# Patient Record
Sex: Female | Born: 1960 | Race: White | Hispanic: No | Marital: Married | Smoking: Never smoker
Health system: Southern US, Community
[De-identification: ages and names within clinical notes are randomized; demographics above are authoritative.]

## PROBLEM LIST (undated history)

## (undated) DIAGNOSIS — G4733 Obstructive sleep apnea (adult) (pediatric): Secondary | ICD-10-CM

## (undated) DIAGNOSIS — F988 Other specified behavioral and emotional disorders with onset usually occurring in childhood and adolescence: Secondary | ICD-10-CM

## (undated) DIAGNOSIS — I10 Essential (primary) hypertension: Secondary | ICD-10-CM

## (undated) DIAGNOSIS — G4730 Sleep apnea, unspecified: Secondary | ICD-10-CM

## (undated) DIAGNOSIS — D509 Iron deficiency anemia, unspecified: Secondary | ICD-10-CM

## (undated) DIAGNOSIS — N823 Fistula of vagina to large intestine: Secondary | ICD-10-CM

## (undated) DIAGNOSIS — J189 Pneumonia, unspecified organism: Secondary | ICD-10-CM

## (undated) DIAGNOSIS — Z9989 Dependence on other enabling machines and devices: Secondary | ICD-10-CM

## (undated) DIAGNOSIS — E785 Hyperlipidemia, unspecified: Secondary | ICD-10-CM

## (undated) DIAGNOSIS — E119 Type 2 diabetes mellitus without complications: Secondary | ICD-10-CM

## (undated) DIAGNOSIS — F430 Acute stress reaction: Secondary | ICD-10-CM

## (undated) DIAGNOSIS — K5790 Diverticulosis of intestine, part unspecified, without perforation or abscess without bleeding: Secondary | ICD-10-CM

## (undated) DIAGNOSIS — Z8742 Personal history of other diseases of the female genital tract: Secondary | ICD-10-CM

## (undated) DIAGNOSIS — R112 Nausea with vomiting, unspecified: Secondary | ICD-10-CM

## (undated) DIAGNOSIS — D219 Benign neoplasm of connective and other soft tissue, unspecified: Secondary | ICD-10-CM

## (undated) DIAGNOSIS — Z9889 Other specified postprocedural states: Secondary | ICD-10-CM

## (undated) DIAGNOSIS — D259 Leiomyoma of uterus, unspecified: Secondary | ICD-10-CM

## (undated) DIAGNOSIS — R030 Elevated blood-pressure reading, without diagnosis of hypertension: Secondary | ICD-10-CM

## (undated) DIAGNOSIS — K5900 Constipation, unspecified: Secondary | ICD-10-CM

## (undated) DIAGNOSIS — M2550 Pain in unspecified joint: Secondary | ICD-10-CM

## (undated) DIAGNOSIS — F419 Anxiety disorder, unspecified: Secondary | ICD-10-CM

## (undated) DIAGNOSIS — K219 Gastro-esophageal reflux disease without esophagitis: Secondary | ICD-10-CM

## (undated) DIAGNOSIS — R739 Hyperglycemia, unspecified: Secondary | ICD-10-CM

## (undated) DIAGNOSIS — E669 Obesity, unspecified: Secondary | ICD-10-CM

## (undated) DIAGNOSIS — M722 Plantar fascial fibromatosis: Secondary | ICD-10-CM

## (undated) DIAGNOSIS — R682 Dry mouth, unspecified: Secondary | ICD-10-CM

## (undated) DIAGNOSIS — N736 Female pelvic peritoneal adhesions (postinfective): Secondary | ICD-10-CM

## (undated) DIAGNOSIS — F458 Other somatoform disorders: Secondary | ICD-10-CM

## (undated) DIAGNOSIS — N83202 Unspecified ovarian cyst, left side: Secondary | ICD-10-CM

## (undated) DIAGNOSIS — N83201 Unspecified ovarian cyst, right side: Secondary | ICD-10-CM

## (undated) DIAGNOSIS — R7303 Prediabetes: Secondary | ICD-10-CM

## (undated) DIAGNOSIS — M549 Dorsalgia, unspecified: Secondary | ICD-10-CM

## (undated) DIAGNOSIS — N950 Postmenopausal bleeding: Secondary | ICD-10-CM

## (undated) DIAGNOSIS — N920 Excessive and frequent menstruation with regular cycle: Secondary | ICD-10-CM

## (undated) DIAGNOSIS — Z973 Presence of spectacles and contact lenses: Secondary | ICD-10-CM

## (undated) HISTORY — DX: Pain in unspecified joint: M25.50

## (undated) HISTORY — DX: Elevated blood-pressure reading, without diagnosis of hypertension: R03.0

## (undated) HISTORY — PX: HEMORRHOID BANDING: SHX5850

## (undated) HISTORY — DX: Unspecified ovarian cyst, right side: N83.202

## (undated) HISTORY — DX: Gastro-esophageal reflux disease without esophagitis: K21.9

## (undated) HISTORY — DX: Acute stress reaction: F43.0

## (undated) HISTORY — DX: Hyperlipidemia, unspecified: E78.5

## (undated) HISTORY — PX: SEPTOPLASTY: SUR1290

## (undated) HISTORY — PX: HEMORRHOID SURGERY: SHX153

## (undated) HISTORY — DX: Excessive and frequent menstruation with regular cycle: N92.0

## (undated) HISTORY — DX: Hyperglycemia, unspecified: R73.9

## (undated) HISTORY — DX: Plantar fascial fibromatosis: M72.2

## (undated) HISTORY — DX: Dorsalgia, unspecified: M54.9

## (undated) HISTORY — DX: Obesity, unspecified: E66.9

## (undated) HISTORY — DX: Constipation, unspecified: K59.00

## (undated) HISTORY — DX: Anxiety disorder, unspecified: F41.9

## (undated) HISTORY — DX: Other somatoform disorders: F45.8

## (undated) HISTORY — DX: Leiomyoma of uterus, unspecified: D25.9

## (undated) HISTORY — DX: Other specified behavioral and emotional disorders with onset usually occurring in childhood and adolescence: F98.8

## (undated) HISTORY — PX: UTERINE SUSPENSION: SUR1430

## (undated) HISTORY — DX: Sleep apnea, unspecified: G47.30

## (undated) HISTORY — DX: Type 2 diabetes mellitus without complications: E11.9

## (undated) HISTORY — DX: Obstructive sleep apnea (adult) (pediatric): G47.33

## (undated) HISTORY — PX: WISDOM TOOTH EXTRACTION: SHX21

## (undated) HISTORY — PX: OTHER SURGICAL HISTORY: SHX169

## (undated) HISTORY — DX: Dry mouth, unspecified: R68.2

## (undated) HISTORY — DX: Female pelvic peritoneal adhesions (postinfective): N73.6

## (undated) HISTORY — PX: LAPAROSCOPY: SHX197

## (undated) HISTORY — DX: Dependence on other enabling machines and devices: Z99.89

## (undated) HISTORY — DX: Unspecified ovarian cyst, right side: N83.201

## (undated) HISTORY — PX: TONSILLECTOMY AND ADENOIDECTOMY: SUR1326

## (undated) HISTORY — PX: TRIGGER FINGER RELEASE: SHX641

---

## 1998-07-19 ENCOUNTER — Other Ambulatory Visit: Admission: RE | Admit: 1998-07-19 | Discharge: 1998-07-19 | Payer: Self-pay | Admitting: *Deleted

## 2004-01-13 ENCOUNTER — Emergency Department (HOSPITAL_COMMUNITY): Admission: EM | Admit: 2004-01-13 | Discharge: 2004-01-13 | Payer: Self-pay | Admitting: Emergency Medicine

## 2004-01-18 ENCOUNTER — Encounter: Admission: RE | Admit: 2004-01-18 | Discharge: 2004-01-18 | Payer: Self-pay | Admitting: Sports Medicine

## 2004-09-12 ENCOUNTER — Ambulatory Visit: Payer: Self-pay | Admitting: Family Medicine

## 2004-10-24 ENCOUNTER — Ambulatory Visit: Payer: Self-pay | Admitting: Family Medicine

## 2004-11-28 ENCOUNTER — Ambulatory Visit: Payer: Self-pay | Admitting: Family Medicine

## 2005-01-23 ENCOUNTER — Ambulatory Visit: Payer: Self-pay | Admitting: Family Medicine

## 2005-05-14 ENCOUNTER — Ambulatory Visit: Payer: Self-pay | Admitting: Family Medicine

## 2005-09-04 ENCOUNTER — Ambulatory Visit: Payer: Self-pay | Admitting: Internal Medicine

## 2005-09-18 ENCOUNTER — Ambulatory Visit: Payer: Self-pay | Admitting: Family Medicine

## 2006-03-05 ENCOUNTER — Ambulatory Visit: Payer: Self-pay | Admitting: Family Medicine

## 2006-04-16 ENCOUNTER — Ambulatory Visit: Payer: Self-pay | Admitting: Family Medicine

## 2006-07-16 ENCOUNTER — Ambulatory Visit: Payer: Self-pay | Admitting: Family Medicine

## 2006-08-27 ENCOUNTER — Ambulatory Visit: Payer: Self-pay | Admitting: Pulmonary Disease

## 2006-09-23 ENCOUNTER — Ambulatory Visit (HOSPITAL_BASED_OUTPATIENT_CLINIC_OR_DEPARTMENT_OTHER): Admission: RE | Admit: 2006-09-23 | Discharge: 2006-09-23 | Payer: Self-pay | Admitting: Pulmonary Disease

## 2006-09-28 ENCOUNTER — Ambulatory Visit: Payer: Self-pay | Admitting: Pulmonary Disease

## 2006-10-08 ENCOUNTER — Ambulatory Visit: Payer: Self-pay | Admitting: Pulmonary Disease

## 2006-10-14 ENCOUNTER — Telehealth (INDEPENDENT_AMBULATORY_CARE_PROVIDER_SITE_OTHER): Payer: Self-pay | Admitting: *Deleted

## 2006-11-05 ENCOUNTER — Ambulatory Visit: Payer: Self-pay | Admitting: Pulmonary Disease

## 2006-12-10 DIAGNOSIS — F988 Other specified behavioral and emotional disorders with onset usually occurring in childhood and adolescence: Secondary | ICD-10-CM

## 2006-12-10 HISTORY — DX: Other specified behavioral and emotional disorders with onset usually occurring in childhood and adolescence: F98.8

## 2007-01-21 ENCOUNTER — Encounter: Payer: Self-pay | Admitting: Family Medicine

## 2007-01-27 ENCOUNTER — Encounter: Payer: Self-pay | Admitting: Family Medicine

## 2007-01-28 ENCOUNTER — Telehealth (INDEPENDENT_AMBULATORY_CARE_PROVIDER_SITE_OTHER): Payer: Self-pay | Admitting: *Deleted

## 2007-02-09 ENCOUNTER — Encounter (INDEPENDENT_AMBULATORY_CARE_PROVIDER_SITE_OTHER): Payer: Self-pay | Admitting: *Deleted

## 2007-03-28 ENCOUNTER — Telehealth (INDEPENDENT_AMBULATORY_CARE_PROVIDER_SITE_OTHER): Payer: Self-pay | Admitting: *Deleted

## 2007-03-29 DIAGNOSIS — J4520 Mild intermittent asthma, uncomplicated: Secondary | ICD-10-CM | POA: Insufficient documentation

## 2007-03-29 DIAGNOSIS — J45909 Unspecified asthma, uncomplicated: Secondary | ICD-10-CM

## 2007-03-29 DIAGNOSIS — N809 Endometriosis, unspecified: Secondary | ICD-10-CM | POA: Insufficient documentation

## 2007-08-17 ENCOUNTER — Telehealth: Payer: Self-pay | Admitting: Family Medicine

## 2007-09-09 ENCOUNTER — Telehealth: Payer: Self-pay | Admitting: Family Medicine

## 2007-10-07 ENCOUNTER — Telehealth: Payer: Self-pay | Admitting: Family Medicine

## 2007-10-11 ENCOUNTER — Encounter: Payer: Self-pay | Admitting: Family Medicine

## 2007-10-19 ENCOUNTER — Encounter (INDEPENDENT_AMBULATORY_CARE_PROVIDER_SITE_OTHER): Payer: Self-pay | Admitting: *Deleted

## 2007-11-18 ENCOUNTER — Ambulatory Visit: Payer: Self-pay | Admitting: Family Medicine

## 2007-11-18 DIAGNOSIS — E6609 Other obesity due to excess calories: Secondary | ICD-10-CM | POA: Insufficient documentation

## 2007-11-18 DIAGNOSIS — E669 Obesity, unspecified: Secondary | ICD-10-CM | POA: Insufficient documentation

## 2008-02-07 ENCOUNTER — Encounter: Payer: Self-pay | Admitting: Family Medicine

## 2008-02-21 ENCOUNTER — Ambulatory Visit: Payer: Self-pay | Admitting: Family Medicine

## 2008-03-02 ENCOUNTER — Ambulatory Visit: Payer: Self-pay | Admitting: Family Medicine

## 2008-03-02 DIAGNOSIS — I10 Essential (primary) hypertension: Secondary | ICD-10-CM | POA: Insufficient documentation

## 2008-03-05 LAB — CONVERTED CEMR LAB
Alkaline Phosphatase: 59 units/L (ref 39–117)
BUN: 10 mg/dL (ref 6–23)
Basophils Absolute: 0 10*3/uL (ref 0.0–0.1)
Chloride: 105 meq/L (ref 96–112)
Eosinophils Relative: 1.2 % (ref 0.0–5.0)
GFR calc Af Amer: 115 mL/min
HCT: 32.5 % — ABNORMAL LOW (ref 36.0–46.0)
HDL: 50.7 mg/dL (ref >39.0)
Hgb A1c MFr Bld: 6.5 % — ABNORMAL HIGH (ref 4.6–6.0)
MCV: 80.4 fL (ref 78.0–100.0)
Neutrophils Relative %: 64.2 % (ref 43.0–77.0)
Platelets: 347 10*3/uL (ref 150–400)
Potassium: 3.4 meq/L — ABNORMAL LOW (ref 3.5–5.1)
RBC: 4.04 M/uL (ref 3.87–5.11)
RDW: 15.5 % — ABNORMAL HIGH (ref 11.5–14.6)
Sodium: 142 meq/L (ref 135–145)
TSH: 0.99 microintl units/mL (ref 0.35–5.50)
Total Bilirubin: 0.7 mg/dL (ref 0.3–1.2)
Total CHOL/HDL Ratio: 3.6
Total Protein: 6.8 g/dL (ref 6.0–8.3)
VLDL: 27 mg/dL (ref 0–40)
WBC: 11 10*3/uL — ABNORMAL HIGH (ref 4.5–10.5)

## 2008-05-02 ENCOUNTER — Encounter: Payer: Self-pay | Admitting: Family Medicine

## 2008-06-08 ENCOUNTER — Ambulatory Visit: Payer: Self-pay | Admitting: Family Medicine

## 2008-06-19 ENCOUNTER — Ambulatory Visit: Payer: Self-pay | Admitting: Family Medicine

## 2008-06-27 ENCOUNTER — Encounter: Payer: Self-pay | Admitting: Pulmonary Disease

## 2008-07-20 ENCOUNTER — Ambulatory Visit: Payer: Self-pay | Admitting: Family Medicine

## 2008-07-20 DIAGNOSIS — E785 Hyperlipidemia, unspecified: Secondary | ICD-10-CM | POA: Insufficient documentation

## 2008-07-20 DIAGNOSIS — D649 Anemia, unspecified: Secondary | ICD-10-CM | POA: Insufficient documentation

## 2008-09-21 ENCOUNTER — Ambulatory Visit: Payer: Self-pay | Admitting: Family Medicine

## 2008-09-23 LAB — CONVERTED CEMR LAB
Albumin: 3.4 g/dL — ABNORMAL LOW (ref 3.5–5.2)
CO2: 29 meq/L (ref 19–32)
Chloride: 112 meq/L (ref 96–112)
Cholesterol: 201 mg/dL — ABNORMAL HIGH (ref 0–200)
HDL: 49.5 mg/dL (ref >39.00)
Microalb, Ur: 0.6 mg/dL (ref 0.0–1.9)
Phosphorus: 3.1 mg/dL (ref 2.3–4.6)
Potassium: 3.9 meq/L (ref 3.5–5.1)
Sodium: 145 meq/L (ref 135–145)
Triglycerides: 77 mg/dL (ref 0.0–149.0)
VLDL: 15.4 mg/dL (ref 0.0–40.0)

## 2008-10-05 ENCOUNTER — Ambulatory Visit: Payer: Self-pay | Admitting: Family Medicine

## 2008-11-08 LAB — HM DIABETES EYE EXAM: HM Diabetic Eye Exam: NORMAL

## 2008-11-09 ENCOUNTER — Ambulatory Visit: Payer: Self-pay | Admitting: Internal Medicine

## 2008-11-09 DIAGNOSIS — K219 Gastro-esophageal reflux disease without esophagitis: Secondary | ICD-10-CM | POA: Insufficient documentation

## 2008-11-13 LAB — CONVERTED CEMR LAB
Basophils Absolute: 0 10*3/uL (ref 0.0–0.1)
Eosinophils Absolute: 0.1 10*3/uL (ref 0.0–0.7)
Eosinophils Relative: 1.4 % (ref 0.0–5.0)
Folate: 15.4 ng/mL
HCT: 34.9 % — ABNORMAL LOW (ref 36.0–46.0)
Hemoglobin: 11.8 g/dL — ABNORMAL LOW (ref 12.0–15.0)
MCHC: 33.7 g/dL (ref 30.0–36.0)
MCV: 81.7 fL (ref 78.0–100.0)
Monocytes Relative: 8.1 % (ref 3.0–12.0)
Neutro Abs: 5.1 10*3/uL (ref 1.4–7.7)
RBC: 4.27 M/uL (ref 3.87–5.11)
WBC: 7.9 10*3/uL (ref 4.5–10.5)

## 2008-12-14 ENCOUNTER — Ambulatory Visit: Payer: Self-pay | Admitting: Family Medicine

## 2008-12-17 LAB — CONVERTED CEMR LAB
AST: 21 units/L (ref 0–37)
HDL: 47.4 mg/dL (ref >39.00)
LDL Cholesterol: 129 mg/dL — ABNORMAL HIGH (ref 0–99)
Total CHOL/HDL Ratio: 4
VLDL: 10.6 mg/dL (ref 0.0–40.0)

## 2008-12-21 ENCOUNTER — Ambulatory Visit: Payer: Self-pay | Admitting: Family Medicine

## 2008-12-24 ENCOUNTER — Telehealth: Payer: Self-pay | Admitting: Family Medicine

## 2009-01-09 HISTORY — PX: UPPER GI ENDOSCOPY: SHX6162

## 2009-01-09 HISTORY — PX: COLONOSCOPY: SHX174

## 2009-01-25 ENCOUNTER — Ambulatory Visit: Payer: Self-pay | Admitting: Internal Medicine

## 2009-01-31 ENCOUNTER — Telehealth: Payer: Self-pay | Admitting: Internal Medicine

## 2009-02-01 ENCOUNTER — Encounter: Payer: Self-pay | Admitting: Internal Medicine

## 2009-02-01 ENCOUNTER — Ambulatory Visit: Payer: Self-pay | Admitting: Internal Medicine

## 2009-02-04 ENCOUNTER — Encounter: Payer: Self-pay | Admitting: Internal Medicine

## 2009-02-22 ENCOUNTER — Encounter: Payer: Self-pay | Admitting: Family Medicine

## 2009-03-01 ENCOUNTER — Encounter (INDEPENDENT_AMBULATORY_CARE_PROVIDER_SITE_OTHER): Payer: Self-pay | Admitting: *Deleted

## 2009-03-26 ENCOUNTER — Ambulatory Visit: Payer: Self-pay | Admitting: Family Medicine

## 2009-04-26 ENCOUNTER — Encounter: Payer: Self-pay | Admitting: Family Medicine

## 2009-06-10 ENCOUNTER — Telehealth: Payer: Self-pay | Admitting: Family Medicine

## 2009-06-10 DIAGNOSIS — G4733 Obstructive sleep apnea (adult) (pediatric): Secondary | ICD-10-CM | POA: Insufficient documentation

## 2009-06-28 ENCOUNTER — Encounter: Payer: Self-pay | Admitting: Family Medicine

## 2009-08-16 ENCOUNTER — Ambulatory Visit: Payer: Self-pay | Admitting: Family Medicine

## 2009-08-18 LAB — CONVERTED CEMR LAB
Basophils Absolute: 0 10*3/uL (ref 0.0–0.1)
Eosinophils Absolute: 0.2 10*3/uL (ref 0.0–0.7)
HCT: 35.7 % — ABNORMAL LOW (ref 36.0–46.0)
Hemoglobin: 12.2 g/dL (ref 12.0–15.0)
Lymphs Abs: 2.2 10*3/uL (ref 0.7–4.0)
Microalb, Ur: 1 mg/dL (ref 0.0–1.9)
Monocytes Relative: 7.5 % (ref 3.0–12.0)
Neutro Abs: 3.8 10*3/uL (ref 1.4–7.7)
Neutrophils Relative %: 56.1 % (ref 43.0–77.0)
Platelets: 291 10*3/uL (ref 150.0–400.0)
RBC: 4 M/uL (ref 3.87–5.11)
TSH: 1.96 microintl units/mL (ref 0.35–5.50)
WBC: 6.8 10*3/uL (ref 4.5–10.5)

## 2009-08-23 ENCOUNTER — Ambulatory Visit: Payer: Self-pay | Admitting: Family Medicine

## 2009-09-02 ENCOUNTER — Telehealth (INDEPENDENT_AMBULATORY_CARE_PROVIDER_SITE_OTHER): Payer: Self-pay | Admitting: *Deleted

## 2009-12-12 ENCOUNTER — Telehealth: Payer: Self-pay | Admitting: Family Medicine

## 2010-02-21 ENCOUNTER — Ambulatory Visit: Payer: Self-pay | Admitting: Family Medicine

## 2010-02-23 LAB — CONVERTED CEMR LAB
AST: 17 units/L (ref 0–37)
BUN: 14 mg/dL (ref 6–23)
Calcium: 8.8 mg/dL (ref 8.4–10.5)
Cholesterol: 172 mg/dL (ref 0–200)
Creatinine, Ser: 0.7 mg/dL (ref 0.4–1.2)
GFR calc non Af Amer: 89.72 mL/min (ref >60)
Glucose, Bld: 106 mg/dL — ABNORMAL HIGH (ref 70–99)
HDL: 54.1 mg/dL (ref >39.00)
LDL Cholesterol: 98 mg/dL (ref 0–99)
Phosphorus: 3.1 mg/dL (ref 2.3–4.6)
Triglycerides: 100 mg/dL (ref 0.0–149.0)
VLDL: 20 mg/dL (ref 0.0–40.0)

## 2010-02-28 ENCOUNTER — Ambulatory Visit: Payer: Self-pay | Admitting: Family Medicine

## 2010-02-28 DIAGNOSIS — M79609 Pain in unspecified limb: Secondary | ICD-10-CM | POA: Insufficient documentation

## 2010-02-28 LAB — HM MAMMOGRAPHY: HM Mammogram: NORMAL

## 2010-03-07 ENCOUNTER — Encounter: Payer: Self-pay | Admitting: Family Medicine

## 2010-04-11 ENCOUNTER — Encounter: Payer: Self-pay | Admitting: Family Medicine

## 2010-04-11 ENCOUNTER — Ambulatory Visit: Payer: Self-pay | Admitting: Sports Medicine

## 2010-04-11 DIAGNOSIS — M722 Plantar fascial fibromatosis: Secondary | ICD-10-CM | POA: Insufficient documentation

## 2010-04-11 DIAGNOSIS — M7750 Other enthesopathy of unspecified foot: Secondary | ICD-10-CM | POA: Insufficient documentation

## 2010-05-16 ENCOUNTER — Ambulatory Visit
Admission: RE | Admit: 2010-05-16 | Discharge: 2010-05-16 | Payer: Self-pay | Source: Home / Self Care | Attending: Family Medicine | Admitting: Family Medicine

## 2010-05-30 ENCOUNTER — Ambulatory Visit: Admit: 2010-05-30 | Payer: Self-pay | Admitting: Family Medicine

## 2010-06-12 NOTE — Progress Notes (Signed)
Summary: Records request from [facility]  Request for records received from [facility]. Request forwarded to [facility]. Wilder Glade  September 02, 2009 4:29 PM

## 2010-06-12 NOTE — Progress Notes (Signed)
Summary: regarding advair  Phone Note From Pharmacy   Caller: [facility]* Summary of Call: [facility] is calling to clarify directions on advair.  Directions on  pt's script call for one to two puffs once a day.  Pharmacist says usual dosage is one puff twice a day.  Please advise.  [facility]'s phone is [phone].  Reference [id number]. Initial call taken by: Lowella Petties CMA,  December 12, 2009 12:00 PM  Follow-up for Phone Call        it should read in inhalation two times a day  I will do new px to fax Follow-up by: Judith Part MD,  December 12, 2009 1:04 PM  Additional Follow-up for Phone Call Additional follow up Details #1::        called [facility] spoke with [facility]. New rx given.Lewanda Rife LPN  December 12, 2009 2:12 PM     New/Updated Medications: ADVAIR DISKUS 100-50 MCG/DOSE MISC (FLUTICASONE-SALMETEROL) 1 inhalation two times a day Prescriptions: ADVAIR DISKUS 100-50 MCG/DOSE MISC (FLUTICASONE-SALMETEROL) 1 inhalation two times a day  #3 diskus x 3   Entered and Authorized by:   Judith Part MD   Signed by:   Judith Part MD on 12/12/2009   Method used:   Printed then faxed to ...       [facility]* (retail)             ,          Ph: [phone]       Fax: [phone]   RxID:   [id number]

## 2010-06-12 NOTE — Assessment & Plan Note (Signed)
Summary: NP,ORTHOTIC,FOOT PAIN,MC   Vital Signs:  Patient profile:   50 year old female Height:      64.5 inches Weight:      216 pounds BMI:     36.64 Pulse rate:   76 / minute BP sitting:   136 / 91  Vitals Entered By: Rochele Pages RN (April 11, 2010 8:53 AM) CC: Left plantar fasciitis   Referring Provider:  Roxy Manns, MD Primary Provider:  Roxy Manns, MD  CC:  Left plantar fasciitis.  History of Present Illness: 50 yo F here for foot pain and orthotic eval.  Referred by Dr. Milinda Antis. States PF on Lt foot started 2 years ago.  Has had at least 2 CSI shots by podiatrist with minimal response.  Also tried 2-3 different orthotics, but still has "arch pain" on almost daily basis.  Over last few months, has tried new Dr. Margart Sickles inserts with current boot inserts which work well for her.  Has developed some metatarsalgia pain on 2nd-3rd toes on Rt side as well.  At one point her pain was do bad that it radiated up to her knees and hips and was unable to walk.  Was needing a scooter at the grocery store. 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 Interestingly she denies AM first step pain. Likes walking for exercise, but unable to do much now.  Looking at her prior insoles, they have very hard/rigid heel supports.  Preventive Screening-Counseling & Management  Alcohol-Tobacco     Smoking Status: never  Allergies: No Known Drug Allergies  Past History:  Past Medical History: Last updated: 08/23/2009 obesity stress reaction elevated bp without dx HTN endometriosis GERD (8/08) ADD (adult) asthma hyperlipidemia mild DM 2 obstructive sleep apnea (CPAP) plantar fasciitis with orthotics -- much difficulty adjusting these   GYN - [facility]  Donavan Burnet  Past Surgical History: Last updated: 11/08/2008 Hemorrhoidectomy uterine suspension R wrist fx laproscopy--endometriosis  Family History: Last updated: 11/09/2008 some family  is in [place] father - prostate cancer  mother? 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  The patient denies fever, weight loss, and unusual weight change.    Physical Exam  General:  overweight-appearing.   Head:  normocephalic.   Eyes:  vision grossly intact.   Nose:  no external deformity.   Neck:  supple.   Lungs:  normal respiratory effort.   Abdomen:  soft.   Msk:  Knees: FROm Hips: FROM  Lt foot: flexible pes cavus with arch collapse on standing.  + mild medial calc ttp at PF insertion.  + transv arch collapse with overpronation.  No MT ttp or AT ttp.  No abnl callus.  Mild calc valgus. Mild 1st MTP bunion.  Rt foot: flexible pes cavus with long and transv arch collapse.  No PF insertion ttp or AT ttp.  + obvious hindfoot valgus.  + ttp over 2nd MT head.  + 1st MTP bunion with subluxing 2nd toe.  Gait: overpronation at ankles  [place]: Lt PF 0.43 cm width, + mild plantar fibroma a 0.3 cm.  Lt AT 0.46 cm Rt PF 0.43 cm with no evidence of plantar fibroma, AT nl width.  Rt 2nd MTP with mild spurring and bursitis.   Impression & Recommendations:  Problem # 1:  FOOT PAIN (ICD-729.5)  Lt foot because of some chronic PF with some fibromatosis.  Rt foot more from metatarsalgia.  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  Based on [place], CSI will likely not benefit her. - gave her 2nd pair of green sports  insoles to use in other shoes - trial of arch straps for arch support - given handout and reviewed specific PF stretches. - see pt instructions - f/u 1 month, sooner if need - may be candidate for custom orthotics in future if desires with softer heel support  Orders: [place] LIMITED ([id number]) Sports Insoles (L3510) Foot Orthosis ( Arch Strap/Heel Cup) [phone])  Problem # 3:  METATARSALGIA (ICD-726.70)  this is caused by foot breakdown and rotation of 2nd MT. In addition, has mild spurring/arthritis in 2nd MTP. - trial of MT pad on Rt insole only.  We placed these on green insole and current OTC insole.  They were not causing pain, but she is concerned they will in future and is worreid about using them for more than 2-3 hours.  I encouraged to give them a good trial - arch straps as previously stated - rehab exercises - f/u 1 month, ok for sooner if in pain  Orders: [place] LIMITED ([id number]) Sports Insoles [phone]) Foot Orthosis ( Arch Strap/Heel Cup) [phone])  Complete Medication List: 1)  Zegerid 40-1100 Mg Caps (Omeprazole-sodium bicarbonate) .... Take 1 capsule by mouth at bedtime 2)  Advair Diskus 100-50 Mcg/dose Misc (Fluticasone-salmeterol) .Marland Kitchen.. 1 inhalation two times a day 3)  Ortho Tri-cyclen Lo 0.025 Mg Tabs (Norgestimate-ethinyl estradiol) .... Take 1 tablet by mouth once a day 4)  Proventil Hfa 108 (90 Base) Mcg/act Aers (Albuterol sulfate) .... 2 puffs up to every 4 hours as needed for wheezing 5)  Cozaar 50 Mg Tabs (Losartan potassium) .Marland Kitchen.. 1 by mouth once daily 6)  Multivitamins Tabs (Multiple vitamin) .... One tablet by mouth once daily 7)  Iron 325 (65 Fe) Mg Tabs (Ferrous sulfate) .Marland Kitchen.. 1 by mouth daily 8)  Iodoral 12.5mg   .... Take one every day. (otc) 9)  Massage Therapy  .... For chronic back pain treatment 1-2 times per month  Patient Instructions: 1)  Please schedule a follow-up appointment in 1 month. 2)  Do your plantar fascia stretch exercises. 3)  it's ok to adjust  the metatarsal pads if you need to. 4)  Try rolling a ice water bottle or golf ball under your plantar fascia   Orders Added: 1)  Consultation Level III [99243] 2)  [place] LIMITED [[id number]] 3)  Sports Insoles [L3510] 4)  Foot Orthosis ( Arch Strap/Heel Cup) [M8413]

## 2010-06-12 NOTE — Progress Notes (Signed)
Summary: ? regarding patients diagnosis  Phone Note Call from Patient Call back at Work Phone [phone]   Caller: Patient Call For: Judith Part MD Summary of Call: Patient called and stated that she needs some specific information regarding her diagnosis and dates of diagnosis for some insurance paper work.  Most of all she wants to know if she has diabetes and if so when was she diagnosed with diabetes.  Please advise.  Initial call taken by: Linde Gillis CMA Duncan Dull),  June 10, 2009 5:00 PM  Follow-up for Phone Call        yes- first dx was 07/20/2008 - with hemoglobin AIC of 6.5 let me know if she needs other info  Follow-up by: Judith Part MD,  June 10, 2009 5:09 PM  Additional Follow-up for Phone Call Additional follow up Details #1::        Left message at her job for her to return call Additional Follow-up by: Linde Gillis CMA Duncan Dull),  June 11, 2009 2:27 PM  New Problems: SLEEP APNEA (ICD-780.57)   Additional Follow-up for Phone Call Additional follow up Details #2::    Spoke with patient and gave onset dates for all of the problems/diagnosis she is or have been treated for.  Patient says that the onset of sleep apnea is incorrect.  She says that she has been diagnosed with sleep apnea for more than 4 years.  Was diagnosed by a Pulmonologist in the [place] [facility].  Linde Gillis CMA Duncan Dull)  June 11, 2009 4:20 PM   sorry- I had to add sleep apnea today-- because it was not on problem list -- (due to fact it pre- existed before EMR)- please correct date of onset, thanks Follow-up by: Judith Part MD,  June 11, 2009 4:29 PM  Additional Follow-up for Phone Call Additional follow up Details #3:: Details for Additional Follow-up Action Taken: Dr. Milinda Antis, I have looked in pt's paper chart and can't find mention of sleep apnea.  I don't know what to change the date of onset too.  Please advise.  Chart is on your desk.  Lowella Petties CMA  June 12, 2009  2:50 PM  I cannot find it in her chart- she may have to call the pulm office for that info-- thanks -- MT  Additional Follow-up by: Judith Part MD,  June 12, 2009 3:36 PM  New Problems: SLEEP APNEA (ICD-780.57)

## 2010-06-12 NOTE — Assessment & Plan Note (Signed)
Summary: BAD COLD, HOARSENESS  CYD   Vital Signs:  Patient profile:   50 year old female Weight:      223 pounds BMI:     37.82 Temp:     98.1 degrees F oral Pulse rate:   84 / minute Pulse rhythm:   regular BP sitting:   112 / 66  (left arm) Cuff size:   large  Vitals Entered By: Lowella Petties CMA, AAMA (May 16, 2010 3:25 PM) CC: Chest congestion, hoarse.  Symptoms for over a week.   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 [place]     her spouse is sick with simlar symptoms     walking is still hurting feet -- and now hip pain  also arthritis in 2nd toe    Allergies: No Known Drug Allergies  Past History:  Past Medical History: Last updated: 08/23/2009 obesity stress reaction elevated bp without dx HTN endometriosis GERD (8/08) ADD (adult) asthma hyperlipidemia mild DM 2 obstructive sleep apnea (CPAP) plantar fasciitis with orthotics -- much difficulty adjusting these   GYN - [facility]  Donavan Burnet  Past Surgical History: Last updated: 11/08/2008 Hemorrhoidectomy uterine suspension R wrist fx laproscopy--endometriosis  Family History: Last updated: 11/09/2008 some family is in [place] father - prostate cancer  mother? 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 Eyes:  Denies blurring, discharge, and eye pain. ENT:  Complains of hoarseness, nasal congestion, postnasal drainage, and sore throat. CV:  Denies chest pain or discomfort and palpitations. Resp:  Complains of sputum productive; denies pleuritic, shortness of breath, and wheezing. GI:  Denies diarrhea, nausea, and vomiting. Derm:  Denies rash.  Physical Exam  General:  overweight but generally well appearing  Head:  normocephalic, atraumatic, and no abnormalities observed.  no sinus tenderness  Eyes:  vision grossly intact, pupils equal, pupils round, and pupils reactive to light.  no conjunctival pallor, injection or icterus  Ears:  R ear normal and L ear normal.   Nose:  nares are injected and congested bilaterally  Mouth:  pharynx pink and moist and no erythema.  some clear post nasal drip is hoarse  Neck:  No deformities, masses, or tenderness noted. Chest Wall:  No deformities, masses, or tenderness noted. Lungs:  CTA with harsh bs at bases no wheeze even on forced exp no rales or rhonchi Heart:  Normal rate and regular rhythm. S1 and S2 normal without gallop, murmur, click, rub or other extra sounds. Skin:  Intact without suspicious lesions or rashes Cervical Nodes:  No lymphadenopathy noted Psych:  normal affect, talkative and pleasant    Impression &  Recommendations:  Problem # 1:  ACUTE BRONCHITIS (ICD-466.0) viral with hoarseness and cough  given px to hold for zpak and prednisone in case worsens over wkend in light of hx of asthma and bad resp infx in past pt advised to update me if symptoms worsen or do not improve recommend sympt care- see pt instructions   Her updated medication list for this problem includes:    Advair Diskus 100-50 Mcg/dose Misc (Fluticasone-salmeterol) .Marland Kitchen... 1 inhalation two times a day    Proventil Hfa 108 (90  Base) Mcg/act Aers (Albuterol sulfate) .Marland Kitchen... 2 puffs up to every 4 hours as needed for wheezing    Zithromax Z-pak 250 Mg Tabs (Azithromycin) .Marland Kitchen... Take by mouth as directed  Complete Medication List: 1)  Zegerid 40-1100 Mg Caps (Omeprazole-sodium bicarbonate) .... Take 1 capsule by mouth at bedtime 2)  Advair Diskus 100-50 Mcg/dose Misc (Fluticasone-salmeterol) .Marland Kitchen.. 1 inhalation two times a day 3)  Ortho Tri-cyclen Lo 0.025 Mg Tabs (Norgestimate-ethinyl estradiol) .... Take 1 tablet by mouth once a day 4)  Proventil Hfa 108 (90 Base) Mcg/act Aers (Albuterol sulfate) .... 2 puffs up to every 4 hours as needed for wheezing 5)  Cozaar 50 Mg Tabs (Losartan potassium) .Marland Kitchen.. 1 by mouth once daily 6)  Multivitamins Tabs (Multiple vitamin) .... One tablet by mouth once daily 7)  Iron 325 (65 Fe) Mg Tabs (Ferrous sulfate) .Marland Kitchen.. 1 by mouth daily 8)  Iodoral 12.5mg   .... Take one every day. (otc) 9)  Massage Therapy  .... For chronic back pain treatment 1-2 times per month 10)  Zithromax Z-pak 250 Mg Tabs (Azithromycin) .... Take by mouth as directed 11)  Prednisone 10 Mg Tabs (Prednisone) .... Take by mouth as directed  Patient Instructions: 1)  mucinex dm is a good option for cough and congestion  2)  drink lots of fluids  3)  rest when you can  4)  if worse cough/ productive/ fever- fill zpak 5)  if worse wheezing- take prednisone taper as follow  6)  3 pills once daily for 3 days then 7)  2 pills once daily for 3 days then 8)  1 pill once daily for 3 days then stop 9)  update me if not improved  Prescriptions: PREDNISONE 10 MG TABS (PREDNISONE) take by mouth as directed  #18 x 0   Entered and Authorized by:   Judith Part MD   Signed by:   Judith Part MD on 05/16/2010   Method used:   Print then Give to Patient   RxID:   [id number] ZITHROMAX Z-PAK 250 MG TABS (AZITHROMYCIN) take by mouth as directed  #1 pack x 0   Entered and Authorized by:   Judith Part MD   Signed by:    Judith Part MD on 05/16/2010   Method used:   Print then Give to Patient   RxID:   [id number]    Orders Added: 1)  Est. Patient Level III [84696]    Prior Medications (reviewed today): ZEGERID 40-1100 MG  CAPS (OMEPRAZOLE-SODIUM BICARBONATE) Take 1 capsule by mouth at bedtime ADVAIR DISKUS 100-50 MCG/DOSE MISC (FLUTICASONE-SALMETEROL) 1 inhalation two times a day ORTHO TRI-CYCLEN LO 0.025 MG  TABS (NORGESTIMATE-ETHINYL ESTRADIOL) Take 1 tablet by mouth once a day PROVENTIL HFA 108 (90 BASE) MCG/ACT AERS (ALBUTEROL SULFATE) 2 puffs up to every 4 hours as needed for wheezing COZAAR 50 MG TABS (LOSARTAN POTASSIUM) 1 by mouth once daily MULTIVITAMINS   TABS (MULTIPLE VITAMIN) one tablet  by mouth once daily IRON 325 (65 FE) MG TABS (FERROUS SULFATE) 1 by mouth daily IODORAL 12.5MG  () Take one every day. (OTC) MASSAGE THERAPY () for chronic back pain treatment 1-2 times per month ZITHROMAX Z-PAK 250 MG TABS (AZITHROMYCIN) take by mouth as directed PREDNISONE 10 MG TABS (PREDNISONE) take by mouth as directed Current Allergies: No known allergies

## 2010-06-12 NOTE — Assessment & Plan Note (Signed)
Summary: 6 MO. F/U/BIR   Vital Signs:  Patient profile:   50 year old female Height:      64.5 inches Weight:      215.50 pounds BMI:     36.55 Temp:     98.5 degrees F oral Pulse rate:   72 / minute Pulse rhythm:   regular BP sitting:   110 / 70  (left arm) Cuff size:   large  Vitals Entered By: Lewanda Rife LPN (August 23, 2009 8:49 AM) CC: six month follow up after labs   History of Present Illness: here for 6 mo f/u of HTN and DM and anemia   wt is down 1 lb  (pt gained and then lost 5 lb)    AIC down to 5.9 and nl microalb she went ahead and got the book sugar busters  now has changed her diet a lot in terms of sugar and carbs  is allowing 40 g carbs - this is difficult - takes longer to shop    last lipids LDL down to 129-- was 128 on   just started walking again  hurt her legs wearing bad orthotics - hurt her legs and hips  now uses Dr Vianne Bulls glycerin pads   no anemia on cbc   work is stressful -- but getting better - and building new office     Allergies (verified): No Known Drug Allergies  Past History:  Past Surgical History: Last updated: 11/08/2008 Hemorrhoidectomy uterine suspension R wrist fx laproscopy--endometriosis  Family History: Last updated: 11/09/2008 some family is in [place] father - prostate cancer  mother? 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 Eyes:  Denies blurring and eye pain. CV:  Denies chest pain or discomfort, lightheadness, palpitations, and shortness of breath with exertion. Resp:  Denies cough and wheezing. GI:  Denies abdominal pain, bloody stools, change in bowel habits, and indigestion. GU:  Denies urinary frequency. MS:  Denies muscle aches; foot pain is finally better - can walk. Derm:  Denies itching, lesion(s), poor wound healing, and rash. Neuro:  Denies numbness and tingling. Psych:  mood has been fairly good . Endo:  Denies excessive thirst and excessive urination. Heme:  Denies abnormal bruising and bleeding.  Physical Exam  General:  overweight but generally well appearing  Head:  normocephalic, atraumatic, and no abnormalities observed.   Eyes:  vision grossly intact, pupils equal, pupils round, pupils reactive to light, and no injection.   Neck:  supple with full rom and no masses or thyromegally, no JVD or carotid bruit  Lungs:  CTA with good air exch no wheeze today no crackles  Heart:  Normal rate and regular rhythm. S1 and S2 normal without gallop, murmur, click, rub or other extra sounds. Abdomen:  Bowel sounds positive,abdomen soft  and non-tender without masses, organomegaly or hernias noted. no renal bruits Msk:  No deformity or scoliosis noted of thoracic or lumbar spine.   Pulses:  R and L carotid,radial,femoral,dorsalis pedis and posterior tibial pulses are full and equal bilaterally Extremities:  No clubbing, cyanosis, edema, or deformity noted with normal full range of motion of all joints.   Neurologic:  sensation intact to light touch, gait normal, and DTRs symmetrical and normal.   Skin:  Intact without suspicious lesions or rashes Cervical Nodes:  No lymphadenopathy noted Inguinal Nodes:  No significant adenopathy Psych:   normal affect, talkative and pleasant   Diabetes Management Exam:    Foot Exam (with socks and/or shoes not present):       Sensory-Pinprick/Light touch:          Left medial foot (L-4): normal          Left dorsal foot (L-5): normal          Left lateral foot (S-1): normal          Right medial foot (L-4): normal          Right dorsal foot (L-5): normal          Right lateral foot (S-1): normal       Sensory-Monofilament:          Left foot: normal          Right foot: normal       Inspection:          Left foot: normal          Right foot: normal       Nails:          Left foot: normal          Right foot: normal   Impression & Recommendations:  Problem # 1:  DIABETES-TYPE 2 (ICD-250.00) Assessment Improved  better with AIC now below 6 with lower carb diet commended effort  exp wt loss with walking now lab / f/u in 6 mo  Her updated medication list for this problem includes:    Cozaar 50 Mg Tabs (Losartan potassium) .Marland Kitchen... 1 by mouth once daily  Labs Reviewed: Creat: 0.6 (09/21/2008)     Last Eye Exam: normal (11/08/2008) Reviewed HgBA1c results: 5.9 (08/16/2009)  6.0 (12/14/2008)  Problem # 2:  HYPERLIPIDEMIA (ICD-272.4) Assessment: Unchanged  stable with LDL in 120s  disc low sat fat diet - re check in 6 mo  rev her labs from the insurance co if [facility] is over 6 -- our LDL goal will change  for now fair- but keep working on it - I would prefer LDL at 100   Labs Reviewed: SGOT: 21 (12/14/2008)   SGPT: 22 (12/14/2008)   HDL:47.40 (12/14/2008), 49.50 (09/21/2008)  LDL:129 (12/14/2008), 107 (03/02/2008)  Chol:187 (12/14/2008), 201 (09/21/2008)  Trig:53.0 (12/14/2008), 77.0 (09/21/2008)  Problem # 3:  ESSENTIAL HYPERTENSION, BENIGN (ICD-401.1) Assessment: Unchanged  very good control on arb- will continue that and diet and exercise  Her updated medication list for this problem includes:    Cozaar 50 Mg Tabs (Losartan potassium) .Marland Kitchen... 1 by mouth once daily  BP  today: 110/70 Prior BP: 108/70 (03/26/2009)  Labs Reviewed: K+: 3.9 (09/21/2008) Creat: : 0.6 (09/21/2008)   Chol: 187 (12/14/2008)   HDL: 47.40 (12/14/2008)   LDL: 129 (12/14/2008)   TG: 53.0 (12/14/2008)  Complete Medication List: 1)  Zegerid 40-1100 Mg Caps (Omeprazole-sodium bicarbonate) .... Take 1 capsule by mouth at bedtime 2)  Advair Diskus  100-50 Mcg/dose Misc (Fluticasone-salmeterol) .... Inhale 1- 2 puffs  once  a day 3)  Ortho Tri-cyclen Lo 0.025 Mg Tabs (Norgestimate-ethinyl estradiol) .... Take 1 tablet by mouth once a day 4)  Proventil Hfa 108 (90 Base) Mcg/act Aers (Albuterol sulfate) .... 2 puffs up to every 4 hours as needed for wheezing 5)  Cozaar 50 Mg Tabs (Losartan potassium) .Marland Kitchen.. 1 by mouth once daily 6)  Multivitamins Tabs (Multiple vitamin) .... One tablet by mouth once daily 7)  Iron 325 (65 Fe) Mg Tabs (Ferrous sulfate) .Marland Kitchen.. 1 by mouth two times a day 8)  Iodoral 12.5mg   .... Take one every day. (otc) 9)  Massage Therapy  .... For chronic back pain treatment 1-2 times per month  Patient Instructions: 1)  labs are looking better 2)  you can raise your HDL (good cholesterol) by increasing exercise and eating omega 3 fatty acid supplement like fish oil or flax seed oil over the counter 3)  you can lower LDL (bad cholesterol) by limiting saturated fats in diet like red meat, fried foods, egg yolks, fatty breakfast meats, high fat dairy products and shellfish  4)  no change in medicine  5)  schedule fasting labs and then follow up in 6 months  6)  lipid/ast/alt/AIC/renal 250.0, 272  Prescriptions: MASSAGE THERAPY for chronic back pain treatment 1-2 times per month  #1 year x 0   Entered and Authorized by:   Judith Part MD   Signed by:   Judith Part MD on 08/23/2009   Method used:   Print then Give to Patient   RxID:   [id number] COZAAR 50 MG TABS (LOSARTAN POTASSIUM) 1 by mouth once daily  #90 x 3   Entered and Authorized by:   Judith Part MD    Signed by:   Judith Part MD on 08/23/2009   Method used:   Print then Give to Patient   RxID:   [id number] ADVAIR DISKUS 100-50 MCG/DOSE MISC (FLUTICASONE-SALMETEROL) Inhale 1- 2 puffs  once  a day  #3 months x 3   Entered and Authorized by:   Judith Part MD   Signed by:   Judith Part MD on 08/23/2009   Method used:   Print then Give to Patient   RxID:   [id number] ORTHO TRI-CYCLEN LO 0.025 MG  TABS (NORGESTIMATE-ETHINYL ESTRADIOL) Take 1 tablet by mouth once a day  #3pack x 3   Entered and Authorized by:   Judith Part MD   Signed by:   Judith Part MD on 08/23/2009   Method used:   Print then Give to Patient   RxID:   [id number]   Current Allergies (reviewed today): No known allergies

## 2010-06-12 NOTE — Assessment & Plan Note (Signed)
Summary: 6 MONTH FOLLOW UP/[facility]   Vital Signs:  Patient profile:   50 year old female Height:      64.5 inches Weight:      216.25 pounds BMI:     36.68 Temp:     98.1 degrees F oral Pulse rate:   80 / minute Pulse rhythm:   regular BP sitting:   116 / 80  (left arm) Cuff size:   large  Vitals Entered By: Lewanda Rife LPN (February 28, 2010 9:39 AM) CC: six month f/u   History of Present Illness: here for 6 mo f/u of lipids and hyperglycemia and HTN   wt is up 1 lb  bp great at 116/80   lipids better with LDL 98 down from 129  AIc is up to 6.2 from 5.9 has not had time to eat proper lunch or plan her meals -- so eats too much sugar  workload will change and go down in january  lost 5 lb and gained it back   has been walking for exercise  still gets some cramping in feet  hurts all the way up to hips   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 she is really tired   not getting enough sleep  otherwise feeling ok   Allergies (verified): No Known Drug Allergies  Past History:  Past Medical History: Last updated: 08/23/2009 obesity stress reaction elevated bp without dx HTN endometriosis GERD (8/08) ADD (adult) asthma hyperlipidemia mild DM 2 obstructive sleep apnea (CPAP) plantar fasciitis with orthotics -- much difficulty adjusting these   GYN - [facility]  Donavan Burnet  Past Surgical History: Last updated: 11/08/2008 Hemorrhoidectomy uterine suspension R wrist fx laproscopy--endometriosis  Family History: Last updated: 11/09/2008 some family is in [place] father - prostate cancer  mother? 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 Eyes:  Denies blurring and eye irritation. CV:  Denies chest pain or discomfort, lightheadness, palpitations, and shortness of breath with exertion. Resp:  Denies cough, shortness of breath, and wheezing. GI:  Denies abdominal pain, change in bowel habits, indigestion, and nausea. GU:  Denies abnormal vaginal bleeding, discharge, and dysuria. MS:  Denies muscle aches and cramps. Derm:  Denies itching, lesion(s), poor wound healing, and rash. Neuro:  Denies numbness and tingling. Endo:  Denies cold intolerance, excessive thirst, excessive urination, and heat intolerance. Heme:  Denies abnormal bruising and bleeding.  Physical Exam  General:  overweight but generally well appearing  Head:  normocephalic, atraumatic, and no abnormalities observed.   Eyes:  vision grossly intact, pupils equal, and pupils round.   Ears:  R ear normal, L ear normal, no external deformities, and ear piercing(s) noted.   Mouth:  pharynx pink and moist.   Neck:  supple with full rom and no masses or thyromegally, no JVD or carotid bruit  Chest Wall:  No deformities, masses, or tenderness noted. Lungs:  CTA with good air exch no wheeze today no crackles  Heart:  Normal rate and regular rhythm. S1 and S2  normal without gallop, murmur, click, rub or other extra sounds. Abdomen:  Bowel sounds positive,abdomen soft and non-tender without masses, organomegaly or hernias noted. no renal bruits  Msk:  No deformity or scoliosis noted of thoracic or lumbar spine.   Pulses:  R and L carotid,radial,femoral,dorsalis pedis and posterior tibial pulses are full and equal bilaterally Extremities:  bottoms of feet are hypersensitive  Neurologic:  strength normal in all extremities, sensation intact to light touch, and gait normal.   Skin:  Intact without suspicious  lesions or rashes Cervical Nodes:  No lymphadenopathy noted Psych:  normal affect, talkative and pleasant does seem a bit tired    Impression & Recommendations:  Problem # 1:  FOOT PAIN (ICD-729.5) Assessment Unchanged with problems- mult orthotics and hx of plantar fasciitis - affecting gait  Orders: Sports Medicine (Sports Med)  Problem # 2:  DIABETES-TYPE 2 (ICD-250.00) Assessment: Unchanged hyperglycemia withAIc of 6.2 will get back to low glycemic diet  Her updated medication list for this problem includes:    Cozaar 50 Mg Tabs (Losartan potassium) .Marland Kitchen... 1 by mouth once daily  Problem # 3:  ESSENTIAL HYPERTENSION, BENIGN (ICD-401.1) Assessment: Unchanged  good control with cozaar and no changes labs rev Her updated medication list for this problem includes:    Cozaar 50 Mg Tabs (Losartan potassium) .Marland Kitchen... 1 by mouth once daily  BP today: 116/80 Prior BP: 110/70 (08/23/2009)  Labs Reviewed: K+: 4.0 (02/21/2010) Creat: : 0.7 (02/21/2010)   Chol: 172 (02/21/2010)   HDL: 54.10 (02/21/2010)   LDL: 98 (02/21/2010)   TG: 100.0 (02/21/2010)  Problem # 4:  HYPERLIPIDEMIA (ICD-272.4) Assessment: Unchanged rev ;abs and overall improvement with better diet  rev low sat fat diet  Labs Reviewed: SGOT: 17 (02/21/2010)   SGPT: 16 (02/21/2010)   HDL:54.10 (02/21/2010), 47.40 (12/14/2008)  LDL:98 (02/21/2010), 129 (84/69/6295)  Chol:172 (02/21/2010), 187 (12/14/2008)  Trig:100.0 (02/21/2010), 53.0 (12/14/2008)  Complete Medication List: 1)  Zegerid 40-1100 Mg Caps (Omeprazole-sodium bicarbonate) .... Take 1 capsule by mouth at bedtime 2)  Advair Diskus 100-50 Mcg/dose Misc (Fluticasone-salmeterol) .Marland Kitchen.. 1 inhalation two times a day 3)  Ortho Tri-cyclen Lo 0.025 Mg Tabs (Norgestimate-ethinyl estradiol) .... Take 1 tablet by mouth once a day 4)  Proventil Hfa 108 (90 Base) Mcg/act Aers (Albuterol sulfate) .... 2 puffs up to every 4 hours as needed for wheezing 5)  Cozaar 50 Mg Tabs  (Losartan potassium) .Marland Kitchen.. 1 by mouth once daily 6)  Multivitamins Tabs (Multiple vitamin) .... One tablet by mouth once daily 7)  Iron 325 (65 Fe) Mg Tabs (Ferrous sulfate) .Marland Kitchen.. 1 by mouth daily 8)  Iodoral 12.5mg   .... Take one every day. (otc) 9)  Massage Therapy  .... For chronic back pain treatment 1-2 times per month  Patient Instructions: 1)  get back to lower sugar diet 2)  exercise as tolerated  3)  we will do ref for sports med Dr Darrick Penna at check out  4)  schedule fasting lab and follow up in 6 months  5)  lipid/ast/alt/cbc / tsh/ renal / AIC  for 272 , 401.1, hyperglycemia  6)  no changes in medicine   Orders Added: 1)  Sports Medicine [Sports Med] 2)  Est. Patient Level IV [28413]   Immunization History:  Influenza Immunization History:    Influenza:  historical (02/21/2010)   Immunization History:  Influenza Immunization History:    Influenza:  Historical (02/21/2010)  Current Allergies (reviewed today): No known allergies

## 2010-06-12 NOTE — Letter (Signed)
Summary: *Consult Note  [facility]  [address]   [place], [place] [address]   Phone: [phone]  Fax: [phone]    Re:    Patty Williams DOB:    [birth year]   Dear: Dr. Milinda Antis   Thank you for requesting that we see the above patient for consultation.  A copy of the detailed office note will be sent under separate cover, for your review.  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   Our recommendation is for: Cusionhed insoles with metatarsal pads.  More specific plantar fascia stretches   After today's visit, the patients current medications include: 1)  ZEGERID 40-1100 MG  CAPS (OMEPRAZOLE-SODIUM BICARBONATE) Take 1 capsule by mouth at bedtime 2)  ADVAIR DISKUS 100-50 MCG/DOSE MISC (FLUTICASONE-SALMETEROL) 1 inhalation two times a day 3)  ORTHO TRI-CYCLEN LO 0.025 MG  TABS (NORGESTIMATE-ETHINYL ESTRADIOL) Take 1 tablet by mouth once a day 4)  PROVENTIL HFA 108 (90 BASE) MCG/ACT AERS (ALBUTEROL SULFATE) 2 puffs up to every 4 hours as needed for wheezing 5)  COZAAR 50 MG TABS (LOSARTAN POTASSIUM) 1 by mouth once daily 6)  MULTIVITAMINS   TABS (MULTIPLE VITAMIN) one tablet by mouth once daily 7)  IRON 325 (65 FE) MG TABS (FERROUS SULFATE) 1 by mouth daily 8)  * IODORAL 12.5MG  Take one every day. (OTC) 9)  * MASSAGE THERAPY for chronic back pain treatment 1-2 times per month   Thank you for this consultation.  If you have any further questions regarding the care of this patient, please do not hesitate to contact me @ [phone]  Thank you for this opportunity to look after your patient.  Sincerely,   Corbin Ade MD Sports Medicine Fellow  Juluis Rainier, MD

## 2010-06-12 NOTE — Letter (Signed)
Summary: Results Follow up Letter  Patty Williams at [facility]  [address] [place], [place] [address]   Phone: [phone]  Fax: [phone]    03/07/2010 MRN: [id number]    [facility] [place] [address] [place], [place]  [address]    Dear Patty Williams,  The following are the results of your recent test(s):  Test         Result    Pap Smear:        Normal _____  Not Normal _____ Comments: ______________________________________________________ Cholesterol: LDL(Bad cholesterol):         Your goal is less than:         HDL (Good cholesterol):       Your goal is more than: Comments:  ______________________________________________________ Mammogram:        Normal __X___  Not Normal _____ Comments:Repeat in one year.   ___________________________________________________________________ Hemoccult:        Normal _____  Not normal _______ Comments:    _____________________________________________________________________ Other Tests:    We routinely do not discuss normal results over the telephone.  If you desire a copy of the results, or you have any questions about this information we can discuss them at your next office visit.   Sincerely,    Idamae Schuller Tower,MD  MT/ri

## 2010-06-12 NOTE — Letter (Signed)
Summary: [facility]  [facility]   Imported By: Lanelle Bal 09/05/2009 13:28:12  _____________________________________________________________________  External Attachment:    Type:   Image     Comment:   External Document

## 2010-07-15 ENCOUNTER — Encounter: Payer: Self-pay | Admitting: Family Medicine

## 2010-07-15 DIAGNOSIS — E119 Type 2 diabetes mellitus without complications: Secondary | ICD-10-CM

## 2010-07-15 DIAGNOSIS — Z9989 Dependence on other enabling machines and devices: Secondary | ICD-10-CM | POA: Insufficient documentation

## 2010-07-15 DIAGNOSIS — F988 Other specified behavioral and emotional disorders with onset usually occurring in childhood and adolescence: Secondary | ICD-10-CM

## 2010-07-15 DIAGNOSIS — F430 Acute stress reaction: Secondary | ICD-10-CM

## 2010-07-15 DIAGNOSIS — E669 Obesity, unspecified: Secondary | ICD-10-CM

## 2010-07-15 DIAGNOSIS — R7303 Prediabetes: Secondary | ICD-10-CM | POA: Insufficient documentation

## 2010-07-15 DIAGNOSIS — G4733 Obstructive sleep apnea (adult) (pediatric): Secondary | ICD-10-CM

## 2010-07-15 DIAGNOSIS — E785 Hyperlipidemia, unspecified: Secondary | ICD-10-CM

## 2010-07-15 DIAGNOSIS — M722 Plantar fascial fibromatosis: Secondary | ICD-10-CM

## 2010-07-15 DIAGNOSIS — K219 Gastro-esophageal reflux disease without esophagitis: Secondary | ICD-10-CM

## 2010-07-15 DIAGNOSIS — R030 Elevated blood-pressure reading, without diagnosis of hypertension: Secondary | ICD-10-CM

## 2010-07-21 ENCOUNTER — Telehealth: Payer: Self-pay | Admitting: Family Medicine

## 2010-07-29 NOTE — Progress Notes (Signed)
  Phone Note Call from Patient Call back at [phone]   Caller: Patient Call For: Dr.Cobin Cadavid Summary of Call: Pt. asked if you'd call her.  She has a quick question.  She tried to schedule an appt.,but you don't have any openings.  She gave you the private phone number to her office and she asked if you would tell whoever answers to pull her out of a room. Initial call taken by: Beau Fanny,  July 21, 2010 11:43 AM  Follow-up for Phone Call        she is having some R breast tenderness after heavy lifting and trauma moving into her new office no redness/ heat/ nipple d/c/ or mass  is seeing her chiropractor finishes new pack of pills this week  has appt with me in april  Follow-up by: Judith Part MD,  July 21, 2010 2:25 PM  Additional Follow-up for Phone Call Additional follow up Details #1::        I adv warm compresses and analgesics as needed and update if worse or any signs of infection  continue self exams  f/u april - will check it (but earlier if needed)  Additional Follow-up by: Judith Part MD,  July 21, 2010 2:26 PM

## 2010-08-29 ENCOUNTER — Encounter: Payer: Self-pay | Admitting: Family Medicine

## 2010-08-29 ENCOUNTER — Ambulatory Visit (INDEPENDENT_AMBULATORY_CARE_PROVIDER_SITE_OTHER): Payer: 59 | Admitting: Family Medicine

## 2010-08-29 ENCOUNTER — Other Ambulatory Visit: Payer: Self-pay

## 2010-08-29 VITALS — BP 130/80 | HR 84 | Temp 98.4°F | Ht 65.0 in | Wt 220.0 lb

## 2010-08-29 DIAGNOSIS — I10 Essential (primary) hypertension: Secondary | ICD-10-CM

## 2010-08-29 DIAGNOSIS — I629 Nontraumatic intracranial hemorrhage, unspecified: Secondary | ICD-10-CM

## 2010-08-29 NOTE — Patient Instructions (Signed)
We will keep eye on blood pressure for now. For headaches - slowly increase demand at work.  I'm glad weekend is here - rest and relax.  No exherting the brain.  Monday light schedule to see how you do.  If fine without headaches and fogginess improving, may slowly add on load. If BP staying up or HA or fogginess continuing or worsening, return to see [place].
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
## 2010-08-30 DIAGNOSIS — I629 Nontraumatic intracranial hemorrhage, unspecified: Secondary | ICD-10-CM | POA: Insufficient documentation

## 2010-08-30 NOTE — Assessment & Plan Note (Signed)
Unsure if truly had concussion, however fact that she does have some difficulty multitasking at work concerning for this (vs sequelae of small intracranial bleed). Told by NSG that would have headaches for next several weeks, should slowly resolve. Discussed concussion and management, recommend no brain stimulation over weekend, may go into work Monday with lightened load and slowly pick up each day. If worsening HA or fogginess not resolving, to back down on activity.  If not improving as expected, to return for f/u and reassessment. Pt agrees.  We should be getting records from [facility].

## 2010-08-30 NOTE — Assessment & Plan Note (Signed)
Actually BP slowly coming down on it's own as evidenced by better control today. Do not want to acutely lower bp so nothing added today. If cozaar not controlling bp, to return.

## 2010-09-05 ENCOUNTER — Ambulatory Visit: Payer: Self-pay | Admitting: Family Medicine

## 2010-09-16 ENCOUNTER — Other Ambulatory Visit: Payer: Self-pay | Admitting: Family Medicine

## 2010-09-16 DIAGNOSIS — D649 Anemia, unspecified: Secondary | ICD-10-CM

## 2010-09-16 DIAGNOSIS — I10 Essential (primary) hypertension: Secondary | ICD-10-CM

## 2010-09-16 DIAGNOSIS — E669 Obesity, unspecified: Secondary | ICD-10-CM

## 2010-09-16 DIAGNOSIS — E7800 Pure hypercholesterolemia, unspecified: Secondary | ICD-10-CM

## 2010-09-18 ENCOUNTER — Other Ambulatory Visit: Payer: Self-pay | Admitting: Family Medicine

## 2010-09-18 NOTE — Telephone Encounter (Signed)
Pt needs to call for appt. 

## 2010-09-19 ENCOUNTER — Other Ambulatory Visit (INDEPENDENT_AMBULATORY_CARE_PROVIDER_SITE_OTHER): Payer: 59 | Admitting: Family Medicine

## 2010-09-19 ENCOUNTER — Other Ambulatory Visit: Payer: Self-pay

## 2010-09-19 DIAGNOSIS — E119 Type 2 diabetes mellitus without complications: Secondary | ICD-10-CM

## 2010-09-19 DIAGNOSIS — E669 Obesity, unspecified: Secondary | ICD-10-CM

## 2010-09-19 DIAGNOSIS — D649 Anemia, unspecified: Secondary | ICD-10-CM

## 2010-09-19 DIAGNOSIS — I10 Essential (primary) hypertension: Secondary | ICD-10-CM

## 2010-09-19 DIAGNOSIS — E7800 Pure hypercholesterolemia, unspecified: Secondary | ICD-10-CM

## 2010-09-19 LAB — RENAL FUNCTION PANEL
CO2: 28 mEq/L (ref 19–32)
Creatinine, Ser: 0.6 mg/dL (ref 0.4–1.2)
GFR: 116.72 mL/min (ref >60.00)
Glucose, Bld: 101 mg/dL — ABNORMAL HIGH (ref 70–99)
Potassium: 4.5 mEq/L (ref 3.5–5.1)
Sodium: 144 mEq/L (ref 135–145)

## 2010-09-19 LAB — LIPID PANEL
Cholesterol: 200 mg/dL (ref 0–200)
Triglycerides: 72 mg/dL (ref 0.0–149.0)

## 2010-09-19 LAB — CBC WITH DIFFERENTIAL/PLATELET
Eosinophils Relative: 2.5 % (ref 0.0–5.0)
Lymphocytes Relative: 35.7 % (ref 12.0–46.0)
MCV: 90.2 fl (ref 78.0–100.0)
Monocytes Absolute: 0.5 10*3/uL (ref 0.1–1.0)
Neutrophils Relative %: 53.9 % (ref 43.0–77.0)
Platelets: 302 10*3/uL (ref 150.0–400.0)
RBC: 4.02 Mil/uL (ref 3.87–5.11)
WBC: 6.1 10*3/uL (ref 4.5–10.5)

## 2010-09-19 LAB — TSH: TSH: 1.29 u[IU]/mL (ref 0.35–5.50)

## 2010-09-19 LAB — ALT: ALT: 24 U/L (ref 0–35)

## 2010-09-19 LAB — HEMOGLOBIN A1C: Hgb A1c MFr Bld: 6.2 % (ref 4.6–6.5)

## 2010-09-23 NOTE — Procedures (Signed)
Patty Williams, HERRINGSHAW NO.:  [id number]   MEDICAL RECORD NO.:  [id number]          PATIENT TYPE:  OUT   LOCATION:  SLEEP CENTER                 FACILITY:  [facility]   PHYSICIAN:  Barbaraann Share, MD,FCCPDATE OF BIRTH:  04/20/1960   DATE OF STUDY:  09/23/2006                            NOCTURNAL POLYSOMNOGRAM   REFERRING PHYSICIAN:  Barbaraann Share, MD,FCCP   INDICATION FOR STUDY:  Hypersomnia with sleep apnea.   [name] SCORE:  14   MEDICATIONS:   SLEEP ARCHITECTURE:  The patient had a total sleep time of 319 minutes  with very little REM or slow wave sleep.  Sleep onset latency was  prolonged at 38 minutes and REM onset was very prolonged at 269 minutes.  Sleep efficiency was poor at 72%.   RESPIRATORY DATA:  The patient was found to have 92 hypopneas and 32  apneas for an apnea/hypopnea index of 23 events per hour.  The events  occurred in all body positions, and there was moderate to loud snoring  noted throughout.   OXYGEN DATA:  There was O2 desaturation as low as 81% with the patient's  defective events.   CARDIAC DATA:  The patient had no clinically significant cardiac  arrhythmias noted.   MOVEMENT-PARASOMNIA:  The patient was found to have 5 leg jerks with  less than 1 per hour resulting in arousal or awakening.   IMPRESSIONS-RECOMMENDATIONS:  Moderate obstructive sleep apnea/hypopnea  syndrome with an apnea/hypopnea index of 23 events per hour and O2  desaturation as low as 81%.  The patient achieved very little slow wave  sleep and REM, and therefore her degree of sleep apnea may be  underestimated.  Treatment for this  degree of sleep apnea can weight loss alone if applicable, upper airwave  surgery, or appliance and also CPAP.  Clinical correlation is suggested.      Barbaraann Share, MD,FCCP  Diplomate, American Board of Sleep  Medicine  Electronically Signed     [facility]  D:  10/01/2006 09:06:36  T:  10/01/2006 09:52:13   Job:  [id number]

## 2010-09-26 ENCOUNTER — Encounter: Payer: Self-pay | Admitting: Family Medicine

## 2010-09-26 ENCOUNTER — Ambulatory Visit (INDEPENDENT_AMBULATORY_CARE_PROVIDER_SITE_OTHER): Payer: 59 | Admitting: Family Medicine

## 2010-09-26 DIAGNOSIS — I10 Essential (primary) hypertension: Secondary | ICD-10-CM

## 2010-09-26 DIAGNOSIS — E119 Type 2 diabetes mellitus without complications: Secondary | ICD-10-CM

## 2010-09-26 DIAGNOSIS — E785 Hyperlipidemia, unspecified: Secondary | ICD-10-CM

## 2010-09-26 NOTE — Patient Instructions (Signed)
Watch sat fats in diet  Avoid red meat/ fried foods/ egg yolks/ fatty breakfast meats/ butter, cheese and high fat dairy/ and shellfish  Work on exercise as you tolerate it  Update me if headache worsens  Schedule PE in 6 months with labs prior Keep striving toward balance in your life

## 2010-09-26 NOTE — Assessment & Plan Note (Signed)
Worse with poor diet Rev low sat fat diet  F/u 6 mo after lab
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
## 2010-09-26 NOTE — Assessment & Plan Note (Signed)
Hyperglycemia is stable Rev low glycemic diet Lab and PE in 6 mo

## 2010-09-26 NOTE — Assessment & Plan Note (Signed)
Good control  No change Exercise - ramping up

## 2010-09-26 NOTE — Assessment & Plan Note (Signed)
[facility]                             PULMONARY OFFICE NOTE   Patty Williams, Patty Williams                      MRN:          [id number]  DATE:08/27/2006                            DOB:          Sep 04, 1960    HISTORY OF PRESENT ILLNESS:  The patient is a 50 year old female who I  have been asked to see for obstructive sleep apnea.  The patient has  been complaining of poor sleep efficiency and inappropriate daytime  sleepiness with periods of inactivity.  She typically gets to bed  between 10 and 12, and gets up at 6:30 a.m. to start her day.  She is  not rested upon arising.  She has been told by friends that she has loud  snoring and pauses in her breathing during sleep.  The patient has  difficulties reading without becoming sleepy and has even noticed  sleepiness with driving.  Of note her weight is up about 10-12 pounds  over the last 2 years.   PAST MEDICAL HISTORY:  1. Asthma.  2. History of endometriosis.  3. History of GERD.   CURRENT MEDICATIONS:  1. Zoloft 50 mg daily.  2. Advair 100/50 one daily.  3. BCPs daily.  4. Protonix 40 mg daily.  5. Zantac 75 mg nightly p.r.n.   THE PATIENT HAS NO KNOWN DRUG ALLERGIES.   SOCIAL HISTORY:  She is single, she has never smoked.   FAMILY HISTORY:  Remarkable for mother having heart disease and father  having prostate cancer.   REVIEW OF SYSTEMS:  As per history of present illness, also see patient  intake form documented in the chart.   PHYSICAL EXAMINATION:  GENERAL:  She is an overweight female in no acute  distress.  Blood pressure is 132/88, pulse 79, temperature 98.4, weight  is 20 pounds, O2 saturation is 96% on room air.  HEENT:  Pupils equal, round, reactive to light and accommodation;  extraocular muscles are intact.  Nares show septal deviation to the left  with obstruction of that nare.  Oropharynx does show mild elongation of  the soft palate and uvula.  NECK:  Supple without JVD  or lymphadenopathy, there is no palpable  thyromegaly.  CHEST:  Totally clear.  CARDIAC:  Exam reveals regular rate and rhythm; no murmurs, rubs, or  gallops.  ABDOMEN:  Soft, nontender, with good bowel sounds.  GENITAL, RECTAL, BREAST:  Exam not done and not indicated.  LOWER EXTREMITIES:  Without edema, pulses are intact distally.  NEUROLOGICALLY:  Alert and oriented with no obvious motor deficits.   IMPRESSION:  Probable obstructive sleep apnea.  The patient gives a very  good history for this.  Is overweight, and has abnormal upper airway  anatomy.  Have had a long discussion with her about sleep apnea and both  its quality of life side effects as well as cardiovascular effects.  She  was agreeable with proceeding with a sleep study.   PLAN:  1. Work on weight loss.  2. Schedule for nocturnal polysomnography.  3. The patient will follow up after the above.  Barbaraann Share, MD,FCCP  Electronically Signed    [facility]  DD: 09/07/2006  DT: 09/07/2006  Job #: [id number]   cc:   Marne A. Milinda Antis, MD
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
## 2010-10-17 ENCOUNTER — Other Ambulatory Visit: Payer: Self-pay | Admitting: Family Medicine

## 2010-10-17 MED ORDER — LOSARTAN POTASSIUM 50 MG PO TABS: 50.0000 mg | ORAL_TABLET | Freq: Every day | ORAL | Status: DC

## 2011-02-16 ENCOUNTER — Other Ambulatory Visit: Payer: Self-pay | Admitting: Family Medicine

## 2011-03-05 ENCOUNTER — Other Ambulatory Visit: Payer: Self-pay | Admitting: *Deleted

## 2011-03-05 MED ORDER — ALBUTEROL SULFATE HFA 108 (90 BASE) MCG/ACT IN AERS: 2.0000 | INHALATION_SPRAY | RESPIRATORY_TRACT | Status: DC | PRN

## 2011-03-06 ENCOUNTER — Encounter: Payer: Self-pay | Admitting: Family Medicine

## 2011-03-09 ENCOUNTER — Encounter: Payer: Self-pay | Admitting: *Deleted

## 2011-03-20 ENCOUNTER — Other Ambulatory Visit: Payer: 59

## 2011-03-27 ENCOUNTER — Encounter: Payer: 59 | Admitting: Family Medicine

## 2011-04-17 ENCOUNTER — Other Ambulatory Visit: Payer: 59

## 2011-04-24 ENCOUNTER — Encounter: Payer: 59 | Admitting: Family Medicine

## 2011-04-30 ENCOUNTER — Telehealth: Payer: Self-pay | Admitting: Family Medicine

## 2011-04-30 ENCOUNTER — Other Ambulatory Visit (INDEPENDENT_AMBULATORY_CARE_PROVIDER_SITE_OTHER): Payer: 59

## 2011-04-30 DIAGNOSIS — E785 Hyperlipidemia, unspecified: Secondary | ICD-10-CM

## 2011-04-30 DIAGNOSIS — E119 Type 2 diabetes mellitus without complications: Secondary | ICD-10-CM

## 2011-04-30 DIAGNOSIS — Z0000 Encounter for general adult medical examination without abnormal findings: Secondary | ICD-10-CM

## 2011-04-30 LAB — LIPID PANEL
HDL: 45.2 mg/dL (ref >39.00)
LDL Cholesterol: 126 mg/dL — ABNORMAL HIGH (ref 0–99)
Total CHOL/HDL Ratio: 4
VLDL: 17 mg/dL (ref 0.0–40.0)

## 2011-04-30 LAB — COMPREHENSIVE METABOLIC PANEL
ALT: 31 U/L (ref 0–35)
AST: 22 U/L (ref 0–37)
Albumin: 3.8 g/dL (ref 3.5–5.2)
Alkaline Phosphatase: 72 U/L (ref 39–117)
Glucose, Bld: 109 mg/dL — ABNORMAL HIGH (ref 70–99)
Potassium: 4.4 mEq/L (ref 3.5–5.1)
Sodium: 143 mEq/L (ref 135–145)
Total Bilirubin: 0.3 mg/dL (ref 0.3–1.2)
Total Protein: 6.9 g/dL (ref 6.0–8.3)

## 2011-04-30 LAB — TSH: TSH: 2.05 u[IU]/mL (ref 0.35–5.50)

## 2011-04-30 LAB — CBC WITH DIFFERENTIAL/PLATELET
Basophils Absolute: 0 10*3/uL (ref 0.0–0.1)
Eosinophils Relative: 1.9 % (ref 0.0–5.0)
HCT: 38.4 % (ref 36.0–46.0)
Lymphs Abs: 2.3 10*3/uL (ref 0.7–4.0)
MCV: 90 fl (ref 78.0–100.0)
Monocytes Absolute: 0.6 10*3/uL (ref 0.1–1.0)
Neutrophils Relative %: 61 % (ref 43.0–77.0)
Platelets: 295 10*3/uL (ref 150.0–400.0)
RDW: 14.5 % (ref 11.5–14.6)
WBC: 8 10*3/uL (ref 4.5–10.5)

## 2011-04-30 LAB — HEMOGLOBIN A1C: Hgb A1c MFr Bld: 6.1 % (ref 4.6–6.5)

## 2011-04-30 NOTE — Telephone Encounter (Signed)
Message copied by Judy Pimple on Thu Apr 30, 2011  9:14 AM ------      Message from: Baldomero Lamy      Created: Tue Apr 28, 2011  7:41 AM      Regarding: Cpx labs Thurs 12/20       Please order  future cpx labs for pt's upcomming lab appt.      Thanks      Rodney Booze

## 2011-05-11 ENCOUNTER — Ambulatory Visit (INDEPENDENT_AMBULATORY_CARE_PROVIDER_SITE_OTHER): Payer: 59 | Admitting: Family Medicine

## 2011-05-11 ENCOUNTER — Encounter: Payer: Self-pay | Admitting: Family Medicine

## 2011-05-11 VITALS — BP 122/84 | HR 64 | Temp 98.2°F | Ht 65.25 in | Wt 215.2 lb

## 2011-05-11 DIAGNOSIS — E785 Hyperlipidemia, unspecified: Secondary | ICD-10-CM

## 2011-05-11 DIAGNOSIS — I10 Essential (primary) hypertension: Secondary | ICD-10-CM

## 2011-05-11 DIAGNOSIS — E119 Type 2 diabetes mellitus without complications: Secondary | ICD-10-CM

## 2011-05-11 DIAGNOSIS — Z0000 Encounter for general adult medical examination without abnormal findings: Secondary | ICD-10-CM

## 2011-05-11 DIAGNOSIS — Z23 Encounter for immunization: Secondary | ICD-10-CM

## 2011-05-11 NOTE — Patient Instructions (Signed)
Tdap and pneumovax today  Keep up the good work with diet and exercise  Schedule fasting labs and follow up in 6 months

## 2011-05-11 NOTE — Assessment & Plan Note (Signed)
Reviewed health habits including diet and exercise and skin cancer prevention Also reviewed health mt list, fam hx and immunizations  Rev wellness labs utd gyn  Pneumovax and Tdap today
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
## 2011-05-11 NOTE — Assessment & Plan Note (Signed)
Improved a1c of 6.1 Disc low glycemic diet and need for wt loss and exercise  Overall improving slolwy

## 2011-05-11 NOTE — Assessment & Plan Note (Signed)
LDL calc is improved - under 130 Disc goals for lipids and reasons to control them Rev labs with pt Rev low sat fat diet in detail  F/u 6 mo

## 2011-05-12 NOTE — Assessment & Plan Note (Signed)
bp in fair control at this time  No changes needed  Disc lifstyle change with low sodium diet and exercise   

## 2011-05-29 ENCOUNTER — Telehealth: Payer: Self-pay | Admitting: Family Medicine

## 2011-05-29 NOTE — Telephone Encounter (Signed)
Patient is requesting a CPAP order for [facility].  Patient stated she has sleep apnea and that she will need an order for the supplies for home use.  She said that she needs a full face med quatro mask but said that [facility] will only need an order that states CPAP supplies.  The fax number for [facility] is [phone] and patient would like return call to confirm info and that its been sent at her cell [phone].  Thanks

## 2011-06-01 NOTE — Telephone Encounter (Signed)
Order done on px for cpap supplies- in the IN box

## 2011-06-02 NOTE — Telephone Encounter (Signed)
Order faxed to [phone] and Patient notified as instructed by telephone.

## 2011-06-26 ENCOUNTER — Telehealth: Payer: Self-pay | Admitting: Family Medicine

## 2011-06-26 NOTE — Telephone Encounter (Signed)
Order was faxed on 06/02/11 but I refaxed that order again to [phone] as instructed. Patient notified  by telephone.

## 2011-07-03 ENCOUNTER — Ambulatory Visit (INDEPENDENT_AMBULATORY_CARE_PROVIDER_SITE_OTHER): Payer: 59 | Admitting: Family Medicine

## 2011-07-03 ENCOUNTER — Encounter: Payer: Self-pay | Admitting: Family Medicine

## 2011-07-03 VITALS — BP 128/82 | HR 76 | Temp 98.6°F | Ht 65.25 in | Wt 216.0 lb

## 2011-07-03 DIAGNOSIS — R300 Dysuria: Secondary | ICD-10-CM

## 2011-07-03 DIAGNOSIS — N390 Urinary tract infection, site not specified: Secondary | ICD-10-CM | POA: Insufficient documentation

## 2011-07-03 DIAGNOSIS — J069 Acute upper respiratory infection, unspecified: Secondary | ICD-10-CM

## 2011-07-03 LAB — POCT URINALYSIS DIPSTICK
Bilirubin, UA: NEGATIVE
Glucose, UA: NEGATIVE
Nitrite, UA: NEGATIVE

## 2011-07-03 LAB — POCT UA - MICROSCOPIC ONLY

## 2011-07-03 MED ORDER — BENZONATATE 100 MG PO CAPS: 100.0000 mg | ORAL_CAPSULE | Freq: Three times a day (TID) | ORAL | Status: AC | PRN

## 2011-07-03 MED ORDER — GUAIFENESIN-CODEINE 100-10 MG/5ML PO SYRP: 5.0000 mL | ORAL_SOLUTION | Freq: Three times a day (TID) | ORAL | Status: AC | PRN

## 2011-07-03 MED ORDER — CIPROFLOXACIN HCL 250 MG PO TABS: 250.0000 mg | ORAL_TABLET | Freq: Two times a day (BID) | ORAL | Status: AC

## 2011-07-03 NOTE — Assessment & Plan Note (Signed)
With pos dip and micro  Will send for culture  tx with 5 d of cipro  If neg/ symptoms persist will urge to f/u with gyn

## 2011-07-03 NOTE — Assessment & Plan Note (Signed)
Minimal reactive airways/ reassuring exam Urged to continue bid advair until better Refilled rob/cod cough med for night and tessalon during the day Fluids/ rest/ sympt control Update if not starting to improve in a week or if worsening

## 2011-07-03 NOTE — Patient Instructions (Signed)
Continue fluids and rest  Try the tessalon for cough during the day and codeine cough med at night  Take the cipro for uti We will call you with a culture result  Update if not starting to improve in a week or if worsening
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
## 2011-07-06 LAB — URINE CULTURE: Colony Count: >=100000

## 2011-07-10 ENCOUNTER — Other Ambulatory Visit: Payer: 59

## 2011-07-17 ENCOUNTER — Encounter: Payer: 59 | Admitting: Family Medicine

## 2011-07-22 ENCOUNTER — Telehealth: Payer: Self-pay | Admitting: Family Medicine

## 2011-07-22 MED ORDER — CIPROFLOXACIN HCL 250 MG PO TABS: 250.0000 mg | ORAL_TABLET | Freq: Two times a day (BID) | ORAL | Status: DC

## 2011-07-22 NOTE — Telephone Encounter (Signed)
Spoke with pt and she will be out of town at a conference part of this week and pt wondered if could refill Cipro and then when finishes antibiotic if pt still has symptoms will make time to come in a see Dr Milinda Antis. Pt said she is having burning when urinates and voiding smaller amts more often and still the spotting. Difficult sometimes to speak with Dr Stormy Fabian so I asked Dr Milinda Antis and she said she is concerned about resistance but if pt is in out of town conference she will refill Cipro 250 mg  Taking one tablet by mouth twice a day # 10 x 0. If pt condition worsens should be see at [facility] where her conference is and if pt still has symptoms when finishes antibiotic to call for f/u appt. Med sent electronically to [facility] pharmacy as instructed.

## 2011-07-22 NOTE — Telephone Encounter (Signed)
Please ask her to come in to leave ua when she can - thanks

## 2011-07-22 NOTE — Telephone Encounter (Signed)
Left message for pt to call back  °

## 2011-07-22 NOTE — Telephone Encounter (Signed)
To: [facility] (Daytime Triage) Fax: [phone] From: [facility] Date/ Time: 07/21/2011 2:38 PM Taken By: Freddie Breech, RN Caller: Kajal Facility: not collected Patient: Patty Williams, Patty Williams DOB: Jul 15, 1960 Phone: [phone] Reason for Call: Pt is returning a call from the ofc nurse. She is having mild UTI sx after completing Cipro for a UTI. She ? If she needs another round of abx. Pls call. Pharm [facility] Ditto. Regarding Appointment: Appt Date: Appt Time: Unknown Provider: Reason: Details: Outcome:

## 2011-07-31 ENCOUNTER — Ambulatory Visit (INDEPENDENT_AMBULATORY_CARE_PROVIDER_SITE_OTHER): Payer: 59 | Admitting: Family Medicine

## 2011-07-31 ENCOUNTER — Encounter: Payer: Self-pay | Admitting: Family Medicine

## 2011-07-31 VITALS — BP 112/80 | HR 100 | Temp 98.1°F | Ht 65.0 in | Wt 216.8 lb

## 2011-07-31 DIAGNOSIS — N390 Urinary tract infection, site not specified: Secondary | ICD-10-CM

## 2011-07-31 LAB — POCT URINALYSIS DIPSTICK
Nitrite, UA: NEGATIVE
Protein, UA: NEGATIVE
Urobilinogen, UA: NEGATIVE
pH, UA: 6
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
## 2011-08-12 ENCOUNTER — Telehealth: Payer: Self-pay | Admitting: Family Medicine

## 2011-08-12 DIAGNOSIS — N390 Urinary tract infection, site not specified: Secondary | ICD-10-CM | POA: Insufficient documentation

## 2011-08-12 NOTE — Telephone Encounter (Signed)
I will go ahead and do referral - would like Dr Achilles Dunk in [place] if he is covered Let me know if not  Tell her Shirlee Limerick will call her  If fever or severe symtpoms- alert me

## 2011-08-12 NOTE — Telephone Encounter (Signed)
Patient was told the last time she had a UTI,if she has another UTI, not to schedule an appointment,but she'll be referred to a Urologist.  Patient has had 3 UTI's in a short amount of time. Patient would like to be referred to the urologist that you think is best.  She trusts Dr.Tower's judgment for the best Urologist.

## 2011-08-12 NOTE — Telephone Encounter (Signed)
Called patient x2 at number listed, phone never rung but I could hear someone on the other end talking, I kept saying hello but got no response.  Will call back again in the morning.

## 2011-08-13 NOTE — Telephone Encounter (Signed)
Advised by Shirlee Limerick that she has already spoken with patient regarding the referral and is working on it now.

## 2011-08-13 NOTE — Telephone Encounter (Signed)
Left message with staff at her office for her to return call.

## 2011-10-06 ENCOUNTER — Other Ambulatory Visit: Payer: Self-pay

## 2011-10-06 MED ORDER — FLUTICASONE-SALMETEROL 100-50 MCG/DOSE IN AEPB: 1.0000 | INHALATION_SPRAY | Freq: Two times a day (BID) | RESPIRATORY_TRACT | Status: DC

## 2011-10-06 NOTE — Telephone Encounter (Signed)
Pt request refill Advair to [facility]. Pt notified while on phone.

## 2011-10-09 ENCOUNTER — Ambulatory Visit (INDEPENDENT_AMBULATORY_CARE_PROVIDER_SITE_OTHER): Payer: 59 | Admitting: Family Medicine

## 2011-10-09 ENCOUNTER — Encounter: Payer: Self-pay | Admitting: Family Medicine

## 2011-10-09 VITALS — BP 104/70 | HR 68 | Temp 98.3°F | Wt 218.2 lb

## 2011-10-09 DIAGNOSIS — W57XXXA Bitten or stung by nonvenomous insect and other nonvenomous arthropods, initial encounter: Secondary | ICD-10-CM | POA: Insufficient documentation

## 2011-10-09 DIAGNOSIS — T148XXA Other injury of unspecified body region, initial encounter: Secondary | ICD-10-CM

## 2011-10-09 DIAGNOSIS — K219 Gastro-esophageal reflux disease without esophagitis: Secondary | ICD-10-CM

## 2011-10-09 DIAGNOSIS — T148 Other injury of unspecified body region: Secondary | ICD-10-CM

## 2011-10-09 MED ORDER — OMEPRAZOLE 40 MG PO CPDR: 40.0000 mg | DELAYED_RELEASE_CAPSULE | Freq: Every day | ORAL | Status: DC

## 2011-10-09 NOTE — Assessment & Plan Note (Signed)
Anticipate skin reaction to tick bite, not lyme disease rash or other. Reassured, recommend elevation of leg as able and warm compresses to speed recovery. Red flags to seek care discussed.

## 2011-10-09 NOTE — Progress Notes (Signed)
  Subjective:    Patient ID: Patty Williams, female    DOB: [birth year], 51 y.o.   MRN: [id number]  HPI CC: tick bites  Requests change from zegerid to omeprazole to try this and then will let Dr. Karie Schwalbe know if it's working.  Had several tick bites, one started looking like getting infected.  Treated with peroxide.  Actually getting better.  Still somewhat itchy.  No target lesion.  Ticks were on for >1 day.  Bites occurred 5/27  No fevers/chills, abd pain, n/v, HA, neck stiffness, joint pains, rash.  Review of Systems Per HPI    Objective:   Physical Exam Bilateral legs with tick bites, left leg anterior lower shin with about 3cm surrounding nonblanching erythema, pruritic    Assessment & Plan:

## 2011-10-09 NOTE — Patient Instructions (Signed)
Skin looking ok today. Watch for fever, new rash, HA, neck stiffness, joint pains, abdominal pain or nausea/vomiting. I've sent in omeprazole 40 mg to try instead of zegerid

## 2011-10-09 NOTE — Assessment & Plan Note (Signed)
Changed from zegerid to plain omeprazole per pt preference, will f/u with PCP re effect.

## 2011-10-30 ENCOUNTER — Other Ambulatory Visit: Payer: 59

## 2011-11-06 ENCOUNTER — Other Ambulatory Visit: Payer: Self-pay

## 2011-11-06 ENCOUNTER — Ambulatory Visit: Payer: 59 | Admitting: Family Medicine

## 2011-11-06 ENCOUNTER — Other Ambulatory Visit: Payer: 59

## 2011-11-06 MED ORDER — LOSARTAN POTASSIUM 50 MG PO TABS: 50.0000 mg | ORAL_TABLET | Freq: Every day | ORAL | Status: DC

## 2011-11-06 NOTE — Telephone Encounter (Signed)
Pt request refill losartan # 90 x 3. Pt notified by phone refill sent in.

## 2011-11-13 ENCOUNTER — Ambulatory Visit: Payer: 59 | Admitting: Family Medicine

## 2011-11-27 ENCOUNTER — Other Ambulatory Visit: Payer: 59

## 2011-12-04 ENCOUNTER — Ambulatory Visit: Payer: 59 | Admitting: Family Medicine

## 2011-12-11 ENCOUNTER — Other Ambulatory Visit (INDEPENDENT_AMBULATORY_CARE_PROVIDER_SITE_OTHER): Payer: 59

## 2011-12-11 DIAGNOSIS — E119 Type 2 diabetes mellitus without complications: Secondary | ICD-10-CM

## 2011-12-11 DIAGNOSIS — E785 Hyperlipidemia, unspecified: Secondary | ICD-10-CM

## 2011-12-11 LAB — ALT: ALT: 22 U/L (ref 0–35)

## 2011-12-11 LAB — LIPID PANEL
Cholesterol: 188 mg/dL (ref 0–200)
VLDL: 15.4 mg/dL (ref 0.0–40.0)

## 2011-12-11 LAB — HEMOGLOBIN A1C: Hgb A1c MFr Bld: 6.1 % (ref 4.6–6.5)

## 2011-12-18 ENCOUNTER — Encounter: Payer: Self-pay | Admitting: Family Medicine

## 2011-12-18 ENCOUNTER — Ambulatory Visit (INDEPENDENT_AMBULATORY_CARE_PROVIDER_SITE_OTHER): Payer: 59 | Admitting: Family Medicine

## 2011-12-18 VITALS — BP 120/76 | HR 71 | Temp 98.2°F | Ht 65.0 in | Wt 215.0 lb

## 2011-12-18 DIAGNOSIS — E785 Hyperlipidemia, unspecified: Secondary | ICD-10-CM

## 2011-12-18 DIAGNOSIS — G5700 Lesion of sciatic nerve, unspecified lower limb: Secondary | ICD-10-CM

## 2011-12-18 DIAGNOSIS — G5701 Lesion of sciatic nerve, right lower limb: Secondary | ICD-10-CM | POA: Insufficient documentation

## 2011-12-18 DIAGNOSIS — R7309 Other abnormal glucose: Secondary | ICD-10-CM

## 2011-12-18 DIAGNOSIS — R739 Hyperglycemia, unspecified: Secondary | ICD-10-CM

## 2011-12-18 NOTE — Assessment & Plan Note (Signed)
Overall very stable with LDL under 130 Disc goals for lipids and reasons to control them Rev labs with pt Rev low sat fat diet in detail  F/u 6 mo

## 2011-12-18 NOTE — Patient Instructions (Addendum)
Labs are all stable- keep up the good work Work on better diet choices and diet control  Keep up exercise - stay open to different options  Follow up in 6 months for annual exam with labs prior

## 2011-12-18 NOTE — Assessment & Plan Note (Signed)
Has limited walking a bit  Will work more with the bike Disc trial of beginning yoga Will call if she needs PT ref
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
## 2011-12-18 NOTE — Assessment & Plan Note (Signed)
Stable with a1c stable at 6.1 Disc more strategies for wt loss/ diet/ exercise / less sugar F/u 6 mo

## 2012-02-09 ENCOUNTER — Other Ambulatory Visit: Payer: Self-pay | Admitting: *Deleted

## 2012-02-09 MED ORDER — OMEPRAZOLE 40 MG PO CPDR: 40.0000 mg | DELAYED_RELEASE_CAPSULE | Freq: Every day | ORAL | Status: DC

## 2012-03-11 ENCOUNTER — Telehealth: Payer: Self-pay

## 2012-03-11 NOTE — Telephone Encounter (Signed)
Pt left v/m requesting cpap order for sleep apnea; rx to read auto titration study 4-20 cms for 2 weeks. Fax rx to [facility] [phone].

## 2012-03-14 ENCOUNTER — Encounter: Payer: Self-pay | Admitting: Family Medicine

## 2012-03-14 NOTE — Telephone Encounter (Signed)
Done and in IN box 

## 2012-03-14 NOTE — Telephone Encounter (Signed)
Rx faxed

## 2012-03-14 NOTE — Telephone Encounter (Signed)
Pt called as courtesy re: her request for cpap order; Pt has been snoring more and not sleeping as well as usual. Sleep apnea not as well controlled. Pt request rx faxed today.

## 2012-03-15 ENCOUNTER — Encounter: Payer: Self-pay | Admitting: *Deleted

## 2012-05-11 DIAGNOSIS — K5790 Diverticulosis of intestine, part unspecified, without perforation or abscess without bleeding: Secondary | ICD-10-CM

## 2012-05-11 HISTORY — DX: Diverticulosis of intestine, part unspecified, without perforation or abscess without bleeding: K57.90

## 2012-06-10 ENCOUNTER — Other Ambulatory Visit: Payer: 59

## 2012-06-24 ENCOUNTER — Other Ambulatory Visit: Payer: 59

## 2012-06-24 ENCOUNTER — Encounter: Payer: 59 | Admitting: Family Medicine

## 2012-06-26 ENCOUNTER — Telehealth: Payer: Self-pay | Admitting: Family Medicine

## 2012-06-26 DIAGNOSIS — R739 Hyperglycemia, unspecified: Secondary | ICD-10-CM

## 2012-06-26 DIAGNOSIS — Z0000 Encounter for general adult medical examination without abnormal findings: Secondary | ICD-10-CM

## 2012-06-26 DIAGNOSIS — E785 Hyperlipidemia, unspecified: Secondary | ICD-10-CM

## 2012-06-26 NOTE — Telephone Encounter (Signed)
Message copied by Judy Pimple on Sun Jun 26, 2012 11:28 AM ------      Message from: Alvina Chou      Created: Fri Jun 17, 2012 10:38 AM      Regarding: Lab orders for  Friday, 2.14.14       Patient is scheduled for CPX labs, please order future labs, Thanks , Terri       ------

## 2012-07-01 ENCOUNTER — Encounter: Payer: 59 | Admitting: Family Medicine

## 2012-07-08 ENCOUNTER — Other Ambulatory Visit (INDEPENDENT_AMBULATORY_CARE_PROVIDER_SITE_OTHER): Payer: 59

## 2012-07-08 DIAGNOSIS — E785 Hyperlipidemia, unspecified: Secondary | ICD-10-CM

## 2012-07-08 DIAGNOSIS — R739 Hyperglycemia, unspecified: Secondary | ICD-10-CM

## 2012-07-08 DIAGNOSIS — Z0000 Encounter for general adult medical examination without abnormal findings: Secondary | ICD-10-CM

## 2012-07-08 DIAGNOSIS — R7309 Other abnormal glucose: Secondary | ICD-10-CM

## 2012-07-08 LAB — COMPREHENSIVE METABOLIC PANEL
Albumin: 3.6 g/dL (ref 3.5–5.2)
CO2: 28 mEq/L (ref 19–32)
Calcium: 8.8 mg/dL (ref 8.4–10.5)
GFR: 96.46 mL/min (ref >60.00)
Glucose, Bld: 101 mg/dL — ABNORMAL HIGH (ref 70–99)
Potassium: 4 mEq/L (ref 3.5–5.1)
Sodium: 141 mEq/L (ref 135–145)
Total Protein: 7.1 g/dL (ref 6.0–8.3)

## 2012-07-08 LAB — CBC WITH DIFFERENTIAL/PLATELET
Basophils Relative: 0.5 % (ref 0.0–3.0)
Eosinophils Relative: 2.2 % (ref 0.0–5.0)
HCT: 37.8 % (ref 36.0–46.0)
Monocytes Relative: 7.9 % (ref 3.0–12.0)
Neutrophils Relative %: 57.1 % (ref 43.0–77.0)
Platelets: 309 10*3/uL (ref 150.0–400.0)
RBC: 4.25 Mil/uL (ref 3.87–5.11)
WBC: 6.8 10*3/uL (ref 4.5–10.5)

## 2012-07-08 LAB — TSH: TSH: 0.74 u[IU]/mL (ref 0.35–5.50)

## 2012-07-08 LAB — HEMOGLOBIN A1C: Hgb A1c MFr Bld: 6.1 % (ref 4.6–6.5)

## 2012-07-08 LAB — LIPID PANEL: VLDL: 14.6 mg/dL (ref 0.0–40.0)

## 2012-07-15 ENCOUNTER — Encounter: Payer: 59 | Admitting: Family Medicine

## 2012-09-30 ENCOUNTER — Ambulatory Visit (INDEPENDENT_AMBULATORY_CARE_PROVIDER_SITE_OTHER): Payer: BC Managed Care – PPO | Admitting: Family Medicine

## 2012-09-30 ENCOUNTER — Other Ambulatory Visit: Payer: Self-pay | Admitting: *Deleted

## 2012-09-30 ENCOUNTER — Encounter: Payer: Self-pay | Admitting: Family Medicine

## 2012-09-30 VITALS — BP 122/72 | HR 70 | Temp 98.5°F | Ht 65.0 in | Wt 222.8 lb

## 2012-09-30 DIAGNOSIS — I10 Essential (primary) hypertension: Secondary | ICD-10-CM

## 2012-09-30 DIAGNOSIS — E669 Obesity, unspecified: Secondary | ICD-10-CM

## 2012-09-30 DIAGNOSIS — R739 Hyperglycemia, unspecified: Secondary | ICD-10-CM

## 2012-09-30 DIAGNOSIS — R7309 Other abnormal glucose: Secondary | ICD-10-CM

## 2012-09-30 DIAGNOSIS — Z0000 Encounter for general adult medical examination without abnormal findings: Secondary | ICD-10-CM

## 2012-09-30 MED ORDER — LOSARTAN POTASSIUM 50 MG PO TABS: 50.0000 mg | ORAL_TABLET | Freq: Every day | ORAL | Status: DC

## 2012-09-30 MED ORDER — FLUTICASONE-SALMETEROL 100-50 MCG/DOSE IN AEPB: 1.0000 | INHALATION_SPRAY | Freq: Two times a day (BID) | RESPIRATORY_TRACT | Status: DC

## 2012-09-30 MED ORDER — NORETHIN-ETH ESTRAD-FE BIPHAS 1 MG-10 MCG / 10 MCG PO TABS: 1.0000 | ORAL_TABLET | Freq: Every day | ORAL | Status: DC

## 2012-09-30 MED ORDER — OMEPRAZOLE 40 MG PO CPDR: 40.0000 mg | DELAYED_RELEASE_CAPSULE | Freq: Every day | ORAL | Status: DC

## 2012-09-30 MED ORDER — ALBUTEROL SULFATE HFA 108 (90 BASE) MCG/ACT IN AERS: 2.0000 | INHALATION_SPRAY | RESPIRATORY_TRACT | Status: DC | PRN

## 2012-09-30 NOTE — Patient Instructions (Addendum)
Keep working on [employer] and exercise Try to cross train to prevent injury  Take care of yourself  Follow up in about 6 months

## 2012-09-30 NOTE — Telephone Encounter (Signed)
Pt was seen today for OV and forgot to request meds refilled, I sent Rx to new pharmacy
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
## 2012-10-02 NOTE — Assessment & Plan Note (Signed)
Discussed how this problem influences overall health and the risks it imposes  Reviewed plan for weight loss with lower calorie diet (via better food choices and also portion control or program like weight watchers) and exercise building up to or more than 30 minutes 5 days per week including some aerobic activity    

## 2012-10-02 NOTE — Assessment & Plan Note (Signed)
Reviewed health habits including diet and exercise and skin cancer prevention Also reviewed health mt list, fam hx and immunizations  Wellness labs reviewed today

## 2012-10-02 NOTE — Assessment & Plan Note (Signed)
bp is stable today  No cp or palpitations or headaches or edema  No side effects to medicines  BP Readings from Last 3 Encounters:  09/30/12 122/72  12/18/11 120/76  10/09/11 104/70

## 2012-10-02 NOTE — Assessment & Plan Note (Signed)
Lab Results  Component Value Date   HGBA1C 6.1 07/08/2012    Overall stable despite wt gain Enc to work on wt loss and low glycemic diet

## 2012-10-17 ENCOUNTER — Other Ambulatory Visit: Payer: Self-pay | Admitting: Obstetrics

## 2012-10-17 DIAGNOSIS — N824 Other female intestinal-genital tract fistulae: Secondary | ICD-10-CM

## 2012-10-19 ENCOUNTER — Other Ambulatory Visit: Payer: BC Managed Care – PPO

## 2012-10-21 ENCOUNTER — Ambulatory Visit
Admission: RE | Admit: 2012-10-21 | Discharge: 2012-10-21 | Disposition: A | Discharge status: Home / Self Care | Payer: BC Managed Care – PPO | Source: Ambulatory Visit | Attending: Obstetrics | Admitting: Obstetrics

## 2012-10-21 DIAGNOSIS — N824 Other female intestinal-genital tract fistulae: Secondary | ICD-10-CM

## 2012-10-21 MED ORDER — IOHEXOL 300 MG/ML  SOLN
125.0000 mL | Freq: Once | INTRAMUSCULAR | Status: AC | PRN
  Administered 2012-10-21: 125 mL via INTRAVENOUS

## 2012-11-22 ENCOUNTER — Other Ambulatory Visit: Payer: Self-pay | Admitting: Obstetrics

## 2012-11-22 DIAGNOSIS — N824 Other female intestinal-genital tract fistulae: Secondary | ICD-10-CM

## 2012-12-02 ENCOUNTER — Ambulatory Visit
Admission: RE | Admit: 2012-12-02 | Discharge: 2012-12-02 | Disposition: A | Discharge status: Home / Self Care | Payer: BC Managed Care – PPO | Source: Ambulatory Visit | Attending: Obstetrics | Admitting: Obstetrics

## 2012-12-02 DIAGNOSIS — N824 Other female intestinal-genital tract fistulae: Secondary | ICD-10-CM

## 2012-12-02 MED ORDER — IOHEXOL 300 MG/ML  SOLN
100.0000 mL | Freq: Once | INTRAMUSCULAR | Status: AC | PRN
  Administered 2012-12-02: 100 mL via INTRAVENOUS

## 2012-12-16 ENCOUNTER — Other Ambulatory Visit: Payer: BC Managed Care – PPO

## 2012-12-16 ENCOUNTER — Encounter: Payer: Self-pay | Admitting: Internal Medicine

## 2012-12-16 ENCOUNTER — Ambulatory Visit (INDEPENDENT_AMBULATORY_CARE_PROVIDER_SITE_OTHER): Payer: BC Managed Care – PPO | Admitting: Internal Medicine

## 2012-12-16 VITALS — BP 128/78 | HR 80 | Ht 67.0 in | Wt 227.2 lb

## 2012-12-16 DIAGNOSIS — G4733 Obstructive sleep apnea (adult) (pediatric): Secondary | ICD-10-CM

## 2012-12-16 DIAGNOSIS — J302 Other seasonal allergic rhinitis: Secondary | ICD-10-CM

## 2012-12-16 DIAGNOSIS — J309 Allergic rhinitis, unspecified: Secondary | ICD-10-CM

## 2012-12-16 MED ORDER — AZELASTINE-FLUTICASONE 137-50 MCG/ACT NA SUSP: 1.0000 | Freq: Every day | NASAL | Status: DC

## 2012-12-16 NOTE — Patient Instructions (Addendum)
Order- DME Advanced set CPAP fixed 18, continue mask of choice, humidifier, supplies   Dx OSA  Sample Dymista nasal spray    1-2 puffs each nostril once daily   Order- lab Allergy Profile    Dx Allergic rhinitis  You can see your own ENT to look at the deviated septum

## 2012-12-16 NOTE — Progress Notes (Signed)
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  [facility] [facility] [place].  Currently wearing cpap - snoring with cpap on.   Had autotitration and asks if her pressure needs to be changed. Has been using CPAP 12/Advanced with full face mask. Naps and  morning caffeine  help daytime sleepiness. Bedtime 10 PM, sleep latency 10 minutes, waking none or once before up at 5:15 AM. Perennial allergic rhinitis with blowing and stuffiness. Asthma well controlled with one puff of Advair daily. Treated hypertension. ENT surgery for T&A. NPSG 09/23/06- [facility] 23/ hr  Prior to Admission medications   Medication Sig Start Date End Date Taking? Authorizing Provider  albuterol (PROVENTIL HFA) 108 (90 BASE) MCG/ACT inhaler Inhale 2 puffs into the lungs every 4 (four) hours as needed. 09/30/12  Yes Judy Pimple, MD  ferrous sulfate 325 (65 FE) MG tablet Take 325 mg by mouth daily with breakfast.    Yes Historical Provider, MD  Fluticasone-Salmeterol (ADVAIR) 100-50 MCG/DOSE AEPB Inhale 1 puff into the lungs 2 (two) times daily. 09/30/12  Yes Judy Pimple, MD  losartan (COZAAR) 50 MG tablet Take 1 tablet (50 mg total) by mouth daily. 09/30/12  Yes Judy Pimple, MD  Multiple Vitamin (MULTIVITAMIN) tablet Take 1 tablet by mouth daily.     Yes Historical Provider, MD  Norethindrone-Ethinyl Estradiol-Fe Biphas (LO LOESTRIN FE) 1 MG-10 MCG / 10 MCG tablet Take 1 tablet by mouth daily. 09/30/12  Yes Judy Pimple, MD  omeprazole (PRILOSEC) 40 MG capsule Take 1 capsule (40 mg total) by mouth daily. 09/30/12 09/30/13 Yes Marne A Tower, MD  Azelastine HCl 0.15 % SOLN Place 1-2 sprays into the nose 2 (two) times daily. 12/26/12   Waymon Budge, MD  Azelastine-Fluticasone (DYMISTA) 137-50 MCG/ACT SUSP Place 1-2 puffs into the nose daily. 12/16/12   Waymon Budge, MD  fluticasone (FLONASE) 50 MCG/ACT nasal spray Place 1-2 sprays into the nose at bedtime. 12/26/12   Waymon Budge, MD   Past Medical History  Diagnosis Date   . Obesity   . Stress reaction   . Elevated blood pressure reading without diagnosis of hypertension   . Endometriosis   . GERD (gastroesophageal reflux disease)   . ADD (attention deficit disorder) 12/2006    adult  . Asthma   . HLD (hyperlipidemia)   . Diabetes mellitus type II     mild  . OSA on CPAP   . Plantar fasciitis     with orthotics-much difficulty adjusting these   Past Surgical History  Procedure Laterality Date  . Hemorrhoid surgery    . Uterine suspension    . Wrist fracture surgery    . Laparoscopy      endometriosis   Family History  Problem Relation Age of Onset  . Cancer Father     prostate  . Thyroid disease Mother     ?  . Diabetes Mother   . Hypertension Mother   . Heart attack      grandfather (in his 22's)  . Parkinsonism      grandfater   History   Social History  . Marital Status: Single    Spouse Name: N/A    Number of Children: N/A  . Years of Education: N/A   Occupational History  . Dentist    Social History Main Topics  . Smoking status: Never Smoker   . Smokeless tobacco: Never Used  . Alcohol Use: Yes     Comment:  social  . Drug Use: No  . Sexual Activity: Not on file   Other Topics Concern  . Not on file   Social History Narrative   Permanent life partner-female   ROS-see HPI Constitutional:   No-   weight loss, night sweats, fevers, chills,+ fatigue, lassitude. HEENT:   No-  headaches, difficulty swallowing, tooth/dental problems, sore throat,       No-  sneezing, itching, ear ache, +nasal congestion, post nasal drip,  CV:  No-   chest pain, orthopnea, PND, swelling in lower extremities, anasarca, dizziness, palpitations Resp: No-   shortness of breath with exertion or at rest.              No-   productive cough,  No non-productive cough,  No- coughing up of blood.              No-   change in color of mucus.  No- wheezing.   Skin: No-   rash or lesions. GI:  No-   heartburn, indigestion, abdominal pain, nausea,  vomiting, diarrhea,                 change in bowel habits, loss of appetite GU: No-   dysuria, change in color of urine, no urgency or frequency.  No- flank pain. MS:  No-   joint pain or swelling.  No- decreased range of motion.  No- back pain. Neuro-     nothing unusual Psych:  No- change in mood or affect. No depression or anxiety.  No memory loss.  OBJ- Physical Exam General- Alert, Oriented, Affect-appropriate, Distress- none acute, overweight/ stocky build Skin- rash-none, lesions- none, excoriation- none Lymphadenopathy- none Head- atraumatic            Eyes- Gross vision intact, PERRLA, conjunctivae and secretions clear            Ears- Hearing, canals-normal            Nose- Clear, +Septal dev, no-mucus, polyps, erosion, perforation             Throat- Mallampati II , mucosa clear , drainage- none, tonsils- atrophic Neck- flexible , trachea midline, no stridor , thyroid nl, carotid no bruit Chest - symmetrical excursion , unlabored           Heart/CV- RRR , no murmur , no gallop  , no rub, nl s1 s2                           - JVD- none , edema- none, stasis changes- none, varices- none           Lung- clear to P&A, wheeze- none, cough- none , dullness-none, rub- none           Chest wall-  Abd- tender-no, distended-no, bowel sounds-present, HSM- no Br/ Gen/ Rectal- Not done, not indicated Extrem- cyanosis- none, clubbing, none, atrophy- none, strength- nl Neuro- grossly intact to observation

## 2012-12-19 LAB — ALLERGY FULL PROFILE
Allergen, D pternoyssinus,d7: <0.1 kU/L
Allergen,Goose feathers, e70: <0.1 kU/L
Aspergillus fumigatus, m3: <0.1 kU/L
Bermuda Grass: <0.1 kU/L
Candida Albicans: <0.1 kU/L
Common Ragweed: <0.1 kU/L
D. farinae: <0.1 kU/L
Dog Dander: 0.37 kU/L — ABNORMAL HIGH
Goldenrod: <0.1 kU/L
Helminthosporium halodes: <0.1 kU/L
House Dust Hollister: 0.1 kU/L — ABNORMAL HIGH
IgE (Immunoglobulin E), Serum: 71.6 IU/mL (ref 0.0–180.0)
Lamb's Quarters: <0.1 kU/L
Oak: <0.1 kU/L
Plantain: <0.1 kU/L
Stemphylium Botryosum: <0.1 kU/L
Timothy Grass: <0.1 kU/L

## 2012-12-21 NOTE — Progress Notes (Signed)
Quick Note:  LMTCB ______ 

## 2012-12-23 ENCOUNTER — Telehealth: Payer: Self-pay | Admitting: Internal Medicine

## 2012-12-23 NOTE — Telephone Encounter (Signed)
Notes Recorded by Waymon Budge, MD on 12/19/2012 at 5:14 PM Allergy antibody levels are elevated for cat, dog and hose dust. Try to minimize those exposures and we will discuss at next ov.   I spoke with patient about results and she verbalized understanding and had no questions. She is wanting 2 separate RX's for the chemical combination of dymista. She stated she does not believe her insurance will cover this. She has dymista left this is for when she runs out. Please advise Dr. Maple Hudson thanks

## 2012-12-25 NOTE — Telephone Encounter (Signed)
Ok to script flonase/ fluticasone #1, 1-2 puffs each nostril once daily at bedtime                      Astepro/ azelastine 1, 1-2 puffs each nostril, once or twice daily - This can be skipped if not needed.

## 2012-12-26 MED ORDER — FLUTICASONE PROPIONATE 50 MCG/ACT NA SUSP: 1.0000 | Freq: Every day | NASAL | Status: DC

## 2012-12-26 MED ORDER — AZELASTINE HCL 0.15 % NA SOLN: 1.0000 | Freq: Two times a day (BID) | NASAL | Status: DC

## 2012-12-26 NOTE — Telephone Encounter (Signed)
Pt advised. Jennifer Castillo, CMA  

## 2012-12-26 NOTE — Telephone Encounter (Signed)
I [id number] to verify what pharmacy the pt wants rx sent to and advise of directions. Carron Curie, CMA

## 2012-12-26 NOTE — Telephone Encounter (Signed)
Pt is available to 1:00 for a call back

## 2013-01-01 DIAGNOSIS — J302 Other seasonal allergic rhinitis: Secondary | ICD-10-CM | POA: Insufficient documentation

## 2013-01-01 NOTE — Assessment & Plan Note (Signed)
She had done an auto titration and we are seeking that report. Plan-try CPAP pressure 18 fixed. She will see her own ENT in [place] about deviated septum.

## 2013-01-01 NOTE — Assessment & Plan Note (Signed)
Plan-she will see her ENT to discuss the pros and cons of possible septoplasty. Try sample Dymista nasal spray. Lab for Allergy Profile

## 2013-01-11 NOTE — Progress Notes (Signed)
Quick Note:  LMOM TCB x2. ______ 

## 2013-02-10 ENCOUNTER — Encounter: Payer: Self-pay | Admitting: Family Medicine

## 2013-02-24 ENCOUNTER — Ambulatory Visit (INDEPENDENT_AMBULATORY_CARE_PROVIDER_SITE_OTHER): Payer: BC Managed Care – PPO | Admitting: Internal Medicine

## 2013-02-24 ENCOUNTER — Encounter: Payer: Self-pay | Admitting: Internal Medicine

## 2013-02-24 VITALS — BP 122/66 | HR 78 | Ht 67.0 in | Wt 215.8 lb

## 2013-02-24 DIAGNOSIS — J302 Other seasonal allergic rhinitis: Secondary | ICD-10-CM

## 2013-02-24 DIAGNOSIS — J309 Allergic rhinitis, unspecified: Secondary | ICD-10-CM

## 2013-02-24 DIAGNOSIS — G4733 Obstructive sleep apnea (adult) (pediatric): Secondary | ICD-10-CM

## 2013-02-24 NOTE — Progress Notes (Signed)
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  [facility] 2008.  Currently wearing cpap - snoring with cpap on.   Had autotitration and asks if her pressure needs to be changed. Has been using CPAP 12/Advanced with full face mask. Naps and  morning caffeine  help daytime sleepiness. Bedtime 10 PM, sleep latency 10 minutes, waking none or once before up at 5:15 AM. Perennial allergic rhinitis with blowing and stuffiness. Asthma well controlled with one puff of Advair daily. Treated hypertension. ENT surgery for T&A. 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 Followed for OSA, allergic rhinitis, vasomotor rhinitis -  FOLLOWS FOR: wears CPAP 16/ Advanced every night for about 6-7 hours; pressure is working well for patient; DME is [facility]. Denies any flare ups of allergies at this time.  Likes Astepro nasal spray.  Allergy profile- 12/16/12- Total IgE 71.6, elevations for cat, dog, house dust  ROS-see HPI Constitutional:   No-   weight loss, night sweats, fevers, chills,+ fatigue, lassitude. HEENT:   No-  headaches, difficulty swallowing, tooth/dental problems, sore throat,       No-  sneezing, itching, ear ache, +nasal congestion, post nasal drip,  CV:  No-   chest pain, orthopnea, PND, swelling in lower extremities, anasarca, dizziness, palpitations Resp: No-   shortness of breath with exertion or at rest.              No-   productive cough,  No non-productive cough,  No- coughing up of blood.              No-   change in color of mucus.  No- wheezing.   Skin: No-   rash or lesions. GI:  No-   heartburn, indigestion, abdominal pain, nausea, vomiting,  GU: . MS:  No-   joint pain or swelling.  Neuro-     nothing unusual Psych:  No- change in mood or affect. No depression or anxiety.  No memory loss.  OBJ- Physical Exam General- Alert, Oriented, Affect-appropriate, Distress- none acute, overweight/ stocky build Skin- rash-none,  lesions- none, excoriation- none Lymphadenopathy- none Head- atraumatic            Eyes- Gross vision intact, PERRLA, conjunctivae and secretions clear            Ears- Hearing, canals-normal            Nose- Clear, +Septal dev, no-mucus, polyps, erosion, perforation             Throat- Mallampati II , mucosa clear , drainage- none, tonsils- atrophic Neck- flexible , trachea midline, no stridor , thyroid nl, carotid no bruit Chest - symmetrical excursion , unlabored           Heart/CV- RRR , no murmur , no gallop  , no rub, nl s1 s2                           - JVD- none , edema- none, stasis changes- none, varices- none           Lung- clear to P&A, wheeze- none, cough- none , dullness-none, rub- none           Chest wall-  Abd-  Br/ Gen/ Rectal- Not done, not indicated Extrem- cyanosis- none, clubbing, none, atrophy- none, strength- nl Neuro- grossly intact to observation

## 2013-02-24 NOTE — Patient Instructions (Signed)
We can continue CPAP/ Advanced  Ok to use the Astepro nasal spray as needed  Consider the information on environmental allergen control  Please call as needed

## 2013-03-06 ENCOUNTER — Other Ambulatory Visit: Payer: Self-pay | Admitting: Family Medicine

## 2013-03-06 MED ORDER — OMEPRAZOLE 40 MG PO CPDR: 40.0000 mg | DELAYED_RELEASE_CAPSULE | Freq: Every day | ORAL | Status: DC

## 2013-03-06 MED ORDER — LOSARTAN POTASSIUM 50 MG PO TABS: 50.0000 mg | ORAL_TABLET | Freq: Every day | ORAL | Status: DC

## 2013-03-12 NOTE — Assessment & Plan Note (Signed)
Allergy profile- 12/16/12- Total IgE 71.6, elevations for cat, dog, house dust Probably allergic and non-allergic rhinitis Plan educated on environmental allergen control

## 2013-03-12 NOTE — Assessment & Plan Note (Addendum)
Good compliance and control now on 16 cwp/ Advanced

## 2013-03-29 ENCOUNTER — Telehealth: Payer: Self-pay | Admitting: Family Medicine

## 2013-03-29 DIAGNOSIS — I10 Essential (primary) hypertension: Secondary | ICD-10-CM

## 2013-03-29 DIAGNOSIS — E785 Hyperlipidemia, unspecified: Secondary | ICD-10-CM

## 2013-03-29 DIAGNOSIS — R739 Hyperglycemia, unspecified: Secondary | ICD-10-CM

## 2013-03-29 NOTE — Telephone Encounter (Signed)
Message copied by Judy Pimple on Wed Mar 29, 2013 10:34 AM ------      Message from: Alvina Chou      Created: Thu Mar 23, 2013  9:48 AM      Regarding: Lab orders for Friday, 11.21.14       Lab orders for a 6 month f/u ------

## 2013-03-31 ENCOUNTER — Other Ambulatory Visit (INDEPENDENT_AMBULATORY_CARE_PROVIDER_SITE_OTHER): Payer: BC Managed Care – PPO

## 2013-03-31 DIAGNOSIS — E785 Hyperlipidemia, unspecified: Secondary | ICD-10-CM

## 2013-03-31 DIAGNOSIS — I10 Essential (primary) hypertension: Secondary | ICD-10-CM

## 2013-03-31 DIAGNOSIS — R739 Hyperglycemia, unspecified: Secondary | ICD-10-CM

## 2013-03-31 DIAGNOSIS — R7309 Other abnormal glucose: Secondary | ICD-10-CM

## 2013-03-31 DIAGNOSIS — E669 Obesity, unspecified: Secondary | ICD-10-CM

## 2013-03-31 LAB — LIPID PANEL
Cholesterol: 145 mg/dL (ref 0–200)
HDL: 33.6 mg/dL — ABNORMAL LOW (ref >39.00)
Total CHOL/HDL Ratio: 4
Triglycerides: 63 mg/dL (ref 0.0–149.0)

## 2013-03-31 LAB — COMPREHENSIVE METABOLIC PANEL
ALT: 20 U/L (ref 0–35)
AST: 17 U/L (ref 0–37)
Alkaline Phosphatase: 57 U/L (ref 39–117)
Creatinine, Ser: 0.6 mg/dL (ref 0.4–1.2)
GFR: 105.05 mL/min (ref >60.00)
Sodium: 138 mEq/L (ref 135–145)
Total Bilirubin: 0.7 mg/dL (ref 0.3–1.2)
Total Protein: 7 g/dL (ref 6.0–8.3)

## 2013-04-13 ENCOUNTER — Other Ambulatory Visit: Payer: Self-pay | Admitting: Internal Medicine

## 2013-04-13 MED ORDER — AZELASTINE HCL 0.15 % NA SOLN: 1.0000 | Freq: Two times a day (BID) | NASAL | Status: DC

## 2013-04-14 ENCOUNTER — Ambulatory Visit: Payer: BC Managed Care – PPO | Admitting: Family Medicine

## 2013-04-14 ENCOUNTER — Encounter: Payer: Self-pay | Admitting: Family Medicine

## 2013-04-14 ENCOUNTER — Ambulatory Visit (INDEPENDENT_AMBULATORY_CARE_PROVIDER_SITE_OTHER): Payer: BC Managed Care – PPO | Admitting: Family Medicine

## 2013-04-14 VITALS — BP 114/72 | HR 60 | Temp 98.4°F | Ht 66.0 in | Wt 200.5 lb

## 2013-04-14 DIAGNOSIS — I10 Essential (primary) hypertension: Secondary | ICD-10-CM

## 2013-04-14 DIAGNOSIS — E669 Obesity, unspecified: Secondary | ICD-10-CM

## 2013-04-14 DIAGNOSIS — E785 Hyperlipidemia, unspecified: Secondary | ICD-10-CM

## 2013-04-14 DIAGNOSIS — R7309 Other abnormal glucose: Secondary | ICD-10-CM

## 2013-04-14 DIAGNOSIS — R739 Hyperglycemia, unspecified: Secondary | ICD-10-CM

## 2013-04-14 NOTE — Progress Notes (Signed)
Pre-visit discussion using our clinic review tool. No additional management support is needed unless otherwise documented below in the visit note.  

## 2013-04-14 NOTE — Patient Instructions (Signed)
Keep up the great work with lifestyle change and weight loss! Work on gradually increasing exercise  Labs are overall stable  Take fish oil if not already 1000 -3000 mg daily for HDL  Follow up in 6 months for annual exam with labs prior
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
## 2013-04-16 NOTE — Assessment & Plan Note (Signed)
Lab Results  Component Value Date   HGBA1C 6.2 03/31/2013    Overall stable Enc further work on low glycemic diet and exercise and wt loss Commended on work done so far

## 2013-04-16 NOTE — Assessment & Plan Note (Signed)
BP: 114/72 mmHg   bp in fair control at this time  No changes needed Disc lifstyle change with low sodium diet and exercise   Labs reviewed

## 2013-04-16 NOTE — Assessment & Plan Note (Signed)
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

## 2013-04-16 NOTE — Assessment & Plan Note (Signed)
Disc goals for lipids and reasons to control them Rev labs with pt Rev low sat fat diet in detail Stable with low HDL Enc more exercise  Will also try fish oild 6 mo f/u

## 2013-04-26 ENCOUNTER — Other Ambulatory Visit: Payer: Self-pay | Admitting: Family Medicine

## 2013-04-26 MED ORDER — FLUTICASONE-SALMETEROL 100-50 MCG/DOSE IN AEPB: 1.0000 | INHALATION_SPRAY | Freq: Two times a day (BID) | RESPIRATORY_TRACT | Status: DC

## 2013-06-05 ENCOUNTER — Telehealth: Payer: Self-pay

## 2013-06-05 NOTE — Telephone Encounter (Signed)
[facility] told pt losartan refill had expired; spoke with Raquel Sarna at [place] [phone] and refill is available and will begin shipping process today. Pt notified.

## 2013-07-31 ENCOUNTER — Other Ambulatory Visit: Payer: Self-pay | Admitting: Family Medicine

## 2013-08-25 ENCOUNTER — Ambulatory Visit: Payer: BC Managed Care – PPO | Admitting: Internal Medicine

## 2013-09-28 ENCOUNTER — Telehealth: Payer: Self-pay | Admitting: Family Medicine

## 2013-09-28 DIAGNOSIS — E785 Hyperlipidemia, unspecified: Secondary | ICD-10-CM

## 2013-09-28 DIAGNOSIS — R739 Hyperglycemia, unspecified: Secondary | ICD-10-CM

## 2013-09-28 DIAGNOSIS — Z0000 Encounter for general adult medical examination without abnormal findings: Secondary | ICD-10-CM

## 2013-09-28 NOTE — Telephone Encounter (Signed)
Message copied by Abner Greenspan on Thu Sep 28, 2013  3:46 PM ------      Message from: Ellamae Sia      Created: Wed Sep 20, 2013  5:07 PM      Regarding: Lab orders for Friday, 5.22.15       Patient is scheduled for CPX labs, please order future labs, Thanks , Terri       ------

## 2013-09-29 ENCOUNTER — Other Ambulatory Visit (INDEPENDENT_AMBULATORY_CARE_PROVIDER_SITE_OTHER): Payer: No Typology Code available for payment source

## 2013-09-29 DIAGNOSIS — Z0000 Encounter for general adult medical examination without abnormal findings: Secondary | ICD-10-CM

## 2013-09-29 DIAGNOSIS — R739 Hyperglycemia, unspecified: Secondary | ICD-10-CM

## 2013-09-29 DIAGNOSIS — R7309 Other abnormal glucose: Secondary | ICD-10-CM

## 2013-09-29 LAB — CBC WITH DIFFERENTIAL/PLATELET
BASOS PCT: 0.4 % (ref 0.0–3.0)
Basophils Absolute: 0 10*3/uL (ref 0.0–0.1)
EOS ABS: 0.1 10*3/uL (ref 0.0–0.7)
EOS PCT: 2.2 % (ref 0.0–5.0)
HCT: 39.8 % (ref 36.0–46.0)
Hemoglobin: 13.1 g/dL (ref 12.0–15.0)
LYMPHS PCT: 33.8 % (ref 12.0–46.0)
Lymphs Abs: 1.9 10*3/uL (ref 0.7–4.0)
MCHC: 33 g/dL (ref 30.0–36.0)
MCV: 92.2 fl (ref 78.0–100.0)
Monocytes Absolute: 0.4 10*3/uL (ref 0.1–1.0)
Monocytes Relative: 6.7 % (ref 3.0–12.0)
Neutro Abs: 3.3 10*3/uL (ref 1.4–7.7)
Neutrophils Relative %: 56.9 % (ref 43.0–77.0)
Platelets: 285 10*3/uL (ref 150.0–400.0)
RBC: 4.32 Mil/uL (ref 3.87–5.11)
RDW: 14.2 % (ref 11.5–15.5)
WBC: 5.8 10*3/uL (ref 4.0–10.5)

## 2013-09-29 LAB — COMPREHENSIVE METABOLIC PANEL
ALBUMIN: 3.7 g/dL (ref 3.5–5.2)
ALT: 18 U/L (ref 0–35)
AST: 18 U/L (ref 0–37)
Alkaline Phosphatase: 44 U/L (ref 39–117)
BUN: 21 mg/dL (ref 6–23)
CALCIUM: 9.1 mg/dL (ref 8.4–10.5)
CHLORIDE: 102 meq/L (ref 96–112)
CO2: 30 meq/L (ref 19–32)
Creatinine, Ser: 0.6 mg/dL (ref 0.4–1.2)
GFR: 115.35 mL/min (ref >60.00)
GLUCOSE: 108 mg/dL — AB (ref 70–99)
Potassium: 3.9 mEq/L (ref 3.5–5.1)
Sodium: 140 mEq/L (ref 135–145)
Total Bilirubin: 0.7 mg/dL (ref 0.2–1.2)
Total Protein: 7 g/dL (ref 6.0–8.3)

## 2013-09-29 LAB — LIPID PANEL
CHOLESTEROL: 193 mg/dL (ref 0–200)
HDL: 40.2 mg/dL (ref >39.00)
LDL CALC: 135 mg/dL — AB (ref 0–99)
TRIGLYCERIDES: 91 mg/dL (ref 0.0–149.0)
Total CHOL/HDL Ratio: 5
VLDL: 18.2 mg/dL (ref 0.0–40.0)

## 2013-09-29 LAB — HEMOGLOBIN A1C: Hgb A1c MFr Bld: 6.1 % (ref 4.6–6.5)

## 2013-09-29 LAB — TSH: TSH: 1.06 u[IU]/mL (ref 0.35–4.50)

## 2013-10-06 ENCOUNTER — Encounter: Payer: Self-pay | Admitting: Family Medicine

## 2013-10-06 ENCOUNTER — Ambulatory Visit: Payer: BC Managed Care – PPO | Admitting: Internal Medicine

## 2013-10-06 ENCOUNTER — Ambulatory Visit (INDEPENDENT_AMBULATORY_CARE_PROVIDER_SITE_OTHER): Payer: No Typology Code available for payment source | Admitting: Family Medicine

## 2013-10-06 VITALS — BP 98/62 | HR 67 | Temp 98.4°F | Ht 65.0 in | Wt 173.0 lb

## 2013-10-06 DIAGNOSIS — N390 Urinary tract infection, site not specified: Secondary | ICD-10-CM | POA: Insufficient documentation

## 2013-10-06 DIAGNOSIS — Z0000 Encounter for general adult medical examination without abnormal findings: Secondary | ICD-10-CM

## 2013-10-06 DIAGNOSIS — E785 Hyperlipidemia, unspecified: Secondary | ICD-10-CM

## 2013-10-06 DIAGNOSIS — I10 Essential (primary) hypertension: Secondary | ICD-10-CM

## 2013-10-06 DIAGNOSIS — R739 Hyperglycemia, unspecified: Secondary | ICD-10-CM

## 2013-10-06 DIAGNOSIS — R7309 Other abnormal glucose: Secondary | ICD-10-CM

## 2013-10-06 LAB — POCT URINALYSIS DIPSTICK
Bilirubin, UA: NEGATIVE
Glucose, UA: NEGATIVE
KETONES UA: NEGATIVE
Nitrite, UA: NEGATIVE
PH UA: 7
Protein, UA: NEGATIVE
UROBILINOGEN UA: 0.2

## 2013-10-06 MED ORDER — LOSARTAN POTASSIUM 50 MG PO TABS: ORAL_TABLET | ORAL | Status: DC

## 2013-10-06 MED ORDER — HYDROCORTISONE ACETATE 25 MG RE SUPP: 25.0000 mg | Freq: Two times a day (BID) | RECTAL | Status: DC

## 2013-10-06 NOTE — Progress Notes (Signed)
Pre visit review using our clinic review tool, if applicable. No additional management support is needed unless otherwise documented below in the visit note. 
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
## 2013-10-06 NOTE — Patient Instructions (Signed)
Give a urine specimen to dip on the way out  Make sure you are getting enough carbs (in small amounts) Cholesterol is up -   Avoid red meat/ fried foods/ egg yolks/ fatty breakfast meats/ butter, cheese and high fat dairy/ and shellfish   Cut losartan to 1/2 pill daily     Fat and Cholesterol Control Diet Fat and cholesterol levels in your blood and organs are influenced by your diet. High levels of fat and cholesterol may lead to diseases of the heart, small and large blood vessels, gallbladder, liver, and pancreas. CONTROLLING FAT AND CHOLESTEROL WITH DIET Although exercise and lifestyle factors are important, your diet is key. That is because certain foods are known to raise cholesterol and others to lower it. The goal is to balance foods for their effect on cholesterol and more importantly, to replace saturated and trans fat with other types of fat, such as monounsaturated fat, polyunsaturated fat, and omega-3 fatty acids. On average, a person should consume no more than 15 to 17 g of saturated fat daily. Saturated and trans fats are considered "bad" fats, and they will raise LDL cholesterol. Saturated fats are primarily found in animal products such as meats, butter, and cream. However, that does not mean you need to give up all your favorite foods. Today, there are good tasting, low-fat, low-cholesterol substitutes for most of the things you like to eat. Choose low-fat or nonfat alternatives. Choose round or loin cuts of red meat. These types of cuts are lowest in fat and cholesterol. Chicken (without the skin), fish, veal, and ground [place] breast are great choices. Eliminate fatty meats, such as hot dogs and salami. Even shellfish have little or no saturated fat. Have a 3 oz (85 g) portion when you eat lean meat, poultry, or fish. Trans fats are also called "partially hydrogenated oils." They are oils that have been scientifically manipulated so that they are solid at room temperature resulting  in a longer shelf life and improved taste and texture of foods in which they are added. Trans fats are found in stick margarine, some tub margarines, cookies, crackers, and baked goods.  When baking and cooking, oils are a great substitute for butter. The monounsaturated oils are especially beneficial since it is believed they lower LDL and raise HDL. The oils you should avoid entirely are saturated tropical oils, such as coconut and palm.  Remember to eat a lot from food groups that are naturally free of saturated and trans fat, including fish, fruit, vegetables, beans, grains (barley, rice, couscous, bulgur wheat), and pasta (without cream sauces).  IDENTIFYING FOODS THAT LOWER FAT AND CHOLESTEROL  Soluble fiber may lower your cholesterol. This type of fiber is found in fruits such as apples, vegetables such as broccoli, potatoes, and carrots, legumes such as beans, peas, and lentils, and grains such as barley. Foods fortified with plant sterols (phytosterol) may also lower cholesterol. You should eat at least 2 g per day of these foods for a cholesterol lowering effect.  Read package labels to identify low-saturated fats, trans fat free, and low-fat foods at the supermarket. Select cheeses that have only 2 to 3 g saturated fat per ounce. Use a heart-healthy tub margarine that is free of trans fats or partially hydrogenated oil. When buying baked goods (cookies, crackers), avoid partially hydrogenated oils. Breads and muffins should be made from whole grains (whole-wheat or whole oat flour, instead of "flour" or "enriched flour"). Buy non-creamy canned soups with reduced salt and no  added fats.  FOOD PREPARATION TECHNIQUES  Never deep-fry. If you must fry, either stir-fry, which uses very little fat, or use non-stick cooking sprays. When possible, broil, bake, or roast meats, and steam vegetables. Instead of putting butter or margarine on vegetables, use lemon and herbs, applesauce, and cinnamon (for squash  and sweet potatoes). Use nonfat yogurt, salsa, and low-fat dressings for salads.  LOW-SATURATED FAT / LOW-FAT FOOD SUBSTITUTES Meats / Saturated Fat (g)  Avoid: Steak, marbled (3 oz/85 g) / 11 g  Choose: Steak, lean (3 oz/85 g) / 4 g  Avoid: Hamburger (3 oz/85 g) / 7 g  Choose: Hamburger, lean (3 oz/85 g) / 5 g  Avoid: Ham (3 oz/85 g) / 6 g  Choose: Ham, lean cut (3 oz/85 g) / 2.4 g  Avoid: Chicken, with skin, dark meat (3 oz/85 g) / 4 g  Choose: Chicken, skin removed, dark meat (3 oz/85 g) / 2 g  Avoid: Chicken, with skin, light meat (3 oz/85 g) / 2.5 g  Choose: Chicken, skin removed, light meat (3 oz/85 g) / 1 g Dairy / Saturated Fat (g)  Avoid: Whole milk (1 cup) / 5 g  Choose: Low-fat milk, 2% (1 cup) / 3 g  Choose: Low-fat milk, 1% (1 cup) / 1.5 g  Choose: Skim milk (1 cup) / 0.3 g  Avoid: Hard cheese (1 oz/28 g) / 6 g  Choose: Skim milk cheese (1 oz/28 g) / 2 to 3 g  Avoid: Cottage cheese, 4% fat (1 cup) / 6.5 g  Choose: Low-fat cottage cheese, 1% fat (1 cup) / 1.5 g  Avoid: Ice cream (1 cup) / 9 g  Choose: Sherbet (1 cup) / 2.5 g  Choose: Nonfat frozen yogurt (1 cup) / 0.3 g  Choose: Frozen fruit bar / trace  Avoid: Whipped cream (1 tbs) / 3.5 g  Choose: Nondairy whipped topping (1 tbs) / 1 g Condiments / Saturated Fat (g)  Avoid: Mayonnaise (1 tbs) / 2 g  Choose: Low-fat mayonnaise (1 tbs) / 1 g  Avoid: Butter (1 tbs) / 7 g  Choose: Extra light margarine (1 tbs) / 1 g  Avoid: Coconut oil (1 tbs) / 11.8 g  Choose: Olive oil (1 tbs) / 1.8 g  Choose: Corn oil (1 tbs) / 1.7 g  Choose: Safflower oil (1 tbs) / 1.2 g  Choose: Sunflower oil (1 tbs) / 1.4 g  Choose: Soybean oil (1 tbs) / 2.4 g  Choose: Canola oil (1 tbs) / 1 g Document Released: 04/27/2005 Document Revised: 08/22/2012 Document Reviewed: 10/16/2010 [employer] Patient Information 2014 [place], [place].

## 2013-10-07 ENCOUNTER — Telehealth: Payer: Self-pay | Admitting: Family Medicine

## 2013-10-07 NOTE — Telephone Encounter (Signed)
Relevant patient education mailed to patient.  

## 2013-10-08 LAB — URINE CULTURE

## 2013-10-08 NOTE — Assessment & Plan Note (Addendum)
ua today  Pt has a fistula that is being watched  cx sent

## 2013-10-08 NOTE — Assessment & Plan Note (Signed)
Lab Results  Component Value Date   HGBA1C 6.1 09/29/2013    imporoving with wt loss and better habits Enc to keep it up

## 2013-10-08 NOTE — Assessment & Plan Note (Signed)
bp is down significantly with wt loss Will cut losartan in 1/2 and continue to follow bp in fair control at this time  BP Readings from Last 1 Encounters:  10/06/13 98/62   No changes needed Disc lifstyle change with low sodium diet and exercise  - enc to keep it up  Labs reviewed

## 2013-10-08 NOTE — Assessment & Plan Note (Signed)
Disc goals for lipids and reasons to control them This is up a bit and I suspect hereditary in nature -given better diet and wt loss  Rev labs with pt Rev low sat fat diet in detail

## 2013-10-08 NOTE — Assessment & Plan Note (Signed)
Reviewed health habits including diet and exercise and skin cancer prevention Reviewed appropriate screening tests for age  Also reviewed health mt list, fam hx and immunization status , as well as social and family history    Labs rev Wt loss commended

## 2013-10-13 ENCOUNTER — Ambulatory Visit: Payer: BC Managed Care – PPO | Admitting: Internal Medicine

## 2013-10-17 ENCOUNTER — Other Ambulatory Visit: Payer: Self-pay

## 2013-10-17 MED ORDER — OMEPRAZOLE 40 MG PO CPDR: 40.0000 mg | DELAYED_RELEASE_CAPSULE | Freq: Every day | ORAL | Status: DC

## 2013-10-17 NOTE — Telephone Encounter (Signed)
Pt left vm [facility];advised via v/m done.

## 2013-11-17 ENCOUNTER — Ambulatory Visit: Payer: No Typology Code available for payment source | Admitting: Internal Medicine

## 2013-12-22 ENCOUNTER — Ambulatory Visit: Payer: No Typology Code available for payment source | Admitting: Internal Medicine

## 2014-01-29 ENCOUNTER — Encounter: Payer: Self-pay | Admitting: Family Medicine

## 2014-03-24 ENCOUNTER — Other Ambulatory Visit: Payer: Self-pay | Admitting: Family Medicine

## 2014-04-03 ENCOUNTER — Telehealth: Payer: Self-pay | Admitting: Family Medicine

## 2014-04-03 DIAGNOSIS — E785 Hyperlipidemia, unspecified: Secondary | ICD-10-CM

## 2014-04-03 DIAGNOSIS — R739 Hyperglycemia, unspecified: Secondary | ICD-10-CM

## 2014-04-03 DIAGNOSIS — I10 Essential (primary) hypertension: Secondary | ICD-10-CM

## 2014-04-03 NOTE — Telephone Encounter (Signed)
-----   Message from [place] sent at 03/29/2014  3:45 PM EST ----- Regarding: f/u labs Wed 11/25, need orders Please order  future f/u labs for pt's upcoming lab appt. Thanks Aniceto Boss

## 2014-04-06 ENCOUNTER — Other Ambulatory Visit (INDEPENDENT_AMBULATORY_CARE_PROVIDER_SITE_OTHER): Payer: No Typology Code available for payment source

## 2014-04-06 DIAGNOSIS — R739 Hyperglycemia, unspecified: Secondary | ICD-10-CM

## 2014-04-06 DIAGNOSIS — E785 Hyperlipidemia, unspecified: Secondary | ICD-10-CM

## 2014-04-06 DIAGNOSIS — I10 Essential (primary) hypertension: Secondary | ICD-10-CM

## 2014-04-06 LAB — LIPID PANEL
CHOLESTEROL: 180 mg/dL (ref 0–200)
HDL: 40.6 mg/dL (ref >39.00)
LDL CALC: 131 mg/dL — AB (ref 0–99)
NonHDL: 139.4
Total CHOL/HDL Ratio: 4
Triglycerides: 44 mg/dL (ref 0.0–149.0)
VLDL: 8.8 mg/dL (ref 0.0–40.0)

## 2014-04-06 LAB — COMPREHENSIVE METABOLIC PANEL
ALK PHOS: 44 U/L (ref 39–117)
ALT: 20 U/L (ref 0–35)
AST: 19 U/L (ref 0–37)
Albumin: 3.6 g/dL (ref 3.5–5.2)
BUN: 25 mg/dL — ABNORMAL HIGH (ref 6–23)
CO2: 28 mEq/L (ref 19–32)
CREATININE: 0.6 mg/dL (ref 0.4–1.2)
Calcium: 8.6 mg/dL (ref 8.4–10.5)
Chloride: 107 mEq/L (ref 96–112)
GFR: 106.6 mL/min (ref >60.00)
Glucose, Bld: 108 mg/dL — ABNORMAL HIGH (ref 70–99)
POTASSIUM: 4 meq/L (ref 3.5–5.1)
Sodium: 140 mEq/L (ref 135–145)
Total Bilirubin: 0.3 mg/dL (ref 0.2–1.2)
Total Protein: 6.7 g/dL (ref 6.0–8.3)

## 2014-04-06 LAB — HEMOGLOBIN A1C: Hgb A1c MFr Bld: 6.1 % (ref 4.6–6.5)

## 2014-04-13 ENCOUNTER — Ambulatory Visit: Payer: No Typology Code available for payment source | Admitting: Family Medicine

## 2014-04-20 ENCOUNTER — Telehealth: Payer: Self-pay

## 2014-04-20 MED ORDER — BUDESONIDE-FORMOTEROL FUMARATE 80-4.5 MCG/ACT IN AERO: 2.0000 | INHALATION_SPRAY | Freq: Two times a day (BID) | RESPIRATORY_TRACT | Status: DC

## 2014-04-20 MED ORDER — LOSARTAN POTASSIUM 50 MG PO TABS: ORAL_TABLET | ORAL | Status: DC

## 2014-04-20 NOTE — Telephone Encounter (Signed)
I [facility] Px for cpap titration is in IN box

## 2014-04-20 NOTE — Telephone Encounter (Signed)
Pt [facility]; pt cannot find 09/2013 rx given at office visit. Advised done. Pt also said Advair is not covered by ins and pt request to change Advair to a covered med; Symbicort, Spiriva and Dulera are covered. [facility]. Pt also request order faxed to [facility] to state needs auto titration of cpap x 2 weeks 5 - 15 cm H2O. Pt request call to Ivin Booty at [place] [phone] for fax #. I called Ivin Booty and left v/m requesting cb with fax #. When sending order to [facility] please put to atten: sharon.

## 2014-04-23 NOTE — Telephone Encounter (Signed)
Left voicemail on Patty Williams's phone requesting fax # to send order to

## 2014-04-24 NOTE — Telephone Encounter (Signed)
Patty Williams called back and gave me fax # to fax orders to. Order faxed

## 2014-06-22 ENCOUNTER — Other Ambulatory Visit: Payer: Self-pay | Admitting: Family Medicine

## 2014-09-19 ENCOUNTER — Encounter: Payer: Self-pay | Admitting: Internal Medicine

## 2014-10-05 ENCOUNTER — Telehealth: Payer: Self-pay | Admitting: Family Medicine

## 2014-10-05 ENCOUNTER — Other Ambulatory Visit (INDEPENDENT_AMBULATORY_CARE_PROVIDER_SITE_OTHER): Payer: No Typology Code available for payment source

## 2014-10-05 DIAGNOSIS — Z0000 Encounter for general adult medical examination without abnormal findings: Secondary | ICD-10-CM

## 2014-10-05 DIAGNOSIS — R739 Hyperglycemia, unspecified: Secondary | ICD-10-CM | POA: Diagnosis not present

## 2014-10-05 LAB — CBC WITH DIFFERENTIAL/PLATELET
BASOS ABS: 0 10*3/uL (ref 0.0–0.1)
Basophils Relative: 0.5 % (ref 0.0–3.0)
EOS PCT: 1.4 % (ref 0.0–5.0)
Eosinophils Absolute: 0.1 10*3/uL (ref 0.0–0.7)
HCT: 37.3 % (ref 36.0–46.0)
HEMOGLOBIN: 12.4 g/dL (ref 12.0–15.0)
Lymphocytes Relative: 22.5 % (ref 12.0–46.0)
Lymphs Abs: 2 10*3/uL (ref 0.7–4.0)
MCHC: 33.3 g/dL (ref 30.0–36.0)
MCV: 91.4 fl (ref 78.0–100.0)
MONO ABS: 0.9 10*3/uL (ref 0.1–1.0)
MONOS PCT: 9.8 % (ref 3.0–12.0)
NEUTROS PCT: 65.8 % (ref 43.0–77.0)
Neutro Abs: 5.9 10*3/uL (ref 1.4–7.7)
Platelets: 276 10*3/uL (ref 150.0–400.0)
RBC: 4.08 Mil/uL (ref 3.87–5.11)
RDW: 14.1 % (ref 11.5–15.5)
WBC: 8.9 10*3/uL (ref 4.0–10.5)

## 2014-10-05 LAB — COMPREHENSIVE METABOLIC PANEL
ALBUMIN: 3.6 g/dL (ref 3.5–5.2)
ALT: 27 U/L (ref 0–35)
AST: 15 U/L (ref 0–37)
Alkaline Phosphatase: 54 U/L (ref 39–117)
BILIRUBIN TOTAL: 0.5 mg/dL (ref 0.2–1.2)
BUN: 19 mg/dL (ref 6–23)
CO2: 31 mEq/L (ref 19–32)
Calcium: 8.6 mg/dL (ref 8.4–10.5)
Chloride: 104 mEq/L (ref 96–112)
Creatinine, Ser: 0.54 mg/dL (ref 0.40–1.20)
GFR: 124.79 mL/min (ref >60.00)
Glucose, Bld: 103 mg/dL — ABNORMAL HIGH (ref 70–99)
POTASSIUM: 3.9 meq/L (ref 3.5–5.1)
SODIUM: 138 meq/L (ref 135–145)
Total Protein: 6.4 g/dL (ref 6.0–8.3)

## 2014-10-05 LAB — TSH: TSH: 2.13 u[IU]/mL (ref 0.35–4.50)

## 2014-10-05 LAB — LIPID PANEL
Cholesterol: 186 mg/dL (ref 0–200)
HDL: 54.8 mg/dL (ref >39.00)
LDL Cholesterol: 117 mg/dL — ABNORMAL HIGH (ref 0–99)
NonHDL: 131.2
Total CHOL/HDL Ratio: 3
Triglycerides: 69 mg/dL (ref 0.0–149.0)
VLDL: 13.8 mg/dL (ref 0.0–40.0)

## 2014-10-05 LAB — HEMOGLOBIN A1C: Hgb A1c MFr Bld: 5.6 % (ref 4.6–6.5)

## 2014-10-05 NOTE — Telephone Encounter (Signed)
-----   Message from Ignacia Marvel, [place] sent at 10/02/2014 11:56 AM EDT ----- Please order labs for upcoming lab appt on 10/05/14. Thanks!

## 2014-10-12 ENCOUNTER — Ambulatory Visit (INDEPENDENT_AMBULATORY_CARE_PROVIDER_SITE_OTHER): Payer: No Typology Code available for payment source | Admitting: Family Medicine

## 2014-10-12 ENCOUNTER — Encounter: Payer: Self-pay | Admitting: Family Medicine

## 2014-10-12 VITALS — BP 108/64 | HR 64 | Temp 99.0°F | Ht 65.0 in | Wt 186.5 lb

## 2014-10-12 DIAGNOSIS — R739 Hyperglycemia, unspecified: Secondary | ICD-10-CM | POA: Diagnosis not present

## 2014-10-12 DIAGNOSIS — Z0000 Encounter for general adult medical examination without abnormal findings: Secondary | ICD-10-CM | POA: Diagnosis not present

## 2014-10-12 DIAGNOSIS — I10 Essential (primary) hypertension: Secondary | ICD-10-CM

## 2014-10-12 NOTE — Patient Instructions (Signed)
Try a dose of ranitidine 150 mg at bedtime to control reflux  Eat light at night Work on weight loss  Take care of yourself
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
## 2014-10-12 NOTE — Progress Notes (Signed)
Pre visit review using our clinic review tool, if applicable. No additional management support is needed unless otherwise documented below in the visit note. 

## 2014-10-15 NOTE — Assessment & Plan Note (Signed)
Reviewed health habits including diet and exercise and skin cancer prevention Reviewed appropriate screening tests for age  Also reviewed health mt list, fam hx and immunization status , as well as social and family history   See HPI  Lab reviewed  Enc healthy habits  Declines hep C and HIV screening

## 2014-10-15 NOTE — Assessment & Plan Note (Signed)
Lab Results  Component Value Date   HGBA1C 5.6 10/05/2014   This is well controlled with healthy diet and exercise  Enc wt loss

## 2014-10-15 NOTE — Assessment & Plan Note (Signed)
bp in fair control at this time  BP Readings from Last 1 Encounters:  10/12/14 108/64   No changes needed Disc lifstyle change with low sodium diet and exercise  Labs reviewed Doing well  Enc exercise

## 2014-11-08 ENCOUNTER — Other Ambulatory Visit: Payer: Self-pay | Admitting: Family Medicine

## 2014-12-18 ENCOUNTER — Other Ambulatory Visit: Payer: Self-pay

## 2014-12-18 MED ORDER — LOSARTAN POTASSIUM 50 MG PO TABS: ORAL_TABLET | ORAL | Status: DC

## 2014-12-18 NOTE — Telephone Encounter (Signed)
[facility]; advised pt done.

## 2015-01-15 NOTE — Telephone Encounter (Signed)
Pt left v/m requesting [facility]; advised sent electronically on 12/18/2014. Pt will ck with pharmacy.

## 2015-02-04 ENCOUNTER — Encounter: Payer: Self-pay | Admitting: Family Medicine

## 2015-04-11 ENCOUNTER — Telehealth: Payer: Self-pay | Admitting: Family Medicine

## 2015-04-11 DIAGNOSIS — R739 Hyperglycemia, unspecified: Secondary | ICD-10-CM

## 2015-04-11 DIAGNOSIS — E785 Hyperlipidemia, unspecified: Secondary | ICD-10-CM

## 2015-04-11 DIAGNOSIS — I10 Essential (primary) hypertension: Secondary | ICD-10-CM

## 2015-04-11 NOTE — Telephone Encounter (Signed)
-----   Message from Marchia Bond sent at 04/09/2015  2:45 PM EST ----- Regarding: 6 mo f/u labs Fri 12/2, need orders, Thanks! :-) Please order future 6 mo f/u labs for pt's upcoming lab appt. Thanks Aniceto Boss

## 2015-04-12 ENCOUNTER — Ambulatory Visit (INDEPENDENT_AMBULATORY_CARE_PROVIDER_SITE_OTHER): Payer: No Typology Code available for payment source | Admitting: Internal Medicine

## 2015-04-12 ENCOUNTER — Other Ambulatory Visit (INDEPENDENT_AMBULATORY_CARE_PROVIDER_SITE_OTHER): Payer: No Typology Code available for payment source

## 2015-04-12 ENCOUNTER — Encounter: Payer: Self-pay | Admitting: Internal Medicine

## 2015-04-12 VITALS — BP 104/66 | HR 72 | Temp 98.3°F | Wt 191.0 lb

## 2015-04-12 DIAGNOSIS — E785 Hyperlipidemia, unspecified: Secondary | ICD-10-CM | POA: Diagnosis not present

## 2015-04-12 DIAGNOSIS — R109 Unspecified abdominal pain: Secondary | ICD-10-CM | POA: Diagnosis not present

## 2015-04-12 DIAGNOSIS — I10 Essential (primary) hypertension: Secondary | ICD-10-CM

## 2015-04-12 DIAGNOSIS — R739 Hyperglycemia, unspecified: Secondary | ICD-10-CM

## 2015-04-12 DIAGNOSIS — N939 Abnormal uterine and vaginal bleeding, unspecified: Secondary | ICD-10-CM

## 2015-04-12 LAB — COMPREHENSIVE METABOLIC PANEL
ALBUMIN: 3.6 g/dL (ref 3.5–5.2)
ALT: 14 U/L (ref 0–35)
AST: 10 U/L (ref 0–37)
Alkaline Phosphatase: 63 U/L (ref 39–117)
BUN: 17 mg/dL (ref 6–23)
CHLORIDE: 104 meq/L (ref 96–112)
CO2: 28 meq/L (ref 19–32)
Calcium: 9 mg/dL (ref 8.4–10.5)
Creatinine, Ser: 0.53 mg/dL (ref 0.40–1.20)
GFR: 127.27 mL/min (ref >60.00)
Glucose, Bld: 109 mg/dL — ABNORMAL HIGH (ref 70–99)
POTASSIUM: 3.9 meq/L (ref 3.5–5.1)
SODIUM: 140 meq/L (ref 135–145)
Total Bilirubin: 0.5 mg/dL (ref 0.2–1.2)
Total Protein: 7.4 g/dL (ref 6.0–8.3)

## 2015-04-12 LAB — CBC WITH DIFFERENTIAL/PLATELET
BASOS PCT: 0.5 % (ref 0.0–3.0)
Basophils Absolute: 0.1 10*3/uL (ref 0.0–0.1)
EOS PCT: 0.8 % (ref 0.0–5.0)
Eosinophils Absolute: 0.1 10*3/uL (ref 0.0–0.7)
HCT: 36.1 % (ref 36.0–46.0)
Hemoglobin: 11.8 g/dL — ABNORMAL LOW (ref 12.0–15.0)
LYMPHS ABS: 1.5 10*3/uL (ref 0.7–4.0)
Lymphocytes Relative: 14.8 % (ref 12.0–46.0)
MCHC: 32.8 g/dL (ref 30.0–36.0)
MCV: 92.8 fl (ref 78.0–100.0)
MONO ABS: 0.9 10*3/uL (ref 0.1–1.0)
Monocytes Relative: 8.4 % (ref 3.0–12.0)
NEUTROS PCT: 75.5 % (ref 43.0–77.0)
Neutro Abs: 7.8 10*3/uL — ABNORMAL HIGH (ref 1.4–7.7)
Platelets: 311 10*3/uL (ref 150.0–400.0)
RBC: 3.89 Mil/uL (ref 3.87–5.11)
RDW: 14.5 % (ref 11.5–15.5)
WBC: 10.3 10*3/uL (ref 4.0–10.5)

## 2015-04-12 LAB — POCT URINALYSIS DIPSTICK
Bilirubin, UA: NEGATIVE
Glucose, UA: NEGATIVE
KETONES UA: NEGATIVE
Nitrite, UA: NEGATIVE
PH UA: 6
Spec Grav, UA: 1.02
Urobilinogen, UA: NEGATIVE

## 2015-04-12 LAB — LIPID PANEL
CHOLESTEROL: 168 mg/dL (ref 0–200)
HDL: 45.2 mg/dL (ref >39.00)
LDL CALC: 104 mg/dL — AB (ref 0–99)
NONHDL: 122.31
Total CHOL/HDL Ratio: 4
Triglycerides: 93 mg/dL (ref 0.0–149.0)
VLDL: 18.6 mg/dL (ref 0.0–40.0)

## 2015-04-12 LAB — HEMOGLOBIN A1C: HEMOGLOBIN A1C: 5.6 % (ref 4.6–6.5)

## 2015-04-12 NOTE — Progress Notes (Signed)
Pre visit review using our clinic review tool, if applicable. No additional management support is needed unless otherwise documented below in the visit note. 

## 2015-04-13 LAB — URINE CULTURE

## 2015-04-14 ENCOUNTER — Encounter: Payer: Self-pay | Admitting: Internal Medicine

## 2015-04-14 NOTE — Progress Notes (Signed)
Subjective:    Patient ID: Patty Williams, female    DOB: 1961-04-06, 54 y.o.   MRN: [id number]  HPI  Pt presents to the clinic today with c/o lower abdominal pain. This started 5 days ago. She describes the pain as pressure. It does seem worse with movement. She denies urgency, frequency or dysuria. She is having normal BM's and denies blood in her stool. She does report abnormal vaginal bleeding. She started her period 3 weeks ago, and it last for 10 days. She then started bleeding again 3 days ago. She is on OCP. She did see her GYN 1 month ago. She does have endometriosis. FSH and LH was normal at that time.  Review of Systems      Past Medical History  Diagnosis Date  . Obesity   . Stress reaction   . Elevated blood pressure reading without diagnosis of hypertension   . Endometriosis   . GERD (gastroesophageal reflux disease)   . ADD (attention deficit disorder) 12/2006    adult  . Asthma   . HLD (hyperlipidemia)   . Diabetes mellitus type II     mild  . OSA on CPAP   . Plantar fasciitis     with orthotics-much difficulty adjusting these    Current Outpatient Prescriptions  Medication Sig Dispense Refill  . albuterol (PROVENTIL HFA) 108 (90 BASE) MCG/ACT inhaler Inhale 2 puffs into the lungs every 4 (four) hours as needed. 3 Inhaler 1  . budesonide-formoterol (SYMBICORT) 80-4.5 MCG/ACT inhaler Inhale 2 puffs into the lungs 2 (two) times daily. 3 Inhaler 3  . hydrocortisone (ANUSOL-HC) 25 MG suppository Place 1 suppository (25 mg total) rectally 2 (two) times daily. As needed for hemorrhoid 12 suppository 1  . losartan (COZAAR) 50 MG tablet TAKE 1/2  BY MOUTH DAILY 45 tablet 1  . Multiple Vitamin (MULTIVITAMIN) tablet Take 1 tablet by mouth daily.      . Norethindrone-Ethinyl Estradiol-Fe Biphas (LO LOESTRIN FE) 1 MG-10 MCG / 10 MCG tablet Take 1 tablet by mouth daily. 3 Package 1  . omeprazole (PRILOSEC) 40 MG capsule TAKE 1 CAPSULE DAILY 90 capsule 2   No current  facility-administered medications for this visit.    Allergies  Allergen Reactions  . Morphine And Related Other (See Comments)    Hallucinations    Family History  Problem Relation Age of Onset  . Cancer Father     prostate  . Thyroid disease Mother     ?  . Diabetes Mother   . Hypertension Mother   . Heart attack      grandfather (in his 82's)  . Parkinsonism      grandfater    Social History   Social History  . Marital Status: Single    Spouse Name: N/A  . Number of Children: N/A  . Years of Education: N/A   Occupational History  . Dentist    Social History Main Topics  . Smoking status: Never Smoker   . Smokeless tobacco: Never Used  . Alcohol Use: 0.0 oz/week    0 Standard drinks or equivalent per week     Comment: social  . Drug Use: No  . Sexual Activity: Not on file   Other Topics Concern  . Not on file   Social History Narrative   Permanent life partner-female     Constitutional: Denies fever, malaise, fatigue, headache or abrupt weight changes.  Respiratory: Denies difficulty breathing, shortness of breath, cough or sputum production.  Cardiovascular: Denies chest pain, chest tightness, palpitations or swelling in the hands or feet.  Gastrointestinal: Pt reports abdominal pain. Denies bloating, constipation, diarrhea or blood in the stool.  GU: Pt reports vaginal bleeding. Denies urgency, frequency, pain with urination, burning sensation, blood in urine, odor or discharge.  No other specific complaints in a complete review of systems (except as listed in HPI above).  Objective:   Physical Exam  BP 104/66 mmHg  Pulse 72  Temp(Src) 98.3 F (36.8 C) (Oral)  Wt 191 lb (86.637 kg)  SpO2 97% Wt Readings from Last 3 Encounters:  04/12/15 191 lb (86.637 kg)  10/12/14 186 lb 8 oz (84.596 kg)  10/06/13 173 lb (78.472 kg)    General: Appears her stated age, well developed, well nourished in NAD. Cardiovascular: Normal rate and rhythm. S1,S2  noted.   Pulmonary/Chest: Normal effort and positive vesicular breath sounds. No respiratory distress. No wheezes, rales or ronchi noted.  Abdomen: Soft and tender  Over the bladder.. Normal bowel sounds. No distention or masses noted. No CVA tenderness. Neurological: Alert and oriented.   BMET    Component Value Date/Time   NA 140 04/12/2015 0930   K 3.9 04/12/2015 0930   CL 104 04/12/2015 0930   CO2 28 04/12/2015 0930   GLUCOSE 109* 04/12/2015 0930   BUN 17 04/12/2015 0930   CREATININE 0.53 04/12/2015 0930   CALCIUM 9.0 04/12/2015 0930   GFRNONAA 89.72 02/21/2010 0823   GFRAA 115 03/02/2008 1018    Lipid Panel     Component Value Date/Time   CHOL 168 04/12/2015 0930   TRIG 93.0 04/12/2015 0930   HDL 45.20 04/12/2015 0930   CHOLHDL 4 04/12/2015 0930   VLDL 18.6 04/12/2015 0930   LDLCALC 104* 04/12/2015 0930    CBC    Component Value Date/Time   WBC 10.3 04/12/2015 1656   RBC 3.89 04/12/2015 1656   HGB 11.8* 04/12/2015 1656   HCT 36.1 04/12/2015 1656   PLT 311.0 04/12/2015 1656   MCV 92.8 04/12/2015 1656   MCHC 32.8 04/12/2015 1656   RDW 14.5 04/12/2015 1656   LYMPHSABS 1.5 04/12/2015 1656   MONOABS 0.9 04/12/2015 1656   EOSABS 0.1 04/12/2015 1656   BASOSABS 0.1 04/12/2015 1656    Hgb A1C Lab Results  Component Value Date   HGBA1C 5.6 04/12/2015         Assessment & Plan:   Abdominal pain and vaginal bleeding:  Urinalysis: 3+ blood Will send urine culture ? Fibroids She declines pelvic exam today but she will make an appt to discuss with her GYN  RTC as needed or if symptoms persist or worsen

## 2015-04-14 NOTE — Patient Instructions (Signed)

## 2015-04-15 NOTE — Addendum Note (Signed)
Addended by: Lurlean Nanny on: 04/15/2015 04:11 PM   Modules accepted: Orders

## 2015-04-19 ENCOUNTER — Ambulatory Visit (INDEPENDENT_AMBULATORY_CARE_PROVIDER_SITE_OTHER): Payer: No Typology Code available for payment source | Admitting: Family Medicine

## 2015-04-19 ENCOUNTER — Encounter: Payer: Self-pay | Admitting: Family Medicine

## 2015-04-19 VITALS — BP 110/70 | HR 71 | Temp 98.9°F | Ht 65.0 in | Wt 194.0 lb

## 2015-04-19 DIAGNOSIS — E785 Hyperlipidemia, unspecified: Secondary | ICD-10-CM

## 2015-04-19 DIAGNOSIS — R739 Hyperglycemia, unspecified: Secondary | ICD-10-CM

## 2015-04-19 DIAGNOSIS — I10 Essential (primary) hypertension: Secondary | ICD-10-CM | POA: Diagnosis not present

## 2015-04-19 DIAGNOSIS — M25562 Pain in left knee: Secondary | ICD-10-CM

## 2015-04-19 NOTE — Assessment & Plan Note (Signed)
Disc goals for lipids and reasons to control them Rev labs with pt Rev low sat fat diet in detail HDL is down from less exercise/ LDL is also down  Continue to follow

## 2015-04-19 NOTE — Assessment & Plan Note (Signed)
bp in fair control at this time  BP Readings from Last 1 Encounters:  04/19/15 110/70   No changes needed Disc lifstyle change with low sodium diet and exercise  Labs reviewed

## 2015-04-19 NOTE — Progress Notes (Signed)
Pre visit review using our clinic review tool, if applicable. No additional management support is needed unless otherwise documented below in the visit note. 

## 2015-04-19 NOTE — Assessment & Plan Note (Signed)
Lab Results  Component Value Date   HGBA1C 5.6 04/12/2015   This is stable  Pt continues to work with a coach for wt loss/ diet and exercise (was set back by a knee injury) Rev low glycemic diet

## 2015-04-19 NOTE — Patient Instructions (Signed)
Try to get a break from work when you can  Keep working on weight loss and exercise with your coach  Cholesterol is fairly stable - exercise will increase HDL Glucose is well controlled   Follow up with your ortho - ? Consider MRI of the knee?   Take care of yourself   Follow up in 6 months for annual exam with labs prior
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
## 2015-05-02 ENCOUNTER — Other Ambulatory Visit: Payer: Self-pay | Admitting: Family Medicine

## 2015-06-25 ENCOUNTER — Other Ambulatory Visit: Payer: Self-pay | Admitting: Family Medicine

## 2015-07-17 ENCOUNTER — Other Ambulatory Visit: Payer: Self-pay | Admitting: Family Medicine

## 2015-08-16 ENCOUNTER — Other Ambulatory Visit: Payer: Self-pay

## 2015-08-16 MED ORDER — OMEPRAZOLE 40 MG PO CPDR: 40.0000 mg | DELAYED_RELEASE_CAPSULE | Freq: Every day | ORAL | Status: DC

## 2015-08-16 NOTE — Telephone Encounter (Signed)
Pt left v/m requesting omeprazole sent to new pharmacy, [address]; refill done per protocol. Left v/m per request refill done.

## 2015-09-20 ENCOUNTER — Other Ambulatory Visit: Payer: Self-pay

## 2015-09-20 MED ORDER — LOSARTAN POTASSIUM 50 MG PO TABS: 25.0000 mg | ORAL_TABLET | Freq: Every day | ORAL | Status: DC

## 2015-09-20 NOTE — Telephone Encounter (Signed)
Pt left v/m requesting refill losartan to [facility]; last seen 04/19/15. Pt has changed pharmacy to [facility] ct.pt notified done.

## 2015-10-07 ENCOUNTER — Telehealth: Payer: Self-pay | Admitting: Family Medicine

## 2015-10-07 DIAGNOSIS — Z0000 Encounter for general adult medical examination without abnormal findings: Secondary | ICD-10-CM

## 2015-10-07 DIAGNOSIS — R739 Hyperglycemia, unspecified: Secondary | ICD-10-CM

## 2015-10-07 NOTE — Telephone Encounter (Signed)
-----   Message from Ellamae Sia sent at 10/03/2015 10:59 AM EDT ----- Regarding: Lab orders for Friday, 6.2.17 Patient is scheduled for CPX labs, please order future labs, Thanks , Karna Christmas

## 2015-10-11 ENCOUNTER — Other Ambulatory Visit (INDEPENDENT_AMBULATORY_CARE_PROVIDER_SITE_OTHER): Payer: 59

## 2015-10-11 DIAGNOSIS — R739 Hyperglycemia, unspecified: Secondary | ICD-10-CM

## 2015-10-11 DIAGNOSIS — Z0000 Encounter for general adult medical examination without abnormal findings: Secondary | ICD-10-CM

## 2015-10-11 LAB — COMPREHENSIVE METABOLIC PANEL
ALBUMIN: 3.8 g/dL (ref 3.5–5.2)
ALK PHOS: 45 U/L (ref 39–117)
ALT: 16 U/L (ref 0–35)
AST: 15 U/L (ref 0–37)
BUN: 21 mg/dL (ref 6–23)
CO2: 27 mEq/L (ref 19–32)
CREATININE: 0.58 mg/dL (ref 0.40–1.20)
Calcium: 8.8 mg/dL (ref 8.4–10.5)
Chloride: 107 mEq/L (ref 96–112)
GFR: 114.48 mL/min (ref >60.00)
GLUCOSE: 106 mg/dL — AB (ref 70–99)
POTASSIUM: 3.9 meq/L (ref 3.5–5.1)
SODIUM: 142 meq/L (ref 135–145)
TOTAL PROTEIN: 6.7 g/dL (ref 6.0–8.3)
Total Bilirubin: 0.3 mg/dL (ref 0.2–1.2)

## 2015-10-11 LAB — LIPID PANEL
CHOL/HDL RATIO: 4
Cholesterol: 167 mg/dL (ref 0–200)
HDL: 44.8 mg/dL (ref >39.00)
LDL Cholesterol: 110 mg/dL — ABNORMAL HIGH (ref 0–99)
NONHDL: 122.31
Triglycerides: 64 mg/dL (ref 0.0–149.0)
VLDL: 12.8 mg/dL (ref 0.0–40.0)

## 2015-10-11 LAB — CBC WITH DIFFERENTIAL/PLATELET
BASOS ABS: 0 10*3/uL (ref 0.0–0.1)
BASOS PCT: 0.6 % (ref 0.0–3.0)
EOS ABS: 0.1 10*3/uL (ref 0.0–0.7)
Eosinophils Relative: 1.7 % (ref 0.0–5.0)
HCT: 37.8 % (ref 36.0–46.0)
Hemoglobin: 12.4 g/dL (ref 12.0–15.0)
LYMPHS ABS: 2 10*3/uL (ref 0.7–4.0)
Lymphocytes Relative: 35.1 % (ref 12.0–46.0)
MCHC: 32.8 g/dL (ref 30.0–36.0)
MCV: 90.1 fl (ref 78.0–100.0)
Monocytes Absolute: 0.4 10*3/uL (ref 0.1–1.0)
Monocytes Relative: 7.7 % (ref 3.0–12.0)
NEUTROS ABS: 3.2 10*3/uL (ref 1.4–7.7)
NEUTROS PCT: 54.9 % (ref 43.0–77.0)
PLATELETS: 273 10*3/uL (ref 150.0–400.0)
RBC: 4.2 Mil/uL (ref 3.87–5.11)
RDW: 15 % (ref 11.5–15.5)
WBC: 5.8 10*3/uL (ref 4.0–10.5)

## 2015-10-11 LAB — TSH: TSH: 1.19 u[IU]/mL (ref 0.35–4.50)

## 2015-10-11 LAB — HEMOGLOBIN A1C: Hgb A1c MFr Bld: 5.8 % (ref 4.6–6.5)

## 2015-10-18 ENCOUNTER — Ambulatory Visit (INDEPENDENT_AMBULATORY_CARE_PROVIDER_SITE_OTHER): Payer: 59 | Admitting: Family Medicine

## 2015-10-18 ENCOUNTER — Encounter: Payer: Self-pay | Admitting: Family Medicine

## 2015-10-18 VITALS — BP 106/68 | HR 64 | Temp 98.3°F | Ht 65.0 in | Wt 194.2 lb

## 2015-10-18 DIAGNOSIS — K219 Gastro-esophageal reflux disease without esophagitis: Secondary | ICD-10-CM

## 2015-10-18 DIAGNOSIS — E785 Hyperlipidemia, unspecified: Secondary | ICD-10-CM

## 2015-10-18 DIAGNOSIS — E669 Obesity, unspecified: Secondary | ICD-10-CM

## 2015-10-18 DIAGNOSIS — I10 Essential (primary) hypertension: Secondary | ICD-10-CM

## 2015-10-18 DIAGNOSIS — R739 Hyperglycemia, unspecified: Secondary | ICD-10-CM

## 2015-10-18 DIAGNOSIS — Z79899 Other long term (current) drug therapy: Secondary | ICD-10-CM

## 2015-10-18 DIAGNOSIS — Z0000 Encounter for general adult medical examination without abnormal findings: Secondary | ICD-10-CM

## 2015-10-18 DIAGNOSIS — Z8781 Personal history of (healed) traumatic fracture: Secondary | ICD-10-CM

## 2015-10-18 NOTE — Progress Notes (Signed)
Subjective:    Patient ID: Patty Williams, female    DOB: 1960-10-05, 55 y.o.   MRN: [id number]  HPI  Here for health maintenance exam and to review chronic medical problems    Wt is stable  Working on weight loss - is a struggle in her 96s  Goal is 20 lb loss  Is getting back into a program  Trying to move more - having knee problems  bmi is 32  Takes omeprazole 40 for gerd  She tried going off of it and she stopped coffee and symptoms came back  She started back  She does get asthma symptoms when she gets reflux   Interested in protecting her bones Takes mvi Takes fish oil Eats a lot of dairy fortified with D   No falls  Has broken wrist and knee in the past  Would like a baseline dexa   Needs px for massage therapist -has done in the past   Knee problems -has had steroid inj- no difference  Has not had synvisc yet ? If osteoarthritis , L knee (the one she broke)  Is unable to go up stairs No MRI  Saw ortho- she did exercises from them   Flu shot 9/16  Mammogram 9/16-negative- already has it scheduled for next fall  Self breast exam -no breast lumps   Pap 10/16 at gyn  Then had 3 wk of bleeding the next month  Not quite post menopausal (perimenopausal)- is following fsh and still on hrt   colonoscopy 9/10-normal with 10 year recall   Td 12/12  bp is stable today  No cp or palpitations or headaches or edema  No side effects to medicines  BP Readings from Last 3 Encounters:  10/18/15 106/68  04/19/15 110/70  04/12/15 104/66      Hyperlipidemia Lab Results  Component Value Date   CHOL 167 10/11/2015   CHOL 168 04/12/2015   CHOL 186 10/05/2014   Lab Results  Component Value Date   HDL 44.80 10/11/2015   HDL 45.20 04/12/2015   HDL 54.80 10/05/2014   Lab Results  Component Value Date   LDLCALC 110* 10/11/2015   LDLCALC 104* 04/12/2015   LDLCALC 117* 10/05/2014   Lab Results  Component Value Date   TRIG 64.0 10/11/2015   TRIG 93.0  04/12/2015   TRIG 69.0 10/05/2014   Lab Results  Component Value Date   CHOLHDL 4 10/11/2015   CHOLHDL 4 04/12/2015   CHOLHDL 3 10/05/2014   Lab Results  Component Value Date   LDLDIRECT 144.2 09/21/2008  very stable overall  Eating fairly health    Hyperglycemia Lab Results  Component Value Date   HGBA1C 5.8 10/11/2015   Was 5.6 last time  Doing well with diet     Chemistry      Component Value Date/Time   NA 142 10/11/2015 0850   K 3.9 10/11/2015 0850   CL 107 10/11/2015 0850   CO2 27 10/11/2015 0850   BUN 21 10/11/2015 0850   CREATININE 0.58 10/11/2015 0850      Component Value Date/Time   CALCIUM 8.8 10/11/2015 0850   ALKPHOS 45 10/11/2015 0850   AST 15 10/11/2015 0850   ALT 16 10/11/2015 0850   BILITOT 0.3 10/11/2015 0850      Lab Results  Component Value Date   TSH 1.19 10/11/2015    Lab Results  Component Value Date   WBC 5.8 10/11/2015   HGB 12.4 10/11/2015   HCT  37.8 10/11/2015   MCV 90.1 10/11/2015   PLT 273.0 10/11/2015    Patient Active Problem List   Diagnosis Date Noted  . Obesity 10/20/2015  . Current use of proton pump inhibitor 10/18/2015  . History of fracture 10/18/2015  . Left knee pain 04/19/2015  . Frequent UTI 10/06/2013  . Seasonal and perennial allergic rhinitis 01/01/2013  . Recurrent UTI 08/12/2011  . Routine general medical examination at a health care facility 04/30/2011  . Hyperglycemia   . Plantar fasciitis   . METATARSALGIA 04/11/2010  . 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 Obstructive sleep apnea 06/10/2009  . GERD 11/09/2008  . Hyperlipidemia 07/20/2008  . Essential hypertension, benign 03/02/2008  . ASTHMA 03/29/2007  . ENDOMETRIOSIS 03/29/2007  . ADD (attention deficit disorder) 12/10/2006   Past Medical History  Diagnosis Date  . Obesity   . Stress reaction   . Elevated blood pressure reading without diagnosis of hypertension   . Endometriosis   . GERD (gastroesophageal reflux disease)   .  ADD (attention deficit disorder) 12/2006    adult  . Asthma   . HLD (hyperlipidemia)   . Diabetes mellitus type II     mild  . OSA on CPAP   . Plantar fasciitis     with orthotics-much difficulty adjusting these   Past Surgical History  Procedure Laterality Date  . Hemorrhoid surgery    . Uterine suspension    . Wrist fracture surgery    . Laparoscopy      endometriosis   Social History  Substance Use Topics  . Smoking status: Never Smoker   . Smokeless tobacco: Never Used  . Alcohol Use: 0.0 oz/week    0 Standard drinks or equivalent per week     Comment: social   Family History  Problem Relation Age of Onset  . Cancer Father     prostate  . Thyroid disease Mother     ?  . Diabetes Mother   . Hypertension Mother   . Heart attack      grandfather (in his 76's)  . Parkinsonism      grandfater   Allergies  Allergen Reactions  . Morphine And Related Other (See Comments)    Hallucinations   Current Outpatient Prescriptions on File Prior to Visit  Medication Sig Dispense Refill  . albuterol (PROVENTIL HFA) 108 (90 BASE) MCG/ACT inhaler Inhale 2 puffs into the lungs every 4 (four) hours as needed. 3 Inhaler 1  . hydrocortisone (ANUSOL-HC) 25 MG suppository Place 1 suppository (25 mg total) rectally 2 (two) times daily. As needed for hemorrhoid 12 suppository 1  . losartan (COZAAR) 50 MG tablet Take 0.5 tablets (25 mg total) by mouth daily. 45 tablet 1  . Multiple Vitamin (MULTIVITAMIN) tablet Take 1 tablet by mouth daily.      . Norethindrone-Ethinyl Estradiol-Fe Biphas (LO LOESTRIN FE) 1 MG-10 MCG / 10 MCG tablet Take 1 tablet by mouth daily. 3 Package 1  . omeprazole (PRILOSEC) 40 MG capsule Take 1 capsule (40 mg total) by mouth daily. 90 capsule 1  . SYMBICORT 80-4.5 MCG/ACT inhaler USE 2 INHALATIONS TWICE A DAY 30.6 g 1   No current facility-administered medications on file prior to visit.    Review of Systems Review of Systems  Constitutional: Negative for  fever, appetite change, fatigue and unexpected weight change.  Eyes: Negative for pain and visual disturbance.  Respiratory: Negative for cough and shortness of breath.   Cardiovascular: Negative for cp or palpitations  Gastrointestinal: Negative for nausea, diarrhea and constipation.  Genitourinary: Negative for urgency and frequency.  Skin: Negative for pallor or rash   Neurological: Negative for weakness, light-headedness, numbness and headaches.  MSK pos for ongoing knee pain  Hematological: Negative for adenopathy. Does not bruise/bleed easily.  Psychiatric/Behavioral: Negative for dysphoric mood. The patient is not nervous/anxious.         Objective:   Physical Exam  Constitutional: She appears well-developed and well-nourished. No distress.  obese and well appearing   HENT:  Head: Normocephalic and atraumatic.  Right Ear: External ear normal.  Left Ear: External ear normal.  Nose: Nose normal.  Mouth/Throat: Oropharynx is clear and moist.  Eyes: Conjunctivae and EOM are normal. Pupils are equal, round, and reactive to light. Right eye exhibits no discharge. Left eye exhibits no discharge. No scleral icterus.  Neck: Normal range of motion. Neck supple. No JVD present. Carotid bruit is not present. No thyromegaly present.  Cardiovascular: Normal rate, regular rhythm, normal heart sounds and intact distal pulses.  Exam reveals no gallop.   Pulmonary/Chest: Effort normal and breath sounds normal. No respiratory distress. She has no wheezes. She has no rales.  Abdominal: Soft. Bowel sounds are normal. She exhibits no distension and no mass. There is no tenderness.  Musculoskeletal: She exhibits no edema or tenderness.  Limited rom both knees with discomfort   Lymphadenopathy:    She has no cervical adenopathy.  Neurological: She is alert. She has normal reflexes. No cranial nerve deficit. She exhibits normal muscle tone. Coordination normal.  Skin: Skin is warm and dry. No rash  noted. No erythema. No pallor.  Psychiatric: She has a normal mood and affect.          Assessment & Plan:   Problem List Items Addressed This Visit      Cardiovascular and Mediastinum   Essential hypertension, benign - Primary    bp in fair control at this time  BP Readings from Last 1 Encounters:  10/18/15 106/68   No changes needed Disc lifstyle change with low sodium diet and exercise  Labs reviewed Enc wt loss         Digestive   GERD    Pt had rebound symptoms trying to cut her omeprazole out for 2 wk Suggested trying it every other day or cutting dose in 1/2 first  She gets wheezing from reflux -so may need to stay on current dose Wt loss enc  Disc GERD diet         Other   Routine general medical examination at a health care facility    Reviewed health habits including diet and exercise and skin cancer prevention Reviewed appropriate screening tests for age  Also reviewed health mt list, fam hx and immunization status , as well as social and family history   See HPI Labs reviewed Stop at check out for referral for dexa  If you want to wean your omeprazole - try it every other day to start and watch diet Keep working on weight loss       Obesity    Discussed how this problem influences overall health and the risks it imposes  Reviewed plan for weight loss with lower calorie diet (via better food choices and also portion control or program like weight watchers) and exercise building up to or more than 30 minutes 5 days per week including some aerobic activity         Hyperlipidemia    Overall stable Disc  goals for lipids and reasons to control them Rev labs with pt Rev low sat fat diet in detail       Hyperglycemia    Controlled well with diet Lab Results  Component Value Date   HGBA1C 5.8 10/11/2015   Enc to keep working on wt loss       History of fracture    Pt has had several fx in adulthood Also on PPI Recommend getting baseline  dexa in light of these risk factors  Enc ca and D and exercise       Relevant Orders   DG Bone Density   Current use of proton pump inhibitor   Relevant Orders   DG Bone Density

## 2015-10-18 NOTE — Patient Instructions (Signed)
Stop at check out for referral for dexa  If you want to wean your omeprazole - try it every other day to start and watch diet Keep working on weight loss  Make an appointment with Dr Lorelei Pont on the way out

## 2015-10-18 NOTE — Progress Notes (Signed)
Pre visit review using our clinic review tool, if applicable. No additional management support is needed unless otherwise documented below in the visit note. 

## 2015-10-20 DIAGNOSIS — Z6836 Body mass index (BMI) 36.0-36.9, adult: Secondary | ICD-10-CM

## 2015-10-20 DIAGNOSIS — Z6839 Body mass index (BMI) 39.0-39.9, adult: Secondary | ICD-10-CM | POA: Insufficient documentation

## 2015-10-20 NOTE — Assessment & Plan Note (Signed)
Reviewed health habits including diet and exercise and skin cancer prevention Reviewed appropriate screening tests for age  Also reviewed health mt list, fam hx and immunization status , as well as social and family history   See HPI Labs reviewed Stop at check out for referral for dexa  If you want to wean your omeprazole - try it every other day to start and watch diet Keep working on weight loss

## 2015-10-20 NOTE — Assessment & Plan Note (Signed)
Controlled well with diet Lab Results  Component Value Date   HGBA1C 5.8 10/11/2015   Enc to keep working on wt loss

## 2015-10-20 NOTE — Assessment & Plan Note (Signed)
Overall stable Disc goals for lipids and reasons to control them Rev labs with pt Rev low sat fat diet in detail

## 2015-10-20 NOTE — Assessment & Plan Note (Signed)
Pt had rebound symptoms trying to cut her omeprazole out for 2 wk Suggested trying it every other day or cutting dose in 1/2 first  She gets wheezing from reflux -so may need to stay on current dose Wt loss enc  Disc GERD diet

## 2015-10-20 NOTE — Assessment & Plan Note (Signed)
Discussed how this problem influences overall health and the risks it imposes  Reviewed plan for weight loss with lower calorie diet (via better food choices and also portion control or program like weight watchers) and exercise building up to or more than 30 minutes 5 days per week including some aerobic activity    

## 2015-10-20 NOTE — Assessment & Plan Note (Signed)
Pt has had several fx in adulthood Also on PPI Recommend getting baseline dexa in light of these risk factors  Enc ca and D and exercise

## 2015-10-20 NOTE — Assessment & Plan Note (Signed)
bp in fair control at this time  BP Readings from Last 1 Encounters:  10/18/15 106/68   No changes needed Disc lifstyle change with low sodium diet and exercise  Labs reviewed Enc wt loss

## 2015-11-01 ENCOUNTER — Ambulatory Visit (INDEPENDENT_AMBULATORY_CARE_PROVIDER_SITE_OTHER): Payer: 59 | Admitting: Family Medicine

## 2015-11-01 ENCOUNTER — Encounter: Payer: Self-pay | Admitting: Family Medicine

## 2015-11-01 ENCOUNTER — Other Ambulatory Visit: Payer: Self-pay | Admitting: Family Medicine

## 2015-11-01 ENCOUNTER — Ambulatory Visit (INDEPENDENT_AMBULATORY_CARE_PROVIDER_SITE_OTHER)
Admission: RE | Admit: 2015-11-01 | Discharge: 2015-11-01 | Disposition: A | Discharge status: Home / Self Care | Payer: 59 | Source: Ambulatory Visit | Attending: Family Medicine | Admitting: Family Medicine

## 2015-11-01 VITALS — BP 124/84 | HR 69 | Temp 98.8°F | Wt 194.8 lb

## 2015-11-01 DIAGNOSIS — M25562 Pain in left knee: Secondary | ICD-10-CM

## 2015-11-01 DIAGNOSIS — M6752 Plica syndrome, left knee: Secondary | ICD-10-CM | POA: Diagnosis not present

## 2015-11-01 DIAGNOSIS — M2392 Unspecified internal derangement of left knee: Secondary | ICD-10-CM

## 2015-11-01 NOTE — Progress Notes (Signed)
Dr. Frederico Hamman T. Deforest Maiden, MD, [facility] [place], [address] Phone: [phone] Fax: [phone]  11/01/2015  Patient: Patty Williams, MRN: [id number], DOB: April 03, 1961, 55 y.o.  Primary Physician:  Loura Pardon, MD   Chief Complaint  Patient presents with  . Knee Pain    left   Subjective:   Patty Williams is a 55 y.o. very pleasant female patient who presents with the following:  The patient remembers a specific event in August 2016 where she hurt her left knee, and then after that point she was unable to go up and down stairs without pain. She is having trouble walking, rising from a seated position at work, and doing exercise.  Previously she saw Dr. Layne Benton in September 2016, and she did an intra-articular knee injection. That provided no relief.  She describes a sensation where she will have a mechanical block and functional giving way of her knee occasionally. This is intermittent, but it is present most of the time. She has been doing some topical manipulation from her chiropractor, which has helped, and over the last week after her previous manipulation she has gotten quite a bit better in terms of her symptoms.  Now swollen, pain, trouble going down stairs.  Able to go up stairs now.  Now only a week able to go up stairs.  At times, knee would buckle on the left.   Past Medical History, Surgical History, Social History, Family History, Problem List, Medications, and Allergies have been reviewed and updated if relevant.  Patient Active Problem List   Diagnosis Date Noted  . Obesity 10/20/2015  . Current use of proton pump inhibitor 10/18/2015  . History of fracture 10/18/2015  . Left knee pain 04/19/2015  . Frequent UTI 10/06/2013  . Seasonal and perennial allergic rhinitis 01/01/2013  . Recurrent UTI 08/12/2011  . Routine general medical examination at a health care facility 04/30/2011  . Hyperglycemia    . Plantar fasciitis   . METATARSALGIA 04/11/2010  . 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 Obstructive sleep apnea 06/10/2009  . GERD 11/09/2008  . Hyperlipidemia 07/20/2008  . Essential hypertension, benign 03/02/2008  . ASTHMA 03/29/2007  . ENDOMETRIOSIS 03/29/2007  . ADD (attention deficit disorder) 12/10/2006    Past Medical History  Diagnosis Date  . Obesity   . Stress reaction   . Elevated blood pressure reading without diagnosis of hypertension   . Endometriosis   . GERD (gastroesophageal reflux disease)   . ADD (attention deficit disorder) 12/2006    adult  . Asthma   . HLD (hyperlipidemia)   . Diabetes mellitus type II     mild  . OSA on CPAP   . Plantar fasciitis     with orthotics-much difficulty adjusting these    Past Surgical History  Procedure Laterality Date  . Hemorrhoid surgery    . Uterine suspension    . Wrist fracture surgery    . Laparoscopy      endometriosis    Social History   Social History  . Marital Status: Single    Spouse Name: N/A  . Number of Children: N/A  . Years of Education: N/A   Occupational History  . Dentist    Social History Main Topics  . Smoking status: Never Smoker   . Smokeless tobacco: Never Used  . Alcohol Use: 0.0 oz/week    0 Standard drinks or equivalent per week     Comment:  social  . Drug Use: No  . Sexual Activity: Not on file   Other Topics Concern  . Not on file   Social History Narrative   Permanent life partner-female    Family History  Problem Relation Age of Onset  . Cancer Father     prostate  . Thyroid disease Mother     ?  . Diabetes Mother   . Hypertension Mother   . Heart attack      grandfather (in his 42's)  . Parkinsonism      grandfater    Allergies  Allergen Reactions  . Morphine And Related Other (See Comments)    Hallucinations    Medication list reviewed and updated in full in [place].  GEN: No fevers, chills. Nontoxic. Primarily MSK c/o  today. MSK: Detailed in the HPI GI: tolerating PO intake without difficulty Neuro: No numbness, parasthesias, or tingling associated. Otherwise the pertinent positives of the ROS are noted above.   Objective:   BP 124/84 mmHg  Pulse 69  Temp(Src) 98.8 F (37.1 C) (Oral)  Wt 194 lb 12 oz (88.338 kg)  SpO2 96%   GEN: WDWN, NAD, Non-toxic, Alert & Oriented x 3 HEENT: Atraumatic, Normocephalic.  Ears and Nose: No external deformity. EXTR: No clubbing/cyanosis/edema NEURO: Normal gait.  PSYCH: Normally interactive. Conversant. Not depressed or anxious appearing.  Calm demeanor.   Knee:  L Gait: Normal heel toe pattern ROM: 0-120 Effusion: neg Echymosis or edema: none Patellar tendon NT Painful PLICA: 2 significant painful lateral plical bands Patellar grind: negative Medial and lateral patellar facet loading: negative medial and lateral joint lines: mild tenderness medially and laterally on joint lines Mcmurray's pain, no mechanical symptoms Flexion-pinch mild pain Varus and valgus stress: stable Lachman: neg Ant and Post drawer: neg Hip abduction, IR, ER: WNL Hip flexion str: 5/5 Hip abd: 5/5 Quad: 5/5 VMO atrophy:No Hamstring concentric and eccentric: 5/5   Radiology: Dg Knee Ap/lat W/sunrise Left  11/01/2015  CLINICAL DATA:  Left knee pain.  Initial evaluation . EXAM: LEFT KNEE 3 VIEWS COMPARISON:  MRI 01/18/2004. FINDINGS: No acute bony or joint abnormality identified. No evidence of fracture or dislocation. Scratched tricompartment degenerative change most prominent about the medial compartment. IMPRESSION: 1. No acute abnormality. 2. Tricompartment degenerative change most prominent about the medial compartment . Electronically Signed   By: Marcello Moores  Register   On: 11/01/2015 09:03    The radiological images were independently reviewed by myself in the office and results were reviewed with the patient. 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 In my opinion, no significant lateral or patellofemoral component of osteoarthritic change. No fracture or dislocation. Electronically Signed  By: Owens Loffler, MD On: 11/01/2015 2:54 PM   Assessment and Plan:   Internal derangement of left knee  Plica syndrome of knee, left  >25 minutes spent in face to face time with patient, >50% spent in counselling or coordination of care  By history, I suspect that she sustained a meniscal tear in August 2016. Since then she has been having mechanical symptoms and buckling of her knee. She is also having some significant pain. Right now, it is doing a little bit better, which is not surprising.  She definitely has painful plical bands laterally, which are causing pain with going up and downstairs and rising from a seated position.  For now, she would like to continue to be conservative as she feels somewhat better this week. She is  going to start a workout program with Collier Bullock, and he should be able to work on her leg and core strengthening with some limitations. Reviewed conservative plical band treatment.  I think at any point given her history of tinea an MRI to evaluate for a significant meniscal tear would be reasonable. For now she is going to do a trial of continued conservative care.  I appreciate the opportunity to evaluate this very friendly patient. If you have any question regarding her care or prognosis, do not hesitate to ask.   Follow-up: at any point if not improving  PLICA BAND or PLICA on the knee  Band of tissue / Kink in the synovial lining. The joint lining.  Use your fingers a few times a day for the next few weeks to massage that area to help break down the tissue. You can also use an ice cube.   Signed,  Maud Deed. Jonluke Cobbins, MD   Patient's Medications  New Prescriptions   No medications on file  Previous Medications   ALBUTEROL (PROVENTIL HFA) 108 (90 BASE) MCG/ACT INHALER     Inhale 2 puffs into the lungs every 4 (four) hours as needed.   HYDROCORTISONE (ANUSOL-HC) 25 MG SUPPOSITORY    Place 1 suppository (25 mg total) rectally 2 (two) times daily. As needed for hemorrhoid   LOSARTAN (COZAAR) 50 MG TABLET    Take 0.5 tablets (25 mg total) by mouth daily.   MULTIPLE VITAMIN (MULTIVITAMIN) TABLET    Take 1 tablet by mouth daily.     NORETHINDRONE-ETHINYL ESTRADIOL-FE BIPHAS (LO LOESTRIN FE) 1 MG-10 MCG / 10 MCG TABLET    Take 1 tablet by mouth daily.   OMEGA-3 FATTY ACIDS (FISH OIL) 1000 MG CAPS    Take 2,000 mg by mouth daily.   OMEPRAZOLE (PRILOSEC) 40 MG CAPSULE    Take 1 capsule (40 mg total) by mouth daily.   SYMBICORT 80-4.5 MCG/ACT INHALER    USE 2 INHALATIONS TWICE A DAY  Modified Medications   No medications on file  Discontinued Medications   No medications on file

## 2015-11-01 NOTE — Progress Notes (Signed)
Pre visit review using our clinic review tool, if applicable. No additional management support is needed unless otherwise documented below in the visit note. 

## 2015-11-04 ENCOUNTER — Encounter: Payer: Self-pay | Admitting: Family Medicine

## 2015-11-06 ENCOUNTER — Other Ambulatory Visit: Payer: Self-pay | Admitting: Family Medicine

## 2015-11-06 MED ORDER — NONFORMULARY OR COMPOUNDED ITEM: Status: DC

## 2016-01-03 ENCOUNTER — Encounter: Payer: Self-pay | Admitting: Family Medicine

## 2016-01-03 MED ORDER — OMEPRAZOLE 20 MG PO CPDR: 40.0000 mg | DELAYED_RELEASE_CAPSULE | Freq: Every day | ORAL | 11 refills | Status: DC

## 2016-01-03 NOTE — Telephone Encounter (Signed)
Pt is titrating her omeprazole - sent px for 2 pills of omeprazole 20 daily -she will see how she does and if she can reduce to 1 in the future

## 2016-01-24 ENCOUNTER — Encounter: Payer: Self-pay | Admitting: Family Medicine

## 2016-01-24 ENCOUNTER — Ambulatory Visit (INDEPENDENT_AMBULATORY_CARE_PROVIDER_SITE_OTHER)
Admission: RE | Admit: 2016-01-24 | Discharge: 2016-01-24 | Disposition: A | Discharge status: Home / Self Care | Payer: 59 | Source: Ambulatory Visit | Attending: Family Medicine | Admitting: Family Medicine

## 2016-01-24 DIAGNOSIS — Z8781 Personal history of (healed) traumatic fracture: Secondary | ICD-10-CM

## 2016-01-24 DIAGNOSIS — Z79899 Other long term (current) drug therapy: Secondary | ICD-10-CM

## 2016-02-18 ENCOUNTER — Other Ambulatory Visit: Payer: Self-pay | Admitting: Family Medicine

## 2016-03-12 LAB — HM PAP SMEAR: HM PAP: NORMAL

## 2016-08-05 ENCOUNTER — Other Ambulatory Visit: Payer: Self-pay | Admitting: Family Medicine

## 2016-10-16 ENCOUNTER — Other Ambulatory Visit: Payer: 59

## 2016-10-23 ENCOUNTER — Encounter: Payer: 59 | Admitting: Family Medicine

## 2016-12-03 ENCOUNTER — Other Ambulatory Visit: Payer: Self-pay | Admitting: Family Medicine

## 2016-12-04 ENCOUNTER — Other Ambulatory Visit: Payer: 59

## 2016-12-08 ENCOUNTER — Telehealth: Payer: Self-pay | Admitting: Family Medicine

## 2016-12-08 DIAGNOSIS — R739 Hyperglycemia, unspecified: Secondary | ICD-10-CM

## 2016-12-08 DIAGNOSIS — Z0000 Encounter for general adult medical examination without abnormal findings: Secondary | ICD-10-CM

## 2016-12-08 NOTE — Telephone Encounter (Signed)
-----   Message from Ellamae Sia sent at 11/30/2016 12:30 PM EDT ----- Regarding: Lab orders for Thursday, 8.2.18 Patient is scheduled for CPX labs, please order future labs, Thanks , Karna Christmas

## 2016-12-10 ENCOUNTER — Other Ambulatory Visit (INDEPENDENT_AMBULATORY_CARE_PROVIDER_SITE_OTHER): Payer: 59

## 2016-12-10 DIAGNOSIS — Z0000 Encounter for general adult medical examination without abnormal findings: Secondary | ICD-10-CM | POA: Diagnosis not present

## 2016-12-10 DIAGNOSIS — R739 Hyperglycemia, unspecified: Secondary | ICD-10-CM

## 2016-12-10 LAB — CBC WITH DIFFERENTIAL/PLATELET
Basophils Absolute: 0 10*3/uL (ref 0.0–0.1)
Basophils Relative: 0.5 % (ref 0.0–3.0)
EOS ABS: 0.1 10*3/uL (ref 0.0–0.7)
EOS PCT: 1.6 % (ref 0.0–5.0)
HEMATOCRIT: 38.4 % (ref 36.0–46.0)
HEMOGLOBIN: 12.7 g/dL (ref 12.0–15.0)
LYMPHS PCT: 36.7 % (ref 12.0–46.0)
Lymphs Abs: 2.2 10*3/uL (ref 0.7–4.0)
MCHC: 33 g/dL (ref 30.0–36.0)
MCV: 89.8 fl (ref 78.0–100.0)
MONO ABS: 0.5 10*3/uL (ref 0.1–1.0)
Monocytes Relative: 7.9 % (ref 3.0–12.0)
Neutro Abs: 3.2 10*3/uL (ref 1.4–7.7)
Neutrophils Relative %: 53.3 % (ref 43.0–77.0)
Platelets: 269 10*3/uL (ref 150.0–400.0)
RBC: 4.27 Mil/uL (ref 3.87–5.11)
RDW: 14.4 % (ref 11.5–15.5)
WBC: 6.1 10*3/uL (ref 4.0–10.5)

## 2016-12-10 LAB — COMPREHENSIVE METABOLIC PANEL
ALK PHOS: 53 U/L (ref 39–117)
ALT: 20 U/L (ref 0–35)
AST: 17 U/L (ref 0–37)
Albumin: 4.1 g/dL (ref 3.5–5.2)
BUN: 16 mg/dL (ref 6–23)
CALCIUM: 9.1 mg/dL (ref 8.4–10.5)
CO2: 28 mEq/L (ref 19–32)
CREATININE: 0.66 mg/dL (ref 0.40–1.20)
Chloride: 106 mEq/L (ref 96–112)
GFR: 98.21 mL/min (ref >60.00)
Glucose, Bld: 105 mg/dL — ABNORMAL HIGH (ref 70–99)
Potassium: 4.2 mEq/L (ref 3.5–5.1)
SODIUM: 141 meq/L (ref 135–145)
TOTAL PROTEIN: 7.1 g/dL (ref 6.0–8.3)
Total Bilirubin: 0.4 mg/dL (ref 0.2–1.2)

## 2016-12-10 LAB — LIPID PANEL
Cholesterol: 154 mg/dL (ref 0–200)
HDL: 47.2 mg/dL (ref >39.00)
LDL Cholesterol: 96 mg/dL (ref 0–99)
NONHDL: 106.39
Total CHOL/HDL Ratio: 3
Triglycerides: 54 mg/dL (ref 0.0–149.0)
VLDL: 10.8 mg/dL (ref 0.0–40.0)

## 2016-12-10 LAB — HEMOGLOBIN A1C: HEMOGLOBIN A1C: 6.2 % (ref 4.6–6.5)

## 2016-12-10 LAB — TSH: TSH: 1.97 u[IU]/mL (ref 0.35–4.50)

## 2016-12-11 ENCOUNTER — Ambulatory Visit (INDEPENDENT_AMBULATORY_CARE_PROVIDER_SITE_OTHER): Payer: 59 | Admitting: Family Medicine

## 2016-12-11 ENCOUNTER — Encounter: Payer: Self-pay | Admitting: Family Medicine

## 2016-12-11 VITALS — BP 122/82 | HR 76 | Ht 65.0 in | Wt 206.8 lb

## 2016-12-11 DIAGNOSIS — R7303 Prediabetes: Secondary | ICD-10-CM | POA: Diagnosis not present

## 2016-12-11 DIAGNOSIS — E6609 Other obesity due to excess calories: Secondary | ICD-10-CM

## 2016-12-11 DIAGNOSIS — Z0000 Encounter for general adult medical examination without abnormal findings: Secondary | ICD-10-CM

## 2016-12-11 DIAGNOSIS — Z6834 Body mass index (BMI) 34.0-34.9, adult: Secondary | ICD-10-CM

## 2016-12-11 DIAGNOSIS — E7800 Pure hypercholesterolemia, unspecified: Secondary | ICD-10-CM

## 2016-12-11 DIAGNOSIS — I10 Essential (primary) hypertension: Secondary | ICD-10-CM

## 2016-12-11 DIAGNOSIS — K219 Gastro-esophageal reflux disease without esophagitis: Secondary | ICD-10-CM

## 2016-12-11 MED ORDER — OMEPRAZOLE 20 MG PO CPDR: 40.0000 mg | DELAYED_RELEASE_CAPSULE | Freq: Every day | ORAL | 11 refills | Status: DC

## 2016-12-11 MED ORDER — BUDESONIDE-FORMOTEROL FUMARATE 80-4.5 MCG/ACT IN AERO: INHALATION_SPRAY | RESPIRATORY_TRACT | 11 refills | Status: DC

## 2016-12-11 MED ORDER — LOSARTAN POTASSIUM 50 MG PO TABS: 25.0000 mg | ORAL_TABLET | Freq: Every day | ORAL | 11 refills | Status: DC

## 2016-12-11 NOTE — Assessment & Plan Note (Signed)
bp in fair control at this time  BP Readings from Last 1 Encounters:  12/11/16 122/82   No changes needed Disc lifstyle change with low sodium diet and exercise She will continue losartan and exercise

## 2016-12-11 NOTE — Assessment & Plan Note (Signed)
Discussed how this problem influences overall health and the risks it imposes  Reviewed plan for weight loss with lower calorie diet (via better food choices and also portion control or program like weight watchers) and exercise building up to or more than 30 minutes 5 days per week including some aerobic activity   She will continue to work on this Disc poss need for CBT to help with emotional eating in the future

## 2016-12-11 NOTE — Assessment & Plan Note (Addendum)
Reviewed health habits including diet and exercise and skin cancer prevention Reviewed appropriate screening tests for age  Also reviewed health mt list, fam hx and immunization status , as well as social and family history   See HPI Labs reviewed  Disc plan for wt loss and enc to continue exercise  Px done for massage tx for her msk back pain so she may be able to write it off

## 2016-12-11 NOTE — Progress Notes (Signed)
Subjective:    Patient ID: Patty Williams, female    DOB: [birth year], 56 y.o.   MRN: [id number]  HPI Here for health maintenance exam and to review chronic medical problems    Has a headache today/mild  A hard year - had to go to [place] to see her mother emergently  She had parkinson like syndrome and pulmonary fibrosis   Wt Readings from Last 3 Encounters:  12/11/16 206 lb 12.8 oz (93.8 kg)  11/01/15 194 lb 12 oz (88.3 kg)  10/18/15 194 lb 4 oz (88.1 kg)  trouble loosing - she gained wt when out of the country with [place]  She is going to the gym at least 5 times per week  34.41 kg/m  dexa 9/17-bmd in normal range  Taking daily vit  No falls or fx   Goes to gyn 11/17  Will schedule when able  Following [facility] -on hormones (not menopausal yet)   Mammogram 9/17 -neg- scheduled sept 21st Self breast exam -no lumps but does not always examine   Colonoscopy 9/10 with 10 y recall  Is interested in egd with it next time due to long term reflux req bid ppi    Gets flu shots in the fall   Tdap 12/12   PNA 23 12/12   Doing fine with her asthma with symbicort   Still treating gerd   Has a lot of mid back/muscular pain  Needs a px for massage tx- weekly  Has to stay hunched at work (dentist)  Going to chiropractor and going to the gym   bp is stable today  No cp or palpitations or headaches or edema  No side effects to medicines  BP Readings from Last 3 Encounters:  12/11/16 122/82  11/01/15 124/84  10/18/15 106/68     Hyperglycemia Lab Results  Component Value Date   HGBA1C 6.2 12/10/2016  up from 5.8  Plans to eat less carbs  May want to see a counselor for emotional eating   Cholesterol  Lab Results  Component Value Date   CHOL 154 12/10/2016   CHOL 167 10/11/2015   CHOL 168 04/12/2015   Lab Results  Component Value Date   HDL 47.20 12/10/2016   HDL 44.80 10/11/2015   HDL 45.20 04/12/2015   Lab Results  Component Value Date   LDLCALC 96  12/10/2016   LDLCALC 110 (H) 10/11/2015   LDLCALC 104 (H) 04/12/2015   Lab Results  Component Value Date   TRIG 54.0 12/10/2016   TRIG 64.0 10/11/2015   TRIG 93.0 04/12/2015   Lab Results  Component Value Date   CHOLHDL 3 12/10/2016   CHOLHDL 4 10/11/2015   CHOLHDL 4 04/12/2015   Lab Results  Component Value Date   LDLDIRECT 144.2 09/21/2008   Improved with exercise   Lab Results  Component Value Date   WBC 6.1 12/10/2016   HGB 12.7 12/10/2016   HCT 38.4 12/10/2016   MCV 89.8 12/10/2016   PLT 269.0 12/10/2016     Chemistry      Component Value Date/Time   NA 141 12/10/2016 0805   K 4.2 12/10/2016 0805   CL 106 12/10/2016 0805   CO2 28 12/10/2016 0805   BUN 16 12/10/2016 0805   CREATININE 0.66 12/10/2016 0805      Component Value Date/Time   CALCIUM 9.1 12/10/2016 0805   ALKPHOS 53 12/10/2016 0805   AST 17 12/10/2016 0805   ALT 20 12/10/2016 0805   BILITOT 0.4  12/10/2016 0805     glucose 105   Lab Results  Component Value Date   TSH 1.97 12/10/2016    Patient Active Problem List   Diagnosis Date Noted  . Obesity 10/20/2015  . Current use of proton pump inhibitor 10/18/2015  . History of fracture 10/18/2015  . Left knee pain 04/19/2015  . Frequent UTI 10/06/2013  . Seasonal and perennial allergic rhinitis 01/01/2013  . Routine general medical examination at a health care facility 04/30/2011  . Hyperglycemia   . Plantar fasciitis   . METATARSALGIA 04/11/2010  . 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 Obstructive sleep apnea 06/10/2009  . GERD 11/09/2008  . Hyperlipidemia 07/20/2008  . Essential hypertension, benign 03/02/2008  . ASTHMA 03/29/2007  . ENDOMETRIOSIS 03/29/2007  . ADD (attention deficit disorder) 12/10/2006   Past Medical History:  Diagnosis Date  . ADD (attention deficit disorder) 12/2006   adult  . Asthma   . Diabetes mellitus type II    mild  . Elevated blood pressure reading without diagnosis of hypertension   .  Endometriosis   . GERD (gastroesophageal reflux disease)   . HLD (hyperlipidemia)   . Obesity   . OSA on CPAP   . Plantar fasciitis    with orthotics-much difficulty adjusting these  . Stress reaction    Past Surgical History:  Procedure Laterality Date  . HEMORRHOID SURGERY    . LAPAROSCOPY     endometriosis  . UTERINE SUSPENSION    . WRIST FRACTURE SURGERY     Social History  Substance Use Topics  . Smoking status: Never Smoker  . Smokeless tobacco: Never Used  . Alcohol use 0.0 oz/week     Comment: social   Family History  Problem Relation Age of Onset  . Cancer Father        prostate  . Thyroid disease Mother        ?  . Diabetes Mother   . Hypertension Mother   . Heart attack Unknown        grandfather (in his 27's)  . Parkinsonism Unknown        grandfater   Allergies  Allergen Reactions  . Morphine And Related Other (See Comments)    Hallucinations   Current Outpatient Prescriptions on File Prior to Visit  Medication Sig Dispense Refill  . albuterol (PROVENTIL HFA) 108 (90 BASE) MCG/ACT inhaler Inhale 2 puffs into the lungs every 4 (four) hours as needed. 3 Inhaler 1  . hydrocortisone (ANUSOL-HC) 25 MG suppository Place 1 suppository (25 mg total) rectally 2 (two) times daily. As needed for hemorrhoid 12 suppository 1  . Multiple Vitamin (MULTIVITAMIN) tablet Take 1 tablet by mouth daily.      . NONFORMULARY OR COMPOUNDED ITEM Exercise prescription:  Personal training sessions for patient to strengthen quadriceps musculature and core stability for knee pain and knee stability. Dx: M67.52, M23.92 1 each 0  . Omega-3 Fatty Acids (FISH OIL) 1000 MG CAPS Take 2,000 mg by mouth daily.     No current facility-administered medications on file prior to visit.     Review of Systems Review of Systems  Constitutional: Negative for fever, appetite change, fatigue and unexpected weight change.  Eyes: Negative for pain and visual disturbance.  Respiratory: Negative  for cough and shortness of breath.   Cardiovascular: Negative for cp or palpitations    Gastrointestinal: Negative for nausea, diarrhea and constipation. pos for heartburn if she eats late  Genitourinary: Negative for urgency and frequency.  Skin:  Negative for pallor or rash   MSK pos for occ foot pain from plantar fasciitis and knee pain  Pos for mid back pain from posture problems (considering massage) Neurological: Negative for weakness, light-headedness, numbness and headaches.  Hematological: Negative for adenopathy. Does not bruise/bleed easily.  Psychiatric/Behavioral: Negative for dysphoric mood. The patient is not nervous/anxious.         Objective:   Physical Exam  Constitutional: She appears well-developed and well-nourished. No distress.  obese and well appearing   HENT:  Head: Normocephalic and atraumatic.  Right Ear: External ear normal.  Left Ear: External ear normal.  Nose: Nose normal.  Mouth/Throat: Oropharynx is clear and moist.  Eyes: Pupils are equal, round, and reactive to light. Conjunctivae and EOM are normal. Right eye exhibits no discharge. Left eye exhibits no discharge. No scleral icterus.  Neck: Normal range of motion. Neck supple. No JVD present. Carotid bruit is not present. No thyromegaly present.  Cardiovascular: Normal rate, regular rhythm, normal heart sounds and intact distal pulses.  Exam reveals no gallop.   Pulmonary/Chest: Effort normal and breath sounds normal. No respiratory distress. She has no wheezes. She has no rales.  Abdominal: Soft. Bowel sounds are normal. She exhibits no distension and no mass. There is no tenderness.  Musculoskeletal: She exhibits no edema or tenderness.  Lymphadenopathy:    She has no cervical adenopathy.  Neurological: She is alert. She has normal reflexes. No cranial nerve deficit. She exhibits normal muscle tone. Coordination normal.  Skin: Skin is warm and dry. No rash noted. No erythema. No pallor.  Few  lentigines Olive complexion  Psychiatric: She has a normal mood and affect.  Pleasant and talkative           Assessment & Plan:   Problem List Items Addressed This Visit      Cardiovascular and Mediastinum   Essential hypertension, benign    bp in fair control at this time  BP Readings from Last 1 Encounters:  12/11/16 122/82   No changes needed Disc lifstyle change with low sodium diet and exercise She will continue losartan and exercise         Relevant Medications   losartan (COZAAR) 50 MG tablet     Digestive   GERD    Some breakthrough symptoms if she eats late -she will try to get out of that habit If no imp may consider GI visit/ poss EGD      Relevant Medications   omeprazole (PRILOSEC) 20 MG capsule     Other   Hyperlipidemia    Disc goals for lipids and reasons to control them Rev labs with pt Rev low sat fat diet in detail Improved today        Relevant Medications   losartan (COZAAR) 50 MG tablet   Obesity    Discussed how this problem influences overall health and the risks it imposes  Reviewed plan for weight loss with lower calorie diet (via better food choices and also portion control or program like weight watchers) and exercise building up to or more than 30 minutes 5 days per week including some aerobic activity   She will continue to work on this Disc poss need for CBT to help with emotional eating in the future       Prediabetes    Lab Results  Component Value Date   HGBA1C 6.2 12/10/2016   Up a bit  Disc plan for wt loss disc imp of low glycemic diet and  wt loss to prevent DM2       Routine general medical examination at a health care facility - Primary    Reviewed health habits including diet and exercise and skin cancer prevention Reviewed appropriate screening tests for age  Also reviewed health mt list, fam hx and immunization status , as well as social and family history   See HPI Labs reviewed  Disc plan for wt loss  and enc to continue exercise  Px done for massage tx for her msk back pain so she may be able to write it off

## 2016-12-11 NOTE — Assessment & Plan Note (Signed)
Some breakthrough symptoms if she eats late -she will try to get out of that habit If no imp may consider GI visit/ poss EGD

## 2016-12-11 NOTE — Assessment & Plan Note (Signed)
Disc goals for lipids and reasons to control them Rev labs with pt Rev low sat fat diet in detail Improved today

## 2016-12-11 NOTE — Patient Instructions (Addendum)
Make sure to get 1000 or more iu of vit D daily   Take care of yourself  Keep exercising   Try to get your carb from produce (not white potatoes) and avoid processed carbs as much as you can

## 2016-12-11 NOTE — Assessment & Plan Note (Signed)
Lab Results  Component Value Date   HGBA1C 6.2 12/10/2016   Up a bit  Disc plan for wt loss disc imp of low glycemic diet and wt loss to prevent DM2

## 2017-01-29 ENCOUNTER — Encounter: Payer: Self-pay | Admitting: Family Medicine

## 2017-04-25 ENCOUNTER — Encounter: Payer: Self-pay | Admitting: Family Medicine

## 2017-05-02 MED ORDER — LOSARTAN POTASSIUM 50 MG PO TABS: 25.0000 mg | ORAL_TABLET | Freq: Every day | ORAL | 11 refills | Status: DC

## 2017-05-02 MED ORDER — ALBUTEROL SULFATE HFA 108 (90 BASE) MCG/ACT IN AERS: 2.0000 | INHALATION_SPRAY | RESPIRATORY_TRACT | 1 refills | Status: DC | PRN

## 2017-05-02 MED ORDER — OMEPRAZOLE 20 MG PO CPDR: 40.0000 mg | DELAYED_RELEASE_CAPSULE | Freq: Every day | ORAL | 11 refills | Status: DC

## 2017-12-05 ENCOUNTER — Telehealth: Payer: Self-pay | Admitting: Family Medicine

## 2017-12-05 DIAGNOSIS — I10 Essential (primary) hypertension: Secondary | ICD-10-CM

## 2017-12-05 DIAGNOSIS — E7800 Pure hypercholesterolemia, unspecified: Secondary | ICD-10-CM

## 2017-12-05 DIAGNOSIS — R7303 Prediabetes: Secondary | ICD-10-CM

## 2017-12-05 DIAGNOSIS — Z0000 Encounter for general adult medical examination without abnormal findings: Secondary | ICD-10-CM

## 2017-12-05 NOTE — Telephone Encounter (Signed)
-----   Message from Lendon Collar, RT sent at 12/01/2017 11:41 AM EDT ----- Regarding: Lab orders for Friday 12/10/17 Please enter CPE lab orders for 12/10/17. Thanks-Lauren

## 2017-12-10 ENCOUNTER — Other Ambulatory Visit (INDEPENDENT_AMBULATORY_CARE_PROVIDER_SITE_OTHER): Payer: 59

## 2017-12-10 DIAGNOSIS — E7800 Pure hypercholesterolemia, unspecified: Secondary | ICD-10-CM | POA: Diagnosis not present

## 2017-12-10 DIAGNOSIS — R7303 Prediabetes: Secondary | ICD-10-CM | POA: Diagnosis not present

## 2017-12-10 DIAGNOSIS — I10 Essential (primary) hypertension: Secondary | ICD-10-CM | POA: Diagnosis not present

## 2017-12-10 LAB — HEMOGLOBIN A1C: Hgb A1c MFr Bld: 6.1 % (ref 4.6–6.5)

## 2017-12-10 LAB — LIPID PANEL
CHOL/HDL RATIO: 3
Cholesterol: 154 mg/dL (ref 0–200)
HDL: 47.8 mg/dL (ref >39.00)
LDL CALC: 95 mg/dL (ref 0–99)
NonHDL: 106.07
Triglycerides: 54 mg/dL (ref 0.0–149.0)
VLDL: 10.8 mg/dL (ref 0.0–40.0)

## 2017-12-10 LAB — COMPREHENSIVE METABOLIC PANEL
ALT: 27 U/L (ref 0–35)
AST: 17 U/L (ref 0–37)
Albumin: 4 g/dL (ref 3.5–5.2)
Alkaline Phosphatase: 56 U/L (ref 39–117)
BUN: 15 mg/dL (ref 6–23)
CALCIUM: 8.7 mg/dL (ref 8.4–10.5)
CHLORIDE: 107 meq/L (ref 96–112)
CO2: 24 mEq/L (ref 19–32)
CREATININE: 0.63 mg/dL (ref 0.40–1.20)
GFR: 103.26 mL/min (ref >60.00)
Glucose, Bld: 109 mg/dL — ABNORMAL HIGH (ref 70–99)
POTASSIUM: 4 meq/L (ref 3.5–5.1)
SODIUM: 140 meq/L (ref 135–145)
Total Bilirubin: 0.3 mg/dL (ref 0.2–1.2)
Total Protein: 7 g/dL (ref 6.0–8.3)

## 2017-12-10 LAB — CBC WITH DIFFERENTIAL/PLATELET
BASOS PCT: 0.5 % (ref 0.0–3.0)
Basophils Absolute: 0 10*3/uL (ref 0.0–0.1)
EOS PCT: 1.9 % (ref 0.0–5.0)
Eosinophils Absolute: 0.1 10*3/uL (ref 0.0–0.7)
HCT: 38.1 % (ref 36.0–46.0)
Hemoglobin: 12.6 g/dL (ref 12.0–15.0)
LYMPHS ABS: 2.2 10*3/uL (ref 0.7–4.0)
LYMPHS PCT: 33.3 % (ref 12.0–46.0)
MCHC: 33.2 g/dL (ref 30.0–36.0)
MCV: 89.7 fl (ref 78.0–100.0)
MONO ABS: 0.6 10*3/uL (ref 0.1–1.0)
Monocytes Relative: 8.5 % (ref 3.0–12.0)
NEUTROS ABS: 3.7 10*3/uL (ref 1.4–7.7)
Neutrophils Relative %: 55.8 % (ref 43.0–77.0)
Platelets: 267 10*3/uL (ref 150.0–400.0)
RBC: 4.25 Mil/uL (ref 3.87–5.11)
RDW: 14.4 % (ref 11.5–15.5)
WBC: 6.7 10*3/uL (ref 4.0–10.5)

## 2017-12-10 LAB — TSH: TSH: 1.86 u[IU]/mL (ref 0.35–4.50)

## 2017-12-17 ENCOUNTER — Encounter: Payer: Self-pay | Admitting: Family Medicine

## 2017-12-17 ENCOUNTER — Ambulatory Visit (INDEPENDENT_AMBULATORY_CARE_PROVIDER_SITE_OTHER): Payer: 59 | Admitting: Family Medicine

## 2017-12-17 VITALS — BP 128/86 | HR 72 | Temp 98.6°F | Ht 65.0 in | Wt 220.5 lb

## 2017-12-17 DIAGNOSIS — Z23 Encounter for immunization: Secondary | ICD-10-CM | POA: Diagnosis not present

## 2017-12-17 DIAGNOSIS — E7800 Pure hypercholesterolemia, unspecified: Secondary | ICD-10-CM | POA: Diagnosis not present

## 2017-12-17 DIAGNOSIS — Z0000 Encounter for general adult medical examination without abnormal findings: Secondary | ICD-10-CM | POA: Diagnosis not present

## 2017-12-17 DIAGNOSIS — Z6836 Body mass index (BMI) 36.0-36.9, adult: Secondary | ICD-10-CM

## 2017-12-17 DIAGNOSIS — I10 Essential (primary) hypertension: Secondary | ICD-10-CM

## 2017-12-17 DIAGNOSIS — K219 Gastro-esophageal reflux disease without esophagitis: Secondary | ICD-10-CM

## 2017-12-17 DIAGNOSIS — R7303 Prediabetes: Secondary | ICD-10-CM

## 2017-12-17 MED ORDER — OMEPRAZOLE 20 MG PO CPDR: 40.0000 mg | DELAYED_RELEASE_CAPSULE | Freq: Every day | ORAL | 11 refills | Status: DC

## 2017-12-17 MED ORDER — LOSARTAN POTASSIUM 50 MG PO TABS: 25.0000 mg | ORAL_TABLET | Freq: Every day | ORAL | 11 refills | Status: DC

## 2017-12-17 MED ORDER — BUDESONIDE-FORMOTEROL FUMARATE 80-4.5 MCG/ACT IN AERO: INHALATION_SPRAY | RESPIRATORY_TRACT | 11 refills | Status: DC

## 2017-12-17 MED ORDER — ALBUTEROL SULFATE HFA 108 (90 BASE) MCG/ACT IN AERS: 2.0000 | INHALATION_SPRAY | RESPIRATORY_TRACT | 1 refills | Status: DC | PRN

## 2017-12-17 NOTE — Assessment & Plan Note (Signed)
bp in fair control at this time  BP Readings from Last 1 Encounters:  12/17/17 128/86   No changes needed Most recent labs reviewed  Disc lifstyle change with low sodium diet and exercise

## 2017-12-17 NOTE — Assessment & Plan Note (Signed)
Stable Lab Results  Component Value Date   HGBA1C 6.1 12/10/2017   disc imp of low glycemic diet and wt loss to prevent DM2 Pt plans to see a nutritionist in [facility]- given px/order for this

## 2017-12-17 NOTE — Patient Instructions (Addendum)
If you are interested in the new shingles vaccine (Shingrix) - call your local pharmacy to check on coverage and availability  Get on a wait list for it if affordable    Get started with nutrition therapy for prediabetes and help with weight loss  Take care of yourself  Continue omeprazole Add ranitidine 150 mg at bedtime

## 2017-12-17 NOTE — Assessment & Plan Note (Signed)
Reviewed health habits including diet and exercise and skin cancer prevention Reviewed appropriate screening tests for age  Also reviewed health mt list, fam hx and immunization status , as well as social and family history   See HPI Labs reviewed  Disc stressors and mood-continues counseling Update PNA 23 vaccine today  Disc plan to get on wait list for Shingrix

## 2017-12-17 NOTE — Assessment & Plan Note (Signed)
Takes ppi in am  Wakes up with some epigastric burning  suggestt 150 mg ranitidine at bedtime Will update if no imp  Also diet adv / not eating late

## 2017-12-17 NOTE — Progress Notes (Signed)
   Subjective:    Patient ID: Patty Williams, female    DOB: [birth year], 57 y.o.   MRN: [id number]  HPI Here for health maintenance exam and to review chronic medical problems  Doing ok/hanging in there  Stress with family  Mother passed in sept  Interfered with her self care   She is seeing a therapist for stress/overeating and other issues    Starting back with self care   Wt Readings from Last 3 Encounters:  12/17/17 220 lb 8 oz (100 kg)  12/11/16 206 lb 12.8 oz (93.8 kg)  11/01/15 194 lb 12 oz (88.3 kg)  back in the gym  Wants to see a nutritionist (found one in [place])  36.69 kg/m   Flu shot -gets every fall   PNA 23 was given in 2012  Will get this updated today   Asthma is controlled on current medication   Mammogram 9/18 -nl  Self breast exam -no lumps   Colonoscopy 9/10 with 10 y recall   Pap 11/17  Sees gyn - last December (breast and pelvic exam)   Tetanus shot 12/12  Zoster status - wants to get on a wait list for the shingrix vaccine   dexa 9/17 - normal BMD No falls or fractures  Taking vitamin D   bp is stable today  No cp or palpitations or headaches or edema  No side effects to medicines  BP Readings from Last 3 Encounters:  12/17/17 128/86  12/11/16 122/82  11/01/15 124/84    Is higher at work    Lab Results  Component Value Date   CREATININE 0.63 12/10/2017   BUN 15 12/10/2017   NA 140 12/10/2017   K 4.0 12/10/2017   CL 107 12/10/2017   CO2 24 12/10/2017   Lab Results  Component Value Date   ALT 27 12/10/2017   AST 17 12/10/2017   ALKPHOS 56 12/10/2017   BILITOT 0.3 12/10/2017   Lab Results  Component Value Date   WBC 6.7 12/10/2017   HGB 12.6 12/10/2017   HCT 38.1 12/10/2017   MCV 89.7 12/10/2017   PLT 267.0 12/10/2017   Lab Results  Component Value Date   TSH 1.86 12/10/2017     Prediabetes Lab Results  Component Value Date   HGBA1C 6.1 12/10/2017  down from 6.2   Hyperlipidemia Lab Results    Component Value Date   CHOL 154 12/10/2017   CHOL 154 12/10/2016   CHOL 167 10/11/2015   Lab Results  Component Value Date   HDL 47.80 12/10/2017   HDL 47.20 12/10/2016   HDL 44.80 10/11/2015   Lab Results  Component Value Date   LDLCALC 95 12/10/2017   LDLCALC 96 12/10/2016   LDLCALC 110 (H) 10/11/2015   Lab Results  Component Value Date   TRIG 54.0 12/10/2017   TRIG 54.0 12/10/2016   TRIG 64.0 10/11/2015   Lab Results  Component Value Date   CHOLHDL 3 12/10/2017   CHOLHDL 3 12/10/2016   CHOLHDL 4 10/11/2015   Lab Results  Component Value Date   LDLDIRECT 144.2 09/21/2008  stable         Review of Systems     Objective:   Physical Exam        Assessment & Plan:

## 2017-12-17 NOTE — Assessment & Plan Note (Signed)
Discussed how this problem influences overall health and the risks it imposes  Reviewed plan for weight loss with lower calorie diet (via better food choices and also portion control or program like weight watchers) and exercise building up to or more than 30 minutes 5 days per week including some aerobic activity   Pt plans to see a nutritionist in [place] Given px /order for this

## 2017-12-17 NOTE — Assessment & Plan Note (Signed)
Disc goals for lipids and reasons to control them Rev last labs with pt Rev low sat fat diet in detail Stable with diet control

## 2018-02-14 ENCOUNTER — Encounter: Payer: Self-pay | Admitting: Family Medicine

## 2018-05-19 ENCOUNTER — Ambulatory Visit (INDEPENDENT_AMBULATORY_CARE_PROVIDER_SITE_OTHER): Payer: 59 | Admitting: Family Medicine

## 2018-05-19 ENCOUNTER — Encounter: Payer: Self-pay | Admitting: Family Medicine

## 2018-05-19 VITALS — BP 132/84 | HR 72 | Temp 98.3°F | Ht 65.0 in | Wt 219.5 lb

## 2018-05-19 DIAGNOSIS — Z6836 Body mass index (BMI) 36.0-36.9, adult: Secondary | ICD-10-CM

## 2018-05-19 DIAGNOSIS — J069 Acute upper respiratory infection, unspecified: Secondary | ICD-10-CM

## 2018-05-19 MED ORDER — ALBUTEROL SULFATE HFA 108 (90 BASE) MCG/ACT IN AERS: 2.0000 | INHALATION_SPRAY | RESPIRATORY_TRACT | 1 refills | Status: DC | PRN

## 2018-05-19 MED ORDER — BENZONATATE 200 MG PO CAPS: 200.0000 mg | ORAL_CAPSULE | Freq: Three times a day (TID) | ORAL | 1 refills | Status: DC | PRN

## 2018-05-19 MED ORDER — GUAIFENESIN-CODEINE 100-10 MG/5ML PO SYRP: 5.0000 mL | ORAL_SOLUTION | Freq: Four times a day (QID) | ORAL | 0 refills | Status: DC | PRN

## 2018-05-19 MED ORDER — BUDESONIDE-FORMOTEROL FUMARATE 80-4.5 MCG/ACT IN AERO: INHALATION_SPRAY | RESPIRATORY_TRACT | 11 refills | Status: DC

## 2018-05-19 NOTE — Patient Instructions (Signed)
Drink fluids and rest  Continue symbicort and rescue inhaler   Tessalon for cough  Expectorant plus DM for day Robitussin with codeine for night   Watch for worse symptoms/fever/wheezing  Update if not starting to improve in a week or if worsening

## 2018-05-19 NOTE — Assessment & Plan Note (Signed)
Discussed how this problem influences overall health and the risks it imposes  Reviewed plan for weight loss with lower calorie diet (via better food choices and also portion control or program like weight watchers) and exercise building up to or more than 30 minutes 5 days per week including some aerobic activity   When feeling better -pt plans to get back to walking for exercise

## 2018-05-19 NOTE — Assessment & Plan Note (Signed)
With persistent sometimes severe cough that make it hard to catch her breath  No wheeze however Very reassuring exam  Low threshold to cover with abx if worse or no improvement  Will tx cough aggressively  Disc symptomatic care - see instructions on AVS  AVS: Drink fluids and rest  Continue symbicort and rescue inhaler   Tessalon for cough  Expectorant plus DM for day Robitussin with codeine for night   Watch for worse symptoms/fever/wheezing  Update if not starting to improve in a week or if worsening

## 2018-05-19 NOTE — Progress Notes (Signed)
Subjective:    Patient ID: Patty Williams, female    DOB: 04-28-1961, 58 y.o.   MRN: [id number]  HPI Here for cough and nasal congestion   Thinks this is 2nd cold in a row (or got better and then worse)  Low grade temp Sunday Then Monday- prod cough  She inc her symbicort to bid -this helps  Very deep bronchial cough   Had a bad cough spell yesterday and could not stop  Her partner became concerned  Prod of mucous -not much   Using her rescue inhaler  Not wheezing   No longer has fever   Started with a lot of nasal congestion and runny nose  That has slowed down   Patient Active Problem List   Diagnosis Date Noted  . Upper respiratory infection 05/19/2018  . Obesity 10/20/2015  . Current use of proton pump inhibitor 10/18/2015  . History of fracture 10/18/2015  . Left knee pain 04/19/2015  . Frequent UTI 10/06/2013  . Seasonal and perennial allergic rhinitis 01/01/2013  . Routine general medical examination at a health care facility 04/30/2011  . Prediabetes   . Plantar fasciitis   . METATARSALGIA 04/11/2010  . 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 Obstructive sleep apnea 06/10/2009  . GERD 11/09/2008  . Hyperlipidemia 07/20/2008  . Essential hypertension, benign 03/02/2008  . ASTHMA 03/29/2007  . ENDOMETRIOSIS 03/29/2007   Past Medical History:  Diagnosis Date  . ADD (attention deficit disorder) 12/2006   adult  . Asthma   . Diabetes mellitus type II    mild  . Elevated blood pressure reading without diagnosis of hypertension   . Endometriosis   . GERD (gastroesophageal reflux disease)   . HLD (hyperlipidemia)   . Obesity   . OSA on CPAP   . Plantar fasciitis    with orthotics-much difficulty adjusting these  . Stress reaction    Past Surgical History:  Procedure Laterality Date  . HEMORRHOID SURGERY    . LAPAROSCOPY     endometriosis  . UTERINE SUSPENSION    . WRIST FRACTURE SURGERY     Social History   Tobacco Use  . Smoking  status: Never Smoker  . Smokeless tobacco: Never Used  Substance Use Topics  . Alcohol use: Yes    Alcohol/week: 0.0 standard drinks    Comment: social  . Drug use: No   Family History  Problem Relation Age of Onset  . Cancer Father        prostate  . Thyroid disease Mother        ?  . Diabetes Mother   . Hypertension Mother   . Heart attack Other        grandfather (in his 46's)  . Parkinsonism Other        grandfater   No Known Allergies Current Outpatient Medications on File Prior to Visit  Medication Sig Dispense Refill  . hydrocortisone (ANUSOL-HC) 25 MG suppository Place 1 suppository (25 mg total) rectally 2 (two) times daily. As needed for hemorrhoid 12 suppository 1  . losartan (COZAAR) 50 MG tablet Take 0.5 tablets (25 mg total) by mouth daily. 15 tablet 11  . Magnesium 400 MG CAPS Take 400 mg by mouth.    . Multiple Vitamin (MULTIVITAMIN) tablet Take 1 tablet by mouth daily.      . NONFORMULARY OR COMPOUNDED ITEM Exercise prescription:  Personal training sessions for patient to strengthen quadriceps musculature and core stability for knee pain and knee stability. Dx: L87.56,  M23.92 1 each 0  . Omega-3 Fatty Acids (FISH OIL) 1000 MG CAPS Take 2,000 mg by mouth daily.    Marland Kitchen omeprazole (PRILOSEC) 20 MG capsule Take 2 capsules (40 mg total) by mouth daily. 60 capsule 11   No current facility-administered medications on file prior to visit.     Review of Systems  Constitutional: Positive for appetite change and fatigue. Negative for fever.       Low grade temp w/o fever   HENT: Positive for congestion, postnasal drip, rhinorrhea, sinus pressure, sneezing and sore throat. Negative for ear pain.   Eyes: Negative for pain and discharge.  Respiratory: Positive for cough. Negative for chest tightness, shortness of breath, wheezing and stridor.   Cardiovascular: Negative for chest pain.  Gastrointestinal: Negative for diarrhea, nausea and vomiting.  Genitourinary: Negative  for frequency, hematuria and urgency.  Musculoskeletal: Negative for arthralgias and myalgias.  Skin: Negative for rash.  Neurological: Positive for headaches. Negative for dizziness, weakness and light-headedness.  Psychiatric/Behavioral: Negative for confusion and dysphoric mood.       Objective:   Physical Exam Constitutional:      General: She is not in acute distress.    Appearance: Normal appearance. She is well-developed. She is obese. She is not ill-appearing, toxic-appearing or diaphoretic.  HENT:     Head: Normocephalic and atraumatic.     Comments: Nares are injected and congested      Right Ear: Tympanic membrane, ear canal and external ear normal.     Left Ear: Tympanic membrane, ear canal and external ear normal.     Nose: Congestion and rhinorrhea present.     Mouth/Throat:     Mouth: Mucous membranes are moist.     Pharynx: Oropharynx is clear. No oropharyngeal exudate or posterior oropharyngeal erythema.     Comments: Clear pnd  Eyes:     General:        Right eye: No discharge.        Left eye: No discharge.     Conjunctiva/sclera: Conjunctivae normal.     Pupils: Pupils are equal, round, and reactive to light.  Neck:     Musculoskeletal: Normal range of motion and neck supple.  Cardiovascular:     Rate and Rhythm: Normal rate.     Heart sounds: Normal heart sounds.  Pulmonary:     Effort: Pulmonary effort is normal. No respiratory distress.     Breath sounds: Normal breath sounds. No stridor. No wheezing, rhonchi or rales.     Comments: Good air exch No wheeze expect for forced expiration  Harsh cough  Upper airway sounds No rales or rhonchi  Chest:     Chest wall: No tenderness.  Lymphadenopathy:     Cervical: No cervical adenopathy.  Skin:    General: Skin is warm and dry.     Capillary Refill: Capillary refill takes less than 2 seconds.     Findings: No rash.  Neurological:     Mental Status: She is alert.     Cranial Nerves: No cranial nerve  deficit.  Psychiatric:        Mood and Affect: Mood normal.           Assessment & Plan:   Problem List Items Addressed This Visit      Respiratory   Upper respiratory infection - Primary    With persistent sometimes severe cough that make it hard to catch her breath  No wheeze however Very reassuring exam  Low threshold  to cover with abx if worse or no improvement  Will tx cough aggressively  Disc symptomatic care - see instructions on AVS  AVS: Drink fluids and rest  Continue symbicort and rescue inhaler   Tessalon for cough  Expectorant plus DM for day Robitussin with codeine for night   Watch for worse symptoms/fever/wheezing  Update if not starting to improve in a week or if worsening          Other   Class 2 severe obesity due to excess calories with serious comorbidity and body mass index (BMI) of 36.0 to 36.9 in adult [facility])    Discussed how this problem influences overall health and the risks it imposes  Reviewed plan for weight loss with lower calorie diet (via better food choices and also portion control or program like weight watchers) and exercise building up to or more than 30 minutes 5 days per week including some aerobic activity   When feeling better -pt plans to get back to walking for exercise

## 2018-05-20 ENCOUNTER — Ambulatory Visit: Payer: 59 | Admitting: Family Medicine

## 2018-08-06 ENCOUNTER — Encounter: Payer: Self-pay | Admitting: Family Medicine

## 2018-08-08 ENCOUNTER — Encounter: Payer: Self-pay | Admitting: Family Medicine

## 2018-08-08 MED ORDER — LOSARTAN POTASSIUM 50 MG PO TABS
25.0000 mg | ORAL_TABLET | Freq: Every day | ORAL | 11 refills | Status: DC
Start: 2018-08-08 — End: 2018-08-26

## 2018-08-25 ENCOUNTER — Telehealth: Payer: Self-pay | Admitting: Family Medicine

## 2018-08-25 NOTE — Telephone Encounter (Signed)
Patient called and stated that her Losartan dosage had been increased but her current prescription at her pharmacy,  [facility], [place] [address] Phone: [phone] Fax: [phone] Please send new dosage for patient.

## 2018-08-26 MED ORDER — LOSARTAN POTASSIUM 50 MG PO TABS: 50.0000 mg | ORAL_TABLET | Freq: Every day | ORAL | 11 refills | Status: DC

## 2018-08-26 NOTE — Addendum Note (Signed)
Addended by: Loura Pardon A on: 08/26/2018 09:21 AM   Modules accepted: Orders

## 2018-08-26 NOTE — Telephone Encounter (Signed)
Will route to PCP because i'm not sure what does pt is suppose to be taking but it looks like Dr. Glori Bickers did Rx on 08/08/18 with instructions to take 1/2 tab daily

## 2018-09-14 ENCOUNTER — Other Ambulatory Visit: Payer: Self-pay | Admitting: Obstetrics

## 2018-09-15 ENCOUNTER — Encounter (HOSPITAL_BASED_OUTPATIENT_CLINIC_OR_DEPARTMENT_OTHER): Payer: Self-pay

## 2018-09-15 ENCOUNTER — Other Ambulatory Visit: Payer: Self-pay | Admitting: Obstetrics

## 2018-09-16 ENCOUNTER — Encounter (HOSPITAL_BASED_OUTPATIENT_CLINIC_OR_DEPARTMENT_OTHER): Payer: Self-pay

## 2018-09-16 ENCOUNTER — Other Ambulatory Visit: Payer: Self-pay

## 2018-09-16 NOTE — Progress Notes (Signed)
SPOKE W/  Patty Williams     SCREENING SYMPTOMS OF COVID 19:   COUGH--NO  RUNNY NOSE--- NO  SORE THROAT---NO  NASAL CONGESTION----NO  SNEEZING----NO  SHORTNESS OF BREATH---NO  DIFFICULTY BREATHING---NO  TEMP >100.0 -----NO  UNEXPLAINED BODY ACHES------NO  CHILLS -------- NO  HEADACHES ---------NO  LOSS OF SMELL/ TASTE --------NO    HAVE YOU OR ANY FAMILY MEMBER TRAVELLED PAST 14 DAYS OUT OF THE   COUNTY--- [place]IVE IN [place]----NO COUNTRY----NO  HAVE YOU OR ANY FAMILY MEMBER BEEN EXPOSED TO ANYONE WITH COVID 19?  NO

## 2018-09-16 NOTE — Progress Notes (Signed)
Spoke with: Patty Williams NPO:  After Midnight, no gum, candy, or mints   Arrival time: 1000AM Labs: (COVID, CBC, BMP, T&S, EKG 09/19/2018 in epic) AM medications: Symbicort, Omeprazole, Bring Inhaler and CPAP Pre op orders: Yes Ride home:  Patty Williams (spouse) [phone]

## 2018-09-19 ENCOUNTER — Other Ambulatory Visit: Payer: Self-pay

## 2018-09-19 ENCOUNTER — Other Ambulatory Visit (HOSPITAL_COMMUNITY)
Admission: RE | Admit: 2018-09-19 | Discharge: 2018-09-19 | Disposition: A | Discharge status: Home / Self Care | Payer: 59 | Source: Ambulatory Visit | Attending: Obstetrics | Admitting: Obstetrics

## 2018-09-19 ENCOUNTER — Encounter (HOSPITAL_COMMUNITY)
Admission: RE | Admit: 2018-09-19 | Discharge: 2018-09-19 | Disposition: A | Discharge status: Home / Self Care | Payer: 59 | Source: Ambulatory Visit | Attending: Obstetrics | Admitting: Obstetrics

## 2018-09-19 DIAGNOSIS — J45909 Unspecified asthma, uncomplicated: Secondary | ICD-10-CM | POA: Diagnosis not present

## 2018-09-19 DIAGNOSIS — D259 Leiomyoma of uterus, unspecified: Secondary | ICD-10-CM | POA: Diagnosis present

## 2018-09-19 DIAGNOSIS — N950 Postmenopausal bleeding: Secondary | ICD-10-CM | POA: Diagnosis not present

## 2018-09-19 DIAGNOSIS — I10 Essential (primary) hypertension: Secondary | ICD-10-CM | POA: Diagnosis not present

## 2018-09-19 DIAGNOSIS — E669 Obesity, unspecified: Secondary | ICD-10-CM | POA: Diagnosis not present

## 2018-09-19 DIAGNOSIS — K219 Gastro-esophageal reflux disease without esophagitis: Secondary | ICD-10-CM | POA: Diagnosis not present

## 2018-09-19 DIAGNOSIS — Y92530 Ambulatory surgery center as the place of occurrence of the external cause: Secondary | ICD-10-CM | POA: Diagnosis not present

## 2018-09-19 DIAGNOSIS — D649 Anemia, unspecified: Secondary | ICD-10-CM | POA: Diagnosis not present

## 2018-09-19 DIAGNOSIS — I517 Cardiomegaly: Secondary | ICD-10-CM | POA: Insufficient documentation

## 2018-09-19 DIAGNOSIS — Z01818 Encounter for other preprocedural examination: Secondary | ICD-10-CM | POA: Insufficient documentation

## 2018-09-19 DIAGNOSIS — Z79899 Other long term (current) drug therapy: Secondary | ICD-10-CM | POA: Diagnosis not present

## 2018-09-19 DIAGNOSIS — E119 Type 2 diabetes mellitus without complications: Secondary | ICD-10-CM | POA: Diagnosis not present

## 2018-09-19 DIAGNOSIS — Z885 Allergy status to narcotic agent status: Secondary | ICD-10-CM | POA: Diagnosis not present

## 2018-09-19 DIAGNOSIS — D250 Submucous leiomyoma of uterus: Secondary | ICD-10-CM | POA: Diagnosis not present

## 2018-09-19 DIAGNOSIS — E785 Hyperlipidemia, unspecified: Secondary | ICD-10-CM | POA: Diagnosis not present

## 2018-09-19 DIAGNOSIS — Z793 Long term (current) use of hormonal contraceptives: Secondary | ICD-10-CM | POA: Diagnosis not present

## 2018-09-19 DIAGNOSIS — G4733 Obstructive sleep apnea (adult) (pediatric): Secondary | ICD-10-CM | POA: Diagnosis not present

## 2018-09-19 DIAGNOSIS — Z1159 Encounter for screening for other viral diseases: Secondary | ICD-10-CM | POA: Diagnosis not present

## 2018-09-19 DIAGNOSIS — Y838 Other surgical procedures as the cause of abnormal reaction of the patient, or of later complication, without mention of misadventure at the time of the procedure: Secondary | ICD-10-CM | POA: Diagnosis not present

## 2018-09-19 DIAGNOSIS — N9971 Accidental puncture and laceration of a genitourinary system organ or structure during a genitourinary system procedure: Secondary | ICD-10-CM | POA: Diagnosis not present

## 2018-09-19 LAB — BASIC METABOLIC PANEL
Anion gap: 10 (ref 5–15)
BUN: 20 mg/dL (ref 6–20)
CO2: 22 mmol/L (ref 22–32)
Calcium: 8.9 mg/dL (ref 8.9–10.3)
Chloride: 108 mmol/L (ref 98–111)
Creatinine, Ser: 0.61 mg/dL (ref 0.44–1.00)
GFR calc Af Amer: >60 mL/min (ref >60)
GFR calc non Af Amer: >60 mL/min (ref >60)
Glucose, Bld: 118 mg/dL — ABNORMAL HIGH (ref 70–99)
Potassium: 4.1 mmol/L (ref 3.5–5.1)
Sodium: 140 mmol/L (ref 135–145)

## 2018-09-19 LAB — CBC
HCT: 40.5 % (ref 36.0–46.0)
Hemoglobin: 12.8 g/dL (ref 12.0–15.0)
MCH: 28.9 pg (ref 26.0–34.0)
MCHC: 31.6 g/dL (ref 30.0–36.0)
MCV: 91.4 fL (ref 80.0–100.0)
Platelets: 393 10*3/uL (ref 150–400)
RBC: 4.43 MIL/uL (ref 3.87–5.11)
RDW: 14.7 % (ref 11.5–15.5)
WBC: 7.4 10*3/uL (ref 4.0–10.5)
nRBC: 0 % (ref 0.0–0.2)

## 2018-09-19 LAB — ABO/RH: ABO/RH(D): O POS

## 2018-09-19 NOTE — Progress Notes (Signed)
Called dr Pamala Hurry office and spoke w/ or scheduler, shanelle. Inquired where dr Tiburcio Bash is today and that pt is here for lab work including covid test, which needs second sign asap. [place] stated that dr Pamala Hurry is in office seeding pts, she will get dr Tiburcio Bash to sign order.

## 2018-09-20 LAB — NOVEL CORONAVIRUS, NAA (HOSP ORDER, SEND-OUT TO REF LAB; TAT 18-24 HRS): SARS-CoV-2, NAA: NOT DETECTED

## 2018-09-20 NOTE — Progress Notes (Signed)
Final EKG dated 09-19-2018 in epic .

## 2018-09-21 NOTE — Progress Notes (Signed)
SPOKE W/  Patty Williams     SCREENING SYMPTOMS OF COVID 19:   COUGH no  RUNNY NOSE no  SORE THROAT no  NASAL CONGESTION no  SNEEZING no-  SHORTNESS OF BREATH no  DIFFICULTY BREATHING no  TEMP >100.0 no  UNEXPLAINED BODY ACHES no  CHILLS no   HEADACHES no  LOSS OF SMELL/ TASTE no    HAVE YOU OR ANY FAMILY MEMBER TRAVELLED PAST 14 DAYS OUT OF THE   COUNTY no STATE no COUNTRY no  HAVE YOU OR ANY FAMILY MEMBER BEEN EXPOSED TO ANYONE WITH COVID 19? no

## 2018-09-21 NOTE — H&P (Signed)
CC: post-menopausal bleeding, submucosal fibroid  HPI: 58 yo menopausal pt with new onset PMB. Pt had remained on low dose OCs, then switched to high dose HRT through perimenopause to avoid complications of stage IV endometriosis (surgically staged, known bowel adhesions and rectovaginal fistula). Given high FSH, pt stopped HRT 12/19, Began bleeding 1/20 and u/s revealed fibroid uterus but overall thin EMS and no ebx was done. U/s with uterus 10x8x6 cm, 22mm EMS, multiple fibroids, largest 4x3.5x3 displacing endometrium anteriorly. Bleeding again resumed 4/20 and sonohystogram showed 1.5cm fundal cavitary SM fibroid. Myomectomy planned and pt started on aygestin until surgery could be schedule. Aygestin improves bleeding. Pt w/o symptoms of anemia.   PMH: obesity, htn, endoemetriosis, rectovaginal fistula  Past Medical History:  Diagnosis Date  . ADD (attention deficit disorder) 12/2006   adult, pt denies  . Asthma   . Diabetes mellitus type II    mild  . Diverticulosis 2014   Mild  . Elevated blood pressure reading without diagnosis of hypertension   . Endometriosis    Severe  . Fibroids   . GERD (gastroesophageal reflux disease)   . History of ovarian cyst   . HLD (hyperlipidemia)   . Hypertension   . Iron deficiency anemia   . Obesity   . Obesity   . OSA on CPAP   . Plantar fasciitis    with orthotics-much difficulty adjusting these  . PMB (postmenopausal bleeding)   . Pneumonia    history of  . PONV (postoperative nausea and vomiting)   . Pre-diabetes   . Rectovaginal fistula   . Stress reaction   . Wears glasses     Past Surgical History:  Procedure Laterality Date  . COLONOSCOPY  01/2009  . HEMORRHOID SURGERY    . LAPAROSCOPY     endometriosis  . UPPER GI ENDOSCOPY  01/2009  . UTERINE SUSPENSION      All: morphine causes hallucinations  Meds: aygestin, Losartan, omeprazole SH: dentist  PE: Vitals:   09/16/18 0932 09/22/18 1007  BP:  (!) 152/98  Pulse:  77   Resp:  16  Temp:  98.6 F (37 C)  TempSrc:  Oral  SpO2:  99%  Weight: 95.3 kg 96.3 kg  Height: 5\' 5"  (1.651 m) 5\' 5"  (1.651 m)    Gen: well appearing, pleasant, no distress Abd: obese, NT Gu: def to OR LE: NT, no edema  CBC    Component Value Date/Time   WBC 7.4 09/19/2018 0937   RBC 4.43 09/19/2018 0937   HGB 12.8 09/19/2018 0937   HCT 40.5 09/19/2018 0937   PLT 393 09/19/2018 0937   MCV 91.4 09/19/2018 0937   MCH 28.9 09/19/2018 0937   MCHC 31.6 09/19/2018 0937   RDW 14.7 09/19/2018 0937   LYMPHSABS 2.2 12/10/2017 0806   MONOABS 0.6 12/10/2017 0806   EOSABS 0.1 12/10/2017 0806   BASOSABS 0.0 12/10/2017 0806     A/P: 58 yo post-menopausal pt with bleeding due to fibroid. Plan hysteroscopic myomectomy. Pt aware risks bleeding, infection, damage to surrounding organs. If needing open surgey, expect adhesions and will plan general surgery assistance (based on prior op notes and recommendations) though do not expect to need open surgery.   Ala Dach 09/22/2018 11:40 AM

## 2018-09-22 ENCOUNTER — Observation Stay (HOSPITAL_BASED_OUTPATIENT_CLINIC_OR_DEPARTMENT_OTHER)
Admission: RE | Admit: 2018-09-22 | Discharge: 2018-09-23 | Disposition: A | Discharge status: Home / Self Care | Payer: 59 | Attending: Obstetrics | Admitting: Obstetrics

## 2018-09-22 ENCOUNTER — Ambulatory Visit (HOSPITAL_BASED_OUTPATIENT_CLINIC_OR_DEPARTMENT_OTHER): Payer: 59 | Admitting: Physician Assistant

## 2018-09-22 ENCOUNTER — Other Ambulatory Visit: Payer: Self-pay

## 2018-09-22 ENCOUNTER — Encounter (HOSPITAL_BASED_OUTPATIENT_CLINIC_OR_DEPARTMENT_OTHER): Payer: Self-pay

## 2018-09-22 ENCOUNTER — Ambulatory Visit (HOSPITAL_BASED_OUTPATIENT_CLINIC_OR_DEPARTMENT_OTHER): Payer: 59 | Admitting: Anesthesiology

## 2018-09-22 ENCOUNTER — Encounter (HOSPITAL_COMMUNITY)
Admission: RE | Disposition: A | Discharge status: Home / Self Care | Payer: Self-pay | Source: Home / Self Care | Attending: Obstetrics

## 2018-09-22 DIAGNOSIS — J45909 Unspecified asthma, uncomplicated: Secondary | ICD-10-CM | POA: Insufficient documentation

## 2018-09-22 DIAGNOSIS — Z1159 Encounter for screening for other viral diseases: Secondary | ICD-10-CM | POA: Insufficient documentation

## 2018-09-22 DIAGNOSIS — D250 Submucous leiomyoma of uterus: Principal | ICD-10-CM | POA: Insufficient documentation

## 2018-09-22 DIAGNOSIS — Z793 Long term (current) use of hormonal contraceptives: Secondary | ICD-10-CM | POA: Insufficient documentation

## 2018-09-22 DIAGNOSIS — N939 Abnormal uterine and vaginal bleeding, unspecified: Secondary | ICD-10-CM | POA: Diagnosis present

## 2018-09-22 DIAGNOSIS — N950 Postmenopausal bleeding: Secondary | ICD-10-CM | POA: Insufficient documentation

## 2018-09-22 DIAGNOSIS — Y838 Other surgical procedures as the cause of abnormal reaction of the patient, or of later complication, without mention of misadventure at the time of the procedure: Secondary | ICD-10-CM | POA: Insufficient documentation

## 2018-09-22 DIAGNOSIS — E669 Obesity, unspecified: Secondary | ICD-10-CM | POA: Insufficient documentation

## 2018-09-22 DIAGNOSIS — I10 Essential (primary) hypertension: Secondary | ICD-10-CM | POA: Insufficient documentation

## 2018-09-22 DIAGNOSIS — Z79899 Other long term (current) drug therapy: Secondary | ICD-10-CM | POA: Insufficient documentation

## 2018-09-22 DIAGNOSIS — K219 Gastro-esophageal reflux disease without esophagitis: Secondary | ICD-10-CM | POA: Insufficient documentation

## 2018-09-22 DIAGNOSIS — E119 Type 2 diabetes mellitus without complications: Secondary | ICD-10-CM | POA: Insufficient documentation

## 2018-09-22 DIAGNOSIS — G4733 Obstructive sleep apnea (adult) (pediatric): Secondary | ICD-10-CM | POA: Insufficient documentation

## 2018-09-22 DIAGNOSIS — Y92530 Ambulatory surgery center as the place of occurrence of the external cause: Secondary | ICD-10-CM | POA: Insufficient documentation

## 2018-09-22 DIAGNOSIS — N9971 Accidental puncture and laceration of a genitourinary system organ or structure during a genitourinary system procedure: Secondary | ICD-10-CM | POA: Insufficient documentation

## 2018-09-22 DIAGNOSIS — Z885 Allergy status to narcotic agent status: Secondary | ICD-10-CM | POA: Insufficient documentation

## 2018-09-22 DIAGNOSIS — E785 Hyperlipidemia, unspecified: Secondary | ICD-10-CM | POA: Insufficient documentation

## 2018-09-22 DIAGNOSIS — D649 Anemia, unspecified: Secondary | ICD-10-CM | POA: Insufficient documentation

## 2018-09-22 HISTORY — DX: Postmenopausal bleeding: N95.0

## 2018-09-22 HISTORY — DX: Nausea with vomiting, unspecified: R11.2

## 2018-09-22 HISTORY — DX: Pneumonia, unspecified organism: J18.9

## 2018-09-22 HISTORY — DX: Essential (primary) hypertension: I10

## 2018-09-22 HISTORY — PX: DILATATION & CURETTAGE/HYSTEROSCOPY WITH MYOSURE: SHX6511

## 2018-09-22 HISTORY — DX: Presence of spectacles and contact lenses: Z97.3

## 2018-09-22 HISTORY — DX: Iron deficiency anemia, unspecified: D50.9

## 2018-09-22 HISTORY — DX: Diverticulosis of intestine, part unspecified, without perforation or abscess without bleeding: K57.90

## 2018-09-22 HISTORY — DX: Fistula of vagina to large intestine: N82.3

## 2018-09-22 HISTORY — DX: Personal history of other diseases of the female genital tract: Z87.42

## 2018-09-22 HISTORY — DX: Prediabetes: R73.03

## 2018-09-22 HISTORY — DX: Other specified postprocedural states: Z98.890

## 2018-09-22 HISTORY — DX: Benign neoplasm of connective and other soft tissue, unspecified: D21.9

## 2018-09-22 LAB — TYPE AND SCREEN
ABO/RH(D): O POS
Antibody Screen: NEGATIVE

## 2018-09-22 LAB — CBC
HCT: 40.1 % (ref 36.0–46.0)
Hemoglobin: 12.6 g/dL (ref 12.0–15.0)
MCH: 28.5 pg (ref 26.0–34.0)
MCHC: 31.4 g/dL (ref 30.0–36.0)
MCV: 90.7 fL (ref 80.0–100.0)
Platelets: 357 10*3/uL (ref 150–400)
RBC: 4.42 MIL/uL (ref 3.87–5.11)
RDW: 14.7 % (ref 11.5–15.5)
WBC: 10.3 10*3/uL (ref 4.0–10.5)
nRBC: 0 % (ref 0.0–0.2)

## 2018-09-22 SURGERY — DILATATION & CURETTAGE/HYSTEROSCOPY WITH MYOSURE
Anesthesia: General

## 2018-09-22 MED ORDER — OXYCODONE-ACETAMINOPHEN 5-325 MG PO TABS: 1.0000 | ORAL_TABLET | ORAL | Status: DC | PRN

## 2018-09-22 MED ORDER — ACETAMINOPHEN 10 MG/ML IV SOLN
1000.0000 mg | Freq: Once | INTRAVENOUS | Status: DC | PRN
  Filled 2018-09-22: qty 100

## 2018-09-22 MED ORDER — ONDANSETRON HCL 4 MG/2ML IJ SOLN: 4.0000 mg | Freq: Four times a day (QID) | INTRAMUSCULAR | Status: DC | PRN

## 2018-09-22 MED ORDER — SILVER NITRATE-POT NITRATE 75-25 % EX MISC
CUTANEOUS | Status: DC | PRN
  Administered 2018-09-22: 2

## 2018-09-22 MED ORDER — DEXAMETHASONE SODIUM PHOSPHATE 10 MG/ML IJ SOLN
INTRAMUSCULAR | Status: DC | PRN
  Administered 2018-09-22: 10 mg via INTRAVENOUS

## 2018-09-22 MED ORDER — IBUPROFEN 800 MG PO TABS
800.0000 mg | ORAL_TABLET | Freq: Three times a day (TID) | ORAL | Status: DC | PRN
  Administered 2018-09-22 – 2018-09-23 (×2): 800 mg via ORAL
  Filled 2018-09-22 (×2): qty 1

## 2018-09-22 MED ORDER — MENTHOL 3 MG MT LOZG: 1.0000 | LOZENGE | OROMUCOSAL | Status: DC | PRN

## 2018-09-22 MED ORDER — PANTOPRAZOLE SODIUM 40 MG PO TBEC: 40.0000 mg | DELAYED_RELEASE_TABLET | Freq: Every day | ORAL | Status: DC

## 2018-09-22 MED ORDER — FENTANYL CITRATE (PF) 100 MCG/2ML IJ SOLN
INTRAMUSCULAR | Status: DC | PRN
  Administered 2018-09-22: 100 ug via INTRAVENOUS

## 2018-09-22 MED ORDER — HYDROMORPHONE HCL 1 MG/ML IJ SOLN: 0.2000 mg | INTRAMUSCULAR | Status: DC | PRN

## 2018-09-22 MED ORDER — PROMETHAZINE HCL 25 MG/ML IJ SOLN
6.2500 mg | INTRAMUSCULAR | Status: DC | PRN
  Filled 2018-09-22: qty 1

## 2018-09-22 MED ORDER — ACETAMINOPHEN 500 MG PO TABS
1000.0000 mg | ORAL_TABLET | Freq: Four times a day (QID) | ORAL | Status: DC | PRN
  Administered 2018-09-22 – 2018-09-23 (×2): 1000 mg via ORAL
  Filled 2018-09-22 (×2): qty 2

## 2018-09-22 MED ORDER — BUPIVACAINE HCL 0.5 % IJ SOLN
INTRAMUSCULAR | Status: DC | PRN
  Administered 2018-09-22: 20 mL

## 2018-09-22 MED ORDER — LACTATED RINGERS IV SOLN
INTRAVENOUS | Status: DC
  Filled 2018-09-22: qty 1000

## 2018-09-22 MED ORDER — KETOROLAC TROMETHAMINE 30 MG/ML IJ SOLN
30.0000 mg | Freq: Once | INTRAMUSCULAR | Status: AC
  Administered 2018-09-22: 13:00:00 15 mg via INTRAVENOUS
  Filled 2018-09-22: qty 1

## 2018-09-22 MED ORDER — ACETAMINOPHEN 160 MG/5ML PO SOLN
325.0000 mg | Freq: Once | ORAL | Status: DC | PRN
  Filled 2018-09-22: qty 20.3

## 2018-09-22 MED ORDER — SIMETHICONE 80 MG PO CHEW: 80.0000 mg | CHEWABLE_TABLET | Freq: Four times a day (QID) | ORAL | Status: DC | PRN

## 2018-09-22 MED ORDER — SODIUM CHLORIDE 0.9 % IV SOLN
INTRAVENOUS | Status: DC
  Administered 2018-09-22 (×2): via INTRAVENOUS

## 2018-09-22 MED ORDER — MIDAZOLAM HCL 2 MG/2ML IJ SOLN
INTRAMUSCULAR | Status: AC
  Filled 2018-09-22: qty 2

## 2018-09-22 MED ORDER — SCOPOLAMINE 1 MG/3DAYS TD PT72
MEDICATED_PATCH | TRANSDERMAL | Status: AC
  Filled 2018-09-22: qty 1

## 2018-09-22 MED ORDER — PROPOFOL 10 MG/ML IV BOLUS
INTRAVENOUS | Status: AC
  Filled 2018-09-22: qty 20

## 2018-09-22 MED ORDER — LIDOCAINE 2% (20 MG/ML) 5 ML SYRINGE
INTRAMUSCULAR | Status: DC | PRN
  Administered 2018-09-22: 60 mg via INTRAVENOUS

## 2018-09-22 MED ORDER — SCOPOLAMINE 1 MG/3DAYS TD PT72
MEDICATED_PATCH | TRANSDERMAL | Status: DC | PRN
  Administered 2018-09-22: 1 via TRANSDERMAL

## 2018-09-22 MED ORDER — LOSARTAN POTASSIUM 50 MG PO TABS
50.0000 mg | ORAL_TABLET | ORAL | Status: DC
  Administered 2018-09-22: 50 mg via ORAL
  Filled 2018-09-22: qty 1

## 2018-09-22 MED ORDER — TRANEXAMIC ACID-NACL 1000-0.7 MG/100ML-% IV SOLN
1000.0000 mg | Freq: Once | INTRAVENOUS | Status: AC
  Administered 2018-09-22: 14:00:00 1000 mg via INTRAVENOUS
  Filled 2018-09-22 (×2): qty 100

## 2018-09-22 MED ORDER — MEPERIDINE HCL 25 MG/ML IJ SOLN
6.2500 mg | INTRAMUSCULAR | Status: DC | PRN
  Filled 2018-09-22: qty 1

## 2018-09-22 MED ORDER — VASOPRESSIN 20 UNIT/ML IV SOLN
INTRAVENOUS | Status: DC | PRN
  Administered 2018-09-22: 6 [IU]

## 2018-09-22 MED ORDER — MIDAZOLAM HCL 2 MG/2ML IJ SOLN
INTRAMUSCULAR | Status: DC | PRN
  Administered 2018-09-22: 2 mg via INTRAVENOUS

## 2018-09-22 MED ORDER — PROPOFOL 10 MG/ML IV BOLUS
INTRAVENOUS | Status: DC | PRN
  Administered 2018-09-22: 200 mg via INTRAVENOUS

## 2018-09-22 MED ORDER — ONDANSETRON HCL 4 MG PO TABS: 4.0000 mg | ORAL_TABLET | Freq: Four times a day (QID) | ORAL | Status: DC | PRN

## 2018-09-22 MED ORDER — LACTATED RINGERS IV SOLN
INTRAVENOUS | Status: DC
  Administered 2018-09-22 (×2): via INTRAVENOUS
  Filled 2018-09-22: qty 1000

## 2018-09-22 MED ORDER — FENTANYL CITRATE (PF) 100 MCG/2ML IJ SOLN
INTRAMUSCULAR | Status: AC
  Filled 2018-09-22: qty 4

## 2018-09-22 MED ORDER — FENTANYL CITRATE (PF) 100 MCG/2ML IJ SOLN
25.0000 ug | INTRAMUSCULAR | Status: DC | PRN
  Filled 2018-09-22: qty 1

## 2018-09-22 MED ORDER — ACETAMINOPHEN 325 MG PO TABS
325.0000 mg | ORAL_TABLET | Freq: Once | ORAL | Status: DC | PRN
  Filled 2018-09-22: qty 2

## 2018-09-22 SURGICAL SUPPLY — 20 items
CATH ROBINSON RED A/P 14FR (CATHETERS) ×2 IMPLANT
CATH ROBINSON RED A/P 16FR (CATHETERS) ×1 IMPLANT
DEVICE MYOSURE LITE (MISCELLANEOUS) IMPLANT
DEVICE MYOSURE REACH (MISCELLANEOUS) IMPLANT
GLOVE BIO SURGEON STRL SZ 6.5 (GLOVE) ×2 IMPLANT
GLOVE BIOGEL PI IND STRL 7.0 (GLOVE) ×2 IMPLANT
GLOVE BIOGEL PI INDICATOR 7.0 (GLOVE) ×2
GOWN STRL REUS W/ TWL LRG LVL3 (GOWN DISPOSABLE) ×2 IMPLANT
GOWN STRL REUS W/TWL LRG LVL3 (GOWN DISPOSABLE) ×4
KIT PROCEDURE FLUENT (KITS) ×2 IMPLANT
KIT TURNOVER KIT B (KITS) ×2 IMPLANT
MYOSURE XL FIBROID (MISCELLANEOUS)
NDL SPNL 22GX3.5 QUINCKE BK (NEEDLE) ×1 IMPLANT
NEEDLE SPNL 22GX3.5 QUINCKE BK (NEEDLE) ×2 IMPLANT
PACK VAGINAL MINOR WOMEN LF (CUSTOM PROCEDURE TRAY) ×2 IMPLANT
PAD OB MATERNITY 4.3X12.25 (PERSONAL CARE ITEMS) ×2 IMPLANT
SEAL ROD LENS SCOPE MYOSURE (ABLATOR) ×2 IMPLANT
SYR CONTROL 10ML LL (SYRINGE) ×2 IMPLANT
SYSTEM TISS REMOVAL MYOSURE XL (MISCELLANEOUS) ×1 IMPLANT
TOWEL OR 17X26 10 PK STRL BLUE (TOWEL DISPOSABLE) ×2 IMPLANT

## 2018-09-22 NOTE — Op Note (Signed)
09/22/2018  1:05 PM  PATIENT:  Patty Williams  58 y.o. female  PRE-OPERATIVE DIAGNOSIS:  Uterine Fibroids, Menorrhagia  POST-OPERATIVE DIAGNOSIS:  Uterine Fibroids, Menorrhagia, irregular endometrium, and a uterine perforation  PROCEDURE:  Procedure(s): DILATATION & CURETTAGE/HYSTEROSCOPY (N/A)  SURGEON:  Surgeon(s) and Role:    Aloha Gell, MD - Primary  PHYSICIAN ASSISTANT:   ASSISTANTS: none   ANESTHESIA:   local and general  EBL:  30 mL   BLOOD ADMINISTERED:none  DRAINS: none   LOCAL MEDICATIONS USED:  BUPIVICAINE  and Amount: 20 cc of a mixture of 0.3 cc vasopressin mixed in 30 cc quarter percent bupivacaine ml  SPECIMEN:  Source of Specimen:  Endometrial curettings  DISPOSITION OF SPECIMEN:  PATHOLOGY  COUNTS:  YES  TOURNIQUET:  * No tourniquets in log *  DICTATION: .Note written in EPIC  PLAN OF CARE: Admit for overnight observation  PATIENT DISPOSITION:  PACU - hemodynamically stable.   Delay start of Pharmacological VTE agent (>24hrs) due to surgical blood loss or risk of bleeding: yes  Findings: Soft uterus, irregular hypertrophic and vascular endometrium, 1.5 cm fundal fibroid, evidence of uterine perforation and abandon procedure so inability to visualize bilateral ostia, apparent adequate  hemostasis post-procedure  Indications: menorrhagia, submucosal fibroid, postmenopausal bleeding   After informed consent including discussion of risks of bleeding, infection, perforation,  the patient was taken to the operating room where general anesthesia was initiated without difficulty. She was prepped and draped in normal sterile fashion in the dorsal supine lithotomy position.  A bimanual examination was done to assess the size and position of the uterus. A speculum was placed in the vagina and single tooth tenaculum used to grasp the anterior lip of the cervix. Local anaesthetic with vasopressin was injected at 5 and 7 o'clock in there  cervico-paracervical junction.   The cervix was sounded to 8 cm.  Prior to surgery ultrasound and sonohysterogram images were reviewed showing 10 cm uterus with sharp anteflexion.  The cervix was then serially dilated to a #21 Pratt dilator. The hysteroscope was inserted under direct visualization.  Stranded myometrial fibers were noticed and my initial impression was that the camera was in a false passage.  Of note the peritoneal cavity was not visualized.  The camera was backed up and under direct visualization advanced again in a anteverted direction.  Entry to the endometrial cavity was noted.  Thick hypertrophied and vascular myometrium was noted along the left uterine wall.  The 1.5 cm fundal fibroid was also noted.  Bleeding was noted and the fluid was continued for better visualization.  However very rapid fluid deficit was noted.  Fluid deficit climbed to about 900 cc and under 2 minutes.  The anesthesiologist also noted the patient to have desaturations into the 80s.  At this point fluid was turned off given the clinical diagnosis of uterine perforation.  Patient was taken out of Trendelenburg position and over the next 10 minutes patient was able to maintain sats of 97% with only 50% O2.  Decision was made to proceed with a sharp curettage and a very gentle sharp curettage was done.  Only minimal tissue was obtained.  Further attempts were not made due to concern of the endometrial curette was in the myometrium rather than the endometrial canal.  A plan was made to proceed with endometrial sampling under ultrasound guidance in the future.  Bimanual uterine compression was done for about 5 minutes and another 10 minutes of waiting to ensure no brisk  bleeding.  Patient was holding her oxygen saturations and no bleeding was noted.  Consideration was given for laparoscopy to assess any possible peritoneal damage and uterine bleeding however on initial passage of the camera, which was under direct  visualization, only stranded myometrial muscle fibers were noted.  No apparent complete perforation was noted.  Given reports of prior surgery and known pelvic adhesive disease decision was made to not proceed with laparoscopy at this point.  Plan will be to admit the patient for serial CBCs and observation.   Ala Dach 09/22/2018 1:07 PM

## 2018-09-22 NOTE — Anesthesia Procedure Notes (Signed)
Procedure Name: LMA Insertion Date/Time: 09/22/2018 12:12 PM Performed by: Wanita Chamberlain, CRNA Pre-anesthesia Checklist: Patient identified, Emergency Drugs available, Suction available, Patient being monitored and Timeout performed Patient Re-evaluated:Patient Re-evaluated prior to induction Oxygen Delivery Method: Circle system utilized Preoxygenation: Pre-oxygenation with 100% oxygen Induction Type: IV induction Ventilation: Mask ventilation without difficulty LMA: LMA inserted LMA Size: 4.0 Number of attempts: 1 Placement Confirmation: breath sounds checked- equal and bilateral,  CO2 detector and positive ETCO2 Tube secured with: Tape Dental Injury: Teeth and Oropharynx as per pre-operative assessment

## 2018-09-22 NOTE — Transfer of Care (Signed)
Immediate Anesthesia Transfer of Care Note  Patient: Patty Williams  Procedure(s) Performed: DILATATION & CURETTAGE/HYSTEROSCOPY (N/A )  Patient Location: PACU  Anesthesia Type:General  Level of Consciousness: awake, alert , oriented and patient cooperative  Airway & Oxygen Therapy: Patient Spontanous Breathing and Patient connected to nasal cannula oxygen  Post-op Assessment: Report given to RN and Post -op Vital signs reviewed and stable  Post vital signs: Reviewed and stable  Last Vitals:  Vitals Value Taken Time  BP 155/89 09/22/2018 12:57 PM  Temp 36.6 C 09/22/2018 12:58 PM  Pulse 79 09/22/2018 12:58 PM  Resp 8 09/22/2018 12:58 PM  SpO2 96 % 09/22/2018 12:58 PM  Vitals shown include unvalidated device data.  Last Pain:  Vitals:   09/22/18 1007  TempSrc: Oral  PainSc: 0-No pain      Patients Stated Pain Goal: 2 (41/58/30 9407)  Complications: No apparent anesthesia complications

## 2018-09-22 NOTE — Brief Op Note (Signed)
09/22/2018  1:05 PM  PATIENT:  Patty Williams  58 y.o. female  PRE-OPERATIVE DIAGNOSIS:  Uterine Fibroids, Menorrhagia  POST-OPERATIVE DIAGNOSIS:  Uterine Fibroids, Menorrhagia, irregular endometrium, and a uterine perforation  PROCEDURE:  Procedure(s): DILATATION & CURETTAGE/HYSTEROSCOPY (N/A)  SURGEON:  Surgeon(s) and Role:    Aloha Gell, MD - Primary  PHYSICIAN ASSISTANT:   ASSISTANTS: none   ANESTHESIA:   local and general  EBL:  30 mL   BLOOD ADMINISTERED:none  DRAINS: none   LOCAL MEDICATIONS USED:  BUPIVICAINE  and Amount: 20 cc of a mixture of 0.3 cc vasopressin mixed in 30 cc quarter percent bupivacaine ml  SPECIMEN:  Source of Specimen:  Endometrial curettings  DISPOSITION OF SPECIMEN:  PATHOLOGY  COUNTS:  YES  TOURNIQUET:  * No tourniquets in log *  DICTATION: .Note written in EPIC  PLAN OF CARE: Admit for overnight observation  PATIENT DISPOSITION:  PACU - hemodynamically stable.   Delay start of Pharmacological VTE agent (>24hrs) due to surgical blood loss or risk of bleeding: yes

## 2018-09-22 NOTE — Anesthesia Preprocedure Evaluation (Addendum)
Anesthesia Evaluation  Patient identified by MRN, date of birth, ID band Patient awake    Reviewed: Allergy & Precautions, NPO status , Patient's Chart, lab work & pertinent test results  History of Anesthesia Complications (+) PONV  Airway Mallampati: II  TM Distance: >3 FB Neck ROM: Full    Dental  (+) Teeth Intact, Dental Advisory Given, Caps, Chipped,    Pulmonary asthma , sleep apnea and Continuous Positive Airway Pressure Ventilation ,    breath sounds clear to auscultation       Cardiovascular hypertension, Pt. on medications  Rhythm:Regular Rate:Normal     Neuro/Psych Anxiety negative neurological ROS     GI/Hepatic Neg liver ROS, GERD  Medicated and Controlled,  Endo/Other  diabetes, Well Controlled, Type 2  Renal/GU negative Renal ROS     Musculoskeletal negative musculoskeletal ROS (+)   Abdominal Normal abdominal exam  (+)   Peds  Hematology  (+) anemia ,   Anesthesia Other Findings Pt took Omeprazol  And Cozaar this am.  Reproductive/Obstetrics                         Lab Results  Component Value Date   WBC 7.4 09/19/2018   HGB 12.8 09/19/2018   HCT 40.5 09/19/2018   MCV 91.4 09/19/2018   PLT 393 09/19/2018   Lab Results  Component Value Date   CREATININE 0.61 09/19/2018   BUN 20 09/19/2018   NA 140 09/19/2018   K 4.1 09/19/2018   CL 108 09/19/2018   CO2 22 09/19/2018   No results found for: INR, PROTIME  COVID-19 Labs  No results for input(s): DDIMER, FERRITIN, LDH, CRP in the last 72 hours.  Lab Results  Component Value Date   SARSCOV2NAA NOT DETECTED 09/19/2018     Anesthesia Physical Anesthesia Plan  ASA: II  Anesthesia Plan: General   Post-op Pain Management:    Induction: Intravenous  PONV Risk Score and Plan: 4 or greater and Ondansetron, Dexamethasone, Midazolam and Scopolamine patch - Pre-op  Airway Management Planned: Oral  ETT  Additional Equipment: None  Intra-op Plan:   Post-operative Plan: Extubation in OR  Informed Consent: I have reviewed the patients History and Physical, chart, labs and discussed the procedure including the risks, benefits and alternatives for the proposed anesthesia with the patient or authorized representative who has indicated his/her understanding and acceptance.     Dental advisory given  Plan Discussed with: CRNA  Anesthesia Plan Comments:        Anesthesia Quick Evaluation

## 2018-09-23 DIAGNOSIS — D250 Submucous leiomyoma of uterus: Secondary | ICD-10-CM | POA: Diagnosis not present

## 2018-09-23 LAB — COMPREHENSIVE METABOLIC PANEL
ALT: 20 U/L (ref 0–44)
AST: 18 U/L (ref 15–41)
Albumin: 3.3 g/dL — ABNORMAL LOW (ref 3.5–5.0)
Alkaline Phosphatase: 53 U/L (ref 38–126)
Anion gap: 8 (ref 5–15)
BUN: 14 mg/dL (ref 6–20)
CO2: 21 mmol/L — ABNORMAL LOW (ref 22–32)
Calcium: 7.8 mg/dL — ABNORMAL LOW (ref 8.9–10.3)
Chloride: 110 mmol/L (ref 98–111)
Creatinine, Ser: 0.65 mg/dL (ref 0.44–1.00)
GFR calc Af Amer: >60 mL/min (ref >60)
GFR calc non Af Amer: >60 mL/min (ref >60)
Glucose, Bld: 137 mg/dL — ABNORMAL HIGH (ref 70–99)
Potassium: 3.6 mmol/L (ref 3.5–5.1)
Sodium: 139 mmol/L (ref 135–145)
Total Bilirubin: 0.5 mg/dL (ref 0.3–1.2)
Total Protein: 6.8 g/dL (ref 6.5–8.1)

## 2018-09-23 LAB — CBC WITH DIFFERENTIAL/PLATELET
Abs Immature Granulocytes: 0.06 10*3/uL (ref 0.00–0.07)
Basophils Absolute: 0 10*3/uL (ref 0.0–0.1)
Basophils Relative: 0 %
Eosinophils Absolute: 0 10*3/uL (ref 0.0–0.5)
Eosinophils Relative: 0 %
HCT: 36.9 % (ref 36.0–46.0)
Hemoglobin: 11.6 g/dL — ABNORMAL LOW (ref 12.0–15.0)
Immature Granulocytes: 1 %
Lymphocytes Relative: 14 %
Lymphs Abs: 1.4 10*3/uL (ref 0.7–4.0)
MCH: 28.9 pg (ref 26.0–34.0)
MCHC: 31.4 g/dL (ref 30.0–36.0)
MCV: 91.8 fL (ref 80.0–100.0)
Monocytes Absolute: 0.4 10*3/uL (ref 0.1–1.0)
Monocytes Relative: 4 %
Neutro Abs: 8 10*3/uL — ABNORMAL HIGH (ref 1.7–7.7)
Neutrophils Relative %: 81 %
Platelets: 348 10*3/uL (ref 150–400)
RBC: 4.02 MIL/uL (ref 3.87–5.11)
RDW: 15 % (ref 11.5–15.5)
WBC: 9.9 10*3/uL (ref 4.0–10.5)
nRBC: 0 % (ref 0.0–0.2)

## 2018-09-23 MED ORDER — IBUPROFEN 800 MG PO TABS: 800.0000 mg | ORAL_TABLET | Freq: Three times a day (TID) | ORAL | 0 refills | Status: DC | PRN

## 2018-09-23 MED ORDER — IBUPROFEN 800 MG PO TABS: 800.0000 mg | ORAL_TABLET | Freq: Three times a day (TID) | ORAL | Status: DC | PRN

## 2018-09-23 MED ORDER — NORETHINDRONE ACETATE 5 MG PO TABS: 10.0000 mg | ORAL_TABLET | Freq: Every day | ORAL | 1 refills | Status: DC

## 2018-09-23 MED ORDER — OXYCODONE-ACETAMINOPHEN 5-325 MG PO TABS: 1.0000 | ORAL_TABLET | ORAL | 0 refills | Status: DC | PRN

## 2018-09-23 NOTE — Discharge Summary (Signed)
Patty Williams MRN: [id number] DOB/AGE: 11-Apr-1961 58 y.o.  Admit date: 09/22/2018 Discharge date: 09/23/2018  Admission Diagnoses: Uterine Fibroids, Menorrhagia  Discharge Diagnoses: Uterine Fibroids, Menorrhagia        Active Problems:   Vaginal bleeding Uterine perforation at the time of hysteroscopy  Discharged Condition: stable  Hospital Course: Patient was admitted for hysteroscopic myomectomy for known 1.5 cm fundal fibroid.  Patient was known to have severe pelvic adhesive disease and anteverted uterus.  At the time of surgery intraoperative perforation of the uterus was noted in the posterior uterus.  The myomectomy was not begun.  Aggressive bleeding was not noted.  Patient was admitted for overnight observation.  Adelina Mings was given IV x1.  CBC 4 hours postoperative was stable from her preoperative CBC.  Patient was then allowed to eat but remained for overnight observation.  Patient reports no pain medication use.  Good flatus, tolerating p.o., no pain, no vaginal bleeding.  Patient notes slight blood tinge to her urine which she has had previously.  Exam on postop day #1. Vitals:   09/23/18 0206 09/23/18 0559  BP: 118/73 128/71  Pulse: (!) 54 (!) 45  Resp: 16 15  Temp: 98.1 F (36.7 C) 98.4 F (36.9 C)  SpO2: 95% 99%  General: Well-appearing, no distress Abdomen: Obese, soft, no distention, nontender, no tympany GU: No blood on the pad Lower extremity: Nontender, no edema  CBC Latest Ref Rng & Units 09/23/2018 09/22/2018 09/19/2018  WBC 4.0 - 10.5 K/uL 9.9 10.3 7.4  Hemoglobin 12.0 - 15.0 g/dL 11.6(L) 12.6 12.8  Hematocrit 36.0 - 46.0 % 36.9 40.1 40.5  Platelets 150 - 400 K/uL 348 357 393   CMP Latest Ref Rng & Units 09/23/2018 09/19/2018 12/10/2017  Glucose 70 - 99 mg/dL 137(H) 118(H) 109(H)  BUN 6 - 20 mg/dL 14 20 15   Creatinine 0.44 - 1.00 mg/dL 0.65 0.61 0.63  Sodium 135 - 145 mmol/L 139 140 140  Potassium 3.5 - 5.1 mmol/L 3.6 4.1 4.0  Chloride 98 - 111 mmol/L 110  108 107  CO2 22 - 32 mmol/L 21(L) 22 24  Calcium 8.9 - 10.3 mg/dL 7.8(L) 8.9 8.7  Total Protein 6.5 - 8.1 g/dL 6.8 - 7.0  Total Bilirubin 0.3 - 1.2 mg/dL 0.5 - 0.3  Alkaline Phos 38 - 126 U/L 53 - 56  AST 15 - 41 U/L 18 - 17  ALT 0 - 44 U/L 20 - 27     Consults: None  Treatments: surgery: Hysteroscopy with abandoned myomectomy due to uterine perforation: IV fluids and expectant management  Disposition: Discharge disposition: 01-Home or Self Care      Plan for follow-up in the office in 2 weeks to complete endometrial biopsy and then further surgery pending those results  Allergies as of 09/23/2018   No Known Allergies     Medication List    TAKE these medications   albuterol 108 (90 Base) MCG/ACT inhaler Commonly known as:  Proventil HFA Inhale 2 puffs into the lungs every 4 (four) hours as needed. What changed:  reasons to take this   budesonide-formoterol 80-4.5 MCG/ACT inhaler Commonly known as:  Symbicort USE 2 INHALATIONS TWICE A DAY What changed:    how much to take  how to take this  when to take this  additional instructions   Fish Oil 1000 MG Caps Take 2,000 mg by mouth daily.   hydrocortisone 25 MG suppository Commonly known as:  ANUSOL-HC Place 1 suppository (25 mg total) rectally 2 (  two) times daily. As needed for hemorrhoid   ibuprofen 800 MG tablet Commonly known as:  ADVIL Take 1 tablet (800 mg total) by mouth every 8 (eight) hours as needed for moderate pain.   Iron 90 (18 Fe) MG Tabs Take 180 mg by mouth daily.   losartan 50 MG tablet Commonly known as:  COZAAR Take 1 tablet (50 mg total) by mouth daily.   Magnesium 400 MG Caps Take 400 mg by mouth daily.   multivitamin tablet Take 1 tablet by mouth daily.   NONFORMULARY OR COMPOUNDED ITEM Exercise prescription:  Personal training sessions for patient to strengthen quadriceps musculature and core stability for knee pain and knee stability. Dx: M67.52, M23.92   norethindrone  5 MG tablet Commonly known as:  AYGESTIN Take 2 tablets (10 mg total) by mouth daily. Start 5 days after surgery What changed:  additional instructions   omeprazole 20 MG capsule Commonly known as:  PRILOSEC Take 2 capsules (40 mg total) by mouth daily.   oxyCODONE-acetaminophen 5-325 MG tablet Commonly known as:  PERCOCET/ROXICET Take 1-2 tablets by mouth every 4 (four) hours as needed for moderate pain.   Vitamin D 50 MCG (2000 UT) Caps Take 2,000 Units by mouth daily.        Signed: Ala Dach, MD 09/23/2018, 8:07 AM

## 2018-09-23 NOTE — Anesthesia Postprocedure Evaluation (Signed)
Anesthesia Post Note  Patient: Patty Williams  Procedure(s) Performed: DILATATION & CURETTAGE/HYSTEROSCOPY (N/A )     Patient location during evaluation: PACU Anesthesia Type: General Level of consciousness: awake and alert Pain management: pain level controlled Vital Signs Assessment: post-procedure vital signs reviewed and stable Respiratory status: spontaneous breathing, nonlabored ventilation, respiratory function stable and patient connected to nasal cannula oxygen Cardiovascular status: blood pressure returned to baseline and stable Postop Assessment: no apparent nausea or vomiting Anesthetic complications: no    Last Vitals:  Vitals:   09/23/18 0559 09/23/18 0853  BP: 128/71 (!) 148/86  Pulse: (!) 45 76  Resp: 15   Temp: 36.9 C 36.9 C  SpO2: 99% 92%    Last Pain:  Vitals:   09/23/18 1009  TempSrc:   PainSc: 0-No pain   Pain Goal: Patients Stated Pain Goal: 0 (09/23/18 0313)                 Effie Berkshire

## 2018-09-23 NOTE — Progress Notes (Signed)
Discharge instructions discussed with patient, verbalized agreement and understanding 

## 2018-12-14 ENCOUNTER — Telehealth: Payer: Self-pay | Admitting: Family Medicine

## 2018-12-14 DIAGNOSIS — E7800 Pure hypercholesterolemia, unspecified: Secondary | ICD-10-CM

## 2018-12-14 DIAGNOSIS — Z0000 Encounter for general adult medical examination without abnormal findings: Secondary | ICD-10-CM

## 2018-12-14 DIAGNOSIS — R7303 Prediabetes: Secondary | ICD-10-CM

## 2018-12-14 DIAGNOSIS — I10 Essential (primary) hypertension: Secondary | ICD-10-CM

## 2018-12-14 NOTE — Telephone Encounter (Signed)
-----   Message from Cloyd Stagers, RT sent at 12/07/2018  2:37 PM EDT ----- Regarding: Lab Orders for Friday 8.7.2020 Please place lab orders for Friday 8.7.2020, office visit for physical on Friday 8.14.2020 Thank you, Dyke Maes RT(R)

## 2018-12-16 ENCOUNTER — Other Ambulatory Visit: Payer: Self-pay | Admitting: *Deleted

## 2018-12-16 ENCOUNTER — Telehealth: Payer: Self-pay

## 2018-12-16 ENCOUNTER — Other Ambulatory Visit (INDEPENDENT_AMBULATORY_CARE_PROVIDER_SITE_OTHER): Payer: 59

## 2018-12-16 ENCOUNTER — Other Ambulatory Visit: Payer: Self-pay

## 2018-12-16 DIAGNOSIS — I10 Essential (primary) hypertension: Secondary | ICD-10-CM

## 2018-12-16 DIAGNOSIS — R7303 Prediabetes: Secondary | ICD-10-CM

## 2018-12-16 DIAGNOSIS — E7800 Pure hypercholesterolemia, unspecified: Secondary | ICD-10-CM

## 2018-12-16 LAB — LIPID PANEL
Cholesterol: 160 mg/dL (ref 0–200)
HDL: 37.7 mg/dL — ABNORMAL LOW (ref >39.00)
LDL Cholesterol: 100 mg/dL — ABNORMAL HIGH (ref 0–99)
NonHDL: 122.67
Total CHOL/HDL Ratio: 4
Triglycerides: 115 mg/dL (ref 0.0–149.0)
VLDL: 23 mg/dL (ref 0.0–40.0)

## 2018-12-16 LAB — CBC WITH DIFFERENTIAL/PLATELET
Basophils Absolute: 0.1 10*3/uL (ref 0.0–0.1)
Basophils Relative: 0.7 % (ref 0.0–3.0)
Eosinophils Absolute: 0.1 10*3/uL (ref 0.0–0.7)
Eosinophils Relative: 1 % (ref 0.0–5.0)
HCT: 38.2 % (ref 36.0–46.0)
Hemoglobin: 12.6 g/dL (ref 12.0–15.0)
Lymphocytes Relative: 37.8 % (ref 12.0–46.0)
Lymphs Abs: 3 10*3/uL (ref 0.7–4.0)
MCHC: 33 g/dL (ref 30.0–36.0)
MCV: 87.6 fl (ref 78.0–100.0)
Monocytes Absolute: 0.6 10*3/uL (ref 0.1–1.0)
Monocytes Relative: 6.9 % (ref 3.0–12.0)
Neutro Abs: 4.3 10*3/uL (ref 1.4–7.7)
Neutrophils Relative %: 53.6 % (ref 43.0–77.0)
Platelets: 306 10*3/uL (ref 150.0–400.0)
RBC: 4.36 Mil/uL (ref 3.87–5.11)
RDW: 14.9 % (ref 11.5–15.5)
WBC: 8 10*3/uL (ref 4.0–10.5)

## 2018-12-16 LAB — TSH: TSH: 1.65 u[IU]/mL (ref 0.35–4.50)

## 2018-12-16 LAB — COMPREHENSIVE METABOLIC PANEL
ALT: 15 U/L (ref 0–35)
AST: 14 U/L (ref 0–37)
Albumin: 4.1 g/dL (ref 3.5–5.2)
Alkaline Phosphatase: 57 U/L (ref 39–117)
BUN: 23 mg/dL (ref 6–23)
CO2: 21 mEq/L (ref 19–32)
Calcium: 9.2 mg/dL (ref 8.4–10.5)
Chloride: 109 mEq/L (ref 96–112)
Creatinine, Ser: 0.65 mg/dL (ref 0.40–1.20)
GFR: 93.38 mL/min (ref >60.00)
Glucose, Bld: 104 mg/dL — ABNORMAL HIGH (ref 70–99)
Potassium: 3.7 mEq/L (ref 3.5–5.1)
Sodium: 141 mEq/L (ref 135–145)
Total Bilirubin: 0.3 mg/dL (ref 0.2–1.2)
Total Protein: 7.3 g/dL (ref 6.0–8.3)

## 2018-12-16 LAB — HEMOGLOBIN A1C: Hgb A1c MFr Bld: 6.3 % (ref 4.6–6.5)

## 2018-12-16 NOTE — Telephone Encounter (Signed)
Per chart review under lab tab lab has been collected.

## 2018-12-16 NOTE — Telephone Encounter (Signed)
[facility] Night - Client Nonclinical Telephone Record [facility] Client [place] Night - Client Client Site [facility] Physician Loura Pardon - MD Contact Type Call Who Is Calling Patient / Member / Family / Caregiver Caller Name [place] Phone Number [phone] Patient Name Patty Williams Patient DOB 2060/10/23 Call Type Message Only Information Provided Reason for Call Request for General Office Information Initial Comment running about 20 min late for her lab appt. Additional Comment Call Closed By: Donato Heinz Transaction Date/Time: 12/16/2018 7:45:14 AM (ET)

## 2018-12-23 ENCOUNTER — Encounter: Payer: Self-pay | Admitting: Family Medicine

## 2018-12-23 ENCOUNTER — Ambulatory Visit (INDEPENDENT_AMBULATORY_CARE_PROVIDER_SITE_OTHER): Payer: 59 | Admitting: Family Medicine

## 2018-12-23 ENCOUNTER — Other Ambulatory Visit: Payer: Self-pay

## 2018-12-23 VITALS — BP 132/80 | HR 79 | Temp 98.1°F | Ht 65.0 in | Wt 220.2 lb

## 2018-12-23 DIAGNOSIS — Z1211 Encounter for screening for malignant neoplasm of colon: Secondary | ICD-10-CM | POA: Insufficient documentation

## 2018-12-23 DIAGNOSIS — G4733 Obstructive sleep apnea (adult) (pediatric): Secondary | ICD-10-CM

## 2018-12-23 DIAGNOSIS — N809 Endometriosis, unspecified: Secondary | ICD-10-CM

## 2018-12-23 DIAGNOSIS — K219 Gastro-esophageal reflux disease without esophagitis: Secondary | ICD-10-CM

## 2018-12-23 DIAGNOSIS — R7303 Prediabetes: Secondary | ICD-10-CM

## 2018-12-23 DIAGNOSIS — Z6836 Body mass index (BMI) 36.0-36.9, adult: Secondary | ICD-10-CM

## 2018-12-23 DIAGNOSIS — J4520 Mild intermittent asthma, uncomplicated: Secondary | ICD-10-CM

## 2018-12-23 DIAGNOSIS — Z0000 Encounter for general adult medical examination without abnormal findings: Secondary | ICD-10-CM

## 2018-12-23 DIAGNOSIS — Z841 Family history of disorders of kidney and ureter: Secondary | ICD-10-CM | POA: Insufficient documentation

## 2018-12-23 DIAGNOSIS — E7800 Pure hypercholesterolemia, unspecified: Secondary | ICD-10-CM

## 2018-12-23 DIAGNOSIS — I10 Essential (primary) hypertension: Secondary | ICD-10-CM | POA: Diagnosis not present

## 2018-12-23 DIAGNOSIS — R1013 Epigastric pain: Secondary | ICD-10-CM | POA: Insufficient documentation

## 2018-12-23 DIAGNOSIS — Z836 Family history of other diseases of the respiratory system: Secondary | ICD-10-CM | POA: Insufficient documentation

## 2018-12-23 MED ORDER — LOSARTAN POTASSIUM 50 MG PO TABS: 50.0000 mg | ORAL_TABLET | Freq: Every day | ORAL | 11 refills | Status: DC

## 2018-12-23 MED ORDER — OMEPRAZOLE 20 MG PO CPDR: 40.0000 mg | DELAYED_RELEASE_CAPSULE | Freq: Every day | ORAL | 11 refills | Status: DC

## 2018-12-23 NOTE — Assessment & Plan Note (Signed)
Due for colonoscopy in sept  She is having GERD/epigastric pain and wants to discuss whether she also needs EGD GI ref done

## 2018-12-23 NOTE — Assessment & Plan Note (Addendum)
?   Atrophic kidney/anatomical variations  I would like to get a renal US She will check with ins to see if covered and let us know

## 2018-12-23 NOTE — Progress Notes (Signed)
Subjective:    Patient ID: Patty Williams, female    DOB: November 28, 1960, 58 y.o.   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    Flu vaccine -will get in the fall  shingrix vaccines 2019 PNA 23 vaccine 8/19   Colonoscopy 9/10  Due in sept  Is interested in EGD as well given her abd symptoms     Mammogram 10/19  Self breast exam - no lumps   Pap 11/17 nl at gyn Had laparoscopy for fibroids - (a lot of bleeding)  Tried it and her tissues were too friable to do the surgery  rignt now on megace and doing better  Endo bx was ok  Some bleeding but not much -hopes this will continue to go down Waiting for fibroids will shrink  Endometriosis is bad and a hysterectomy would result in bowel/bladder damage no doubt   Tdap 3/20   Weight  Wt Readings from Last 3 Encounters:  12/23/18 220 lb 3 oz (99.9 kg)  09/22/18 212 lb 6.4 oz (96.3 kg)  05/19/18 219 lb 8 oz (99.6 kg)   36.64 kg/m  She notes wt gain after return to work (after loosing 14 lb during the shut down) On megace for vaginal bleeding  Was eating better at home    Hs OSA and neds pulm f/u for titration  She is also interested in screen for pulm fibrosis- mom died of it and a cluster of dentists in [place] have had it)   She had N95 fit test and could not get full clearance until PFTs are performed  Her 02 sat after 30 minutes of double mask is about 89% She feels sob also   Had ekg that showed possible LVH  Rev -had signs of L axis deviation    Hypertension bp is stable today  No cp or palpitations or headaches or edema  No side effects to medicines  BP Readings from Last 3 Encounters:  12/23/18 132/80  09/23/18 (!) 148/86  05/19/18 132/84     Taking losartan   Pulse Readings from Last 3  Encounters:  12/23/18 79  09/23/18 76  05/19/18 72    Gerd  Omeprazole 40 mg daily  She has woken up with epigastric pain rad to her back  Improves when she gets up and moves around after about 20 minutes  Uses cpap  Burps in am (she is swallowing air)  She found out via her brother that there is a family hx of kidney problems (abn size or function) and also renal artery aneurysm  [place] or [place] was recommended    Allergies/asthma - having trouble with N95/breathing  Hyperlipidemia Lab Results  Component Value Date   CHOL 160 12/16/2018   CHOL 154 12/10/2017   CHOL 154 12/10/2016   Lab Results  Component Value Date   HDL 37.70 (L) 12/16/2018   HDL 47.80 12/10/2017   HDL 47.20 12/10/2016   Lab Results  Component Value Date   LDLCALC 100 (H) 12/16/2018   [place] 95 12/10/2017   [place] 96 12/10/2016   Lab Results  Component Value Date   TRIG 115.0 12/16/2018   TRIG 54.0 12/10/2017   TRIG 54.0 12/10/2016   Lab Results  Component Value Date  CHOLHDL 4 12/16/2018   CHOLHDL 3 12/10/2017   CHOLHDL 3 12/10/2016   Lab Results  Component Value Date   LDLDIRECT 144.2 09/21/2008    Prediabetes Lab Results  Component Value Date   HGBA1C 6.3 12/16/2018    Lab Results  Component Value Date   CREATININE 0.65 12/16/2018   BUN 23 12/16/2018   NA 141 12/16/2018   K 3.7 12/16/2018   CL 109 12/16/2018   CO2 21 12/16/2018   Lab Results  Component Value Date   ALT 15 12/16/2018   AST 14 12/16/2018   ALKPHOS 57 12/16/2018   BILITOT 0.3 12/16/2018    Lab Results  Component Value Date   WBC 8.0 12/16/2018   HGB 12.6 12/16/2018   HCT 38.2 12/16/2018   MCV 87.6 12/16/2018   PLT 306.0 12/16/2018    Lab Results  Component Value Date   TSH 1.65 12/16/2018    Patient Active Problem List   Diagnosis Date Noted  . Family history of pulmonary fibrosis 12/23/2018  . Family history of kidney disease 12/23/2018  . Colon cancer screening 12/23/2018  . Epigastric pain  12/23/2018  . Vaginal bleeding 09/22/2018  . Class 2 severe obesity due to excess calories with serious comorbidity and body mass index (BMI) of 36.0 to 36.9 in adult ([place]) 10/20/2015  . Current use of proton pump inhibitor 10/18/2015  . History of fracture 10/18/2015  . Left knee pain 04/19/2015  . Frequent UTI 10/06/2013  . Seasonal and perennial allergic rhinitis 01/01/2013  . Routine general medical examination at a health care facility 04/30/2011  . Prediabetes   . Plantar fasciitis   . METATARSALGIA 04/11/2010  . 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 Obstructive sleep apnea 06/10/2009  . GERD 11/09/2008  . Hyperlipidemia 07/20/2008  . Essential hypertension, benign 03/02/2008  . Mild reactive airways disease 03/29/2007  . Endometriosis 03/29/2007   Past Medical History:  Diagnosis Date  . ADD (attention deficit disorder) 12/2006   adult, pt denies  . Asthma   . Diabetes mellitus type II    mild  . Diverticulosis 2014   Mild  . Elevated blood pressure reading without diagnosis of hypertension   . Endometriosis    Severe  . Fibroids   . GERD (gastroesophageal reflux disease)   . History of ovarian cyst   . HLD (hyperlipidemia)   . Hypertension   . Iron deficiency anemia   . Obesity   . Obesity   . OSA on CPAP   . Plantar fasciitis    with orthotics-much difficulty adjusting these  . PMB (postmenopausal bleeding)   . Pneumonia    history of  . PONV (postoperative nausea and vomiting)   . Pre-diabetes   . Rectovaginal fistula   . Stress reaction   . Wears glasses    Past Surgical History:  Procedure Laterality Date  . COLONOSCOPY  01/2009  . DILATATION & CURETTAGE/HYSTEROSCOPY WITH MYOSURE N/A 09/22/2018   Procedure: DILATATION & CURETTAGE/HYSTEROSCOPY;  Surgeon: Aloha Gell, MD;  Location: [facility];  Service: Gynecology;  Laterality: N/A;  . HEMORRHOID SURGERY    . LAPAROSCOPY     endometriosis  . UPPER GI ENDOSCOPY  01/2009  .  UTERINE SUSPENSION     Social History   Tobacco Use  . Smoking status: Never Smoker  . Smokeless tobacco: Never Used  Substance Use Topics  . Alcohol use: Yes    Alcohol/week: 0.0 standard drinks    Comment: social  . Drug  use: No   Family History  Problem Relation Age of Onset  . Cancer Father        prostate  . Thyroid disease Mother        ?  . Diabetes Mother   . Hypertension Mother   . Heart attack Other        grandfather (in his 79's)  . Parkinsonism Other        grandfater   No Known Allergies Current Outpatient Medications on File Prior to Visit  Medication Sig Dispense Refill  . albuterol (PROVENTIL HFA) 108 (90 Base) MCG/ACT inhaler Inhale 2 puffs into the lungs every 4 (four) hours as needed. (Patient taking differently: Inhale 2 puffs into the lungs every 4 (four) hours as needed for wheezing or shortness of breath. ) 3 Inhaler 1  . budesonide-formoterol (SYMBICORT) 80-4.5 MCG/ACT inhaler USE 2 INHALATIONS TWICE A DAY (Patient taking differently: Inhale 1 puff into the lungs daily. ) 30.6 g 11  . Cholecalciferol (VITAMIN D) 50 MCG (2000 UT) CAPS Take 2,000 Units by mouth daily.    . Ferrous Sulfate (IRON) 90 (18 Fe) MG TABS Take 180 mg by mouth daily.    . hydrocortisone (ANUSOL-HC) 25 MG suppository Place 1 suppository (25 mg total) rectally 2 (two) times daily. As needed for hemorrhoid 12 suppository 1  . ibuprofen (ADVIL) 800 MG tablet Take 1 tablet (800 mg total) by mouth every 8 (eight) hours as needed for moderate pain. 30 tablet 0  . Magnesium 400 MG CAPS Take 400 mg by mouth daily.     . megestrol (MEGACE) 40 MG tablet Take 1 tablet by mouth 2 (two) times daily.    . Multiple Vitamin (MULTIVITAMIN) tablet Take 1 tablet by mouth daily.      . NONFORMULARY OR COMPOUNDED ITEM Exercise prescription:  Personal training sessions for patient to strengthen quadriceps musculature and core stability for knee pain and knee stability. Dx: M67.52, M23.92 1 each 0  .  Omega-3 Fatty Acids (FISH OIL) 1000 MG CAPS Take 2,000 mg by mouth daily.     No current facility-administered medications on file prior to visit.     Review of Systems  Constitutional: Positive for fatigue. Negative for activity change, appetite change, fever and unexpected weight change.  HENT: Negative for congestion, ear pain, rhinorrhea, sinus pressure and sore throat.   Eyes: Negative for pain, redness and visual disturbance.  Respiratory: Positive for shortness of breath and wheezing. Negative for cough and chest tightness.        Sob when wearing N95 at work  Cardiovascular: Negative for chest pain, palpitations and leg swelling.  Gastrointestinal: Positive for abdominal pain. Negative for anal bleeding, blood in stool, constipation, diarrhea, nausea, rectal pain and vomiting.  Endocrine: Negative for polydipsia and polyuria.  Genitourinary: Positive for vaginal bleeding. Negative for dysuria, frequency and urgency.  Musculoskeletal: Negative for arthralgias, back pain and myalgias.       Chronic foot pain  Skin: Negative for pallor and rash.  Allergic/Immunologic: Positive for environmental allergies.  Neurological: Negative for dizziness, syncope and headaches.  Hematological: Negative for adenopathy. Does not bruise/bleed easily.  Psychiatric/Behavioral: Negative for decreased concentration and dysphoric mood. The patient is not nervous/anxious.        Stressed       Objective:   Physical Exam Constitutional:      General: She is not in acute distress.    Appearance: She is well-developed. She is obese. She is not ill-appearing or diaphoretic.  HENT:     Head: Normocephalic and atraumatic.     Right Ear: Tympanic membrane, ear canal and external ear normal.     Left Ear: Tympanic membrane, ear canal and external ear normal.     Nose: Nose normal.     Mouth/Throat:     Mouth: Mucous membranes are moist.     Pharynx: Oropharynx is clear. No posterior oropharyngeal  erythema.  Eyes:     General: No scleral icterus.       Right eye: No discharge.        Left eye: No discharge.     Conjunctiva/sclera: Conjunctivae normal.     Pupils: Pupils are equal, round, and reactive to light.  Neck:     Musculoskeletal: Normal range of motion and neck supple. No neck rigidity or muscular tenderness.     Thyroid: No thyromegaly.     Vascular: No carotid bruit or JVD.  Cardiovascular:     Rate and Rhythm: Normal rate and regular rhythm.     Pulses: Normal pulses.     Heart sounds: Normal heart sounds. No gallop.   Pulmonary:     Effort: Pulmonary effort is normal. No respiratory distress.     Breath sounds: Normal breath sounds. No stridor. No wheezing, rhonchi or rales.     Comments: Good air exch No crackles or rales  No wheezing Abdominal:     General: Bowel sounds are normal. There is no distension.     Palpations: Abdomen is soft. There is no mass or pulsatile mass.     Tenderness: There is no abdominal tenderness.     Hernia: No hernia is present.  Musculoskeletal:        General: No tenderness.     Right lower leg: No edema.     Left lower leg: No edema.  Lymphadenopathy:     Cervical: No cervical adenopathy.  Skin:    General: Skin is warm and dry.     Coloration: Skin is not pale.     Findings: No erythema or rash.  Neurological:     Mental Status: She is alert.     Cranial Nerves: No cranial nerve deficit.     Motor: No abnormal muscle tone.     Coordination: Coordination normal.     Deep Tendon Reflexes: Reflexes are normal and symmetric.  Psychiatric:        Mood and Affect: Mood normal.           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, community) She currently uses symbicort which otherwise controls symptoms  Will ref to pulmonary for this and discussion of several other issues       Relevant Orders   Ambulatory referral to Pulmonology   Obstructive sleep apnea    With wt gain pt suspects she needs to re visit her titrations/may need sleep study She is also waking up with epigastric  pain (thinks she may be swallowing air) Ref to pulm for this and other issues       Relevant Orders   Ambulatory referral to Pulmonology     Digestive   GERD    Continues omeprazole 40 mg  Has woken up with epigastric pain rad to back-that resolves once she moves around  ? If GI/acid related or if she may be swallowing air from cpap  Ref done to GI      Relevant Medications   omeprazole (PRILOSEC) 20 MG capsule   Other Relevant Orders   Ambulatory referral to Gastroenterology     Other   Hyperlipidemia    Disc goals for lipids and reasons to control them Rev last labs with pt Rev low sat fat diet in detail LDL of 100  HDL down to 37.7 -enc her to get back to exercise when she can      Relevant Medications   losartan (COZAAR) 50 MG tablet   Endometriosis    Severe enough that a hysterectomy would be difficult (adhesions)  She has ongoing bleeding and gyn/surg follows closely  On megace now      Prediabetes    Lab Results  Component Value Date   HGBA1C 6.3 12/16/2018   disc imp of low glycemic diet and wt loss to prevent DM2       Routine general medical examination at a health care facility - Primary    Reviewed health habits including diet and exercise and skin cancer prevention Reviewed appropriate screening tests for age  Also reviewed health mt list, fam hx and immunization status , as well as social and family history   See HPI  Labs reviewed Disc getting back to exercise when able Disc need to sell practice to help stress and enable her to care for herself better Planning flu shot in the fall Screening colonoscopy due in sept- ref done  Continues gyn care  She will contact her ins co re: coverage for renal US given fam hx of kidney abn      Class 2 severe obesity due to excess calories with serious comorbidity and body mass index (BMI) of 36.0 to 36.9 in adult [facility])    Discussed how this problem influences overall health and the risks it imposes  Reviewed plan for weight loss with lower calorie diet (via better food choices and also portion control or program like weight watchers) and exercise building up to or more than 30 minutes 5 days per week including some aerobic activity   Work is very stressful and affects her ability to care for self (also raises cortisol no doubt)  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 Rel to her reactive airways  Hypoxia when wearing N95 for work  Ref done      Relevant Orders   Ambulatory referral to Pulmonology   Family history of kidney disease    ? Atrophic kidney/anatomical variations  I would like to get a renal US She will check with ins to see if covered and let us know       Colon cancer screening    Due for colonoscopy in sept  She is having GERD/epigastric pain and wants to discuss whether she also needs EGD GI ref done      Relevant Orders   Ambulatory referral to Gastroenterology  Epigastric pain    In ams  Disc poss of gastritis (on ppi and H2) vs  ? Swallowing air from cpap Gi and pulm referrals are pending

## 2018-12-23 NOTE — Patient Instructions (Addendum)
Get your flu shot in the fall   Call insurance re: coverage of renal uS/abd [place]   I placed Gi and pulm referrals -the office will call   Sell your practice!  Take care of yourself

## 2018-12-23 NOTE — Assessment & Plan Note (Signed)
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

## 2018-12-23 NOTE — Assessment & Plan Note (Signed)
In ams  Disc poss of gastritis (on ppi and H2) vs  ? Swallowing air from cpap Gi and pulm referrals are pending

## 2018-12-23 NOTE — Assessment & Plan Note (Signed)
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

## 2018-12-23 NOTE — Assessment & Plan Note (Signed)
bp in fair control at this time  BP Readings from Last 1 Encounters:  12/23/18 132/80   No changes needed Most recent labs reviewed  Disc lifstyle change with low sodium diet and exercise

## 2018-12-23 NOTE — Assessment & Plan Note (Signed)
With wt gain pt suspects she needs to re visit her titrations/may need sleep study She is also waking up with epigastric pain (thinks she may be swallowing air) Ref to pulm for this and other issues

## 2018-12-25 NOTE — Assessment & Plan Note (Signed)
Lab Results  Component Value Date   HGBA1C 6.3 12/16/2018   disc imp of low glycemic diet and wt loss to prevent DM2

## 2018-12-25 NOTE — Assessment & Plan Note (Signed)
Disc goals for lipids and reasons to control them Rev last labs with pt Rev low sat fat diet in detail LDL of 100  HDL down to 37.7 -enc her to get back to exercise when she can

## 2018-12-25 NOTE — Assessment & Plan Note (Signed)
Reviewed health habits including diet and exercise and skin cancer prevention Reviewed appropriate screening tests for age  Also reviewed health mt list, fam hx and immunization status , as well as social and family history   See HPI Labs reviewed Disc getting back to exercise when able Disc need to sell practice to help stress and enable her to care for herself better Planning flu shot in the fall Screening colonoscopy due in sept- ref done  Continues gyn care  She will contact her ins co re: coverage for renal US given fam hx of kidney abn

## 2018-12-25 NOTE — Assessment & Plan Note (Signed)
Pt wants to discuss with pulmonary  Has some sob /? Rel to her reactive airways  Hypoxia when wearing N95 for work  Ref done

## 2018-12-25 NOTE — Assessment & Plan Note (Signed)
Continues omeprazole 40 mg  Has woken up with epigastric pain rad to back-that resolves once she moves around  ? If GI/acid related or if she may be swallowing air from cpap  Ref done to GI

## 2018-12-25 NOTE — Assessment & Plan Note (Signed)
Severe enough that a hysterectomy would be difficult (adhesions)  She has ongoing bleeding and gyn/surg follows closely  On megace now

## 2019-01-12 NOTE — Progress Notes (Signed)
[facility] Consultation      Assessment and Plan:  Obstructive sleep apnea. - Current CPAP machine is broken, appears to have increasing daytime sleepiness symptoms. - We will order new auto CPAP machine with pressure range 5-20.  Will order new HST if needed per insurance requirements.  Asthma, dyspnea on exertion. - Patient notes that she is able to tolerate about 1 mile of walking without difficulty, however when she wears double mask at work and ambulates she has dyspnea. - Recommend continued Symbicort, asked that she try using 2 puffs of albuterol at work before putting on her mask as this may be contributing to her dyspnea. 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  We discussed that weight loss may help.  -We will send for PFT.  Pulmonary fibrosis. 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 - Patient provided reassurance that at this time I do not see evidence of this, will order baseline chest x-ray.  GERD. - Patient wakes up with epigastric pain radiating around her chest wall to her back. - Uncertain etiology, but this could be esophagitis from GERD. - She has a follow-up appointment with gastroenterology next week which I think should be helpful in looking into this.  In the meantime we discussed avoiding eating 4 hours before bedtime, and continuing omeprazole.  Orders Placed This Encounter  Procedures  . DG Chest 2 View  . Flu Vaccine QUAD 36+ mos IM  . AMB REFERRAL FOR DME  . Pulmonary Function Test [facility] Only   Return in about 3 months (around 04/14/2019).   Date: 01/13/2019  MRN# [id number] Patty Williams November 10, 1960  Referring Physician: self referral for dyspnea and sleep apnea.   Patty Williams is a 58 y.o. old female seen in consultation for  chief complaint of:    Chief Complaint  Patient presents with  . pulmonary consult    per Dr. Glori Bickers fro OSA, asthma and family hx of pulmonary fibrosis. pt reports of sob with wearing a mask and occ with exertion. wearing cpap avg 7-8hr nightly.     HPI:  Patty Williams is a 58 y.o. old female  She was seeing Dr. Annamaria Boots in about 2014 for asthma, she had OSA on CPAP.  She is back here today because of sleep issues, she has an old CPAP machine which is broken and she is sleepy during the day.  She is using cpap every night. She goes to bed at 9 pm, wakes at 5 am. Denies sleep paralysis, cataplexy, she wears a night guard, she had TMJ. She notes that more recently she is more sleepy during the day, she drives 45 min to work.   She notes that she is waking in the AM with severe epigastric machine with radiation around to her back. She has reflux which has been controlled with omeprazole daily.   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 She has noted that when she checks her oxygen it is low, and sometimes when she walks up and down the hall she gets winded. At work she uses N95, surgical mask, and face shield.  She has gained 18 pounds over the past 4 months attributable to stress since going back  to work.   Her mother died of Pulm fibrosis, and there was a cluster in of IPF in [place].  She feels that her breathing is winded especially at work when she is active and is wearing masks. She bikes about 20 min per day stationary or walks 20 min, she does well with that, and goes at least one mile. She notices dyspnea in her office when sitting and talking to pts.  She is taking symbicort 1 puff in the morning, she uses a spacer and washer mouth.   PMHX:   Past Medical History:  Diagnosis Date  . ADD (attention deficit disorder) 12/2006   adult, pt denies  . Asthma   . Diabetes mellitus type II    mild  . Diverticulosis 2014   Mild   . Elevated blood pressure reading without diagnosis of hypertension   . Endometriosis    Severe  . Fibroids   . GERD (gastroesophageal reflux disease)   . History of ovarian cyst   . HLD (hyperlipidemia)   . Hypertension   . Iron deficiency anemia   . Obesity   . Obesity   . OSA on CPAP   . Plantar fasciitis    with orthotics-much difficulty adjusting these  . PMB (postmenopausal bleeding)   . Pneumonia    history of  . PONV (postoperative nausea and vomiting)   . Pre-diabetes   . Rectovaginal fistula   . Stress reaction   . Wears glasses    Surgical Hx:  Past Surgical History:  Procedure Laterality Date  . COLONOSCOPY  01/2009  . DILATATION & CURETTAGE/HYSTEROSCOPY WITH MYOSURE N/A 09/22/2018   Procedure: DILATATION & CURETTAGE/HYSTEROSCOPY;  Surgeon: Aloha Gell, MD;  Location: [facility];  Service: Gynecology;  Laterality: N/A;  . HEMORRHOID SURGERY    . LAPAROSCOPY     endometriosis  . UPPER GI ENDOSCOPY  01/2009  . UTERINE SUSPENSION     Family Hx:  Family History  Problem Relation Age of Onset  . Cancer Father        prostate  . Thyroid disease Mother        ?  . Diabetes Mother   . Hypertension Mother   . Heart attack Other        grandfather (in his 71's)  . Parkinsonism Other        grandfater   Social Hx:   Social History   Tobacco Use  . Smoking status: Never Smoker  . Smokeless tobacco: Never Used  Substance Use Topics  . Alcohol use: Yes    Alcohol/week: 0.0 standard drinks    Comment: social  . Drug use: No   Medication:    Current Outpatient Medications:  .  albuterol (PROVENTIL HFA) 108 (90 Base) MCG/ACT inhaler, Inhale 2 puffs into the lungs every 4 (four) hours as needed. (Patient taking differently: Inhale 2 puffs into the lungs every 4 (four) hours as needed for wheezing or shortness of breath. ), Disp: 3 Inhaler, Rfl: 1 .  budesonide-formoterol (SYMBICORT) 80-4.5 MCG/ACT inhaler, USE 2 INHALATIONS TWICE A DAY  (Patient taking differently: Inhale 1 puff into the lungs daily. ), Disp: 30.6 g, Rfl: 11 .  Cholecalciferol (VITAMIN D) 50 MCG (2000 UT) CAPS, Take 2,000 Units by mouth daily., Disp: , Rfl:  .  Ferrous Sulfate (IRON) 90 (18 Fe) MG TABS, Take 180 mg by mouth daily., Disp: , Rfl:  .  hydrocortisone (ANUSOL-HC) 25 MG suppository, Place 1 suppository (25 mg total) rectally 2 (  two) times daily. As needed for hemorrhoid, Disp: 12 suppository, Rfl: 1 .  ibuprofen (ADVIL) 800 MG tablet, Take 1 tablet (800 mg total) by mouth every 8 (eight) hours as needed for moderate pain., Disp: 30 tablet, Rfl: 0 .  losartan (COZAAR) 50 MG tablet, Take 1 tablet (50 mg total) by mouth daily., Disp: 30 tablet, Rfl: 11 .  Magnesium 400 MG CAPS, Take 400 mg by mouth daily. , Disp: , Rfl:  .  megestrol (MEGACE) 40 MG tablet, Take 1 tablet by mouth 2 (two) times daily., Disp: , Rfl:  .  Multiple Vitamin (MULTIVITAMIN) tablet, Take 1 tablet by mouth daily.  , Disp: , Rfl:  .  NONFORMULARY OR COMPOUNDED ITEM, Exercise prescription:  Personal training sessions for patient to strengthen quadriceps musculature and core stability for knee pain and knee stability. Dx: M67.52, M23.92, Disp: 1 each, Rfl: 0 .  Omega-3 Fatty Acids (FISH OIL) 1000 MG CAPS, Take 2,000 mg by mouth daily., Disp: , Rfl:  .  omeprazole (PRILOSEC) 20 MG capsule, Take 2 capsules (40 mg total) by mouth daily., Disp: 60 capsule, Rfl: 11   Allergies:  Patient has no known allergies.    Review of Systems:  Constitutional: Feels well. Cardiovascular: Denies chest pain, exertional chest pain.  Pulmonary: Denies hemoptysis, pleuritic chest pain.   The remainder of systems were reviewed and were found to be negative other than what is documented in the HPI.    Physical Examination:   VS: BP 130/70 (BP Location: Left Arm, Cuff Size: Normal)   Pulse 72   Temp (!) 97.3 F (36.3 C) (Temporal)   Ht 5\' 5"  (1.651 m)   Wt 225 lb 9.6 oz (102.3 kg)   SpO2 95%    BMI 37.54 kg/m   General Appearance: No distress  Neuro:without focal findings, mental status, speech normal, alert and oriented HEENT: PERRLA, EOM intact Pulmonary: No wheezing, No rales  CardiovascularNormal S1,S2.  No m/r/g.  Abdomen: Benign, Soft, non-tender, No masses Renal:  No costovertebral tenderness  GU:  No performed at this time. Endoc: No evident thyromegaly, no signs of acromegaly or Cushing features Skin:   warm, no rashes, no ecchymosis  Extremities: normal, no cyanosis, clubbing.    LABORATORY PANEL:   CBC No results for input(s): WBC, HGB, HCT, PLT in the last 168 hours. ------------------------------------------------------------------------------------------------------------------  Chemistries  No results for input(s): NA, K, CL, CO2, GLUCOSE, BUN, CREATININE, CALCIUM, MG, AST, ALT, ALKPHOS, BILITOT in the last 168 hours.  Invalid input(s): GFRCGP ------------------------------------------------------------------------------------------------------------------  Cardiac Enzymes No results for input(s): TROPONINI in the last 168 hours. ------------------------------------------------------------  RADIOLOGY:  No results found.     Thank  you for the consultation and for allowing [facility] to assist in the care of your patient. Our recommendations are noted above.  Please contact us if we can be of further service.   Marda Stalker, M.D., F.C.C.P.  Board Certified in Internal Medicine, Pulmonary Medicine, [place], and Sleep Medicine.  [facility] Office Number: [phone]   01/13/2019

## 2019-01-13 ENCOUNTER — Ambulatory Visit
Admission: RE | Admit: 2019-01-13 | Discharge: 2019-01-13 | Disposition: A | Discharge status: Home / Self Care | Payer: 59 | Source: Ambulatory Visit | Attending: Internal Medicine | Admitting: Internal Medicine

## 2019-01-13 ENCOUNTER — Ambulatory Visit
Admission: RE | Admit: 2019-01-13 | Discharge: 2019-01-13 | Disposition: A | Discharge status: Home / Self Care | Payer: 59 | Attending: Internal Medicine | Admitting: Internal Medicine

## 2019-01-13 ENCOUNTER — Other Ambulatory Visit: Payer: Self-pay

## 2019-01-13 ENCOUNTER — Encounter: Payer: Self-pay | Admitting: Internal Medicine

## 2019-01-13 ENCOUNTER — Ambulatory Visit (INDEPENDENT_AMBULATORY_CARE_PROVIDER_SITE_OTHER): Payer: 59 | Admitting: Internal Medicine

## 2019-01-13 VITALS — BP 130/70 | HR 72 | Temp 97.3°F | Ht 65.0 in | Wt 225.6 lb

## 2019-01-13 DIAGNOSIS — G4733 Obstructive sleep apnea (adult) (pediatric): Secondary | ICD-10-CM

## 2019-01-13 DIAGNOSIS — R0609 Other forms of dyspnea: Secondary | ICD-10-CM | POA: Diagnosis not present

## 2019-01-13 DIAGNOSIS — Z23 Encounter for immunization: Secondary | ICD-10-CM

## 2019-01-13 NOTE — Patient Instructions (Addendum)
Will start auto CPAP.  Pressure range 5-20 cm H2O. We will send for chest x-ray. Will order pulmonary function test. Continue Symbicort. Use albuterol 2 puffs at midday and/or in the morning to see if it helps with daytime symptoms. Try to avoid eating 4 hours before bedtime.  Continue omeprazole and follow-up with gastroenterology.

## 2019-01-20 ENCOUNTER — Ambulatory Visit (INDEPENDENT_AMBULATORY_CARE_PROVIDER_SITE_OTHER): Payer: 59 | Admitting: Physician Assistant

## 2019-01-20 ENCOUNTER — Encounter: Payer: Self-pay | Admitting: Physician Assistant

## 2019-01-20 VITALS — BP 142/86 | HR 71 | Temp 97.8°F | Ht 65.0 in | Wt 227.0 lb

## 2019-01-20 DIAGNOSIS — E669 Obesity, unspecified: Secondary | ICD-10-CM | POA: Diagnosis not present

## 2019-01-20 DIAGNOSIS — Z1211 Encounter for screening for malignant neoplasm of colon: Secondary | ICD-10-CM

## 2019-01-20 DIAGNOSIS — K219 Gastro-esophageal reflux disease without esophagitis: Secondary | ICD-10-CM | POA: Diagnosis not present

## 2019-01-20 DIAGNOSIS — R1013 Epigastric pain: Secondary | ICD-10-CM

## 2019-01-20 MED ORDER — OMEPRAZOLE 40 MG PO CPDR: 40.0000 mg | DELAYED_RELEASE_CAPSULE | Freq: Two times a day (BID) | ORAL | 3 refills | Status: DC

## 2019-01-20 NOTE — Progress Notes (Signed)
Subjective:    Patient ID: Patty Williams, female    DOB: 1960/12/09, 58 y.o.   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  She has recently been having issues with uterine fibroids, and had a failed removal due to tissue friability. Last colonoscopy was done September 2010 with finding of sigmoid diverticulosis and internal hemorrhoids, no polyps She also had EGD in 2010 with question of some duodenal scalloping however small bowel biopsies were unremarkable. She comes in today complaining of epigastric pain intermittently over the past year.  She said interestingly she had very little to no trouble during the time that she was out of work this spring staying home during the onset of COVID pandemic.  Since returning to work he has developed progressive symptoms and also has gained about 20 pounds since returning to work.  This is distressing her as she is gained about 7 pounds per month. She has been having regular episodes of epigastric pain that radiated through to her back at times somewhat severe usually occurring in the early morning hours about 5 AM waking her from sleep.  She has not had any of these episodes during the daytime.  At onset of an episode she will get up and walk around and usually's symptoms will eventually resolve in about 20 minutes.  These are not associated with nausea or vomiting or diarrhea.  She has had about 6 or 7 episodes in the past month.  During the daytime no significant heartburn or indigestion no belching burping, no dysphagia or diet aphasia.  She has been on 40 mg of Prilosec daily chronically which she takes in the morning.  She does takeNSAIDs, had been on 800 mg dosing at one point but now only taking 200 mg daily at bedtime . She says her partner has  been eating keto and they have been eating higher fat meals in the evening She tries not to eat for a couple of hours prior to bedtime.  No regular EtOH. She knows she is due for follow-up colonoscopy and would like to schedule.  Review of Systems Pertinent positive and negative review of systems were noted in the above HPI section.  All other review of systems was otherwise negative.  Outpatient Encounter Medications as of 01/20/2019  Medication Sig  . albuterol (PROVENTIL HFA) 108 (90 Base) MCG/ACT inhaler Inhale 2 puffs into the lungs every 4 (four) hours as needed. (Patient taking differently: Inhale 2 puffs into the lungs every 4 (four) hours as needed for wheezing or shortness of breath. )  . budesonide-formoterol (SYMBICORT) 80-4.5 MCG/ACT inhaler USE 2 INHALATIONS TWICE A DAY (Patient taking differently: Inhale 1 puff into the lungs daily. )  . Cholecalciferol (VITAMIN D) 50 MCG (2000 UT) CAPS Take 2,000 Units by mouth daily.  . Ferrous Sulfate (IRON) 90 (18 Fe) MG TABS Take 180 mg by mouth daily.  Marland Kitchen ibuprofen (ADVIL) 200 MG tablet Take 200 mg by mouth every 6 (six) hours as needed.  Marland Kitchen losartan (COZAAR) 50 MG tablet Take 1 tablet (50 mg total) by mouth daily.  . Magnesium 400 MG CAPS Take 400 mg by mouth daily.   . megestrol (MEGACE) 40 MG tablet Take 1 tablet by mouth 2 (two) times daily.  . Multiple Vitamin (MULTIVITAMIN) tablet Take 1 tablet by mouth daily.    Marland Kitchen  NONFORMULARY OR COMPOUNDED ITEM Exercise prescription:  Personal training sessions for patient to strengthen quadriceps musculature and core stability for knee pain and knee stability. Dx: X52.84, M23.92  . Omega-3 Fatty Acids (FISH OIL) 1000 MG CAPS Take 2,000 mg by mouth daily.  Marland Kitchen omeprazole (PRILOSEC) 20 MG capsule Take 2 capsules (40 mg total) by mouth daily.  Marland Kitchen ibuprofen (ADVIL) 800 MG tablet Take 1 tablet (800 mg total) by mouth every 8 (eight) hours as needed for moderate pain.  Marland Kitchen omeprazole (PRILOSEC) 40 MG capsule  Take 1 capsule (40 mg total) by mouth 2 (two) times daily. Take one before breakfast and one before dinner.  . [DISCONTINUED] hydrocortisone (ANUSOL-HC) 25 MG suppository Place 1 suppository (25 mg total) rectally 2 (two) times daily. As needed for hemorrhoid   No facility-administered encounter medications on file as of 01/20/2019.    No Known Allergies Patient Active Problem List   Diagnosis Date Noted  . Family history of pulmonary fibrosis 12/23/2018  . Family history of kidney disease 12/23/2018  . Colon cancer screening 12/23/2018  . Epigastric pain 12/23/2018  . Vaginal bleeding 09/22/2018  . Class 2 severe obesity due to excess calories with serious comorbidity and body mass index (BMI) of 36.0 to 36.9 in adult ([place]) 10/20/2015  . Current use of proton pump inhibitor 10/18/2015  . History of fracture 10/18/2015  . Left knee pain 04/19/2015  . Frequent UTI 10/06/2013  . Seasonal and perennial allergic rhinitis 01/01/2013  . Routine general medical examination at a health care facility 04/30/2011  . Prediabetes   . Plantar fasciitis   . METATARSALGIA 04/11/2010  . 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 Obstructive sleep apnea 06/10/2009  . GERD 11/09/2008  . Hyperlipidemia 07/20/2008  . Essential hypertension, benign 03/02/2008  . Mild reactive airways disease 03/29/2007  . Endometriosis 03/29/2007   Social History   Socioeconomic History  . Marital status: Married    Spouse name: Not on file  . Number of children: Not on file  . Years of education: Not on file  . Highest education level: Not on file  Occupational History  . Occupation: Pharmacist, community  Social Needs  . Financial resource strain: Not on file  . Food insecurity    Worry: Not on file    Inability: Not on file  . Transportation needs    Medical: Not on file    Non-medical: Not on file  Tobacco Use  . Smoking status: Never Smoker  . Smokeless tobacco: Never Used  Substance and Sexual Activity  .  Alcohol use: Yes    Alcohol/week: 0.0 standard drinks    Comment: rarely  . Drug use: No  . Sexual activity: Not on file  Lifestyle  . Physical activity    Days per week: Not on file    Minutes per session: Not on file  . Stress: Not on file  Relationships  . Social Herbalist on phone: Not on file    Gets together: Not on file    Attends religious service: Not on file    Active member of club or organization: Not on file    Attends meetings of clubs or organizations: Not on file    Relationship status: Not on file  . Intimate partner violence    Fear of current or ex partner: Not on file    Emotionally abused: Not on file    Physically abused: Not on file    Forced sexual activity: Not on file  Other Topics Concern  . Not on file  Social History Narrative   Permanent life partner-female    Ms. Larimer's family history includes Cancer in her father; Diabetes in her mother; Heart attack in an other family member; Hypertension in her mother; Parkinsonism in an other family member; Thyroid disease in her mother.      Objective:    Vitals:   01/20/19 0837  BP: (!) 142/86  Pulse: 71  Temp: 97.8 F (36.6 C)    Physical Exam;Well-developed well-nourished older white female  in no acute distress, pleasant  Weight, 227 BMI 87.7  HEENT; nontraumatic normocephalic, EOMI, PER R LA, sclera anicteric. Oropharynx; not examined/mask/COVID Neck; supple, no JVD Cardiovascular; regular rate and rhythm with S1-S2, no murmur rub or gallop Pulmonary; Clear bilaterally Abdomen; soft, nontender currently,, nondistended, no palpable mass or hepatosplenomegaly, bowel sounds are active Rectal; not done Skin; benign exam, no jaundice rash or appreciable lesions Extremities; no clubbing cyanosis or edema skin warm and dry Neuro/Psych; alert and oriented x4, grossly nonfocal mood and affect appropriate       Assessment & Plan:   #66 58 year old female with history of chronic  GERD maintained on omeprazole 40 mg every morning, now with 4 to 56-monthhistory of episodic very early morning epigastric pain radiating to the back with 6 or 7 episodes over the past month.  Rule out possible peptic ulcer disease, NSAID induced, rule out gastropathy, rule out poorly controlled GERD with component of distal esophagitis. Consider gallbladder disease though timing of symptoms very atypical  Patient had labs August 2020 with CBC and c-Met unremarkable  #2 colon cancer surveillance-: 2010 due for follow-up #3 diverticulosis #4 history of severe endometriosis with chronic colovaginal fistula #5 uterine fibroids #6 recent weight gain  Plan; strict antireflux regimen, and antireflux diet reviewed and patient was given eScientist, clinical (histocompatibility and immunogenetics) Discussed n.p.o. for 2 to 3 hours prior to bedtime and elevation of the head of the bed. Stop ibuprofen at bedtime, if she feels she has to have ibuprofen advised taking earlier in the day with food and try to minimize as much as possible Increase omeprazole to 40 mg p.o. twice daily at least short-term until symptoms better controlled. We will schedule for Colonoscopy and EGD with Dr. GCarlean Purl  Both procedures were discussed in detail with the patient including indications risks and benefits and she is agreeable to proceed. She requested referral for weight management and [facility] B[facility]afy Ro  Aryon Nham S Torri Langston PA-C 01/20/2019   Cc: Tower, MWynelle Fanny MD

## 2019-01-20 NOTE — Patient Instructions (Signed)
If you are age 59 or older, your body mass index should be between 23-30. Your Body mass index is 37.77 kg/m. If this is out of the aforementioned range listed, please consider follow up with your Primary Care Provider.  If you are age 42 or younger, your body mass index should be between 19-25. Your Body mass index is 37.77 kg/m. If this is out of the aformentioned range listed, please consider follow up with your Primary Care Provider.   You have been scheduled for an endoscopy and colonoscopy. Please follow the written instructions given to you at your visit today. Please pick up your prep supplies at the pharmacy within the next 1-3 days. If you use inhalers (even only as needed), please bring them with you on the day of your procedure. Your physician has requested that you go to www.startemmi.com and enter the access code given to you at your visit today. This web site gives a general overview about your procedure. However, you should still follow specific instructions given to you by our office regarding your preparation for the procedure.  We have sent the following medications to your pharmacy for you to pick up at your convenience: Omeprazole 40 mg twice daily.  MINIMIZE NSAIDS  You have been given anti-reflux regimen.  You are being referred to Nutrition/Weight Management - Dr. Leafy Ro.  Thank you for choosing me and [facility].   Amy Esterwood, PA-C

## 2019-01-25 ENCOUNTER — Telehealth: Payer: Self-pay | Admitting: Internal Medicine

## 2019-01-25 NOTE — Telephone Encounter (Signed)
Sent CM to [place] at [facility] as to status of order. LMOVM for Sleep Lab to attempt to locate the prior SS.  LMOVM for pt to return my call to [phone]. Rhonda J Cobb

## 2019-01-26 NOTE — Telephone Encounter (Signed)
Per Sonia Baller at [facility]. Pt was a former patient of [facility].  Sonia Baller located old sleep study from 2008.  Order has been pulled to process since SS located and pt will be contacted to arrange CPAP Set up. Rhonda J Cobb

## 2019-01-26 NOTE — Telephone Encounter (Signed)
Spoke with patient this morning and advised that [facility] located her 2008 Sleep Study and can proceed with processing this New CPAP order.  Advised patient that [facility] will be calling to schedule appointment for CPAP Set up at the [facility].  Pt expressed her appreciation. Nothing else needed at this time. Rhonda J Cobb

## 2019-02-02 ENCOUNTER — Telehealth: Payer: Self-pay | Admitting: Internal Medicine

## 2019-02-02 DIAGNOSIS — G4733 Obstructive sleep apnea (adult) (pediatric): Secondary | ICD-10-CM

## 2019-02-02 NOTE — Telephone Encounter (Signed)
Called and spoke to Wel[facility] with [facility]. Butch Penny is requesting that pt's pressure be changed 8-20cm. I have received a verbal from [facility] to change settings. Order has been corrected and set to [facility].  Nothing further is needed.

## 2019-02-17 ENCOUNTER — Telehealth: Payer: Self-pay | Admitting: Physician Assistant

## 2019-02-17 NOTE — Telephone Encounter (Signed)
Peter Congo have you already sent the referral?

## 2019-02-17 NOTE — Telephone Encounter (Signed)
Pt states that she has not heard from referral about weight mgmt yet.

## 2019-02-22 NOTE — Telephone Encounter (Signed)
Called the patient. No answer. I left a message for the patient. I have sent her written information through her My Chart account. "I spoke with the office of [facility]. Spoke with the referral coordinator Dawn. Very nice lady. She explained to me the appointments are being scheduled into January. She is working through a list from July and moving forward. There will be a new doctor starting in November. A tip she gave me was to encourage you to answer the phone as much as you are able. Look out for her number. She has to move forward with scheduling. She tells me a lot of people are not answering/returning her calls. So here is the address and phone number of the office.   [facility] [address] [place], [place] [address] [phone]  I hope this is helpful to you. Stay healthy."

## 2019-02-24 ENCOUNTER — Encounter: Payer: Self-pay | Admitting: Family Medicine

## 2019-03-03 ENCOUNTER — Encounter: Payer: Self-pay | Admitting: Internal Medicine

## 2019-03-10 ENCOUNTER — Ambulatory Visit (AMBULATORY_SURGERY_CENTER): Payer: 59 | Admitting: Internal Medicine

## 2019-03-10 ENCOUNTER — Other Ambulatory Visit: Payer: Self-pay | Admitting: Internal Medicine

## 2019-03-10 ENCOUNTER — Other Ambulatory Visit: Payer: Self-pay

## 2019-03-10 ENCOUNTER — Encounter: Payer: Self-pay | Admitting: Internal Medicine

## 2019-03-10 VITALS — BP 142/83 | HR 52 | Temp 98.0°F | Resp 21 | Ht 65.0 in | Wt 227.0 lb

## 2019-03-10 DIAGNOSIS — D123 Benign neoplasm of transverse colon: Secondary | ICD-10-CM

## 2019-03-10 DIAGNOSIS — K219 Gastro-esophageal reflux disease without esophagitis: Secondary | ICD-10-CM

## 2019-03-10 DIAGNOSIS — Z1211 Encounter for screening for malignant neoplasm of colon: Secondary | ICD-10-CM | POA: Diagnosis not present

## 2019-03-10 MED ORDER — SODIUM CHLORIDE 0.9 % IV SOLN: 500.0000 mL | INTRAVENOUS | Status: DC

## 2019-03-10 MED ORDER — PANTOPRAZOLE SODIUM 40 MG PO TBEC: 40.0000 mg | DELAYED_RELEASE_TABLET | Freq: Two times a day (BID) | ORAL | 1 refills | Status: DC

## 2019-03-10 NOTE — Op Note (Addendum)
[place] Patient Name: Patty Williams Procedure Date: 03/10/2019 1:31 PM MRN: [id number] Endoscopist: Gatha Mayer , MD Age: 58 Referring MD:  Date of Birth: [birth year] Gender: Female Account #: [id number] Procedure:                Upper GI endoscopy Indications:              Esophageal reflux symptoms that persist despite                            appropriate therapy Medicines:                Propofol per Anesthesia, Monitored Anesthesia Care Procedure:                Pre-Anesthesia Assessment:                           - Prior to the procedure, a History and Physical                            was performed, and patient medications and                            allergies were reviewed. The patient's tolerance of                            previous anesthesia was also reviewed. The risks                            and benefits of the procedure and the sedation                            options and risks were discussed with the patient.                            All questions were answered, and informed consent                            was obtained. Prior Anticoagulants: The patient has                            taken no previous anticoagulant or antiplatelet                            agents. ASA Grade Assessment: III - A patient with                            severe systemic disease. After reviewing the risks                            and benefits, the patient was deemed in                            satisfactory condition to undergo the procedure.  After obtaining informed consent, the endoscope was                            passed under direct vision. Throughout the                            procedure, the patient's blood pressure, pulse, and                            oxygen saturations were monitored continuously. The                            Endoscope was introduced through the mouth, and                            advanced  to the second part of duodenum. The upper                            GI endoscopy was accomplished without difficulty.                            The patient tolerated the procedure well. Scope In: Scope Out: Findings:                 The esophagus was normal.                           The stomach was normal.                           The examined duodenum was normal.                           The cardia and gastric fundus were normal on                            retroflexion. Hill grade 2 GE junction Complications:            No immediate complications. Estimated Blood Loss:     Estimated blood loss: none. Impression:               - Normal esophagus.                           - Normal stomach.                           - Normal examined duodenum.                           - No specimens collected. Recommendation:           - Patient has a contact number available for                            emergencies. The signs and symptoms of potential  delayed complications were discussed with the                            patient. Return to normal activities tomorrow.                            Written discharge instructions were provided to the                            patient.                           - Weight loss - low carb.                           - Continue present medications.                           - Change PPI from oemprazole bid to pantoprazole                            bid to see if headaches resolve Gatha Mayer, MD 03/10/2019 2:30:58 PM This report has been signed electronically.

## 2019-03-10 NOTE — Progress Notes (Signed)
Called to room to assist during endoscopic procedure.  Patient ID and intended procedure confirmed with present staff. Received instructions for my participation in the procedure from the performing physician.  

## 2019-03-10 NOTE — Op Note (Signed)
[place] Patient Name: Patty Williams Procedure Date: 03/10/2019 1:31 PM MRN: [id number] Endoscopist: Gatha Mayer , MD Age: 58 Referring MD:  Date of Birth: [birth year] Gender: Female Account #: [id number] Procedure:                Colonoscopy Indications:              Screening for colorectal malignant neoplasm Medicines:                Propofol per Anesthesia, Monitored Anesthesia Care Procedure:                Pre-Anesthesia Assessment:                           - Prior to the procedure, a History and Physical                            was performed, and patient medications and                            allergies were reviewed. The patient's tolerance of                            previous anesthesia was also reviewed. The risks                            and benefits of the procedure and the sedation                            options and risks were discussed with the patient.                            All questions were answered, and informed consent                            was obtained. Prior Anticoagulants: The patient has                            taken no previous anticoagulant or antiplatelet                            agents. ASA Grade Assessment: III - A patient with                            severe systemic disease. After reviewing the risks                            and benefits, the patient was deemed in                            satisfactory condition to undergo the procedure.                           After obtaining informed consent, the colonoscope  was passed under direct vision. Throughout the                            procedure, the patient's blood pressure, pulse, and                            oxygen saturations were monitored continuously. The                            Colonoscope was introduced through the anus and                            advanced to the the cecum, identified by     appendiceal orifice and ileocecal valve. The                            colonoscopy was performed without difficulty. The                            patient tolerated the procedure well. The quality                            of the bowel preparation was good. The bowel                            preparation used was Miralax via split dose                            instruction. The ileocecal valve, appendiceal                            orifice, and rectum were photographed. Scope In: 1:50:22 PM Scope Out: 2:06:43 PM Scope Withdrawal Time: 0 hours 11 minutes 58 seconds  Total Procedure Duration: 0 hours 16 minutes 21 seconds  Findings:                 A 8 mm polyp was found in the distal transverse                            colon. The polyp was semi-pedunculated. The polyp                            was removed with a hot snare. Resection and                            retrieval were complete. Verification of patient                            identification for the specimen was done. Estimated                            blood loss: none.                           Multiple  diverticula were found in the sigmoid                            colon.                           The exam was otherwise without abnormality on                            direct and retroflexion views. Complications:            No immediate complications. Estimated Blood Loss:     Estimated blood loss: none. Impression:               - One 8 mm polyp in the distal transverse colon,                            removed with a hot snare. Resected and retrieved.                           - Moderate diverticulosis in the sigmoid colon.                           - The examination was otherwise normal on direct                            and retroflexion views. Recommendation:           - Patient has a contact number available for                            emergencies. The signs and symptoms of potential                             delayed complications were discussed with the                            patient. Return to normal activities tomorrow.                            Written discharge instructions were provided to the                            patient.                           - Weight loss.                           - Continue present medications.                           - No aspirin, ibuprofen, naproxen, or other                            non-steroidal anti-inflammatory drugs for 2 weeks  after polyp removal.                           - Repeat colonoscopy is recommended for                            surveillance. The colonoscopy date will be                            determined after pathology results from today's                            exam become available for review. Gatha Mayer, MD 03/10/2019 2:33:59 PM This report has been signed electronically.

## 2019-03-10 NOTE — Progress Notes (Signed)
To PACU, VSS. Report to [email or web address] 

## 2019-03-10 NOTE — Patient Instructions (Addendum)
The upper endoscopy looks ok.  I think the weight gain aggravated your reflux. I am including some nutrition and weight loss recommendations for you to check out. The types of foods that are out there and that we like can be hard to stop eating - we can get addicted - it can be difficult to remove from the diet but it can be done.  I know you are waiting to be seen in weight management clinic but if you check out the websites I have listed you can get started.  I am changing the omeprazole to pantoprazole to treat GER and hopefully the headaches that could be from omeprazole will go away. Daytime headache is also a sign of sleep apnea - may need to have CPAP reassessed. Weight gain may have affected this also and weight loss will help.  There was one colon polyp and some diverticulosis. No signs of a fistula. The polyp looks benign but precancerous and will probably lead to a 7 year recall.  I appreciate the opportunity to care for you. Gatha Mayer, MD, [facility]  NUTRITION ADVICE  Healthy and nutritious eating and weight loss are made to be harder than they need to be. A simplified diet approach based around eating normally, as much as you want without restricting intake too much is better, I think. You must avoid packaged foods and try to eat real foods as much as possible. Packaged foods, sugary sodas, highly processed foods taste great but are slow poisons that lead to obesity and/or poor health.  Some resources that I like are:  www.gaplesinstitute.org (Do the learning modules about healthy eating)  www.dietdoctor.com - helps with low carb diets and also can learn about and consider intermittent  fasting. If you have diabetes would not do intermittent fasting without checking with your doctor. Best to change what you eat before doing this.  Www.drberry.com   I recommend you check your blood sugar daily and keep a log. If you are diabetic and check sugars.  Here are some guidelines to  help you with meal planning -  Avoid all processed and packaged foods (bread, pasta, crackers, chips, etc) and beverages containing calories.  Avoid added sugars and excessive natural sugars.  Attention to how you feel if you consume artificial sweeteners.  Do they make you more hungry or raise your blood sugar?  With every meal and snack, aim to get 20 g of protein (3 ounces of meat, 4 ounces of fish, 3 eggs, protein powder, 1 cup [place] yogurt, 1 cup cottage cheese, etc.)  Increase fiber in the form of non-starchy vegetables.  These help you feel full with very little carbohydrates and are good for gut health.  Eat 1 serving healthy carb per meal- 1/2 cup brown rice, beans, potato, corn- pay attention to whether or not this significantly raises your blood sugar if you are checking blood sugars. If it does, reduce the frequency you consume these.   Eat 2-3 servings of lower sugar fruits daily.  This includes berries, apples, oranges, peaches, pears, one half banana.  Have small amounts of good fats such as avocado, nuts, olive oil, nut butters, olives.  Add a little cheese to your salads to make them tasty.   YOU HAD AN ENDOSCOPIC PROCEDURE TODAY AT [facility]:   Refer to the procedure report that was given to you for any specific questions about what was found during the examination.  If the procedure report does not answer your  questions, please call your gastroenterologist to clarify.  If you requested that your care partner not be given the details of your procedure findings, then the procedure report has been included in a sealed envelope for you to review at your convenience later.  YOU SHOULD EXPECT: Some feelings of bloating in the abdomen. Passage of more gas than usual.  Walking can help get rid of the air that was put into your GI tract during the procedure and reduce the bloating. If you had a lower endoscopy (such as a colonoscopy or flexible sigmoidoscopy) you may  notice spotting of blood in your stool or on the toilet paper. If you underwent a bowel prep for your procedure, you may not have a normal bowel movement for a few days.  Please Note:  You might notice some irritation and congestion in your nose or some drainage.  This is from the oxygen used during your procedure.  There is no need for concern and it should clear up in a day or so.  SYMPTOMS TO REPORT IMMEDIATELY:   Following lower endoscopy (colonoscopy or flexible sigmoidoscopy):  Excessive amounts of blood in the stool  Significant tenderness or worsening of abdominal pains  Swelling of the abdomen that is new, acute  Fever of 100F or higher   Following upper endoscopy (EGD)  Vomiting of blood or coffee ground material  New chest pain or pain under the shoulder blades  Painful or persistently difficult swallowing  New shortness of breath  Fever of 100F or higher  Black, tarry-looking stools  For urgent or emergent issues, a gastroenterologist can be reached at any hour by calling [phone].   DIET:  We do recommend a small meal at first, but then you may proceed to your regular diet.  Drink plenty of fluids but you should avoid alcoholic beverages for 24 hours.  ACTIVITY:  You should plan to take it easy for the rest of today and you should NOT DRIVE or use heavy machinery until tomorrow (because of the sedation medicines used during the test).    FOLLOW UP: Our staff will call the number listed on your records 48-72 hours following your procedure to check on you and address any questions or concerns that you may have regarding the information given to you following your procedure. If we do not reach you, we will leave a message.  We will attempt to reach you two times.  During this call, we will ask if you have developed any symptoms of COVID 19. If you develop any symptoms (ie: fever, flu-like symptoms, shortness of breath, cough etc.) before then, please call  [phone].  If you test positive for Covid 19 in the 2 weeks post procedure, please call and report this information to [place].    If any biopsies were taken you will be contacted by phone or by letter within the next 1-3 weeks.  Please call us at [phone] if you have not heard about the biopsies in 3 weeks.    SIGNATURES/CONFIDENTIALITY: You and/or your care partner have signed paperwork which will be entered into your electronic medical record.  These signatures attest to the fact that that the information above on your After Visit Summary has been reviewed and is understood.  Full responsibility of the confidentiality of this discharge information lies with you and/or your care-partner.

## 2019-03-14 ENCOUNTER — Telehealth: Payer: Self-pay

## 2019-03-14 NOTE — Telephone Encounter (Signed)
  Follow up Call-  Call back number 03/10/2019  Post procedure Call Back phone  # [phone]  Permission to leave phone message Yes  Some recent data might be hidden     Patient questions:  Do you have a fever, pain , or abdominal swelling? No. Pain Score  0 *  Have you tolerated food without any problems? Yes.    Have you been able to return to your normal activities? Yes.    Do you have any questions about your discharge instructions: Diet   No. Medications  No. Follow up visit  No.  Do you have questions or concerns about your Care? No.  Actions: * If pain score is 4 or above: No action needed, pain <4.  1. Have you developed a fever since your procedure? no  2.   Have you had an respiratory symptoms (SOB or cough) since your procedure? no  3.   Have you tested positive for COVID 19 since your procedure no  4.   Have you had any family members/close contacts diagnosed with the COVID 19 since your procedure?  no   If yes to any of these questions please route to Joylene John, RN and Alphonsa Gin, Therapist, sports.

## 2019-03-24 ENCOUNTER — Encounter: Payer: Self-pay | Admitting: Internal Medicine

## 2019-03-24 DIAGNOSIS — Z860101 Personal history of adenomatous and serrated colon polyps: Secondary | ICD-10-CM

## 2019-03-24 DIAGNOSIS — Z86010 Personal history of colonic polyps: Secondary | ICD-10-CM

## 2019-03-24 HISTORY — DX: Personal history of adenomatous and serrated colon polyps: Z86.0101

## 2019-03-24 HISTORY — DX: Personal history of colonic polyps: Z86.010

## 2019-04-12 ENCOUNTER — Telehealth: Payer: Self-pay | Admitting: Internal Medicine

## 2019-04-12 NOTE — Telephone Encounter (Signed)
Pt called back and R/S PFT to Thurs 06/29/2019 at 8:00 with COVID Test on Wed 06/28/2019 between 8:00 am - 11:00 am at the [facility]. Stay in car. Pt is aware of PFT and COVID date and will call at the end of December to schedule F/U appointment with DK. Rhonda J Cobb Nothing else needed at this time. Rhonda J Cobb

## 2019-04-12 NOTE — Telephone Encounter (Signed)
Pt was scheduled for PFT on Thurs 04/20/2019. Due to pt's work schedule she is unable to have COVID test on 04/19/2019.  PFT's are booked out until Feb 2021 for a 8:00 appointment.  Pt did not R/S b/c we could not accommodate her schedule.    Just FYI for physician. Rhonda J Cobb

## 2019-04-17 ENCOUNTER — Telehealth: Payer: Self-pay | Admitting: Internal Medicine

## 2019-04-17 NOTE — Telephone Encounter (Signed)
ATC pt to relay date/time of covid test for pft on 04/20/2019, however it appears that PFT has been rescheduled. Will close encounter.

## 2019-04-17 NOTE — Telephone Encounter (Signed)
covid test can not be scheduled this far in advance. covid test will be day prior to schedule PFT.  Left message to make pt aware of this information.

## 2019-04-19 ENCOUNTER — Other Ambulatory Visit: Payer: 59

## 2019-04-20 ENCOUNTER — Ambulatory Visit: Payer: 59

## 2019-04-20 ENCOUNTER — Ambulatory Visit: Payer: 59 | Admitting: Internal Medicine

## 2019-04-21 ENCOUNTER — Ambulatory Visit: Payer: 59 | Admitting: Internal Medicine

## 2019-06-05 ENCOUNTER — Other Ambulatory Visit: Payer: Self-pay

## 2019-06-05 MED ORDER — PANTOPRAZOLE SODIUM 40 MG PO TBEC: 40.0000 mg | DELAYED_RELEASE_TABLET | Freq: Two times a day (BID) | ORAL | 1 refills | Status: DC

## 2019-06-05 NOTE — Telephone Encounter (Signed)
Pantoprazole refilled as patient was told to continue at her procedure.

## 2019-06-12 ENCOUNTER — Other Ambulatory Visit: Payer: Self-pay | Admitting: Family Medicine

## 2019-06-29 ENCOUNTER — Ambulatory Visit: Payer: 59

## 2019-06-29 ENCOUNTER — Ambulatory Visit: Payer: 59 | Admitting: Pulmonary Disease

## 2019-07-26 ENCOUNTER — Telehealth: Payer: Self-pay

## 2019-07-26 NOTE — Telephone Encounter (Signed)
Left VM letting pt know Dr. Marliss Coots comments and if possible bring BP cuff to appt tomorrow

## 2019-07-26 NOTE — Telephone Encounter (Addendum)
This morning at 1:30 AM pt took BP which was 140/100; pt said she has been under stress and this is not the first time BP has been elevated. Pt has not had H/A,dizziness, CP or SOB. Pt rechecked BP now at 11:15 AM and BP 132/94 P 73. Pt already has 30' appt to see Dr Glori Bickers on 07/27/19 at [facility]; Shirlean Mylar has already done covid screening which was negative. Pt has not missed taking her losartan 50 mg one daily. UC & ED precautions given and pt voiced understanding.

## 2019-07-26 NOTE — Telephone Encounter (Signed)
It is more accurate to check BP when relaxed - but sounds like it may be staying up.  Please tell her to bring her cuff to the appt so we can check accuracy and I will see her then

## 2019-07-27 ENCOUNTER — Ambulatory Visit (INDEPENDENT_AMBULATORY_CARE_PROVIDER_SITE_OTHER): Payer: 59 | Admitting: Family Medicine

## 2019-07-27 ENCOUNTER — Ambulatory Visit: Payer: 59 | Admitting: Family Medicine

## 2019-07-27 ENCOUNTER — Encounter: Payer: Self-pay | Admitting: Family Medicine

## 2019-07-27 ENCOUNTER — Other Ambulatory Visit: Payer: Self-pay

## 2019-07-27 VITALS — BP 142/95 | HR 74 | Temp 97.3°F | Ht 65.0 in | Wt 227.2 lb

## 2019-07-27 DIAGNOSIS — I10 Essential (primary) hypertension: Secondary | ICD-10-CM | POA: Diagnosis not present

## 2019-07-27 MED ORDER — LOSARTAN POTASSIUM 100 MG PO TABS: 100.0000 mg | ORAL_TABLET | Freq: Every day | ORAL | 3 refills | Status: DC

## 2019-07-27 NOTE — Progress Notes (Signed)
Subjective:    Patient ID: Patty Williams, female    DOB: 12-Jan-1961, 59 y.o.   MRN: [id number]  This visit occurred during the SARS-CoV-2 public health emergency.  Safety protocols were in place, including screening questions prior to the visit, additional usage of staff PPE, and extensive cleaning of exam room while observing appropriate contact time as indicated for disinfecting solutions.    HPI Pt presents with BP concerns  Wt Readings from Last 3 Encounters:  07/27/19 227 lb 3 oz (103.1 kg)  03/10/19 227 lb (103 kg)  01/20/19 227 lb (103 kg)   37.81 kg/m    HTN BP Readings from Last 3 Encounters:  07/27/19 128/90  03/10/19 (!) 142/83  01/20/19 (!) 142/86   She takes losartan 50 mg  Re check BP: (!) 142/95     Pulse Readings from Last 3 Encounters:  07/27/19 74  03/10/19 (!) 52  01/20/19 71   She was in a study and A1C went down to 5.3  Used libre meter and it was really helpful   She has been tracking BP since November  140s/100s    (occ lower in 130s/90s)   Woke up at 1:30 in the am - dreaming she had a stroke  That happens occasionally  Not getting quality sleep    For the last 2 weeks very stressed  Short staffed  Just found out a staff member needs to go part time   Exercise - when she can  Usually 5-6 days per week/ stationary bike   Lab Results  Component Value Date   CREATININE 0.65 12/16/2018   BUN 23 12/16/2018   NA 141 12/16/2018   K 3.7 12/16/2018   CL 109 12/16/2018   CO2 21 12/16/2018   Patient Active Problem List   Diagnosis Date Noted  . Hx of adenomatous polyp of colon 03/24/2019  . Family history of pulmonary fibrosis 12/23/2018  . Family history of kidney disease 12/23/2018  . Colon cancer screening 12/23/2018  . Epigastric pain 12/23/2018  . Vaginal bleeding 09/22/2018  . Class 2 severe obesity due to excess calories with serious comorbidity and body mass index (BMI) of 36.0 to 36.9 in adult ([facility]) 10/20/2015  .  Current use of proton pump inhibitor 10/18/2015  . History of fracture 10/18/2015  . Left knee pain 04/19/2015  . Frequent UTI 10/06/2013  . Seasonal and perennial allergic rhinitis 01/01/2013  . Routine general medical examination at a health care facility 04/30/2011  . Prediabetes   . Plantar fasciitis   . METATARSALGIA 04/11/2010  . 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 Obstructive sleep apnea 06/10/2009  . GERD 11/09/2008  . Hyperlipidemia 07/20/2008  . Essential hypertension, benign 03/02/2008  . Mild reactive airways disease 03/29/2007  . Endometriosis 03/29/2007   Past Medical History:  Diagnosis Date  . ADD (attention deficit disorder) 12/2006   adult, pt denies  . Asthma   . Diabetes mellitus type II    mild  . Diverticulosis 2014   Mild  . Elevated blood pressure reading without diagnosis of hypertension   . Endometriosis    Severe  . Fibroids   . GERD (gastroesophageal reflux disease)   . History of ovarian cyst   . HLD (hyperlipidemia)   . Hx of adenomatous polyp of colon 03/24/2019  . Hypertension   . Iron deficiency anemia   . Obesity   . Obesity   . OSA on CPAP   . Plantar fasciitis    with  orthotics-much difficulty adjusting these  . PMB (postmenopausal bleeding)   . Pneumonia    history of  . PONV (postoperative nausea and vomiting)   . Pre-diabetes   . Rectovaginal fistula   . Sleep apnea   . Stress reaction   . Wears glasses    Past Surgical History:  Procedure Laterality Date  . COLONOSCOPY  01/2009  . DILATATION & CURETTAGE/HYSTEROSCOPY WITH MYOSURE N/A 09/22/2018   Procedure: DILATATION & CURETTAGE/HYSTEROSCOPY;  Surgeon: Aloha Gell, MD;  Location: [facility];  Service: Gynecology;  Laterality: N/A;  . HEMORRHOID SURGERY    . LAPAROSCOPY     endometriosis  . TONSILLECTOMY AND ADENOIDECTOMY    . UPPER GI ENDOSCOPY  01/2009  . UTERINE SUSPENSION    . WISDOM TOOTH EXTRACTION     Social History   Tobacco Use    . Smoking status: Never Smoker  . Smokeless tobacco: Never Used  Substance Use Topics  . Alcohol use: Yes    Alcohol/week: 0.0 standard drinks    Comment: rarely  . Drug use: No   Family History  Problem Relation Age of Onset  . Cancer Father        prostate  . Thyroid disease Mother        ?  . Diabetes Mother   . Hypertension Mother   . Heart attack Other        grandfather (in his 20's)  . Parkinsonism Other        grandfater   No Known Allergies Current Outpatient Medications on File Prior to Visit  Medication Sig Dispense Refill  . albuterol (PROVENTIL HFA) 108 (90 Base) MCG/ACT inhaler Inhale 2 puffs into the lungs every 4 (four) hours as needed. 3 Inhaler 1  . budesonide-formoterol (SYMBICORT) 80-4.5 MCG/ACT inhaler USE 2 INHALATIONS TWICE A DAY 30.6 g 1  . Cholecalciferol (VITAMIN D) 50 MCG (2000 UT) CAPS Take 2,000 Units by mouth daily.    Marland Kitchen ibuprofen (ADVIL) 200 MG tablet Take 200 mg by mouth every 6 (six) hours as needed.    . Magnesium 400 MG CAPS Take 400 mg by mouth daily.     . Multiple Vitamin (MULTIVITAMIN) tablet Take 1 tablet by mouth daily.      . NONFORMULARY OR COMPOUNDED ITEM Exercise prescription:  Personal training sessions for patient to strengthen quadriceps musculature and core stability for knee pain and knee stability. Dx: M67.52, M23.92 1 each 0  . Omega-3 Fatty Acids (FISH OIL) 1000 MG CAPS Take 2,000 mg by mouth daily.    . pantoprazole (PROTONIX) 40 MG tablet Take 1 tablet (40 mg total) by mouth 2 (two) times daily before a meal. Breakfast and supper 180 tablet 1   No current facility-administered medications on file prior to visit.     Review of Systems  Constitutional: Negative for activity change, appetite change, fatigue, fever and unexpected weight change.  HENT: Negative for congestion, ear pain, rhinorrhea, sinus pressure and sore throat.   Eyes: Negative for pain, redness and visual disturbance.  Respiratory: Negative for cough,  shortness of breath and wheezing.   Cardiovascular: Negative for chest pain and palpitations.  Gastrointestinal: Negative for abdominal pain, blood in stool, constipation and diarrhea.  Endocrine: Negative for polydipsia and polyuria.  Genitourinary: Negative for dysuria, frequency and urgency.  Musculoskeletal: Negative for arthralgias, back pain and myalgias.  Skin: Negative for pallor and rash.  Allergic/Immunologic: Negative for environmental allergies.  Neurological: Negative for dizziness, syncope and headaches.  Hematological: Negative  for adenopathy. Does not bruise/bleed easily.  Psychiatric/Behavioral: Negative for decreased concentration and dysphoric mood. The patient is not nervous/anxious.        Very stressed       Objective:   Physical Exam Constitutional:      General: She is not in acute distress.    Appearance: Normal appearance. She is well-developed. She is obese. She is not ill-appearing.  HENT:     Head: Normocephalic and atraumatic.  Eyes:     General: No scleral icterus.    Conjunctiva/sclera: Conjunctivae normal.     Pupils: Pupils are equal, round, and reactive to light.  Neck:     Thyroid: No thyromegaly.     Vascular: No carotid bruit or JVD.  Cardiovascular:     Rate and Rhythm: Normal rate and regular rhythm.     Heart sounds: Normal heart sounds. No gallop.   Pulmonary:     Effort: Pulmonary effort is normal. No respiratory distress.     Breath sounds: Normal breath sounds. No wheezing or rales.     Comments: Good air exch Abdominal:     General: Bowel sounds are normal. There is no distension or abdominal bruit.     Palpations: Abdomen is soft. There is no mass.     Tenderness: There is no abdominal tenderness.  Musculoskeletal:     Cervical back: Normal range of motion and neck supple.     Right lower leg: No edema.     Left lower leg: No edema.  Lymphadenopathy:     Cervical: No cervical adenopathy.  Skin:    General: Skin is warm and  dry.     Findings: No rash.  Neurological:     Mental Status: She is alert.     Coordination: Coordination normal.     Deep Tendon Reflexes: Reflexes are normal and symmetric. Reflexes normal.  Psychiatric:        Mood and Affect: Mood normal.           Assessment & Plan:   Problem List Items Addressed This Visit      Cardiovascular and Mediastinum   Essential hypertension, benign - Primary    BP is elevated with stress/ weight  Plan to increase losartan to 100 mg  May add hctz if needed later  Reassuring exam Discussed self care and DASH diet   Pt will call in 1-2 weeks wit BP readings       Relevant Medications   losartan (COZAAR) 100 MG tablet

## 2019-07-27 NOTE — Patient Instructions (Addendum)
Keep working on fluids  Stay as close as possible to the DASH eating plan   Increase losartan to 100 mg once daily   Keep exercising and working on weight loss   Let me know how blood pressures are doing in the next 2 weeks (mychart is fine) If side effects or problems- contact me sooner

## 2019-07-27 NOTE — Assessment & Plan Note (Signed)
BP is elevated with stress/ weight  Plan to increase losartan to 100 mg  May add hctz if needed later  Reassuring exam Discussed self care and DASH diet   Pt will call in 1-2 weeks wit BP readings

## 2019-08-04 ENCOUNTER — Encounter: Payer: Self-pay | Admitting: Internal Medicine

## 2019-08-04 ENCOUNTER — Ambulatory Visit (INDEPENDENT_AMBULATORY_CARE_PROVIDER_SITE_OTHER): Payer: 59 | Admitting: Internal Medicine

## 2019-08-04 ENCOUNTER — Ambulatory Visit: Payer: 59 | Admitting: Internal Medicine

## 2019-08-04 ENCOUNTER — Other Ambulatory Visit: Payer: Self-pay

## 2019-08-04 DIAGNOSIS — G4733 Obstructive sleep apnea (adult) (pediatric): Secondary | ICD-10-CM | POA: Diagnosis not present

## 2019-08-04 DIAGNOSIS — F5101 Primary insomnia: Secondary | ICD-10-CM

## 2019-08-04 DIAGNOSIS — K219 Gastro-esophageal reflux disease without esophagitis: Secondary | ICD-10-CM | POA: Diagnosis not present

## 2019-08-04 DIAGNOSIS — Z836 Family history of other diseases of the respiratory system: Secondary | ICD-10-CM | POA: Diagnosis not present

## 2019-08-04 DIAGNOSIS — G4700 Insomnia, unspecified: Secondary | ICD-10-CM | POA: Insufficient documentation

## 2019-08-04 NOTE — Assessment & Plan Note (Signed)
CXR 5 months ago was clear. Exam is clear, without crackles, clubbing, jvd. Plan- Suggest annual CXR

## 2019-08-04 NOTE — Assessment & Plan Note (Signed)
Discussed ways to elevate head of be.  Plan- consider trying adjustable bed

## 2019-08-04 NOTE — Progress Notes (Signed)
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 Occasional mask leak. Likes to sleep with pillow over head.  Recognizes work-related stress impacting sleep with frequent WASO in last 2 weeks. No problem falling asleep. At least once woke coughing with panic attack and recognizes role of GERD. Tried HOB up on brick- slid down. Eats earlier now.  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 No hx connective tissue disease. Not much asthma- has rescue hfa if needed. CXR 01/13/2019- IMPRESSION: No active cardiopulmonary disease.  ROS-see HPI   + = positive Constitutional:    weight loss, night sweats, fevers, chills, fatigue, lassitude. HEENT:    headaches, difficulty swallowing, tooth/dental problems, sore throat,       sneezing, itching, ear ache, nasal congestion, post nasal drip, snoring CV:    chest pain, orthopnea, PND, swelling in lower extremities, anasarca,                                  dizziness, palpitations Resp:   +shortness of breath with exertion or at rest.                productive cough,   non-productive cough, coughing up of blood.              change in color of mucus.  wheezing.   Skin:    rash or lesions. GI:    +heartburn, indigestion, abdominal pain, nausea, vomiting, diarrhea,                 change in bowel habits, loss of appetite GU: dysuria, change in color of urine, no urgency or frequency,  flank pain. MS:    joint pain, stiffness, decreased range of motion, back pain. Neuro-     nothing unusual Psych:  change in mood or affect.  depression or anxiety.   memory loss.  OBJ- Physical Exam General- Alert, Oriented, Affect-appropriate, Distress- none acute, + overweight Skin- rash-none, lesions- none, excoriation- none Lymphadenopathy- none Head- atraumatic            Eyes- Gross vision intact, PERRLA, conjunctivae and secretions clear            Ears- Hearing, canals-normal            Nose- Clear, no-Septal dev, mucus, polyps, erosion, perforation             Throat- Mallampati II-III , mucosa clear , drainage- none, tonsils- atrophic Neck- flexible , trachea midline, no stridor , thyroid nl, carotid no bruit Chest - symmetrical excursion , unlabored           Heart/CV- RRR , no murmur , no gallop  , no rub, nl s1 s2                           - JVD- none , edema- none, stasis changes- none, varices- none  Lung- clear to P&A- no crackles, wheeze- none, cough- none , dullness-none, rub- none           Chest wall-  Abd-  Br/ Gen/ Rectal- Not done, not indicated Extrem- cyanosis- none, clubbing, none, atrophy- none, strength- nl Neuro- grossly intact to observation

## 2019-08-04 NOTE — Patient Instructions (Signed)
Script sent to try temazepam 15 mg, 1-2 caps at bedtime for sleep as needed  Consider an adjustable bed to help with reflux.  We can continue CPAP auto 8-20. If you decide you would like to try, we can lower the upper pressure limit some.  Consider a chest xray once a year or so for ILD surveillance  Please call if we can help

## 2019-08-04 NOTE — Assessment & Plan Note (Signed)
Benefits from CPAP with excellent compliance and control Plan - Continue CPAP auto 8-20

## 2019-08-04 NOTE — Assessment & Plan Note (Signed)
Recent difficulty maintaining sleep. 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 Discussed somatic comfort and relation to CPAP and GED. Plan- try occasional temazepam if needed

## 2019-08-07 ENCOUNTER — Telehealth: Payer: Self-pay | Admitting: Internal Medicine

## 2019-08-07 NOTE — Telephone Encounter (Signed)
Pharm calling on Temazepam 15 mg rx  Pt seen today and AVS states:  Script sent to try temazepam 15 mg, 1-2 caps at bedtime for sleep as needed  Consider an adjustable bed to help with reflux.  We can continue CPAP auto 8-20. If you decide you would like to try, we can lower the upper pressure limit some.  Consider a chest xray once a year or so for ILD surveillance  Please call if we can help ------------------------------------------------------------------------------------------------------  Rx was not sent Please advise, thanks!

## 2019-08-08 MED ORDER — TEMAZEPAM 15 MG PO CAPS: ORAL_CAPSULE | ORAL | 5 refills | Status: DC

## 2019-08-08 NOTE — Telephone Encounter (Signed)
Temazepam script e-sent to [facility]

## 2019-11-13 ENCOUNTER — Encounter: Payer: Self-pay | Admitting: Family Medicine

## 2019-12-21 ENCOUNTER — Telehealth: Payer: Self-pay | Admitting: Family Medicine

## 2019-12-21 DIAGNOSIS — E7800 Pure hypercholesterolemia, unspecified: Secondary | ICD-10-CM

## 2019-12-21 DIAGNOSIS — I10 Essential (primary) hypertension: Secondary | ICD-10-CM

## 2019-12-21 DIAGNOSIS — R7303 Prediabetes: Secondary | ICD-10-CM

## 2019-12-21 NOTE — Telephone Encounter (Signed)
-----   Message from Cloyd Stagers, RT sent at 12/07/2019  1:35 PM EDT ----- Regarding: Lab Orders for Friday 8.13.2021 Please place lab orders for Friday 8.13.2021, office visit for physical on Friday 8.27.2021 Thank you, Dyke Maes RT(R)

## 2019-12-22 ENCOUNTER — Other Ambulatory Visit (INDEPENDENT_AMBULATORY_CARE_PROVIDER_SITE_OTHER): Payer: 59

## 2019-12-22 ENCOUNTER — Other Ambulatory Visit: Payer: Self-pay

## 2019-12-22 DIAGNOSIS — E7800 Pure hypercholesterolemia, unspecified: Secondary | ICD-10-CM

## 2019-12-22 DIAGNOSIS — I10 Essential (primary) hypertension: Secondary | ICD-10-CM | POA: Diagnosis not present

## 2019-12-22 DIAGNOSIS — R7303 Prediabetes: Secondary | ICD-10-CM

## 2019-12-22 LAB — HEMOGLOBIN A1C: Hgb A1c MFr Bld: 6.5 % (ref 4.6–6.5)

## 2019-12-22 LAB — LIPID PANEL
Cholesterol: 193 mg/dL (ref 0–200)
HDL: 44.1 mg/dL (ref >39.00)
LDL Cholesterol: 127 mg/dL — ABNORMAL HIGH (ref 0–99)
NonHDL: 148.41
Total CHOL/HDL Ratio: 4
Triglycerides: 105 mg/dL (ref 0.0–149.0)
VLDL: 21 mg/dL (ref 0.0–40.0)

## 2019-12-22 LAB — COMPREHENSIVE METABOLIC PANEL
ALT: 38 U/L — ABNORMAL HIGH (ref 0–35)
AST: 23 U/L (ref 0–37)
Albumin: 4.1 g/dL (ref 3.5–5.2)
Alkaline Phosphatase: 100 U/L (ref 39–117)
BUN: 21 mg/dL (ref 6–23)
CO2: 31 mEq/L (ref 19–32)
Calcium: 9.3 mg/dL (ref 8.4–10.5)
Chloride: 102 mEq/L (ref 96–112)
Creatinine, Ser: 0.75 mg/dL (ref 0.40–1.20)
GFR: 78.89 mL/min (ref >60.00)
Glucose, Bld: 121 mg/dL — ABNORMAL HIGH (ref 70–99)
Potassium: 4.3 mEq/L (ref 3.5–5.1)
Sodium: 141 mEq/L (ref 135–145)
Total Bilirubin: 0.4 mg/dL (ref 0.2–1.2)
Total Protein: 6.8 g/dL (ref 6.0–8.3)

## 2019-12-22 LAB — CBC WITH DIFFERENTIAL/PLATELET
Basophils Absolute: 0 10*3/uL (ref 0.0–0.1)
Basophils Relative: 0.6 % (ref 0.0–3.0)
Eosinophils Absolute: 0.1 10*3/uL (ref 0.0–0.7)
Eosinophils Relative: 1.4 % (ref 0.0–5.0)
HCT: 36.9 % (ref 36.0–46.0)
Hemoglobin: 12.1 g/dL (ref 12.0–15.0)
Lymphocytes Relative: 34.8 % (ref 12.0–46.0)
Lymphs Abs: 2.6 10*3/uL (ref 0.7–4.0)
MCHC: 32.8 g/dL (ref 30.0–36.0)
MCV: 87.7 fl (ref 78.0–100.0)
Monocytes Absolute: 0.7 10*3/uL (ref 0.1–1.0)
Monocytes Relative: 9.9 % (ref 3.0–12.0)
Neutro Abs: 4 10*3/uL (ref 1.4–7.7)
Neutrophils Relative %: 53.3 % (ref 43.0–77.0)
Platelets: 278 10*3/uL (ref 150.0–400.0)
RBC: 4.2 Mil/uL (ref 3.87–5.11)
RDW: 15.4 % (ref 11.5–15.5)
WBC: 7.5 10*3/uL (ref 4.0–10.5)

## 2019-12-22 LAB — TSH: TSH: 1.76 u[IU]/mL (ref 0.35–4.50)

## 2019-12-29 ENCOUNTER — Encounter: Payer: 59 | Admitting: Family Medicine

## 2020-01-05 ENCOUNTER — Encounter: Payer: 59 | Admitting: Family Medicine

## 2020-01-19 ENCOUNTER — Ambulatory Visit (INDEPENDENT_AMBULATORY_CARE_PROVIDER_SITE_OTHER): Payer: 59 | Admitting: Family Medicine

## 2020-01-19 ENCOUNTER — Other Ambulatory Visit: Payer: Self-pay | Admitting: Family Medicine

## 2020-01-19 ENCOUNTER — Other Ambulatory Visit: Payer: Self-pay

## 2020-01-19 VITALS — BP 126/72 | HR 72 | Temp 96.8°F | Ht 64.75 in | Wt 221.2 lb

## 2020-01-19 DIAGNOSIS — I10 Essential (primary) hypertension: Secondary | ICD-10-CM

## 2020-01-19 DIAGNOSIS — Z23 Encounter for immunization: Secondary | ICD-10-CM | POA: Diagnosis not present

## 2020-01-19 DIAGNOSIS — R7303 Prediabetes: Secondary | ICD-10-CM

## 2020-01-19 DIAGNOSIS — Z0000 Encounter for general adult medical examination without abnormal findings: Secondary | ICD-10-CM | POA: Diagnosis not present

## 2020-01-19 DIAGNOSIS — Z6836 Body mass index (BMI) 36.0-36.9, adult: Secondary | ICD-10-CM

## 2020-01-19 DIAGNOSIS — E7800 Pure hypercholesterolemia, unspecified: Secondary | ICD-10-CM | POA: Diagnosis not present

## 2020-01-19 NOTE — Patient Instructions (Addendum)
Let's re check lipids and A1C in 3 months   Try to get most of your carbohydrates from produce (with the exception of white potatoes)  Eat less bread/pasta/rice/snack foods/cereals/sweets and other items from the middle of the grocery store (processed carbs) Avoid red meat/ fried foods/ egg yolks/ fatty breakfast meats/ butter, cheese and high fat dairy/ and shellfish    Look into the NOOM program for emotional eating/weight issues    I will work on a referral to nutritionist and the office will call you

## 2020-01-19 NOTE — Progress Notes (Signed)
Subjective:    Patient ID: Patty Williams, female    DOB: 15-May-1960, 59 y.o.   MRN: [id number]  This visit occurred during the SARS-CoV-2 public health emergency.  Safety protocols were in place, including screening questions prior to the visit, additional usage of staff PPE, and extensive cleaning of exam room while observing appropriate contact time as indicated for disinfecting solutions.    HPI Here for health maintenance exam and to review chronic medical problems    Wt Readings from Last 3 Encounters:  01/19/20 221 lb 3 oz (100.3 kg)  08/04/19 227 lb (103 kg)  07/27/19 227 lb 3 oz (103.1 kg)  working on diet to loose weight  Tries to exercise in the ams  37.09 kg/m   Some stress -in relationship and work  Investment banker, corporate a Social worker   Has trigger thumb-planning to get a release   Is interested in selling the practice and doing some teaching   Taking care of herself  Getting massages Going to chiropractor-hip and back problems   No time for fitness   She had a tooth extracted - grinding teeth and broke one 2 night guards  Seeing Dr Annamaria Boots for her cpap    covid immunized  Had shingrix vaccine  Flu shot today  3/20 Tdap   Saw gyn in February for bleeding  Dx with inflammatory issues  Endometriosis  As had hysteroscopy  Pap every 3 y    Mammogram 10/20 (already scheduled) Self breast exam - no lumps   Colonoscopy 10/20   bp is stable today  No cp or palpitations or headaches or edema  No side effects to medicines  BP Readings from Last 3 Encounters:  01/19/20 110/70  08/04/19 (!) 140/92  07/27/19 (!) 142/95     Pulse Readings from Last 3 Encounters:  01/19/20 72  08/04/19 74  07/27/19 74   Hyperlipidemia Lab Results  Component Value Date   CHOL 193 12/22/2019   CHOL 160 12/16/2018   CHOL 154 12/10/2017   Lab Results  Component Value Date   HDL 44.10 12/22/2019   HDL 37.70 (L) 12/16/2018   HDL 47.80 12/10/2017   Lab Results  Component Value  Date   LDLCALC 127 (H) 12/22/2019   LDLCALC 100 (H) 12/16/2018   [place] 95 12/10/2017   Lab Results  Component Value Date   TRIG 105.0 12/22/2019   TRIG 115.0 12/16/2018   TRIG 54.0 12/10/2017   Lab Results  Component Value Date   CHOLHDL 4 12/22/2019   CHOLHDL 4 12/16/2018   CHOLHDL 3 12/10/2017   Lab Results  Component Value Date   LDLDIRECT 144.2 09/21/2008  HDL and LDL went up  Interested in taking tumeric    Prediabetes Lab Results  Component Value Date   HGBA1C 6.5 12/22/2019  was 6.3 a year ago  Patient Active Problem List   Diagnosis Date Noted  . Insomnia 08/04/2019  . Hx of adenomatous polyp of colon 03/24/2019  . Family history of pulmonary fibrosis 12/23/2018  . Family history of kidney disease 12/23/2018  . Colon cancer screening 12/23/2018  . Epigastric pain 12/23/2018  . Vaginal bleeding 09/22/2018  . Class 2 severe obesity due to excess calories with serious comorbidity and body mass index (BMI) of 36.0 to 36.9 in adult ([place]) 10/20/2015  . Current use of proton pump inhibitor 10/18/2015  . History of fracture 10/18/2015  . Left knee pain 04/19/2015  . Frequent UTI 10/06/2013  . Seasonal and perennial allergic rhinitis  01/01/2013  . Routine general medical examination at a health care facility 04/30/2011  . Prediabetes   . Plantar fasciitis   . METATARSALGIA 04/11/2010  . 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 Obstructive sleep apnea 06/10/2009  . GERD 11/09/2008  . Hyperlipidemia 07/20/2008  . Essential hypertension, benign 03/02/2008  . Mild reactive airways disease 03/29/2007  . Endometriosis 03/29/2007   Past Medical History:  Diagnosis Date  . ADD (attention deficit disorder) 12/2006   adult, pt denies  . Asthma   . Diabetes mellitus type II    mild  . Diverticulosis 2014   Mild  . Elevated blood pressure reading without diagnosis of hypertension   . Endometriosis    Severe  . Fibroids   . GERD (gastroesophageal reflux  disease)   . History of ovarian cyst   . HLD (hyperlipidemia)   . Hx of adenomatous polyp of colon 03/24/2019  . Hypertension   . Iron deficiency anemia   . Obesity   . Obesity   . OSA on CPAP   . Plantar fasciitis    with orthotics-much difficulty adjusting these  . PMB (postmenopausal bleeding)   . Pneumonia    history of  . PONV (postoperative nausea and vomiting)   . Pre-diabetes   . Rectovaginal fistula   . Sleep apnea   . Stress reaction   . Wears glasses    Past Surgical History:  Procedure Laterality Date  . COLONOSCOPY  01/2009  . DILATATION & CURETTAGE/HYSTEROSCOPY WITH MYOSURE N/A 09/22/2018   Procedure: DILATATION & CURETTAGE/HYSTEROSCOPY;  Surgeon: Aloha Gell, MD;  Location: [facility];  Service: Gynecology;  Laterality: N/A;  . HEMORRHOID SURGERY    . LAPAROSCOPY     endometriosis  . TONSILLECTOMY AND ADENOIDECTOMY    . UPPER GI ENDOSCOPY  01/2009  . UTERINE SUSPENSION    . WISDOM TOOTH EXTRACTION     Social History   Tobacco Use  . Smoking status: Never Smoker  . Smokeless tobacco: Never Used  Vaping Use  . Vaping Use: Never used  Substance Use Topics  . Alcohol use: Yes    Alcohol/week: 0.0 standard drinks    Comment: rarely  . Drug use: No   Family History  Problem Relation Age of Onset  . Cancer Father        prostate  . Thyroid disease Mother        ?  . Diabetes Mother   . Hypertension Mother   . Heart attack Other        grandfather (in his 72's)  . Parkinsonism Other        grandfater   No Known Allergies Current Outpatient Medications on File Prior to Visit  Medication Sig Dispense Refill  . albuterol (PROVENTIL HFA) 108 (90 Base) MCG/ACT inhaler Inhale 2 puffs into the lungs every 4 (four) hours as needed. 3 Inhaler 1  . amoxicillin (AMOXIL) 875 MG tablet Take 875 mg by mouth 2 (two) times daily.    . budesonide-formoterol (SYMBICORT) 80-4.5 MCG/ACT inhaler USE 2 INHALATIONS TWICE A DAY 30.6 g 1  .  Cholecalciferol (VITAMIN D) 50 MCG (2000 UT) CAPS Take 2,000 Units by mouth daily.    Marland Kitchen ibuprofen (ADVIL) 200 MG tablet Take 200 mg by mouth every 6 (six) hours as needed.    Marland Kitchen losartan (COZAAR) 100 MG tablet Take 1 tablet (100 mg total) by mouth daily. 90 tablet 3  . Magnesium 400 MG CAPS Take 400 mg by mouth daily.     Marland Kitchen  Multiple Vitamin (MULTIVITAMIN) tablet Take 1 tablet by mouth daily.      . NONFORMULARY OR COMPOUNDED ITEM Exercise prescription:  Personal training sessions for patient to strengthen quadriceps musculature and core stability for knee pain and knee stability. Dx: M67.52, M23.92 1 each 0  . Omega-3 Fatty Acids (FISH OIL) 1000 MG CAPS Take 2,000 mg by mouth daily.    . pantoprazole (PROTONIX) 40 MG tablet Take 1 tablet (40 mg total) by mouth 2 (two) times daily before a meal. Breakfast and supper 180 tablet 1  . temazepam (RESTORIL) 15 MG capsule 1 or 2 for sleep as needed 60 capsule 5   No current facility-administered medications on file prior to visit.     Review of Systems  Constitutional: Positive for fatigue. Negative for activity change, appetite change, fever and unexpected weight change.  HENT: Negative for congestion, ear pain, rhinorrhea, sinus pressure and sore throat.   Eyes: Negative for pain, redness and visual disturbance.  Respiratory: Negative for cough, shortness of breath and wheezing.   Cardiovascular: Negative for chest pain and palpitations.  Gastrointestinal: Negative for abdominal pain, blood in stool, constipation and diarrhea.  Endocrine: Negative for polydipsia and polyuria.  Genitourinary: Negative for dysuria, frequency and urgency.  Musculoskeletal: Positive for arthralgias. Negative for back pain, joint swelling and myalgias.  Skin: Negative for pallor and rash.  Allergic/Immunologic: Negative for environmental allergies.  Neurological: Negative for dizziness, syncope and headaches.  Hematological: Negative for adenopathy. Does not  bruise/bleed easily.  Psychiatric/Behavioral: Negative for decreased concentration and dysphoric mood. The patient is nervous/anxious.        Stressors       Objective:   Physical Exam Constitutional:      General: She is not in acute distress.    Appearance: Normal appearance. She is well-developed. She is obese. She is not ill-appearing or diaphoretic.  HENT:     Head: Normocephalic and atraumatic.     Right Ear: Tympanic membrane, ear canal and external ear normal.     Left Ear: Tympanic membrane, ear canal and external ear normal.     Nose: Nose normal. No congestion.     Mouth/Throat:     Mouth: Mucous membranes are moist.     Pharynx: Oropharynx is clear. No posterior oropharyngeal erythema.  Eyes:     General: No scleral icterus.    Extraocular Movements: Extraocular movements intact.     Conjunctiva/sclera: Conjunctivae normal.     Pupils: Pupils are equal, round, and reactive to light.  Neck:     Thyroid: No thyromegaly.     Vascular: No carotid bruit or JVD.  Cardiovascular:     Rate and Rhythm: Normal rate and regular rhythm.     Pulses: Normal pulses.     Heart sounds: Normal heart sounds. No gallop.   Pulmonary:     Effort: Pulmonary effort is normal. No respiratory distress.     Breath sounds: Normal breath sounds. No wheezing.     Comments: Good air exch Chest:     Chest wall: No tenderness.  Abdominal:     General: Bowel sounds are normal. There is no distension or abdominal bruit.     Palpations: Abdomen is soft. There is no mass.     Tenderness: There is no abdominal tenderness.     Hernia: No hernia is present.  Genitourinary:    Comments: Pelvic and breast exam done by gyn  Musculoskeletal:        General: No tenderness. Normal  range of motion.     Cervical back: Normal range of motion and neck supple. No rigidity. No muscular tenderness.     Right lower leg: No edema.     Left lower leg: No edema.     Comments: No kyphosis   Lymphadenopathy:      Cervical: No cervical adenopathy.  Skin:    General: Skin is warm and dry.     Coloration: Skin is not pale.     Findings: No erythema or rash.     Comments: Few lentigines and angiomas  Neurological:     Mental Status: She is alert. Mental status is at baseline.     Cranial Nerves: No cranial nerve deficit.     Motor: No abnormal muscle tone.     Coordination: Coordination normal.     Gait: Gait normal.     Deep Tendon Reflexes: Reflexes are normal and symmetric. Reflexes normal.  Psychiatric:        Mood and Affect: Mood normal.        Cognition and Memory: Cognition and memory normal.     Comments: Pleasant            Assessment & Plan:   Problem List Items Addressed This Visit      Cardiovascular and Mediastinum   Essential hypertension, benign    bp in fair control at this time  BP Readings from Last 1 Encounters:  01/19/20 126/72   No changes needed Most recent labs reviewed  Disc lifstyle change with low sodium diet and exercise          Other   Hyperlipidemia    Disc goals for lipids and reasons to control them Rev last labs with pt Rev low sat fat diet in detail LDL and HDL both went up -no change in ratio  Would benefit from statin-is hesitant  Wants to try better diet and tumeric first Re check in 3 mo      Prediabetes    A1C is up to 6.5 now-right on border  disc imp of low glycemic diet and wt loss to prevent DM2  Nutritionist ref made Plans to start exercising  Re check in 3 mo      Routine general medical examination at a health care facility - Primary    Reviewed health habits including diet and exercise and skin cancer prevention Reviewed appropriate screening tests for age  Also reviewed health mt list, fam hx and immunization status , as well as social and family history   See HPI Labs reviewed  Flu shot given  covid immunized  Working on wt loss  Mammogram is scheduled  Disc addition of exercise and self care       Class 2  severe obesity due to excess calories with serious comorbidity and body mass index (BMI) of 36.0 to 36.9 in adult [facility])    Discussed how this problem influences overall health and the risks it imposes  Reviewed plan for weight loss with lower calorie diet (via better food choices and also portion control or program like weight watchers) and exercise building up to or more than 30 minutes 5 days per week including some aerobic activity   Recommend NOOM program to target emotional eating issues  Ref made to nutritionist to help with diet  Made plan to add exercise        Other Visit Diagnoses    Need for influenza vaccination       Relevant Orders  Flu Vaccine QUAD 36+ mos IM (Completed)

## 2020-01-21 ENCOUNTER — Encounter: Payer: Self-pay | Admitting: Family Medicine

## 2020-01-21 NOTE — Assessment & Plan Note (Signed)
A1C is up to 6.5 now-right on border  disc imp of low glycemic diet and wt loss to prevent DM2  Nutritionist ref made Plans to start exercising  Re check in 3 mo

## 2020-01-21 NOTE — Assessment & Plan Note (Addendum)
Disc goals for lipids and reasons to control them Rev last labs with pt Rev low sat fat diet in detail LDL and HDL both went up -no change in ratio  Would benefit from statin-is hesitant  Wants to try better diet and tumeric first Re check in 3 mo

## 2020-01-21 NOTE — Assessment & Plan Note (Signed)
Reviewed health habits including diet and exercise and skin cancer prevention Reviewed appropriate screening tests for age  Also reviewed health mt list, fam hx and immunization status , as well as social and family history   See HPI Labs reviewed  Flu shot given  covid immunized  Working on wt loss  Mammogram is scheduled  Disc addition of exercise and self care

## 2020-01-21 NOTE — Assessment & Plan Note (Signed)
Discussed how this problem influences overall health and the risks it imposes  Reviewed plan for weight loss with lower calorie diet (via better food choices and also portion control or program like weight watchers) and exercise building up to or more than 30 minutes 5 days per week including some aerobic activity   Recommend NOOM program to target emotional eating issues  Ref made to nutritionist to help with diet  Made plan to add exercise

## 2020-01-21 NOTE — Assessment & Plan Note (Signed)
bp in fair control at this time  BP Readings from Last 1 Encounters:  01/19/20 126/72   No changes needed Most recent labs reviewed  Disc lifstyle change with low sodium diet and exercise

## 2020-01-22 NOTE — Telephone Encounter (Signed)
Noted. Referral cancelled for now

## 2020-03-01 ENCOUNTER — Encounter: Payer: Self-pay | Admitting: Family Medicine

## 2020-03-08 ENCOUNTER — Ambulatory Visit: Payer: 59 | Attending: Internal Medicine

## 2020-03-08 DIAGNOSIS — Z23 Encounter for immunization: Secondary | ICD-10-CM

## 2020-03-08 NOTE — Progress Notes (Signed)
   Covid-19 Vaccination Clinic  Name:  ARYANNAH MOHON    MRN: [id number] DOB: Jul 04, 1960  03/08/2020  Ms. Cannedy was observed post Covid-19 immunization for 15 minutes without incident. She was provided with Vaccine Information Sheet and instruction to access the V-Safe system.   Ms. Langlais was instructed to call 911 with any severe reactions post vaccine: Marland Kitchen Difficulty breathing  . Swelling of face and throat  . A fast heartbeat  . A bad rash all over body  . Dizziness and weakness

## 2020-03-15 ENCOUNTER — Encounter: Payer: Self-pay | Admitting: Family Medicine

## 2020-03-15 ENCOUNTER — Ambulatory Visit (INDEPENDENT_AMBULATORY_CARE_PROVIDER_SITE_OTHER): Payer: 59 | Admitting: Family Medicine

## 2020-03-15 ENCOUNTER — Other Ambulatory Visit: Payer: Self-pay | Admitting: Internal Medicine

## 2020-03-15 ENCOUNTER — Other Ambulatory Visit: Payer: Self-pay

## 2020-03-15 VITALS — BP 142/78 | HR 69 | Temp 96.9°F | Ht 64.75 in | Wt 224.2 lb

## 2020-03-15 DIAGNOSIS — R197 Diarrhea, unspecified: Secondary | ICD-10-CM | POA: Diagnosis not present

## 2020-03-15 NOTE — Progress Notes (Signed)
Subjective:    Patient ID: Patty Williams, female    DOB: 03-17-1961, 59 y.o.   MRN: [id number]  This visit occurred during the SARS-CoV-2 public health emergency.  Safety protocols were in place, including screening questions prior to the visit, additional usage of staff PPE, and extensive cleaning of exam room while observing appropriate contact time as indicated for disinfecting solutions.    HPI Pt presents with chronic GI complaints   Wt Readings from Last 3 Encounters:  03/15/20 224 lb 4 oz (101.7 kg)  01/19/20 221 lb 3 oz (100.3 kg)  08/04/19 227 lb (103 kg)   37.61 kg/m  Sept 10 she had to take amoxicillin  Then thumb surgery after that - and had to take cephalexin  Developed diarrhea  Never really went diarrhea   2 d ago- had explosive episode of diarrhea (immodium helped)  No blood in stool  Sometimes stool is like pure water otherwise sediment like  Usually has bm every 24 to 36 h   No pain /no cramps  Lots of growling  Some gas issues /flatus   Worse with fatty foods or a big amt of carbs   She has not taken a probiotic  Tried yogurt and it did not help    Taking iron to boost that - not turning stool black  Colonoscopy in 10/20 showed moderate diverticulosis and one polyp   She takes ppi for GERD   Patient Active Problem List   Diagnosis Date Noted   Intermittent diarrhea 03/15/2020   Insomnia 08/04/2019   Hx of adenomatous polyp of colon 03/24/2019   Family history of pulmonary fibrosis 12/23/2018   Family history of kidney disease 12/23/2018   Colon cancer screening 12/23/2018   Epigastric pain 12/23/2018   Vaginal bleeding 09/22/2018   Class 2 severe obesity due to excess calories with serious comorbidity and body mass index (BMI) of 36.0 to 36.9 in adult ([place]) 10/20/2015   Current use of proton pump inhibitor 10/18/2015   History of fracture 10/18/2015   Left knee pain 04/19/2015   Frequent UTI 10/06/2013   Seasonal and  perennial allergic rhinitis 01/01/2013   Routine general medical examination at a health care facility 04/30/2011   Prediabetes    Plantar fasciitis    METATARSALGIA 04/11/2010   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   Obstructive sleep apnea 06/10/2009   GERD 11/09/2008   Hyperlipidemia 07/20/2008   Essential hypertension, benign 03/02/2008   Mild reactive airways disease 03/29/2007   Endometriosis 03/29/2007   Past Medical History:  Diagnosis Date   ADD (attention deficit disorder) 12/2006   adult, pt denies   Asthma    Diabetes mellitus type II    mild   Diverticulosis 2014   Mild   Elevated blood pressure reading without diagnosis of hypertension    Endometriosis    Severe   Fibroids    GERD (gastroesophageal reflux disease)    History of ovarian cyst    HLD (hyperlipidemia)    Hx of adenomatous polyp of colon 03/24/2019   Hypertension    Iron deficiency anemia    Obesity    Obesity    OSA on CPAP    Plantar fasciitis    with orthotics-much difficulty adjusting these   PMB (postmenopausal bleeding)    Pneumonia    history of   PONV (postoperative nausea and vomiting)    Pre-diabetes    Rectovaginal fistula    Sleep apnea    Stress reaction  Wears glasses    Past Surgical History:  Procedure Laterality Date   COLONOSCOPY  01/2009   DILATATION & CURETTAGE/HYSTEROSCOPY WITH MYOSURE N/A 09/22/2018   Procedure: DILATATION & CURETTAGE/HYSTEROSCOPY;  Surgeon: Aloha Gell, MD;  Location: [facility];  Service: Gynecology;  Laterality: N/A;   HEMORRHOID SURGERY     LAPAROSCOPY     endometriosis   TONSILLECTOMY AND ADENOIDECTOMY     UPPER GI ENDOSCOPY  01/2009   UTERINE SUSPENSION     WISDOM TOOTH EXTRACTION     Social History   Tobacco Use   Smoking status: Never Smoker   Smokeless tobacco: Never Used  Vaping Use   Vaping Use: Never used  Substance Use Topics   Alcohol use: Yes     Alcohol/week: 0.0 standard drinks    Comment: rarely   Drug use: No   Family History  Problem Relation Age of Onset   Cancer Father        prostate   Thyroid disease Mother        ?   Diabetes Mother    Hypertension Mother    Heart attack Other        grandfather (in his 73's)   Parkinsonism Other        grandfater   No Known Allergies Current Outpatient Medications on File Prior to Visit  Medication Sig Dispense Refill   albuterol (PROVENTIL HFA) 108 (90 Base) MCG/ACT inhaler Inhale 2 puffs into the lungs every 4 (four) hours as needed. 3 Inhaler 1   budesonide-formoterol (SYMBICORT) 80-4.5 MCG/ACT inhaler USE 2 INHALATIONS TWICE A DAY 30.6 g 1   Cholecalciferol (VITAMIN D) 50 MCG (2000 UT) CAPS Take 2,000 Units by mouth daily.     ibuprofen (ADVIL) 200 MG tablet Take 200 mg by mouth every 6 (six) hours as needed.     losartan (COZAAR) 100 MG tablet Take 1 tablet (100 mg total) by mouth daily. 90 tablet 3   Magnesium 400 MG CAPS Take 400 mg by mouth daily.      Multiple Vitamin (MULTIVITAMIN) tablet Take 1 tablet by mouth daily.       NONFORMULARY OR COMPOUNDED ITEM Exercise prescription:  Personal training sessions for patient to strengthen quadriceps musculature and core stability for knee pain and knee stability. Dx: M67.52, M23.92 1 each 0   Omega-3 Fatty Acids (FISH OIL) 1000 MG CAPS Take 2,000 mg by mouth daily.     temazepam (RESTORIL) 15 MG capsule 1 or 2 for sleep as needed 60 capsule 5   amoxicillin (AMOXIL) 875 MG tablet Take 875 mg by mouth 2 (two) times daily. (Patient not taking: Reported on 03/15/2020)     No current facility-administered medications on file prior to visit.     Review of Systems  Constitutional: Negative for activity change, appetite change, fatigue, fever and unexpected weight change.  HENT: Negative for congestion, ear pain, rhinorrhea, sinus pressure and sore throat.   Eyes: Negative for pain, redness and visual disturbance.   Respiratory: Negative for cough, shortness of breath and wheezing.   Cardiovascular: Negative for chest pain and palpitations.  Gastrointestinal: Positive for diarrhea. Negative for abdominal distention, abdominal pain, anal bleeding, blood in stool, constipation, nausea, rectal pain and vomiting.  Endocrine: Negative for polydipsia and polyuria.  Genitourinary: Negative for dysuria, frequency and urgency.  Musculoskeletal: Negative for arthralgias, back pain and myalgias.  Skin: Negative for pallor and rash.  Allergic/Immunologic: Negative for environmental allergies.  Neurological: Negative for dizziness, syncope and headaches.  Hematological: Negative for adenopathy. Does not bruise/bleed easily.  Psychiatric/Behavioral: Negative for decreased concentration and dysphoric mood. The patient is not nervous/anxious.        Objective:   Physical Exam Constitutional:      General: She is not in acute distress.    Appearance: Normal appearance. She is well-developed. She is obese. She is not ill-appearing or diaphoretic.  HENT:     Head: Normocephalic and atraumatic.  Eyes:     General: No scleral icterus.    Conjunctiva/sclera: Conjunctivae normal.     Pupils: Pupils are equal, round, and reactive to light.  Cardiovascular:     Rate and Rhythm: Normal rate and regular rhythm.     Heart sounds: Normal heart sounds.  Pulmonary:     Effort: Pulmonary effort is normal. No respiratory distress.     Breath sounds: Normal breath sounds. No wheezing or rales.  Abdominal:     General: Bowel sounds are normal. There is no distension.     Palpations: Abdomen is soft. There is no mass.     Tenderness: There is no abdominal tenderness. There is no right CVA tenderness, left CVA tenderness, guarding or rebound.     Hernia: No hernia is present.  Musculoskeletal:     Cervical back: Normal range of motion and neck supple.  Lymphadenopathy:     Cervical: No cervical adenopathy.  Skin:     General: Skin is warm and dry.     Coloration: Skin is not pale.     Findings: No erythema or rash.  Neurological:     Mental Status: She is alert.  Psychiatric:        Mood and Affect: Mood normal.           Assessment & Plan:   Problem List Items Addressed This Visit      Other   Intermittent diarrhea - Primary    Suspect rxn from recent antibiotics , on top of IBS Disc trial of probiotic and /or yogurt  Also fiber supplement -citrucel over the counter once daily  immodium prn if needed Update if not starting to improve in a week or if worsening

## 2020-03-15 NOTE — Patient Instructions (Addendum)
Get citrucel over the counter - mix with fluid as directed and take once daily   Eat lots of fruits and veggies  Avoid things that flare you   Get Align probiotic over the counter  Take as directed   Take it for at least a month   Yogurt is fine as well   immodium is fine as needed   Try to drink lots of fluids   If worse or no improvement let us know  If you develop constant watery stool, cramping or more mucous in stool please let us know

## 2020-03-17 NOTE — Assessment & Plan Note (Signed)
Suspect rxn from recent antibiotics , on top of IBS Disc trial of probiotic and /or yogurt  Also fiber supplement -citrucel over the counter once daily  immodium prn if needed Update if not starting to improve in a week or if worsening

## 2020-04-02 ENCOUNTER — Telehealth: Payer: Self-pay

## 2020-04-02 NOTE — Telephone Encounter (Signed)
Pt request proof of receiving flu vaccine on 01/19/2020 to email [email or web address]; copy of immunization record given to Raquel Sarna, front office manager and she will email to pt.

## 2020-04-19 ENCOUNTER — Other Ambulatory Visit: Payer: 59

## 2020-04-26 ENCOUNTER — Ambulatory Visit: Payer: 59 | Admitting: Family Medicine

## 2020-05-07 DIAGNOSIS — J849 Interstitial pulmonary disease, unspecified: Secondary | ICD-10-CM

## 2020-05-07 NOTE — Telephone Encounter (Signed)
Dr. Maple Hudson please advise on patient mychart message     Dr Maple Hudson. You asked me to schedule another CXR in Feb 2022. They need an order in Epic. Planning to get it done in [place] at [facility]. The indication was follow up for any Pulmonary Fibrosis - yearly checkup. FYI my insurance renews in March. It is preferable for me to get the CXR in Feb. thank you!

## 2020-05-07 NOTE — Telephone Encounter (Signed)
Please order CXR   Dx ILD     To be done at [facility]

## 2020-05-20 ENCOUNTER — Encounter: Payer: Self-pay | Admitting: Family Medicine

## 2020-05-27 ENCOUNTER — Other Ambulatory Visit: Payer: Self-pay | Admitting: Internal Medicine

## 2020-05-27 ENCOUNTER — Other Ambulatory Visit: Payer: Self-pay | Admitting: Family Medicine

## 2020-05-29 ENCOUNTER — Telehealth: Payer: Self-pay | Admitting: *Deleted

## 2020-05-29 MED ORDER — OLMESARTAN MEDOXOMIL 40 MG PO TABS: 40.0000 mg | ORAL_TABLET | Freq: Every day | ORAL | 3 refills | Status: DC

## 2020-05-29 NOTE — Telephone Encounter (Signed)
I sent Benicar to her pharmacy If not covered or any side effects let me know

## 2020-05-29 NOTE — Telephone Encounter (Signed)
Received fax from [employer] store saying there is a Magazine features editor back order on Losartan, Pharmacy is requesting PCP send in alt ARB, please advise

## 2020-05-29 NOTE — Telephone Encounter (Signed)
Sent Mychart message letting pt know

## 2020-05-30 ENCOUNTER — Telehealth: Payer: Self-pay

## 2020-05-30 NOTE — Telephone Encounter (Signed)
LVM that clinic is opening @ 10am on Friday 05/30/2020, told pt to come after 10 or call to r/s appt

## 2020-05-31 ENCOUNTER — Other Ambulatory Visit: Payer: Self-pay

## 2020-05-31 ENCOUNTER — Other Ambulatory Visit (INDEPENDENT_AMBULATORY_CARE_PROVIDER_SITE_OTHER): Payer: 59

## 2020-05-31 DIAGNOSIS — E7800 Pure hypercholesterolemia, unspecified: Secondary | ICD-10-CM | POA: Diagnosis not present

## 2020-05-31 DIAGNOSIS — R7303 Prediabetes: Secondary | ICD-10-CM

## 2020-05-31 LAB — LIPID PANEL
Cholesterol: 199 mg/dL (ref 0–200)
HDL: 47.1 mg/dL (ref >39.00)
LDL Cholesterol: 133 mg/dL — ABNORMAL HIGH (ref 0–99)
NonHDL: 151.66
Total CHOL/HDL Ratio: 4
Triglycerides: 94 mg/dL (ref 0.0–149.0)
VLDL: 18.8 mg/dL (ref 0.0–40.0)

## 2020-05-31 LAB — HEMOGLOBIN A1C: Hgb A1c MFr Bld: 6.4 % (ref 4.6–6.5)

## 2020-06-07 ENCOUNTER — Encounter: Payer: Self-pay | Admitting: Family Medicine

## 2020-06-07 ENCOUNTER — Other Ambulatory Visit: Payer: Self-pay

## 2020-06-07 ENCOUNTER — Ambulatory Visit (INDEPENDENT_AMBULATORY_CARE_PROVIDER_SITE_OTHER): Payer: 59 | Admitting: Family Medicine

## 2020-06-07 VITALS — BP 126/84 | HR 64 | Temp 96.9°F | Ht 64.75 in | Wt 233.2 lb

## 2020-06-07 DIAGNOSIS — E7800 Pure hypercholesterolemia, unspecified: Secondary | ICD-10-CM

## 2020-06-07 DIAGNOSIS — I10 Essential (primary) hypertension: Secondary | ICD-10-CM

## 2020-06-07 DIAGNOSIS — R7303 Prediabetes: Secondary | ICD-10-CM | POA: Diagnosis not present

## 2020-06-07 DIAGNOSIS — Z6839 Body mass index (BMI) 39.0-39.9, adult: Secondary | ICD-10-CM

## 2020-06-07 MED ORDER — METFORMIN HCL ER 500 MG PO TB24: 500.0000 mg | ORAL_TABLET | Freq: Every day | ORAL | 1 refills | Status: DC

## 2020-06-07 NOTE — Progress Notes (Signed)
Subjective:    Patient ID: Patty Williams, female    DOB: 08-14-1960, 60 y.o.   MRN: [id number]  This visit occurred during the SARS-CoV-2 public health emergency.  Safety protocols were in place, including screening questions prior to the visit, additional usage of staff PPE, and extensive cleaning of exam room while observing appropriate contact time as indicated for disinfecting solutions.    HPI Pt presents for f/u of chronic health problems   Wt Readings from Last 3 Encounters:  06/07/20 233 lb 3 oz (105.8 kg)  03/15/20 224 lb 4 oz (101.7 kg)  01/19/20 221 lb 3 oz (100.3 kg)   39.10 kg/m  Not eating well  Not much exercise besides PT  Needs to ride the bike    Doing ok  Hanging in there  [place] /working a lot   Back problems -struggling  Worse in am and pm  Stretches and PT, also massage therapy and has appt with Dr Nelva Bush today  Also new orthotics    Hyperlipidemia Lab Results  Component Value Date   CHOL 199 05/31/2020   CHOL 193 12/22/2019   CHOL 160 12/16/2018   Lab Results  Component Value Date   HDL 47.10 05/31/2020   HDL 44.10 12/22/2019   HDL 37.70 (L) 12/16/2018   Lab Results  Component Value Date   LDLCALC 133 (H) 05/31/2020   LDLCALC 127 (H) 12/22/2019   LDLCALC 100 (H) 12/16/2018   Lab Results  Component Value Date   TRIG 94.0 05/31/2020   TRIG 105.0 12/22/2019   TRIG 115.0 12/16/2018   Lab Results  Component Value Date   CHOLHDL 4 05/31/2020   CHOLHDL 4 12/22/2019   CHOLHDL 4 12/16/2018   Lab Results  Component Value Date   LDLDIRECT 144.2 09/21/2008  diet wise  Has not changed much  She eats too many trans/sat foods Not a lot of fast food     Prediabetes Lab Results  Component Value Date   HGBA1C 6.4 05/31/2020  down from 6.5   Patient Active Problem List   Diagnosis Date Noted  . Intermittent diarrhea 03/15/2020  . Insomnia 08/04/2019  . Hx of adenomatous polyp of colon 03/24/2019  . Family history of pulmonary  fibrosis 12/23/2018  . Family history of kidney disease 12/23/2018  . Colon cancer screening 12/23/2018  . Epigastric pain 12/23/2018  . Vaginal bleeding 09/22/2018  . BMI 39.0-39.9,adult 10/20/2015  . Current use of proton pump inhibitor 10/18/2015  . History of fracture 10/18/2015  . Left knee pain 04/19/2015  . Frequent UTI 10/06/2013  . Seasonal and perennial allergic rhinitis 01/01/2013  . Routine general medical examination at a health care facility 04/30/2011  . Prediabetes   . Plantar fasciitis   . METATARSALGIA 04/11/2010  . 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 Obstructive sleep apnea 06/10/2009  . GERD 11/09/2008  . Hyperlipidemia 07/20/2008  . Essential hypertension, benign 03/02/2008  . Mild reactive airways disease 03/29/2007  . Endometriosis 03/29/2007   Past Medical History:  Diagnosis Date  . ADD (attention deficit disorder) 12/2006   adult, pt denies  . Asthma   . Diabetes mellitus type II    mild  . Diverticulosis 2014   Mild  . Elevated blood pressure reading without diagnosis of hypertension   . Endometriosis    Severe  . Fibroids   . GERD (gastroesophageal reflux disease)   . History of ovarian cyst   . HLD (hyperlipidemia)   . Hx of adenomatous polyp of  colon 03/24/2019  . Hypertension   . Iron deficiency anemia   . Obesity   . Obesity   . OSA on CPAP   . Plantar fasciitis    with orthotics-much difficulty adjusting these  . PMB (postmenopausal bleeding)   . Pneumonia    history of  . PONV (postoperative nausea and vomiting)   . Pre-diabetes   . Rectovaginal fistula   . Sleep apnea   . Stress reaction   . Wears glasses    Past Surgical History:  Procedure Laterality Date  . COLONOSCOPY  01/2009  . DILATATION & CURETTAGE/HYSTEROSCOPY WITH MYOSURE N/A 09/22/2018   Procedure: DILATATION & CURETTAGE/HYSTEROSCOPY;  Surgeon: Aloha Gell, MD;  Location: [facility];  Service: Gynecology;  Laterality: N/A;  .  HEMORRHOID SURGERY    . LAPAROSCOPY     endometriosis  . TONSILLECTOMY AND ADENOIDECTOMY    . UPPER GI ENDOSCOPY  01/2009  . UTERINE SUSPENSION    . WISDOM TOOTH EXTRACTION     Social History   Tobacco Use  . Smoking status: Never Smoker  . Smokeless tobacco: Never Used  Vaping Use  . Vaping Use: Never used  Substance Use Topics  . Alcohol use: Yes    Alcohol/week: 0.0 standard drinks    Comment: rarely  . Drug use: No   Family History  Problem Relation Age of Onset  . Cancer Father        prostate  . Thyroid disease Mother        ?  . Diabetes Mother   . Hypertension Mother   . Heart attack Other        grandfather (in his 37's)  . Parkinsonism Other        grandfater   No Known Allergies Current Outpatient Medications on File Prior to Visit  Medication Sig Dispense Refill  . budesonide-formoterol (SYMBICORT) 80-4.5 MCG/ACT inhaler USE 2 INHALATIONS TWICE A DAY 30.6 g 1  . Cholecalciferol (VITAMIN D) 50 MCG (2000 UT) CAPS Take 2,000 Units by mouth daily.    Marland Kitchen ibuprofen (ADVIL) 200 MG tablet Take 200 mg by mouth every 6 (six) hours as needed.    . Magnesium 400 MG CAPS Take 400 mg by mouth daily.     . Multiple Vitamin (MULTIVITAMIN) tablet Take 1 tablet by mouth daily.    . NONFORMULARY OR COMPOUNDED ITEM Exercise prescription:  Personal training sessions for patient to strengthen quadriceps musculature and core stability for knee pain and knee stability. Dx: M67.52, M23.92 1 each 0  . olmesartan (BENICAR) 40 MG tablet Take 1 tablet (40 mg total) by mouth daily. 90 tablet 3  . Omega-3 Fatty Acids (FISH OIL) 1000 MG CAPS Take 2,000 mg by mouth daily.    . pantoprazole (PROTONIX) 40 MG tablet Take 1 tablet (40 mg total) by mouth 2 (two) times daily before a meal. Breakfast and supper 60 tablet 0  . temazepam (RESTORIL) 15 MG capsule 1 or 2 for sleep as needed 60 capsule 5  . VENTOLIN HFA 108 (90 Base) MCG/ACT inhaler Inhale 2 puffs into the lungs every 4 (four) hours  as needed. 18 g 0   No current facility-administered medications on file prior to visit.    Review of Systems  Constitutional: Positive for fatigue. Negative for activity change, appetite change, fever and unexpected weight change.  HENT: Negative for congestion, ear pain, rhinorrhea, sinus pressure and sore throat.   Eyes: Negative for pain, redness and visual disturbance.  Respiratory: Negative  for cough, shortness of breath and wheezing.   Cardiovascular: Negative for chest pain and palpitations.  Gastrointestinal: Negative for abdominal pain, blood in stool, constipation and diarrhea.  Endocrine: Negative for polydipsia and polyuria.  Genitourinary: Negative for dysuria, frequency and urgency.  Musculoskeletal: Positive for back pain. Negative for arthralgias and myalgias.  Skin: Negative for pallor and rash.  Allergic/Immunologic: Negative for environmental allergies.  Neurological: Negative for dizziness, syncope and headaches.  Hematological: Negative for adenopathy. Does not bruise/bleed easily.  Psychiatric/Behavioral: Negative for decreased concentration and dysphoric mood. The patient is not nervous/anxious.        Objective:   Physical Exam Constitutional:      General: She is not in acute distress.    Appearance: Normal appearance. She is well-developed and well-nourished. She is obese.  HENT:     Head: Normocephalic and atraumatic.     Mouth/Throat:     Mouth: Oropharynx is clear and moist.  Eyes:     Extraocular Movements: EOM normal.     Conjunctiva/sclera: Conjunctivae normal.     Pupils: Pupils are equal, round, and reactive to light.  Neck:     Thyroid: No thyromegaly.     Vascular: No carotid bruit or JVD.  Cardiovascular:     Rate and Rhythm: Normal rate and regular rhythm.     Pulses: Intact distal pulses.     Heart sounds: Normal heart sounds. No gallop.   Pulmonary:     Effort: Pulmonary effort is normal. No respiratory distress.     Breath sounds:  Normal breath sounds. No wheezing or rales.     Comments: No crackles Abdominal:     General: Bowel sounds are normal. There is no distension or abdominal bruit.     Palpations: Abdomen is soft. There is no mass.     Tenderness: There is no abdominal tenderness.  Musculoskeletal:        General: No edema.     Cervical back: Normal range of motion and neck supple.     Right lower leg: No edema.     Left lower leg: No edema.  Lymphadenopathy:     Cervical: No cervical adenopathy.  Skin:    General: Skin is warm and dry.     Findings: No rash.  Neurological:     Mental Status: She is alert.     Coordination: Coordination normal.     Deep Tendon Reflexes: Reflexes are normal and symmetric. Reflexes normal.  Psychiatric:        Mood and Affect: Mood and affect and mood normal.           Assessment & Plan:   Problem List Items Addressed This Visit      Other   Hyperlipidemia    Diet is not optimal  Disc goals for lipids and reasons to control them Rev last labs with pt Rev low sat fat diet in detail LDL in 130s Pt is motivated to work on lifestyle change /would like to avoid statin if possible Plan to start riding exercise bike F/u 4 mo      Prediabetes - Primary    Lab Results  Component Value Date   HGBA1C 6.4 05/31/2020   Down from 6.5  Enc low glycemic diet  Will start metformin xr 500 mg daily to help glucose control and wt loss  Enc to add exercise /bike daily  F/u 4 mo       BMI 39.0-39.9,adult    Discussed how this problem influences  overall health and the risks it imposes  Reviewed plan for weight loss with lower calorie diet (via better food choices and also portion control or program like weight watchers) and exercise building up to or more than 30 minutes 5 days per week including some aerobic activity

## 2020-06-07 NOTE — Patient Instructions (Addendum)
Use recumbent bike regularly   For cholesterol Avoid red meat/ fried foods/ egg yolks/ fatty breakfast meats/ butter, cheese and high fat dairy/ and shellfish   For blood sugar Try to get most of your carbohydrates from produce (with the exception of white potatoes)  Eat less bread/pasta/rice/snack foods/cereals/sweets and other items from the middle of the grocery store (processed carbs)  Fluids- 64 oz daily -mostly water    Follow up in 4 months

## 2020-06-08 ENCOUNTER — Encounter: Payer: Self-pay | Admitting: Family Medicine

## 2020-06-08 NOTE — Assessment & Plan Note (Signed)
Discussed how this problem influences overall health and the risks it imposes  Reviewed plan for weight loss with lower calorie diet (via better food choices and also portion control or program like weight watchers) and exercise building up to or more than 30 minutes 5 days per week including some aerobic activity    

## 2020-06-08 NOTE — Assessment & Plan Note (Signed)
Diet is not optimal  Disc goals for lipids and reasons to control them Rev last labs with pt Rev low sat fat diet in detail LDL in 130s Pt is motivated to work on lifestyle change /would like to avoid statin if possible Plan to start riding exercise bike F/u 4 mo

## 2020-06-08 NOTE — Assessment & Plan Note (Signed)
Lab Results  Component Value Date   HGBA1C 6.4 05/31/2020   Down from 6.5  Enc low glycemic diet  Will start metformin xr 500 mg daily to help glucose control and wt loss  Enc to add exercise /bike daily  F/u 4 mo

## 2020-06-10 ENCOUNTER — Encounter: Payer: Self-pay | Admitting: Family Medicine

## 2020-06-10 DIAGNOSIS — Z6839 Body mass index (BMI) 39.0-39.9, adult: Secondary | ICD-10-CM

## 2020-06-10 DIAGNOSIS — R7303 Prediabetes: Secondary | ICD-10-CM

## 2020-06-14 ENCOUNTER — Ambulatory Visit
Admission: RE | Admit: 2020-06-14 | Discharge: 2020-06-14 | Disposition: A | Discharge status: Home / Self Care | Payer: 59 | Attending: Internal Medicine | Admitting: Internal Medicine

## 2020-06-14 ENCOUNTER — Other Ambulatory Visit: Payer: Self-pay

## 2020-06-14 ENCOUNTER — Ambulatory Visit
Admission: RE | Admit: 2020-06-14 | Discharge: 2020-06-14 | Disposition: A | Discharge status: Home / Self Care | Payer: 59 | Source: Ambulatory Visit | Attending: Internal Medicine | Admitting: Internal Medicine

## 2020-06-14 DIAGNOSIS — J849 Interstitial pulmonary disease, unspecified: Secondary | ICD-10-CM

## 2020-06-18 ENCOUNTER — Other Ambulatory Visit (HOSPITAL_COMMUNITY): Payer: Self-pay | Admitting: Physical Medicine and Rehabilitation

## 2020-06-18 ENCOUNTER — Other Ambulatory Visit: Payer: Self-pay | Admitting: Physical Medicine and Rehabilitation

## 2020-06-18 DIAGNOSIS — M5459 Other low back pain: Secondary | ICD-10-CM

## 2020-06-24 ENCOUNTER — Other Ambulatory Visit: Payer: Self-pay | Admitting: Internal Medicine

## 2020-06-27 ENCOUNTER — Ambulatory Visit (INDEPENDENT_AMBULATORY_CARE_PROVIDER_SITE_OTHER): Payer: 59 | Admitting: Family Medicine

## 2020-06-27 ENCOUNTER — Other Ambulatory Visit: Payer: Self-pay

## 2020-06-27 ENCOUNTER — Encounter (INDEPENDENT_AMBULATORY_CARE_PROVIDER_SITE_OTHER): Payer: Self-pay | Admitting: Family Medicine

## 2020-06-27 VITALS — BP 131/81 | HR 71 | Temp 97.8°F | Ht 65.0 in | Wt 229.0 lb

## 2020-06-27 DIAGNOSIS — E7849 Other hyperlipidemia: Secondary | ICD-10-CM | POA: Diagnosis not present

## 2020-06-27 DIAGNOSIS — I10 Essential (primary) hypertension: Secondary | ICD-10-CM

## 2020-06-27 DIAGNOSIS — E559 Vitamin D deficiency, unspecified: Secondary | ICD-10-CM | POA: Diagnosis not present

## 2020-06-27 DIAGNOSIS — Z0289 Encounter for other administrative examinations: Secondary | ICD-10-CM

## 2020-06-27 DIAGNOSIS — Z9189 Other specified personal risk factors, not elsewhere classified: Secondary | ICD-10-CM | POA: Diagnosis not present

## 2020-06-27 DIAGNOSIS — Z1331 Encounter for screening for depression: Secondary | ICD-10-CM

## 2020-06-27 DIAGNOSIS — E7800 Pure hypercholesterolemia, unspecified: Secondary | ICD-10-CM

## 2020-06-27 DIAGNOSIS — R739 Hyperglycemia, unspecified: Secondary | ICD-10-CM

## 2020-06-27 DIAGNOSIS — E66812 Obesity, class 2: Secondary | ICD-10-CM

## 2020-06-27 DIAGNOSIS — Z6838 Body mass index (BMI) 38.0-38.9, adult: Secondary | ICD-10-CM

## 2020-06-27 DIAGNOSIS — R0602 Shortness of breath: Secondary | ICD-10-CM

## 2020-06-27 DIAGNOSIS — R5383 Other fatigue: Secondary | ICD-10-CM | POA: Diagnosis not present

## 2020-06-28 ENCOUNTER — Encounter: Payer: Self-pay | Admitting: Dietician

## 2020-06-28 ENCOUNTER — Ambulatory Visit
Admission: RE | Admit: 2020-06-28 | Discharge: 2020-06-28 | Disposition: A | Discharge status: Home / Self Care | Payer: 59 | Source: Ambulatory Visit | Attending: Physical Medicine and Rehabilitation | Admitting: Physical Medicine and Rehabilitation

## 2020-06-28 ENCOUNTER — Encounter: Payer: 59 | Attending: Family Medicine | Admitting: Dietician

## 2020-06-28 VITALS — Ht 65.0 in | Wt 230.5 lb

## 2020-06-28 DIAGNOSIS — M5459 Other low back pain: Secondary | ICD-10-CM | POA: Insufficient documentation

## 2020-06-28 DIAGNOSIS — R7303 Prediabetes: Secondary | ICD-10-CM | POA: Diagnosis not present

## 2020-06-28 DIAGNOSIS — Z6838 Body mass index (BMI) 38.0-38.9, adult: Secondary | ICD-10-CM

## 2020-06-28 LAB — COMPREHENSIVE METABOLIC PANEL
ALT: 38 IU/L — ABNORMAL HIGH (ref 0–32)
AST: 28 IU/L (ref 0–40)
Albumin/Globulin Ratio: 1.8 (ref 1.2–2.2)
Albumin: 4.5 g/dL (ref 3.8–4.9)
Alkaline Phosphatase: 106 IU/L (ref 44–121)
BUN/Creatinine Ratio: 26 (ref 12–28)
BUN: 16 mg/dL (ref 8–27)
Bilirubin Total: 0.3 mg/dL (ref 0.0–1.2)
CO2: 21 mmol/L (ref 20–29)
Calcium: 9.6 mg/dL (ref 8.7–10.3)
Chloride: 103 mmol/L (ref 96–106)
Creatinine, Ser: 0.62 mg/dL (ref 0.57–1.00)
GFR calc Af Amer: 113 mL/min/{1.73_m2} (ref >59)
GFR calc non Af Amer: 98 mL/min/{1.73_m2} (ref >59)
Globulin, Total: 2.5 g/dL (ref 1.5–4.5)
Glucose: 113 mg/dL — ABNORMAL HIGH (ref 65–99)
Potassium: 4.4 mmol/L (ref 3.5–5.2)
Sodium: 142 mmol/L (ref 134–144)
Total Protein: 7 g/dL (ref 6.0–8.5)

## 2020-06-28 LAB — CBC WITH DIFFERENTIAL/PLATELET
Basophils Absolute: 0 10*3/uL (ref 0.0–0.2)
Basos: 1 %
EOS (ABSOLUTE): 0.1 10*3/uL (ref 0.0–0.4)
Eos: 2 %
Hematocrit: 39 % (ref 34.0–46.6)
Hemoglobin: 13 g/dL (ref 11.1–15.9)
Immature Grans (Abs): 0 10*3/uL (ref 0.0–0.1)
Immature Granulocytes: 0 %
Lymphocytes Absolute: 1.8 10*3/uL (ref 0.7–3.1)
Lymphs: 31 %
MCH: 28.9 pg (ref 26.6–33.0)
MCHC: 33.3 g/dL (ref 31.5–35.7)
MCV: 87 fL (ref 79–97)
Monocytes Absolute: 0.5 10*3/uL (ref 0.1–0.9)
Monocytes: 8 %
Neutrophils Absolute: 3.3 10*3/uL (ref 1.4–7.0)
Neutrophils: 58 %
Platelets: 268 10*3/uL (ref 150–450)
RBC: 4.5 x10E6/uL (ref 3.77–5.28)
RDW: 14.3 % (ref 11.7–15.4)
WBC: 5.7 10*3/uL (ref 3.4–10.8)

## 2020-06-28 LAB — INSULIN, RANDOM: INSULIN: 19.9 u[IU]/mL (ref 2.6–24.9)

## 2020-06-28 LAB — VITAMIN D 25 HYDROXY (VIT D DEFICIENCY, FRACTURES): Vit D, 25-Hydroxy: 81.6 ng/mL (ref 30.0–100.0)

## 2020-06-28 LAB — T4: T4, Total: 6.6 ug/dL (ref 4.5–12.0)

## 2020-06-28 LAB — TSH: TSH: 1.53 u[IU]/mL (ref 0.450–4.500)

## 2020-06-28 LAB — T3: T3, Total: 111 ng/dL (ref 71–180)

## 2020-06-28 LAB — FOLATE: Folate: 18.8 ng/mL (ref >3.0)

## 2020-06-28 LAB — VITAMIN B12: Vitamin B-12: 945 pg/mL (ref 232–1245)

## 2020-06-28 NOTE — Progress Notes (Signed)
Medical Nutrition Therapy: Visit start time: 1330  end time: 1430  Assessment:  Diagnosis: pre-diabetes, obesity Past medical history: HTN, HLD, diverticulosis, GERD, sleep apnea, asthma, spndylolysthesis lumbar 4,5  Psychosocial issues/ stress concerns: High stress level; does report eating/ grazing to manage stress  Preferred learning method:  . Auditory . Visual . Note taking during discussion, questions after reading  Current weight: 230.5lbs with tennis shoes  Height: 5'5" Medications, supplements: reconciled list in medical record  Progress and evaluation:   Recent (05/2020) HbA1C was  6.4%; started taking Metformin.   [facility] years [facility], lost 75lbs in 3 years but the program did not have a long-term maintenance plan. Has more recently tried some intermittent fasting which helped until developing hip pain and more eating during holidays  Work involves long hours and erratic breaks, sometimes able to eat a few bites of food only, and finishes meal later.   Partner is also working on weight loss; partner is considering weight loss surgery. Patient does not want to have weight loss surgery and also wants to avoid weight loss medication.  Physical activity: PT, some stationary bike 10-30 minutes up to 6 times a week depending on pain.   Dietary Intake:  Usual eating pattern includes 2 meals and several snacks per day. Dining out frequency: 2-3 meals per week. Beginning pattern with [facility]: Breakfast: 10am 2 eggs or [place] yogurt, 1-2 slices bread or 1 slice low-cal bread + 1 slice lowfat cheese Snack: 100kcal snack ie fruit, lowfat cheese or yogurt Lunch: sandwich with low-cal bread, 4oz meat, 1oz lowfat cheese no mayo/fat added, + low carb veg and 1 fruit; or 200-300kcal frozen meal with 20g protein + 1 fruit Snack: same as am Supper: 6-8oz lean meat + 2c low carb veg or 1 cup  higher carb veg/ beans/ corn, no added fats or sugar Snack: same as am; total 300kcal for snacks daily Beverages: sugar free only  Nutrition Care Education: Topics covered:  Basic nutrition: appropriate meal and snack schedule, general nutrition guidelines    Weight control: importance of low sugar and low fat choices, health benefits of Mediterranean eating pattern; strategies for controlling hunger; appropriate food portions; managing grazing pattern by having structured meals and snacks and pre-portioning snacks. Pre-Diabetes: appropriate meal and snack schedule, appropriate carb intake and balance, healthy carb choices   Nutritional Diagnosis:  [place]-2.2 Altered nutrition-related laboratory As related to pre-diabetes.  As evidenced by recent HbA1C of 6.4%. [employer]-3.3 Overweight/obesity As related to history of excess calories and inadequate physical activity.  As evidenced by patient with current BMI of 38, working on diet and lifestyle changes to promote weight loss and reduce risk for diabetes.  Intervention:  . Instruction and discussion as noted above. . Patient has concrete plans for making dietary changes; voices readiness for lifestyle change to improve health and back pain. . Established goals to aid in following diet plan, with input from patient. . Patient will be participating in medically supervised weight loss program and will schedule RD follow up later as needed.  Education Materials given:  . Plate Planner with food lists . Smart Snacking; 100-Calorie Snacks handouts . Visit summary with goals/ instructions   Learner/ who was taught:  . Patient   Level of understanding: Marland Kitchen Verbalizes/ demonstrates competency   Demonstrated degree of understanding via:   Teach back Learning barriers: . None  Willingness to learn/ readiness for change: . Eager,  change in progress  Monitoring and Evaluation:  Dietary intake, exercise, BG control, and body weight      follow up: prn

## 2020-06-28 NOTE — Patient Instructions (Signed)
   Follow weight clinic diet. If there is a food listed that you can't or don't eat, make sure to sub with the same food group.   Try allowing at least 2 hours between any meals and snacks to avoid grazing.   Portion snacks and aim for about 15g carb to keep to about 100 calories.

## 2020-07-03 NOTE — Progress Notes (Signed)
Chief Complaint:   Patty Williams (MR# [id number]) is a 60 y.o. female who presents for evaluation and treatment of obesity and related comorbidities. Current BMI is Body mass index is 38.11 kg/m. Patty Williams has been struggling with her weight for many years and has been unsuccessful in either losing weight, maintaining weight loss, or reaching her healthy weight goal.  Patty Williams is currently in the action stage of change and ready to dedicate time achieving and maintaining a healthier weight. Patty Williams is interested in becoming our patient and working on intensive lifestyle modifications including (but not limited to) diet and exercise for weight loss.  Patty Williams's habits were reviewed today and are as follows: Her family eats meals together, she struggles with family and or coworkers weight loss sabotage, her desired weight loss is 54-64 lbs, she has been heavy most of her life, she started gaining weight in high school, her heaviest weight ever was 232 pounds, she has significant food cravings issues, she snacks frequently in the evenings, she skips meals frequently, she is frequently drinking liquids with calories, she frequently makes poor food choices, she frequently eats larger portions than normal and she struggles with emotional eating.  Depression Screen Patty Williams's Food and Mood (modified PHQ-9) score was 15.  Depression screen Patty Williams 2/9 06/28/2020  Decreased Interest 0  Down, Depressed, Hopeless 0  PHQ - 2 Score 0  Altered sleeping -  Tired, decreased energy -  Change in appetite -  Feeling bad or failure about yourself  -  Trouble concentrating -  Moving slowly or fidgety/restless -  Suicidal thoughts -  PHQ-9 Score -  Difficult doing work/chores -   Subjective:   1. Other fatigue [name] admits to daytime somnolence and admits to waking up still tired. Patent has a history of symptoms of daytime fatigue and morning headache. Patty Williams generally gets 7 or 8 hours of sleep per night, and  states that she has nightime awakenings. Snoring is present. Apneic episodes are present. [name] Score is 8.  2. Shortness of breath on exertion [facility] notes increasing shortness of breath with exercising and seems to be worsening over time with weight gain. She notes getting out of breath sooner with activity than she used to. This has not gotten worse recently. [name] denies shortness of breath at rest or orthopnea.  3. Hyperglycemia Patty Williams has some elevated glucose and A1c readings in Epic, including A1c of 6.5. She is working on diet and exercise to help control. She notes polyphagia.  4. Other hyperlipidemia Patty Williams is working on diet and weight loss. Her recent LDL was elevated at 133.  5. Essential hypertension Patty Williams's blood pressure is well controlled today on her medications. Her goal is to improve her blood pressure with weight loss and decrease her medications.  6. Vitamin D deficiency Patty Williams is on Vit D OTC, and she has no recent labs results. She notes fatigue.  7. At risk for heart disease Patty Williams is at a higher than average risk for cardiovascular disease due to obesity.   Assessment/Plan:   1. Other fatigue Patty Williams does feel that her weight is causing her energy to be lower than it should be. Fatigue may be related to obesity, depression or many other causes. Labs will be ordered, and in the meanwhile, Patty Williams will focus on self care including making healthy food choices, increasing physical activity and focusing on stress reduction.  - Vitamin B12 - CBC with Differential/Platelet - EKG 12-Lead - Folate - T3 -  T4 - TSH  2. Shortness of breath on exertion Patty Williams does feel that she gets out of breath more easily that she used to when she exercises. Patty Williams's shortness of breath appears to be obesity related and exercise induced. She has agreed to work on weight loss and gradually increase exercise to treat her exercise induced shortness of breath. Will continue to monitor  closely.  3. Hyperglycemia Fasting labs will be obtained today, and results with be discussed with Stanton Kidney in 2 weeks at her follow up visit. In the meanwhile Patty Williams was started on a lower simple carbohydrate diet and will work on weight loss efforts.  - Insulin, random  4. Other hyperlipidemia Cardiovascular risk and specific lipid/LDL goals reviewed. We discussed several lifestyle modifications today. Patty Williams will start her Category 2 plan, and she will continue to work on exercise and weight loss efforts. Orders and follow up as documented in patient record.   5. Essential hypertension Patty Williams will start her Category 2 plan, and will continue working on healthy weight loss and exercise to improve blood pressure control. We will watch for signs of hypotension as she continues her lifestyle modifications. We will check labs today.  - Comprehensive metabolic panel  6. Vitamin D deficiency Low Vitamin D level contributes to fatigue and are associated with obesity, breast, and colon cancer. We will check labs today. Patty Williams will follow-up for routine testing of Vitamin D, at least 2-3 times per year to avoid over-replacement.  - VITAMIN D 25 Hydroxy (Vit-D Deficiency, Fractures)  7. Screening for depression Patty Williams had a positive depression screening. Depression is commonly associated with obesity and often results in emotional eating behaviors. We will monitor this closely and work on CBT to help improve the non-hunger eating patterns. Referral to Psychology may be required if no improvement is seen as she continues in our clinic.  8. At risk for heart disease Patty Williams was given approximately 30 minutes of coronary artery disease prevention counseling today. She is 60 y.o. female and has risk factors for heart disease including obesity. We discussed intensive lifestyle modifications today with an emphasis on specific weight loss instructions and strategies.   Repetitive spaced learning was employed today to  elicit superior memory formation and behavioral change.  9. Class 2 severe obesity with serious comorbidity and body mass index (BMI) of 38.0 to 38.9 in adult, unspecified obesity type [facility]) Karmen is currently in the action stage of change and her goal is to continue with weight loss efforts. I recommend Arrabella begin the structured treatment plan as follows:  She has agreed to the Category 2 Plan + 100 calories.  Exercise goals: No exercise has been prescribed for now, while we concentrate on nutritional changes.  Behavioral modification strategies: increasing lean protein intake and meal planning and cooking strategies.  She was informed of the importance of frequent follow-up visits to maximize her success with intensive lifestyle modifications for her multiple health conditions. She was informed we would discuss her lab results at her next visit unless there is a critical issue that needs to be addressed sooner. Janea agreed to keep her next visit at the agreed upon time to discuss these results.  Objective:   Blood pressure 131/81, pulse 71, temperature 97.8 F (36.6 C), height 5\' 5"  (1.651 m), weight 229 lb (103.9 kg), SpO2 98 %. Body mass index is 38.11 kg/m.  EKG: Normal sinus rhythm, rate 72 BPM.  Indirect Calorimeter completed today shows a VO2 of 189 and a REE of  1316.  Her calculated basal metabolic rate is 6761 thus her basal metabolic rate is worse than expected.  General: Cooperative, alert, well developed, in no acute distress. HEENT: Conjunctivae and lids unremarkable. Cardiovascular: Regular rhythm.  Lungs: Normal work of breathing. Neurologic: No focal deficits.   Lab Results  Component Value Date   CREATININE 0.62 06/27/2020   BUN 16 06/27/2020   NA 142 06/27/2020   K 4.4 06/27/2020   CL 103 06/27/2020   CO2 21 06/27/2020   Lab Results  Component Value Date   ALT 38 (H) 06/27/2020   AST 28 06/27/2020   ALKPHOS 106 06/27/2020   BILITOT 0.3 06/27/2020   Lab  Results  Component Value Date   HGBA1C 6.4 05/31/2020   HGBA1C 6.5 12/22/2019   HGBA1C 6.3 12/16/2018   HGBA1C 6.1 12/10/2017   HGBA1C 6.2 12/10/2016   Lab Results  Component Value Date   INSULIN 19.9 06/27/2020   Lab Results  Component Value Date   TSH 1.530 06/27/2020   Lab Results  Component Value Date   CHOL 199 05/31/2020   HDL 47.10 05/31/2020   LDLCALC 133 (H) 05/31/2020   LDLDIRECT 144.2 09/21/2008   TRIG 94.0 05/31/2020   CHOLHDL 4 05/31/2020   Lab Results  Component Value Date   WBC 5.7 06/27/2020   HGB 13.0 06/27/2020   HCT 39.0 06/27/2020   MCV 87 06/27/2020   PLT 268 06/27/2020   Lab Results  Component Value Date   IRON 62 11/09/2008   FERRITIN 3.9 (L) 11/09/2008   Attestation Statements:   Reviewed by clinician on day of visit: allergies, medications, problem list, medical history, surgical history, family history, social history, and previous encounter notes.   I, Trixie Dredge, am acting as transcriptionist for Dennard Nip, MD.  I have reviewed the above documentation for accuracy and completeness, and I agree with the above. - Dennard Nip, MD

## 2020-07-11 ENCOUNTER — Ambulatory Visit (INDEPENDENT_AMBULATORY_CARE_PROVIDER_SITE_OTHER): Payer: 59 | Admitting: Family Medicine

## 2020-07-11 ENCOUNTER — Other Ambulatory Visit: Payer: Self-pay

## 2020-07-11 ENCOUNTER — Encounter (INDEPENDENT_AMBULATORY_CARE_PROVIDER_SITE_OTHER): Payer: Self-pay | Admitting: Family Medicine

## 2020-07-11 VITALS — BP 134/74 | HR 73 | Temp 97.9°F | Ht 65.0 in | Wt 221.0 lb

## 2020-07-11 DIAGNOSIS — Z6836 Body mass index (BMI) 36.0-36.9, adult: Secondary | ICD-10-CM

## 2020-07-11 DIAGNOSIS — R7303 Prediabetes: Secondary | ICD-10-CM | POA: Diagnosis not present

## 2020-07-11 DIAGNOSIS — R7401 Elevation of levels of liver transaminase levels: Secondary | ICD-10-CM | POA: Diagnosis not present

## 2020-07-12 ENCOUNTER — Telehealth: Payer: Self-pay | Admitting: Family Medicine

## 2020-07-12 NOTE — Telephone Encounter (Signed)
Patient is seeing Dr Leafy Ro and she felt like there was no need in keeping the appts for this month so that's why she cancelled her appt. Dr Leafy Ro will be managing her diabetes EM

## 2020-07-12 NOTE — Telephone Encounter (Signed)
No problem. Thanks for the heads up

## 2020-07-19 ENCOUNTER — Ambulatory Visit: Payer: 59 | Admitting: Dietician

## 2020-07-22 ENCOUNTER — Other Ambulatory Visit: Payer: Self-pay | Admitting: Internal Medicine

## 2020-07-25 ENCOUNTER — Other Ambulatory Visit: Payer: Self-pay | Admitting: Family Medicine

## 2020-07-25 NOTE — Progress Notes (Signed)
Chief Complaint:   OBESITY Patty Williams is here to discuss her progress with her obesity treatment plan along with follow-up of her obesity related diagnoses. Patty Williams is on the Category 2 Plan + 100 calories and states she is following her eating plan approximately 90% of the time. Patty Williams states she is doing physical therapy for 20 minutes 7 times per week.  Today's visit was #: 2 Starting weight: 229 lbs Starting date: 06/27/2020 Today's weight: 221 lbs Today's date: 07/11/2020 Total lbs lost to date: 8 Total lbs lost since last in-office visit: 8  Interim History: Patty Williams has done very well with her weight loss. She tolerated her Category 2 plan reasonably well, but she would like to discuss more options for dinner. Her hunger was mostly controlled.  Subjective:   1. Elevated ALT measurement Patty Williams's ALT is mildly elevated. This is a new diagnosis. She denies abdominal pain or jaundice. I discussed labs with the patient today.  2. Pre-diabetes Patty Williams has a new diagnosis of pre-diabetes. She is working on diet and exercise. Her polyphagia has already improved with increased protein and decreased simple carbohydrates. She denies signs of hypoglycemia. I discussed labs with the patient today.  Assessment/Plan:   1. Elevated ALT measurement We discussed the likely diagnosis of non-alcoholic fatty liver disease today and how this condition is obesity related. Patty Williams was educated the importance of weight loss. Patty Williams agreed to continue with her weight loss efforts with healthier diet and exercise as an essential part of her treatment plan. We will recheck labs in 3 months.  2. Pre-diabetes Patty Williams will continue to work on weight loss, diet, exercise, and decreasing simple carbohydrates to help decrease the risk of diabetes. We will recheck labs in 3 months.  3. Class 2 severe obesity with serious comorbidity and body mass index (BMI) of 36.0 to 36.9 in adult, unspecified obesity type [facility]) Patty Williams is currently in  the action stage of change. As such, her goal is to continue with weight loss efforts. She has agreed to the Category 2 Plan and keeping a food journal and adhering to recommended goals of 400-550 calories and 40+ grams of protein at supper daily.   Exercise goals: As is.  Behavioral modification strategies: increasing water intake.  Patty Williams has agreed to follow-up with our clinic in 2 weeks. She was informed of the importance of frequent follow-up visits to maximize her success with intensive lifestyle modifications for her multiple health conditions.   Objective:   Blood pressure 134/74, pulse 73, temperature 97.9 F (36.6 C), height 5\' 5"  (1.651 m), weight 221 lb (100.2 kg), SpO2 96 %. Body mass index is 36.78 kg/m.  General: Cooperative, alert, well developed, in no acute distress. HEENT: Conjunctivae and lids unremarkable. Cardiovascular: Regular rhythm.  Lungs: Normal work of breathing. Neurologic: No focal deficits.   Lab Results  Component Value Date   CREATININE 0.62 06/27/2020   BUN 16 06/27/2020   NA 142 06/27/2020   K 4.4 06/27/2020   CL 103 06/27/2020   CO2 21 06/27/2020   Lab Results  Component Value Date   ALT 38 (H) 06/27/2020   AST 28 06/27/2020   ALKPHOS 106 06/27/2020   BILITOT 0.3 06/27/2020   Lab Results  Component Value Date   HGBA1C 6.4 05/31/2020   HGBA1C 6.5 12/22/2019   HGBA1C 6.3 12/16/2018   HGBA1C 6.1 12/10/2017   HGBA1C 6.2 12/10/2016   Lab Results  Component Value Date   INSULIN 19.9 06/27/2020   Lab  Results  Component Value Date   TSH 1.530 06/27/2020   Lab Results  Component Value Date   CHOL 199 05/31/2020   HDL 47.10 05/31/2020   LDLCALC 133 (H) 05/31/2020   LDLDIRECT 144.2 09/21/2008   TRIG 94.0 05/31/2020   CHOLHDL 4 05/31/2020   Lab Results  Component Value Date   WBC 5.7 06/27/2020   HGB 13.0 06/27/2020   HCT 39.0 06/27/2020   MCV 87 06/27/2020   PLT 268 06/27/2020   Lab Results  Component Value Date   IRON 62  11/09/2008   FERRITIN 3.9 (L) 11/09/2008   Attestation Statements:   Reviewed by clinician on day of visit: allergies, medications, problem list, medical history, surgical history, family history, social history, and previous encounter notes.  Time spent on visit including pre-visit chart review and post-visit care and charting was 45 minutes.    I, Trixie Dredge, am acting as transcriptionist for Dennard Nip, MD.  I have reviewed the above documentation for accuracy and completeness, and I agree with the above. -  Dennard Nip, MD

## 2020-07-25 NOTE — Telephone Encounter (Signed)
Pharmacy requests refill on: Symbicort 80-4.5 mcg Inhaler   LAST REFILL: 06/12/2019 (Q-30.6 g, R-1) LAST OV: 06/07/2020 NEXT OV: 01/31/2021 PHARMACY: [place], [place]

## 2020-07-28 ENCOUNTER — Encounter: Payer: Self-pay | Admitting: Family Medicine

## 2020-07-29 ENCOUNTER — Telehealth: Payer: Self-pay | Admitting: *Deleted

## 2020-07-29 ENCOUNTER — Ambulatory Visit (INDEPENDENT_AMBULATORY_CARE_PROVIDER_SITE_OTHER): Payer: 59 | Admitting: Family Medicine

## 2020-07-29 NOTE — Telephone Encounter (Signed)
That sounds fine, thanks  

## 2020-07-29 NOTE — Telephone Encounter (Signed)
Sent mychart message letting pt know 

## 2020-07-29 NOTE — Telephone Encounter (Signed)
Pt sent a message on mychart saying:  Since I was able to get in with Dr Leafy Ro I canceled our May follow up but made sure to schedule our H&P together later on. Hopefully that is ok with you? I figured I would have delivered on my promise of weight loss by then! Would that work?? Thank you for setting me on the right track.

## 2020-08-01 NOTE — Progress Notes (Signed)
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 Occasional mask leak. Likes to sleep with pillow over head.  Recognizes work-related stress impacting sleep with frequent WASO in last 2 weeks. No problem falling asleep. At least once woke coughing with panic attack and recognizes role of GERD. Tried HOB up on brick- slid down. Eats earlier now.  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 No hx connective tissue disease. Not much asthma- has rescue hfa if needed. CXR 01/13/2019- IMPRESSION: No active cardiopulmonary disease.  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 No room to reduce pressure range without some loss of control. Uses Symbicort once daily. Hasn't used her rescue inhaler in years but we will leave it on her list. No exacerbation. Continuing to work as Pharmacist, community. She is concerned that this and Mother's death from ILD put her in high risk for for ILD herself.  CXR 06/14/20-  FINDINGS: The heart size and mediastinal contours are within normal  limits. Both lungs are clear. The visualized skeletal structures are unremarkable. No evidence of interstitial lung disease. IMPRESSION: No active cardiopulmonary disease.  ROS-see HPI   + = positive Constitutional:    weight loss, night sweats, fevers, chills, fatigue, lassitude. HEENT:    headaches, difficulty swallowing, tooth/dental problems, sore throat,       sneezing, itching, ear ache, nasal congestion, post nasal drip, snoring CV:    chest pain, orthopnea, PND, swelling in lower extremities, anasarca,                                   dizziness, palpitations Resp:   +shortness of breath with exertion or at rest.                productive cough,   non-productive cough, coughing up of blood.              change in color of mucus.  wheezing.   Skin:    rash or lesions. GI:    +heartburn, indigestion, abdominal pain, nausea, vomiting, diarrhea,                 change in bowel habits, loss of appetite GU: dysuria, change in color of urine, no urgency or frequency,  flank pain. MS:   joint pain, stiffness, decreased range of motion, back pain. Neuro-     nothing unusual Psych:  change in mood or affect.  depression or anxiety.   memory loss.  OBJ- Physical Exam General- Alert, Oriented, Affect-appropriate, Distress- none  acute, + overweight Skin- rash-none, lesions- none, excoriation- none Lymphadenopathy- none Head- atraumatic            Eyes- Gross vision intact, PERRLA, conjunctivae and secretions clear            Ears- Hearing, canals-normal            Nose- Clear, no-Septal dev, mucus, polyps, erosion, perforation             Throat- Mallampati II-III , mucosa clear , drainage- none, tonsils- atrophic Neck- flexible , trachea midline, no stridor , thyroid nl, carotid no bruit Chest - symmetrical excursion , unlabored           Heart/CV- RRR , no murmur , no gallop  , no rub, nl s1 s2                           - JVD- none , edema- none, stasis changes- none, varices-  none           Lung- clear to P&A- no crackles, wheeze- none, cough- none , dullness-none, rub- none           Chest wall-  Abd-  Br/ Gen/ Rectal- Not done, not indicated Extrem- cyanosis- none, clubbing, none, atrophy- none, strength- nl Neuro- grossly intact to observation

## 2020-08-02 ENCOUNTER — Ambulatory Visit: Payer: 59

## 2020-08-02 ENCOUNTER — Other Ambulatory Visit: Payer: Self-pay

## 2020-08-02 ENCOUNTER — Ambulatory Visit (INDEPENDENT_AMBULATORY_CARE_PROVIDER_SITE_OTHER): Payer: 59 | Admitting: Internal Medicine

## 2020-08-02 ENCOUNTER — Encounter: Payer: Self-pay | Admitting: Internal Medicine

## 2020-08-02 VITALS — BP 122/82 | HR 59 | Temp 97.1°F | Ht 65.0 in | Wt 225.2 lb

## 2020-08-02 DIAGNOSIS — G4733 Obstructive sleep apnea (adult) (pediatric): Secondary | ICD-10-CM

## 2020-08-02 DIAGNOSIS — J4520 Mild intermittent asthma, uncomplicated: Secondary | ICD-10-CM

## 2020-08-02 MED ORDER — ALBUTEROL SULFATE HFA 108 (90 BASE) MCG/ACT IN AERS: 2.0000 | INHALATION_SPRAY | Freq: Four times a day (QID) | RESPIRATORY_TRACT | 6 refills | Status: DC | PRN

## 2020-08-02 NOTE — Patient Instructions (Signed)
We can continue CPAP auto 10-20   Order- future CXR 1 year    Dx Asthma, at risk for ILD  Written script- Travel CPAP machine, auto 8-20, mask of choice, hoses, filters, supplies  Please call if we can help

## 2020-08-02 NOTE — Assessment & Plan Note (Signed)
Benefits from CPAP with good compliance and control. She would like a travel machine- discussed and will print script for parameters. No obvious basis for pressure surge- machine is 61 yrs old. Watching this issue.  Plan- continue auto 8-20

## 2020-08-02 NOTE — Assessment & Plan Note (Addendum)
Easily managed with Symbicort. We will leave rescue inhaler on med list. She remains concerned about potential to develop ILD. Plan- CXR

## 2020-08-19 ENCOUNTER — Other Ambulatory Visit: Payer: Self-pay | Admitting: Internal Medicine

## 2020-08-22 ENCOUNTER — Encounter (INDEPENDENT_AMBULATORY_CARE_PROVIDER_SITE_OTHER): Payer: Self-pay | Admitting: Physician Assistant

## 2020-08-22 ENCOUNTER — Other Ambulatory Visit: Payer: Self-pay

## 2020-08-22 ENCOUNTER — Ambulatory Visit (INDEPENDENT_AMBULATORY_CARE_PROVIDER_SITE_OTHER): Payer: 59 | Admitting: Physician Assistant

## 2020-08-22 VITALS — BP 114/82 | HR 65 | Temp 98.1°F | Ht 65.0 in | Wt 222.0 lb

## 2020-08-22 DIAGNOSIS — Z6836 Body mass index (BMI) 36.0-36.9, adult: Secondary | ICD-10-CM

## 2020-08-22 DIAGNOSIS — E7849 Other hyperlipidemia: Secondary | ICD-10-CM

## 2020-08-26 NOTE — Progress Notes (Signed)
Chief Complaint:   OBESITY Patty Williams is here to discuss her progress with her obesity treatment plan along with follow-up of her obesity related diagnoses. Patty Williams is on the Category 2 Plan and states she is following her eating plan approximately 50% of the time. Patty Williams states she is walking 15-20 minutes 7 times per week.  Today's visit was #: 3 Starting weight: 229 lbs Starting date: 06/27/2020 Today's weight: 222 lbs Today's date: 08/22/2020 Total lbs lost to date: 7 Total lbs lost since last in-office visit: 0  Interim History: Patty Williams is hungry after lunch and states that her lunch option is not enough food. She could not figure out how to journal dinner and would like to journal the entire day.  Subjective:   1. Other hyperlipidemia Patty Williams's last lipid panel was not at goal. She is on omega 3 fish oil 2,000 mg daily.  Lab Results  Component Value Date   ALT 38 (H) 06/27/2020   AST 28 06/27/2020   ALKPHOS 106 06/27/2020   BILITOT 0.3 06/27/2020   Lab Results  Component Value Date   CHOL 199 05/31/2020   HDL 47.10 05/31/2020   LDLCALC 133 (H) 05/31/2020   LDLDIRECT 144.2 09/21/2008   TRIG 94.0 05/31/2020   CHOLHDL 4 05/31/2020    Assessment/Plan:   1. Other hyperlipidemia Cardiovascular risk and specific lipid/LDL goals reviewed.  We discussed several lifestyle modifications today and Patty Williams will continue to work on diet, exercise and weight loss efforts. Orders and follow up as documented in patient record. Recheck labs in 3 months.  Counseling Intensive lifestyle modifications are the first line treatment for this issue. . Dietary changes: Increase soluble fiber. Decrease simple carbohydrates. . Exercise changes: Moderate to vigorous-intensity aerobic activity 150 minutes per week if tolerated. . Lipid-lowering medications: see documented in medical record.  2. Class 2 severe obesity with serious comorbidity and body mass index (BMI) of 36.0 to 36.9 in adult, unspecified  obesity type Patty Williams) Patty Williams is currently in the action stage of change. As such, her goal is to continue with weight loss efforts. She has agreed to keeping a food journal and adhering to recommended goals of 1200 calories and 85 g protein.   Exercise goals: As is  Behavioral modification strategies: meal planning and cooking strategies and keeping a strict food journal.  Patty Williams has agreed to follow-up with our clinic in 2 weeks. She was informed of the importance of frequent follow-up visits to maximize her success with intensive lifestyle modifications for her multiple health conditions.   Objective:   Blood pressure 114/82, pulse 65, temperature 98.1 F (36.7 C), height 5\' 5"  (1.651 m), weight 222 lb (100.7 kg), SpO2 97 %. Body mass index is 36.94 kg/m.  General: Cooperative, alert, well developed, in no acute distress. HEENT: Conjunctivae and lids unremarkable. Cardiovascular: Regular rhythm.  Lungs: Normal work of breathing. Neurologic: No focal deficits.   Lab Results  Component Value Date   CREATININE 0.62 06/27/2020   BUN 16 06/27/2020   NA 142 06/27/2020   K 4.4 06/27/2020   CL 103 06/27/2020   CO2 21 06/27/2020   Lab Results  Component Value Date   ALT 38 (H) 06/27/2020   AST 28 06/27/2020   ALKPHOS 106 06/27/2020   BILITOT 0.3 06/27/2020   Lab Results  Component Value Date   HGBA1C 6.4 05/31/2020   HGBA1C 6.5 12/22/2019   HGBA1C 6.3 12/16/2018   HGBA1C 6.1 12/10/2017   HGBA1C 6.2 12/10/2016   Lab  Results  Component Value Date   INSULIN 19.9 06/27/2020   Lab Results  Component Value Date   TSH 1.530 06/27/2020   Lab Results  Component Value Date   CHOL 199 05/31/2020   HDL 47.10 05/31/2020   LDLCALC 133 (H) 05/31/2020   LDLDIRECT 144.2 09/21/2008   TRIG 94.0 05/31/2020   CHOLHDL 4 05/31/2020   Lab Results  Component Value Date   WBC 5.7 06/27/2020   HGB 13.0 06/27/2020   HCT 39.0 06/27/2020   MCV 87 06/27/2020   PLT 268 06/27/2020   Lab  Results  Component Value Date   IRON 62 11/09/2008   FERRITIN 3.9 (L) 11/09/2008     Attestation Statements:   Reviewed by clinician on day of visit: allergies, medications, problem list, medical history, surgical history, family history, social history, and previous encounter notes.  Time spent on visit including pre-visit chart review and post-visit care and charting was 30 minutes.   Coral Ceo, am acting as Location manager for [employer], PA-C.  I have reviewed the above documentation for accuracy and completeness, and I agree with the above. Abby Potash, PA-C

## 2020-09-12 ENCOUNTER — Other Ambulatory Visit: Payer: Self-pay

## 2020-09-12 ENCOUNTER — Ambulatory Visit (INDEPENDENT_AMBULATORY_CARE_PROVIDER_SITE_OTHER): Payer: 59 | Admitting: Family Medicine

## 2020-09-12 ENCOUNTER — Encounter (INDEPENDENT_AMBULATORY_CARE_PROVIDER_SITE_OTHER): Payer: Self-pay | Admitting: Family Medicine

## 2020-09-12 VITALS — BP 126/80 | HR 70 | Temp 98.1°F | Ht 65.0 in | Wt 221.0 lb

## 2020-09-12 DIAGNOSIS — R7303 Prediabetes: Secondary | ICD-10-CM | POA: Diagnosis not present

## 2020-09-12 DIAGNOSIS — E559 Vitamin D deficiency, unspecified: Secondary | ICD-10-CM

## 2020-09-12 DIAGNOSIS — Z6838 Body mass index (BMI) 38.0-38.9, adult: Secondary | ICD-10-CM | POA: Diagnosis not present

## 2020-09-12 DIAGNOSIS — Z9189 Other specified personal risk factors, not elsewhere classified: Secondary | ICD-10-CM | POA: Diagnosis not present

## 2020-09-12 MED ORDER — METFORMIN HCL ER 500 MG PO TB24: ORAL_TABLET | ORAL | 0 refills | Status: DC

## 2020-09-16 NOTE — Progress Notes (Signed)
Chief Complaint:   OBESITY Patty Williams is here to discuss her progress with her obesity treatment plan along with follow-up of her obesity related diagnoses. Patty Williams is on keeping a food journal and adhering to recommended goals of 1200 calories and 85 grams of protein daily and states she is following her eating plan approximately 60% of the time. Patty Williams states she is doing physical therapy for 15-20 minutes 7 times per week.  Today's visit was #: 4 Starting weight: 229 lbs Starting date: 06/27/2020 Today's weight: 221 lbs Today's date: 09/12/2020 Total lbs lost to date: 8 Total lbs lost since last in-office visit: 1  Interim History: Patty Williams has struggled more over the last 2-3 weeks with cravings and increased hunger. She has been on prednisone which explains some of her struggles.  Subjective:   1. Pre-diabetes Patty Williams notes polyphagia has increased. She is on a low dose of metformin and she has been on prednisone.  2. Vitamin D deficiency Patty Williams is stable on Vit D, but her level is not yet at goal.  3. At risk of diabetes mellitus Patty Williams is at higher than average risk for developing diabetes due to obesity.   Assessment/Plan:   1. Pre-diabetes Patty Williams agreed to increase metforminXR to 500 mg 2 tablets PO q daily with no refills. She will continue to work on weight loss, exercise, and decreasing simple carbohydrates to help decrease the risk of diabetes.   - metFORMIN (GLUCOPHAGE XR) 500 MG 24 hr tablet; 2 Tabs PO daily  Dispense: 60 tablet; Refill: 0  2. Vitamin D deficiency Low Vitamin D level contributes to fatigue and are associated with obesity, breast, and colon cancer. Patty Williams agreed to continue taking Vitamin D 2,000 IU daily and will follow-up for routine testing of Vitamin D, at least 2-3 times per year to avoid over-replacement.  3. At risk of diabetes mellitus Patty Williams was given approximately 15 minutes of diabetes education and counseling today. We discussed intensive lifestyle  modifications today with an emphasis on weight loss as well as increasing exercise and decreasing simple carbohydrates in her diet. We also reviewed medication options with an emphasis on risk versus benefit of those discussed.   Repetitive spaced learning was employed today to elicit superior memory formation and behavioral change.  4. Obesity with current BMI 36.8 Patty Williams is currently in the action stage of change. As such, her goal is to continue with weight loss efforts. She has agreed to keeping a food journal and adhering to recommended goals of 1200 calories and 85+ grams of protein daily.   Exercise goals: As is.  Behavioral modification strategies: increasing lean protein intake, meal planning and cooking strategies and better snacking choices.  Patty Williams has agreed to follow-up with our clinic in 2 to 3 weeks. She was informed of the importance of frequent follow-up visits to maximize her success with intensive lifestyle modifications for her multiple health conditions.   Objective:   Blood pressure 126/80, pulse 70, temperature 98.1 F (36.7 C), height 5\' 5"  (1.651 m), weight 221 lb (100.2 kg), SpO2 97 %. Body mass index is 36.78 kg/m.  General: Cooperative, alert, well developed, in no acute distress. HEENT: Conjunctivae and lids unremarkable. Cardiovascular: Regular rhythm.  Lungs: Normal work of breathing. Neurologic: No focal deficits.   Lab Results  Component Value Date   CREATININE 0.62 06/27/2020   BUN 16 06/27/2020   NA 142 06/27/2020   K 4.4 06/27/2020   CL 103 06/27/2020   CO2 21 06/27/2020  Lab Results  Component Value Date   ALT 38 (H) 06/27/2020   AST 28 06/27/2020   ALKPHOS 106 06/27/2020   BILITOT 0.3 06/27/2020   Lab Results  Component Value Date   HGBA1C 6.4 05/31/2020   HGBA1C 6.5 12/22/2019   HGBA1C 6.3 12/16/2018   HGBA1C 6.1 12/10/2017   HGBA1C 6.2 12/10/2016   Lab Results  Component Value Date   INSULIN 19.9 06/27/2020   Lab Results   Component Value Date   TSH 1.530 06/27/2020   Lab Results  Component Value Date   CHOL 199 05/31/2020   HDL 47.10 05/31/2020   LDLCALC 133 (H) 05/31/2020   LDLDIRECT 144.2 09/21/2008   TRIG 94.0 05/31/2020   CHOLHDL 4 05/31/2020   Lab Results  Component Value Date   WBC 5.7 06/27/2020   HGB 13.0 06/27/2020   HCT 39.0 06/27/2020   MCV 87 06/27/2020   PLT 268 06/27/2020   Lab Results  Component Value Date   IRON 62 11/09/2008   FERRITIN 3.9 (L) 11/09/2008   Attestation Statements:   Reviewed by clinician on day of visit: allergies, medications, problem list, medical history, surgical history, family history, social history, and previous encounter notes.   I, Trixie Dredge, am acting as transcriptionist for Dennard Nip, MD.  I have reviewed the above documentation for accuracy and completeness, and I agree with the above. -  Dennard Nip, MD

## 2020-09-20 ENCOUNTER — Other Ambulatory Visit: Payer: 59

## 2020-09-27 ENCOUNTER — Ambulatory Visit: Payer: 59 | Admitting: Family Medicine

## 2020-10-03 ENCOUNTER — Ambulatory Visit (INDEPENDENT_AMBULATORY_CARE_PROVIDER_SITE_OTHER): Payer: 59 | Admitting: Physician Assistant

## 2020-10-03 ENCOUNTER — Encounter (INDEPENDENT_AMBULATORY_CARE_PROVIDER_SITE_OTHER): Payer: Self-pay | Admitting: Physician Assistant

## 2020-10-03 ENCOUNTER — Other Ambulatory Visit: Payer: Self-pay

## 2020-10-03 VITALS — BP 108/74 | HR 62 | Temp 98.0°F | Ht 65.0 in | Wt 214.0 lb

## 2020-10-03 DIAGNOSIS — E7849 Other hyperlipidemia: Secondary | ICD-10-CM | POA: Diagnosis not present

## 2020-10-03 DIAGNOSIS — Z6836 Body mass index (BMI) 36.0-36.9, adult: Secondary | ICD-10-CM | POA: Diagnosis not present

## 2020-10-08 NOTE — Progress Notes (Signed)
Chief Complaint:   OBESITY Patty Williams is here to discuss her progress with her obesity treatment plan along with follow-up of her obesity related diagnoses. Patty Williams is on keeping a food journal and adhering to recommended goals of 1200 calories and 85+ grams of protein daily and states she is following her eating plan approximately 75% of the time. Patty Williams states she is doing 0 minutes 0 times per week.  Today's visit was #: 5 Starting weight: 229 lbs Starting date: 06/27/2020 Today's weight: 214 lbs Today's date: 10/03/2020 Total lbs lost to date: 15 Total lbs lost since last in-office visit: 7  Interim History: Patty Williams did well with weight loss. She continues to have hip pain and will see an Orthopedist next month. She is doing physical therapy for her hip. She is journaling 50% of the time.   Subjective:   1. Other hyperlipidemia Ama is taking fish oil, and her last LDL was not at goal.  Assessment/Plan:   1. Other hyperlipidemia Cardiovascular risk and specific lipid/LDL goals reviewed. We discussed several lifestyle modifications today. Patty Williams will continue her meal plan and fish oil, and will continue to work on exercise and weight loss efforts. Orders and follow up as documented in patient record.   Counseling Intensive lifestyle modifications are the first line treatment for this issue. . Dietary changes: Increase soluble fiber. Decrease simple carbohydrates. . Exercise changes: Moderate to vigorous-intensity aerobic activity 150 minutes per week if tolerated. . Lipid-lowering medications: see documented in medical record.  2. Class 2 severe obesity with serious comorbidity and body mass index (BMI) of 36.0 to 36.9 in adult, unspecified obesity type Patty Williams) Patty Williams is currently in the action stage of change. As such, her goal is to continue with weight loss efforts. She has agreed to keeping a food journal and adhering to recommended goals of 1200 calories and 85 grams of protein daily.    Exercise goals: No exercise has been prescribed at this time.  Behavioral modification strategies: meal planning and cooking strategies, ways to avoid boredom eating and keeping a strict food journal.  Patty Williams has agreed to follow-up with our clinic in 2 weeks. She was informed of the importance of frequent follow-up visits to maximize her success with intensive lifestyle modifications for her multiple health conditions.   Objective:   Blood pressure 108/74, pulse 62, temperature 98 F (36.7 C), height 5\' 5"  (1.651 m), weight 214 lb (97.1 kg), SpO2 99 %. Body mass index is 35.61 kg/m.  General: Cooperative, alert, well developed, in no acute distress. HEENT: Conjunctivae and lids unremarkable. Cardiovascular: Regular rhythm.  Lungs: Normal work of breathing. Neurologic: No focal deficits.   Lab Results  Component Value Date   CREATININE 0.62 06/27/2020   BUN 16 06/27/2020   NA 142 06/27/2020   K 4.4 06/27/2020   CL 103 06/27/2020   CO2 21 06/27/2020   Lab Results  Component Value Date   ALT 38 (H) 06/27/2020   AST 28 06/27/2020   ALKPHOS 106 06/27/2020   BILITOT 0.3 06/27/2020   Lab Results  Component Value Date   HGBA1C 6.4 05/31/2020   HGBA1C 6.5 12/22/2019   HGBA1C 6.3 12/16/2018   HGBA1C 6.1 12/10/2017   HGBA1C 6.2 12/10/2016   Lab Results  Component Value Date   INSULIN 19.9 06/27/2020   Lab Results  Component Value Date   TSH 1.530 06/27/2020   Lab Results  Component Value Date   CHOL 199 05/31/2020   HDL 47.10 05/31/2020  LDLCALC 133 (H) 05/31/2020   LDLDIRECT 144.2 09/21/2008   TRIG 94.0 05/31/2020   CHOLHDL 4 05/31/2020   Lab Results  Component Value Date   WBC 5.7 06/27/2020   HGB 13.0 06/27/2020   HCT 39.0 06/27/2020   MCV 87 06/27/2020   PLT 268 06/27/2020   Lab Results  Component Value Date   IRON 62 11/09/2008   FERRITIN 3.9 (L) 11/09/2008   Attestation Statements:   Reviewed by clinician on day of visit: allergies,  medications, problem list, medical history, surgical history, family history, social history, and previous encounter notes.  Time spent on visit including pre-visit chart review and post-visit care and charting was 30 minutes.    Wilhemena Durie, am acting as transcriptionist for [employer], PA-C.  I have reviewed the above documentation for accuracy and completeness, and I agree with the above. Abby Potash, PA-C

## 2020-10-17 ENCOUNTER — Encounter (INDEPENDENT_AMBULATORY_CARE_PROVIDER_SITE_OTHER): Payer: Self-pay | Admitting: Family Medicine

## 2020-10-17 ENCOUNTER — Ambulatory Visit (INDEPENDENT_AMBULATORY_CARE_PROVIDER_SITE_OTHER): Payer: 59 | Admitting: Family Medicine

## 2020-10-17 ENCOUNTER — Other Ambulatory Visit: Payer: Self-pay

## 2020-10-17 VITALS — BP 144/78 | HR 61 | Temp 98.1°F | Ht 65.0 in | Wt 211.0 lb

## 2020-10-17 DIAGNOSIS — Z9189 Other specified personal risk factors, not elsewhere classified: Secondary | ICD-10-CM | POA: Diagnosis not present

## 2020-10-17 DIAGNOSIS — I10 Essential (primary) hypertension: Secondary | ICD-10-CM

## 2020-10-17 DIAGNOSIS — R7303 Prediabetes: Secondary | ICD-10-CM | POA: Diagnosis not present

## 2020-10-17 DIAGNOSIS — Z6838 Body mass index (BMI) 38.0-38.9, adult: Secondary | ICD-10-CM | POA: Diagnosis not present

## 2020-10-17 MED ORDER — METFORMIN HCL ER 500 MG PO TB24: ORAL_TABLET | ORAL | 0 refills | Status: DC

## 2020-10-24 NOTE — Progress Notes (Signed)
Chief Complaint:   OBESITY Patty Williams Williams here to discuss her progress with her obesity treatment plan along with follow-up of her obesity related diagnoses. Patty Williams Williams on keeping a food journal and adhering to recommended goals of 1200 calories and 85 grams of protein daily and states she Williams following her eating plan approximately 70 % of the time. Patty Williams states she Williams doing physical therapy only for 20 minutes 7 times per week.  Today's visit was #: 6 Starting weight: 229 lbs Starting date: 06/27/2020 Today's weight: 211 lbs Today's date: 10/17/2020 Total lbs lost to date: 18 Total lbs lost since last in-office visit: 3  Interim History: Patty Williams continues to do well with weight loss. She will be traveling a lot over the Summer, Patty she fly balloons through there Summer and she Williams making strategies to help her stay on track.  Subjective:   1. Essential hypertension Patty Williams's blood pressure Williams elevated today, which Williams unusual for her. She Williams stable on her medications and no stress.  2. Pre-diabetes Patty Williams Williams stable on metformin, and she Williams doing very well with diet, exercise, and weight loss. She denies nausea or vomiting, and she notes decreased polyphagia.  3. At risk for heart disease Patty Williams Williams at a higher than average risk for cardiovascular disease due to obesity.   Assessment/Plan:   1. Essential hypertension Patty Williams will continue diet and exercise to improve blood pressure control. We will recheck her blood pressure in 3 weeks.  2. Pre-diabetes Patty Williams will continue to work on weight loss, exercise, and decreasing simple carbohydrates to help decrease the risk of diabetes. We will refill metformin for 1 month.  - metFORMIN (GLUCOPHAGE XR) 500 MG 24 hr tablet; 2 Tabs PO daily  Dispense: 60 tablet; Refill: 0  3. At risk for heart disease Patty Williams was given approximately 15 minutes of coronary artery disease prevention counseling today. She Williams 60 y.o. female and has risk factors for heart disease  including obesity. We discussed intensive lifestyle modifications today with an emphasis on specific weight loss instructions and strategies.   Repetitive spaced learning was employed today to elicit superior memory formation and behavioral change.  4. Obesity with current BMI 35.2 Patty Williams Williams currently in the action stage of change. Patty such, her goal Williams to continue with weight loss efforts. She has agreed to keeping a food journal and adhering to recommended goals of 1200 calories and 85+ grams of protein daily.   Protein oatmeal recipes were given in addition to travel protein snacks were discussed.  Exercise goals: Patty Williams.  Behavioral modification strategies: increasing lean protein intake and better snacking choices.  Patty Williams has agreed to follow-up with our clinic in 3 weeks. She was informed of the importance of frequent follow-up visits to maximize her success with intensive lifestyle modifications for her multiple health conditions.   Objective:   Blood pressure (!) 144/78, pulse 61, temperature 98.1 F (36.7 C), height 5\' 5"  (1.651 m), weight 211 lb (95.7 kg), SpO2 97 %. Body mass index Williams 35.11 kg/m.  General: Cooperative, alert, well developed, in no acute distress. HEENT: Conjunctivae and lids unremarkable. Cardiovascular: Regular rhythm.  Lungs: Normal work of breathing. Neurologic: No focal deficits.   Lab Results  Component Value Date   CREATININE 0.62 06/27/2020   BUN 16 06/27/2020   NA 142 06/27/2020   K 4.4 06/27/2020   CL 103 06/27/2020   CO2 21 06/27/2020   Lab Results  Component Value Date  ALT 38 (H) 06/27/2020   AST 28 06/27/2020   ALKPHOS 106 06/27/2020   BILITOT 0.3 06/27/2020   Lab Results  Component Value Date   HGBA1C 6.4 05/31/2020   HGBA1C 6.5 12/22/2019   HGBA1C 6.3 12/16/2018   HGBA1C 6.1 12/10/2017   HGBA1C 6.2 12/10/2016   Lab Results  Component Value Date   INSULIN 19.9 06/27/2020   Lab Results  Component Value Date   TSH 1.530  06/27/2020   Lab Results  Component Value Date   CHOL 199 05/31/2020   HDL 47.10 05/31/2020   LDLCALC 133 (H) 05/31/2020   LDLDIRECT 144.2 09/21/2008   TRIG 94.0 05/31/2020   CHOLHDL 4 05/31/2020   Lab Results  Component Value Date   WBC 5.7 06/27/2020   HGB 13.0 06/27/2020   HCT 39.0 06/27/2020   MCV 87 06/27/2020   PLT 268 06/27/2020   Lab Results  Component Value Date   IRON 62 11/09/2008   FERRITIN 3.9 (L) 11/09/2008   Attestation Statements:   Reviewed by clinician on day of visit: allergies, medications, problem list, medical history, surgical history, family history, social history, and previous encounter notes.   I, Trixie Dredge, am acting Patty transcriptionist for Dennard Nip, MD.  I have reviewed the above documentation for accuracy and completeness, and I agree with the above. -  Dennard Nip, MD

## 2020-11-06 ENCOUNTER — Ambulatory Visit (INDEPENDENT_AMBULATORY_CARE_PROVIDER_SITE_OTHER): Payer: 59 | Admitting: Physician Assistant

## 2020-11-06 ENCOUNTER — Other Ambulatory Visit: Payer: Self-pay

## 2020-11-06 ENCOUNTER — Encounter (INDEPENDENT_AMBULATORY_CARE_PROVIDER_SITE_OTHER): Payer: Self-pay | Admitting: Physician Assistant

## 2020-11-06 VITALS — BP 115/79 | HR 74 | Temp 98.1°F | Ht 65.0 in | Wt 208.0 lb

## 2020-11-06 DIAGNOSIS — E7849 Other hyperlipidemia: Secondary | ICD-10-CM | POA: Diagnosis not present

## 2020-11-06 DIAGNOSIS — Z9189 Other specified personal risk factors, not elsewhere classified: Secondary | ICD-10-CM | POA: Diagnosis not present

## 2020-11-06 DIAGNOSIS — E559 Vitamin D deficiency, unspecified: Secondary | ICD-10-CM

## 2020-11-06 DIAGNOSIS — R7303 Prediabetes: Secondary | ICD-10-CM | POA: Diagnosis not present

## 2020-11-06 DIAGNOSIS — Z6836 Body mass index (BMI) 36.0-36.9, adult: Secondary | ICD-10-CM

## 2020-11-06 MED ORDER — METFORMIN HCL ER 500 MG PO TB24: ORAL_TABLET | ORAL | 0 refills | Status: DC

## 2020-11-07 LAB — HEMOGLOBIN A1C
Est. average glucose Bld gHb Est-mCnc: 131 mg/dL
Hgb A1c MFr Bld: 6.2 % — ABNORMAL HIGH (ref 4.8–5.6)

## 2020-11-07 LAB — COMPREHENSIVE METABOLIC PANEL
ALT: 29 IU/L (ref 0–32)
AST: 22 IU/L (ref 0–40)
Albumin/Globulin Ratio: 1.6 (ref 1.2–2.2)
Albumin: 4 g/dL (ref 3.8–4.9)
Alkaline Phosphatase: 88 IU/L (ref 44–121)
BUN/Creatinine Ratio: 29 — ABNORMAL HIGH (ref 12–28)
BUN: 19 mg/dL (ref 8–27)
Bilirubin Total: 0.2 mg/dL (ref 0.0–1.2)
CO2: 23 mmol/L (ref 20–29)
Calcium: 9.1 mg/dL (ref 8.7–10.3)
Chloride: 105 mmol/L (ref 96–106)
Creatinine, Ser: 0.65 mg/dL (ref 0.57–1.00)
Globulin, Total: 2.5 g/dL (ref 1.5–4.5)
Glucose: 102 mg/dL — ABNORMAL HIGH (ref 65–99)
Potassium: 4.3 mmol/L (ref 3.5–5.2)
Sodium: 143 mmol/L (ref 134–144)
Total Protein: 6.5 g/dL (ref 6.0–8.5)
eGFR: 101 mL/min/{1.73_m2} (ref >59)

## 2020-11-07 LAB — LIPID PANEL
Chol/HDL Ratio: 3.7 ratio (ref 0.0–4.4)
Cholesterol, Total: 206 mg/dL — ABNORMAL HIGH (ref 100–199)
HDL: 56 mg/dL (ref >39)
LDL Chol Calc (NIH): 135 mg/dL — ABNORMAL HIGH (ref 0–99)
Triglycerides: 85 mg/dL (ref 0–149)
VLDL Cholesterol Cal: 15 mg/dL (ref 5–40)

## 2020-11-07 LAB — VITAMIN D 25 HYDROXY (VIT D DEFICIENCY, FRACTURES): Vit D, 25-Hydroxy: 95.8 ng/mL (ref 30.0–100.0)

## 2020-11-07 LAB — INSULIN, RANDOM: INSULIN: 22.6 u[IU]/mL (ref 2.6–24.9)

## 2020-11-07 NOTE — Progress Notes (Signed)
Chief Complaint:   OBESITY Patty Williams is here to discuss her progress with her obesity treatment plan along with follow-up of her obesity related diagnoses. Patty Williams is on keeping a food journal and adhering to recommended goals of 1200 calories and 85+ grams of protein daily and states she is following her eating plan approximately 40% of the time. Patty Williams states she is doing physical therapy for 20 minutes 7 times per week.  Today's visit was #: 7 Starting weight: 229 lbs Starting date: 06/27/2020 Today's weight: 208 lbs Today's date: 11/06/2020 Total lbs lost to date: 21 Total lbs lost since last in-office visit: 3  Interim History: Patty Williams continues to do well with weight loss. She is not journaling her dinner so she tends to fall off of the plan at that meal. She has been stress eating simple carbohydrates.  Subjective:   1. Pre-diabetes Patty Williams is on metformin, and she reports loose stools at times.  2. Vitamin D deficiency Patty Williams is on Vit D 2,000 units daily.  3. Other hyperlipidemia Patty Williams is only taking OTC fish oil.  4. At risk of diabetes mellitus Patty Williams is at higher than average risk for developing diabetes due to obesity.   Assessment/Plan:   1. Pre-diabetes Patty Williams will continue to work on weight loss, exercise, and decreasing simple carbohydrates to help decrease the risk of diabetes. We will check labs today, and we will refill metformin for 1 month.  - metFORMIN (GLUCOPHAGE XR) 500 MG 24 hr tablet; 2 Tabs PO daily  Dispense: 60 tablet; Refill: 0 - Comprehensive metabolic panel - Hemoglobin A1c - Insulin, random  2. Vitamin D deficiency Low Vitamin D level contributes to fatigue and are associated with obesity, breast, and colon cancer. We will check labs today. Patty Williams will continue taking Vitamin D 2,000 IU daily, and will follow-up for routine testing of Vitamin D, at least 2-3 times per year to avoid over-replacement.  - VITAMIN D 25 Hydroxy (Vit-D Deficiency, Fractures)  3.  Other hyperlipidemia Cardiovascular risk and specific lipid/LDL goals reviewed. We discussed several lifestyle modifications today. We will check labs today. Patty Williams will continue to her meal plan, exercise, and weight loss efforts. Orders and follow up as documented in patient record.   Counseling Intensive lifestyle modifications are the first line treatment for this issue. Dietary changes: Increase soluble fiber. Decrease simple carbohydrates. Exercise changes: Moderate to vigorous-intensity aerobic activity 150 minutes per week if tolerated. Lipid-lowering medications: see documented in medical record.  - Lipid panel  4. At risk of diabetes mellitus Patty Williams was given approximately 15 minutes of diabetes education and counseling today. We discussed intensive lifestyle modifications today with an emphasis on weight loss as well as increasing exercise and decreasing simple carbohydrates in her diet. We also reviewed medication options with an emphasis on risk versus benefit of those discussed.   Repetitive spaced learning was employed today to elicit superior memory formation and behavioral change.  5. Class 2 severe obesity with serious comorbidity and body mass index (BMI) of 36.0 to 36.9 in adult, unspecified obesity type Patty Williams is currently in the action stage of change. As such, her goal is to continue with weight loss efforts. She has agreed to keeping a food journal and adhering to recommended goals of 1200 calories and 90 grams of protein daily.   Exercise goals: As is.  Behavioral modification strategies: decreasing simple carbohydrates and meal planning and cooking strategies.  Patty Williams has agreed to follow-up with our clinic in 3 weeks.  She was informed of the importance of frequent follow-up visits to maximize her success with intensive lifestyle modifications for her multiple health conditions.   Patty Williams was informed we would discuss her lab results at her next visit unless there is a  critical issue that needs to be addressed sooner. Patty Williams agreed to keep her next visit at the agreed upon time to discuss these results.  Objective:   Blood pressure 115/79, pulse 74, temperature 98.1 F (36.7 C), height 5\' 5"  (1.651 m), weight 208 lb (94.3 kg), SpO2 100 %. Body mass index is 34.61 kg/m.  General: Cooperative, alert, well developed, in no acute distress. HEENT: Conjunctivae and lids unremarkable. Cardiovascular: Regular rhythm.  Lungs: Normal work of breathing. Neurologic: No focal deficits.   Lab Results  Component Value Date   CREATININE 0.65 11/06/2020   BUN 19 11/06/2020   NA 143 11/06/2020   K 4.3 11/06/2020   CL 105 11/06/2020   CO2 23 11/06/2020   Lab Results  Component Value Date   ALT 29 11/06/2020   AST 22 11/06/2020   ALKPHOS 88 11/06/2020   BILITOT 0.2 11/06/2020   Lab Results  Component Value Date   HGBA1C 6.2 (H) 11/06/2020   HGBA1C 6.4 05/31/2020   HGBA1C 6.5 12/22/2019   HGBA1C 6.3 12/16/2018   HGBA1C 6.1 12/10/2017   Lab Results  Component Value Date   INSULIN 22.6 11/06/2020   INSULIN 19.9 06/27/2020   Lab Results  Component Value Date   TSH 1.530 06/27/2020   Lab Results  Component Value Date   CHOL 206 (H) 11/06/2020   HDL 56 11/06/2020   LDLCALC 135 (H) 11/06/2020   LDLDIRECT 144.2 09/21/2008   TRIG 85 11/06/2020   CHOLHDL 3.7 11/06/2020   Lab Results  Component Value Date   VD25OH 95.8 11/06/2020   VD25OH 81.6 06/27/2020   Lab Results  Component Value Date   WBC 5.7 06/27/2020   HGB 13.0 06/27/2020   HCT 39.0 06/27/2020   MCV 87 06/27/2020   PLT 268 06/27/2020   Lab Results  Component Value Date   IRON 62 11/09/2008   FERRITIN 3.9 (L) 11/09/2008   Attestation Statements:   Reviewed by clinician on day of visit: allergies, medications, problem list, medical history, surgical history, family history, social history, and previous encounter notes.   Wilhemena Durie, am acting as transcriptionist for  [employer], PA-C.  I have reviewed the above documentation for accuracy and completeness, and I agree with the above. Abby Potash, PA-C

## 2020-11-22 ENCOUNTER — Other Ambulatory Visit: Payer: Self-pay | Admitting: Internal Medicine

## 2020-11-28 ENCOUNTER — Ambulatory Visit (INDEPENDENT_AMBULATORY_CARE_PROVIDER_SITE_OTHER): Payer: 59 | Admitting: Family Medicine

## 2020-11-28 ENCOUNTER — Other Ambulatory Visit: Payer: Self-pay

## 2020-11-28 ENCOUNTER — Encounter (INDEPENDENT_AMBULATORY_CARE_PROVIDER_SITE_OTHER): Payer: Self-pay | Admitting: Family Medicine

## 2020-11-28 VITALS — BP 102/71 | HR 71 | Temp 98.1°F | Ht 65.0 in | Wt 205.0 lb

## 2020-11-28 DIAGNOSIS — E1169 Type 2 diabetes mellitus with other specified complication: Secondary | ICD-10-CM | POA: Diagnosis not present

## 2020-11-28 DIAGNOSIS — E559 Vitamin D deficiency, unspecified: Secondary | ICD-10-CM | POA: Diagnosis not present

## 2020-11-28 DIAGNOSIS — Z6838 Body mass index (BMI) 38.0-38.9, adult: Secondary | ICD-10-CM | POA: Diagnosis not present

## 2020-11-28 MED ORDER — METFORMIN HCL ER 500 MG PO TB24: ORAL_TABLET | ORAL | 0 refills | Status: DC

## 2020-12-05 NOTE — Progress Notes (Signed)
Chief Complaint:   OBESITY Patty Williams is here to discuss her progress with her obesity treatment plan along with follow-up of her obesity related diagnoses. Patty Williams is on keeping a food journal and adhering to recommended goals of 1200 calories and 90 grams of protein daily and states she is following her eating plan approximately 60% of the time. Patty Williams states she is doing physical therapy for 10-20 minutes 4 times per week.  Today's visit was #: 8 Starting weight: 229 lbs Starting date: 06/27/2020 Today's weight: 205 lbs Today's date: 11/28/2020 Total lbs lost to date: 24 Total lbs lost since last in-office visit: 3  Interim History: Patty Williams continues to do well with weight loss. She struggles to meet her calories and protein goals at times, especially due to work schedule. She is doing physical therapy for exercise.  Subjective:   1. Vitamin D deficiency Patty Williams is on Vit D OTC and her level is close to becoming over-replaced. I discussed labs with the patient today.  2. Type 2 diabetes mellitus with other specified complication, unspecified whether long term insulin use (HCC) Patty Williams's A1c is improving with diet, exercise, and metformin. She continues to lose weight and trying to increase her activity. I discussed labs with the patient today.  Assessment/Plan:   1. Vitamin D deficiency Low Vitamin D level contributes to fatigue and are associated with obesity, breast, and colon cancer. Patty Williams agreed to discontinue Vitamin D for now, and we will recheck labs in 3-4 month. She will follow-up for routine testing of Vitamin D, at least 2-3 times per year to avoid over-replacement.  2. Type 2 diabetes mellitus with other specified complication, unspecified whether long term insulin use (HCC) We will refill metformin for 1 month. Patty Williams will continue diet and exercise. Good blood sugar control is important to decrease the likelihood of diabetic complications such as nephropathy, neuropathy, limb loss,  blindness, coronary artery disease, and death. Intensive lifestyle modification including diet, exercise and weight loss are the first line of treatment for diabetes.   - metFORMIN (GLUCOPHAGE XR) 500 MG 24 hr tablet; 2 Tabs PO daily  Dispense: 60 tablet; Refill: 0  3. Obesity with current BMI 34.2 Patty Williams is currently in the action stage of change. As such, her goal is to continue with weight loss efforts. She has agreed to keeping a food journal and adhering to recommended goals of 1200 calories and 90+ grams of protein daily.   Exercise goals: As is.  Behavioral modification strategies: increasing lean protein intake and meal planning and cooking strategies.  Patty Williams has agreed to follow-up with our clinic in 3 weeks. She was informed of the importance of frequent follow-up visits to maximize her success with intensive lifestyle modifications for her multiple health conditions.   Objective:   Blood pressure 102/71, pulse 71, temperature 98.1 F (36.7 C), height '5\' 5"'$  (1.651 m), weight 205 lb (93 kg), SpO2 98 %. Body mass index is 34.11 kg/m.  General: Cooperative, alert, well developed, in no acute distress. HEENT: Conjunctivae and lids unremarkable. Cardiovascular: Regular rhythm.  Lungs: Normal work of breathing. Neurologic: No focal deficits.   Lab Results  Component Value Date   CREATININE 0.65 11/06/2020   BUN 19 11/06/2020   NA 143 11/06/2020   K 4.3 11/06/2020   CL 105 11/06/2020   CO2 23 11/06/2020   Lab Results  Component Value Date   ALT 29 11/06/2020   AST 22 11/06/2020   ALKPHOS 88 11/06/2020   BILITOT 0.2  11/06/2020   Lab Results  Component Value Date   HGBA1C 6.2 (H) 11/06/2020   HGBA1C 6.4 05/31/2020   HGBA1C 6.5 12/22/2019   HGBA1C 6.3 12/16/2018   HGBA1C 6.1 12/10/2017   Lab Results  Component Value Date   INSULIN 22.6 11/06/2020   INSULIN 19.9 06/27/2020   Lab Results  Component Value Date   TSH 1.530 06/27/2020   Lab Results  Component Value  Date   CHOL 206 (H) 11/06/2020   HDL 56 11/06/2020   LDLCALC 135 (H) 11/06/2020   LDLDIRECT 144.2 09/21/2008   TRIG 85 11/06/2020   CHOLHDL 3.7 11/06/2020   Lab Results  Component Value Date   VD25OH 95.8 11/06/2020   VD25OH 81.6 06/27/2020   Lab Results  Component Value Date   WBC 5.7 06/27/2020   HGB 13.0 06/27/2020   HCT 39.0 06/27/2020   MCV 87 06/27/2020   PLT 268 06/27/2020   Lab Results  Component Value Date   IRON 62 11/09/2008   FERRITIN 3.9 (L) 11/09/2008   Attestation Statements:   Reviewed by clinician on day of visit: allergies, medications, problem list, medical history, surgical history, family history, social history, and previous encounter notes.   I, Trixie Dredge, am acting as transcriptionist for Dennard Nip, MD.  I have reviewed the above documentation for accuracy and completeness, and I agree with the above. -  Dennard Nip, MD

## 2020-12-16 ENCOUNTER — Encounter (INDEPENDENT_AMBULATORY_CARE_PROVIDER_SITE_OTHER): Payer: Self-pay

## 2020-12-19 ENCOUNTER — Encounter (INDEPENDENT_AMBULATORY_CARE_PROVIDER_SITE_OTHER): Payer: Self-pay

## 2020-12-19 ENCOUNTER — Other Ambulatory Visit: Payer: Self-pay | Admitting: Internal Medicine

## 2020-12-19 ENCOUNTER — Ambulatory Visit (INDEPENDENT_AMBULATORY_CARE_PROVIDER_SITE_OTHER): Payer: 59 | Admitting: Physician Assistant

## 2021-01-09 ENCOUNTER — Other Ambulatory Visit: Payer: Self-pay

## 2021-01-09 ENCOUNTER — Ambulatory Visit (INDEPENDENT_AMBULATORY_CARE_PROVIDER_SITE_OTHER): Payer: 59 | Admitting: Family Medicine

## 2021-01-09 ENCOUNTER — Encounter (INDEPENDENT_AMBULATORY_CARE_PROVIDER_SITE_OTHER): Payer: Self-pay | Admitting: Family Medicine

## 2021-01-09 VITALS — BP 126/70 | HR 64 | Temp 97.9°F | Ht 65.0 in | Wt 205.0 lb

## 2021-01-09 DIAGNOSIS — Z9189 Other specified personal risk factors, not elsewhere classified: Secondary | ICD-10-CM | POA: Diagnosis not present

## 2021-01-09 DIAGNOSIS — E1169 Type 2 diabetes mellitus with other specified complication: Secondary | ICD-10-CM

## 2021-01-09 DIAGNOSIS — Z6838 Body mass index (BMI) 38.0-38.9, adult: Secondary | ICD-10-CM | POA: Diagnosis not present

## 2021-01-09 MED ORDER — METFORMIN HCL ER 500 MG PO TB24
ORAL_TABLET | ORAL | 0 refills | Status: DC
Start: 2021-01-09 — End: 2021-03-25

## 2021-01-09 NOTE — Progress Notes (Signed)
Chief Complaint:   OBESITY Patty Williams is here to discuss her progress with her obesity treatment plan along with follow-up of her obesity related diagnoses. Patty Williams is on keeping a food journal and adhering to recommended goals of 1200 calories and 90+ grams of protein daily and states she is following her eating plan approximately 40-60% of the time. Patty Williams states she is doing 0 minutes 0 times per week.  Today's visit was #: 9 Starting weight: 229 lbs Starting date: 06/27/2020 Today's weight: 205 lbs Today's date: 01/09/2021 Total lbs lost to date: 24 Total lbs lost since last in-office visit: 0  Interim History: Patty Williams has maintained her weight since her last visit approximately 5 weeks ago. 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 She has been skipping meals, and journaling has been especially difficult. She is hoping this will be improving soon.  Subjective:   1. Type 2 diabetes mellitus with other specified complication, unspecified whether long term insulin use (HCC) Patty Williams is working on decreasing simple carbohydrates in her diet. She sometimes misses her metformin due to skipping meals and she is unable to take it on an empty stomach.  2. At risk for impaired metabolic function Patty Williams is at increased risk for impaired metabolic function if skipping meals.  Assessment/Plan:   1. Type 2 diabetes mellitus with other specified complication, unspecified whether long term insulin use ([place]) Patty Williams will continue with diet and exercise, and we will refill metformin for 1 month. Good blood sugar control is important to decrease the likelihood of diabetic complications such as nephropathy, neuropathy, limb loss, blindness, coronary artery disease, and death. Intensive lifestyle modification including diet, exercise and weight loss are the first line of treatment for diabetes.   - metFORMIN (GLUCOPHAGE XR) 500 MG 24 hr tablet; 2 Tabs PO daily  Dispense: 60 tablet;  Refill: 0  2. At risk for impaired metabolic function Patty Williams was given approximately 15 minutes of impaired  metabolic function prevention counseling today. We discussed intensive lifestyle modifications today with an emphasis on specific nutrition and exercise instructions and strategies.   Repetitive spaced learning was employed today to elicit superior memory formation and behavioral change.  3. Obesity with current BMI 34.1 Patty Williams is currently in the action stage of change. As such, her goal is to continue with weight loss efforts. She has agreed to the Category 2 Plan or keeping a food journal and adhering to recommended goals of 1200 calories and 90+ grams of protein daily.   Behavioral modification strategies: no skipping meals, better snacking choices, and planning for success.  Patty Williams has agreed to follow-up with our clinic in 3 weeks. She was informed of the importance of frequent follow-up visits to maximize her success with intensive lifestyle modifications for her multiple health conditions.   Objective:   Blood pressure 126/70, pulse 64, temperature 97.9 F (36.6 C), height '5\' 5"'$  (1.651 m), weight 205 lb (93 kg), SpO2 98 %. Body mass index is 34.11 kg/m.  General: Cooperative, alert, well developed, in no acute distress. HEENT: Conjunctivae and lids unremarkable. Cardiovascular: Regular rhythm.  Lungs: Normal work of breathing. Neurologic: No focal deficits.   Lab Results  Component Value Date   CREATININE 0.65 11/06/2020   BUN 19 11/06/2020   NA 143 11/06/2020   K 4.3 11/06/2020   CL 105 11/06/2020   CO2 23 11/06/2020   Lab Results  Component Value Date   ALT 29 11/06/2020  AST 22 11/06/2020   ALKPHOS 88 11/06/2020   BILITOT 0.2 11/06/2020   Lab Results  Component Value Date   HGBA1C 6.2 (H) 11/06/2020   HGBA1C 6.4 05/31/2020   HGBA1C 6.5 12/22/2019   HGBA1C 6.3 12/16/2018   HGBA1C 6.1 12/10/2017   Lab Results  Component Value Date   INSULIN 22.6  11/06/2020   INSULIN 19.9 06/27/2020   Lab Results  Component Value Date   TSH 1.530 06/27/2020   Lab Results  Component Value Date   CHOL 206 (H) 11/06/2020   HDL 56 11/06/2020   LDLCALC 135 (H) 11/06/2020   LDLDIRECT 144.2 09/21/2008   TRIG 85 11/06/2020   CHOLHDL 3.7 11/06/2020   Lab Results  Component Value Date   VD25OH 95.8 11/06/2020   VD25OH 81.6 06/27/2020   Lab Results  Component Value Date   WBC 5.7 06/27/2020   HGB 13.0 06/27/2020   HCT 39.0 06/27/2020   MCV 87 06/27/2020   PLT 268 06/27/2020   Lab Results  Component Value Date   IRON 62 11/09/2008   FERRITIN 3.9 (L) 11/09/2008   Attestation Statements:   Reviewed by clinician on day of visit: allergies, medications, problem list, medical history, surgical history, family history, social history, and previous encounter notes.   I, Trixie Dredge, am acting as transcriptionist for Dennard Nip, MD.  I have reviewed the above documentation for accuracy and completeness, and I agree with the above. -  Dennard Nip, MD

## 2021-01-23 ENCOUNTER — Other Ambulatory Visit: Payer: 59

## 2021-01-29 ENCOUNTER — Telehealth: Payer: Self-pay | Admitting: Family Medicine

## 2021-01-29 DIAGNOSIS — E559 Vitamin D deficiency, unspecified: Secondary | ICD-10-CM

## 2021-01-29 DIAGNOSIS — I10 Essential (primary) hypertension: Secondary | ICD-10-CM

## 2021-01-29 DIAGNOSIS — Z0000 Encounter for general adult medical examination without abnormal findings: Secondary | ICD-10-CM

## 2021-01-29 DIAGNOSIS — R7303 Prediabetes: Secondary | ICD-10-CM

## 2021-01-29 DIAGNOSIS — E7849 Other hyperlipidemia: Secondary | ICD-10-CM

## 2021-01-29 DIAGNOSIS — Z79899 Other long term (current) drug therapy: Secondary | ICD-10-CM

## 2021-01-29 NOTE — Telephone Encounter (Signed)
-----   Message from Ellamae Sia sent at 01/16/2021 11:22 AM EDT ----- Regarding: Lab orders for Friday, 9.23.22 Patient is scheduled for CPX labs, please order future labs, Thanks , Karna Christmas  Some orders are in

## 2021-01-30 ENCOUNTER — Ambulatory Visit (INDEPENDENT_AMBULATORY_CARE_PROVIDER_SITE_OTHER): Payer: 59 | Admitting: Family Medicine

## 2021-01-31 ENCOUNTER — Encounter: Payer: 59 | Admitting: Family Medicine

## 2021-01-31 ENCOUNTER — Other Ambulatory Visit: Payer: 59

## 2021-02-04 ENCOUNTER — Ambulatory Visit (INDEPENDENT_AMBULATORY_CARE_PROVIDER_SITE_OTHER): Payer: 59 | Admitting: Family Medicine

## 2021-02-07 ENCOUNTER — Encounter: Payer: 59 | Admitting: Family Medicine

## 2021-02-11 ENCOUNTER — Encounter: Payer: Self-pay | Admitting: Family Medicine

## 2021-02-11 ENCOUNTER — Other Ambulatory Visit: Payer: Self-pay

## 2021-02-11 ENCOUNTER — Ambulatory Visit (INDEPENDENT_AMBULATORY_CARE_PROVIDER_SITE_OTHER): Payer: 59 | Admitting: Family Medicine

## 2021-02-11 VITALS — BP 122/81 | HR 68 | Temp 97.7°F | Ht 65.0 in | Wt 207.0 lb

## 2021-02-11 DIAGNOSIS — E559 Vitamin D deficiency, unspecified: Secondary | ICD-10-CM | POA: Diagnosis not present

## 2021-02-11 DIAGNOSIS — R7303 Prediabetes: Secondary | ICD-10-CM | POA: Diagnosis not present

## 2021-02-11 DIAGNOSIS — I10 Essential (primary) hypertension: Secondary | ICD-10-CM | POA: Diagnosis not present

## 2021-02-11 DIAGNOSIS — M519 Unspecified thoracic, thoracolumbar and lumbosacral intervertebral disc disorder: Secondary | ICD-10-CM | POA: Insufficient documentation

## 2021-02-11 DIAGNOSIS — E6609 Other obesity due to excess calories: Secondary | ICD-10-CM

## 2021-02-11 DIAGNOSIS — Z0000 Encounter for general adult medical examination without abnormal findings: Secondary | ICD-10-CM

## 2021-02-11 DIAGNOSIS — Z6834 Body mass index (BMI) 34.0-34.9, adult: Secondary | ICD-10-CM

## 2021-02-11 DIAGNOSIS — E7849 Other hyperlipidemia: Secondary | ICD-10-CM

## 2021-02-11 DIAGNOSIS — J4520 Mild intermittent asthma, uncomplicated: Secondary | ICD-10-CM

## 2021-02-11 DIAGNOSIS — K219 Gastro-esophageal reflux disease without esophagitis: Secondary | ICD-10-CM

## 2021-02-11 DIAGNOSIS — Z79899 Other long term (current) drug therapy: Secondary | ICD-10-CM

## 2021-02-11 LAB — BASIC METABOLIC PANEL
BUN: 13 mg/dL (ref 6–23)
CO2: 28 mEq/L (ref 19–32)
Calcium: 9.4 mg/dL (ref 8.4–10.5)
Chloride: 105 mEq/L (ref 96–112)
Creatinine, Ser: 0.54 mg/dL (ref 0.40–1.20)
GFR: 99.74 mL/min (ref >60.00)
Glucose, Bld: 93 mg/dL (ref 70–99)
Potassium: 3.8 mEq/L (ref 3.5–5.1)
Sodium: 142 mEq/L (ref 135–145)

## 2021-02-11 LAB — LIPID PANEL
Cholesterol: 172 mg/dL (ref 0–200)
HDL: 46.6 mg/dL (ref >39.00)
LDL Cholesterol: 106 mg/dL — ABNORMAL HIGH (ref 0–99)
NonHDL: 125.27
Total CHOL/HDL Ratio: 4
Triglycerides: 94 mg/dL (ref 0.0–149.0)
VLDL: 18.8 mg/dL (ref 0.0–40.0)

## 2021-02-11 LAB — HEMOGLOBIN A1C: Hgb A1c MFr Bld: 6.4 % (ref 4.6–6.5)

## 2021-02-11 MED ORDER — BUDESONIDE-FORMOTEROL FUMARATE 80-4.5 MCG/ACT IN AERO: INHALATION_SPRAY | RESPIRATORY_TRACT | 3 refills | Status: DC

## 2021-02-11 NOTE — Assessment & Plan Note (Signed)
Uses symbicort less than she used to  Doing well

## 2021-02-11 NOTE — Progress Notes (Signed)
Subjective:    Patient ID: Patty Williams, female    DOB: 1960-10-23, 60 y.o.   MRN: [id number]  This visit occurred during the SARS-CoV-2 public health emergency.  Safety protocols were in place, including screening questions prior to the visit, additional usage of staff PPE, and extensive cleaning of exam room while observing appropriate contact time as indicated for disinfecting solutions.   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   Mammogram 10/21 - scheduled in oct at [place] breast exam- no lumps   Colonoscopy 10/20- with 7 y recall for adenomatous polyp   Covid immunized -had a booster  Flu shot - had one   Tdap 3/20 Had shingrix vaccine   Ca and D intake-takes the chews  H/o vit D def- level of 95.8 in June - she held vit D (Dr Leafy Ro) Several adult fractures  Nl dexa in 2017   Takes ppi (protonix) for GERD Lab Results  Component Value Date   VITAMINB12 945 06/27/2020    HTN bp is stable today  No cp or palpitations or headaches or edema  No side effects to medicines  BP Readings from Last 3 Encounters:  02/11/21 122/81  01/09/21 126/70  11/28/20 102/71    Taking benicar 40 mg daily  Not drinking enough fluids -but she is trying  Has to take naproxen right now (at most one daily) -for back problems  Spondylolisthesis ?  Back pain worse after doing a long surgery bent over  Pretty bad back pain-has limited her activities (has to keep moving and gets stiff  after inactivity)  Did PT and dry needling  Prednisone helped when given for something else  Dr Allena Katz ortho  MRI: IMPRESSION: 1. Severe L4-5 facet arthrosis with mild facet edema and trace anterolisthesis. Mild-to-moderate lateral recess and right neural foraminal stenosis. 2. Mild-to-moderate lateral recess stenosis and mild right neural foraminal stenosis at L3-4. 3. Left foraminal and extraforaminal disc protrusion at L2-3 contacting the extraforaminal left L2 nerve.  Mood- frustrated with medical issues  Is doing better however work wise, has some help Has been hot air ballooning recently which she loves   Depression screen [facility] 2/9 06/28/2020 06/27/2020 01/19/2020 12/23/2018 12/17/2017  Decreased Interest 0 1 1 0 0  Down, Depressed, Hopeless 0 2 2 1  0  PHQ - 2 Score 0 3 3 1  0  Altered sleeping - 2 1 1  0  Tired, decreased energy - 2 0 1 1  Change in appetite - 3 1 1 3   Feeling bad or failure about yourself  - 1 3 0 1  Trouble concentrating - 3 1 0 0  Moving slowly or fidgety/restless - 0 0 0 0  Suicidal thoughts - 0 1 0 0  PHQ-9 Score - 14 10 4 5   Difficult doing work/chores - Somewhat difficult - - -  Pulse Readings from Last 3 Encounters:  02/11/21 68  01/09/21 64  11/28/20 71     Lab Results  Component Value Date   CREATININE 0.65 11/06/2020   BUN 19 11/06/2020   NA 143 11/06/2020   K 4.3 11/06/2020   CL 105 11/06/2020   CO2 23 11/06/2020  Wants to re check renal fxn (for naproxen)   Prediabetes Lab Results  Component Value Date   HGBA1C 6.2 (H) 11/06/2020  Down from 6.4 in january Metformin xr 500 mg two pills daily    Hyperlipidemia  Lab Results  Component Value Date   CHOL 206 (H) 11/06/2020   HDL 56 11/06/2020   LDLCALC 135 (H) 11/06/2020   LDLDIRECT 144.2 09/21/2008   TRIG 85 11/06/2020   CHOLHDL 3.7 11/06/2020   Diet controlled She wants to check today - to see if improved with better diet     Thyroid screen Lab Results   Component Value Date   TSH 1.530 06/27/2020     Cbc Lab Results  Component Value Date   WBC 5.7 06/27/2020   HGB 13.0 06/27/2020   HCT 39.0 06/27/2020   MCV 87 06/27/2020   PLT 268 06/27/2020   Liver Lab Results  Component Value Date   ALT 29 11/06/2020   AST 22 11/06/2020   ALKPHOS 88 11/06/2020   BILITOT 0.2 11/06/2020     Patient Active Problem List   Diagnosis Date Noted   Lumbar disc disease 02/11/2021   Other fatigue 06/27/2020   Screening for depression 06/27/2020   Vitamin D deficiency 06/27/2020   At risk for heart disease 06/27/2020   Intermittent diarrhea 03/15/2020   Insomnia 08/04/2019   Hx of adenomatous polyp of colon 03/24/2019   Family history of pulmonary fibrosis 12/23/2018   Family history of kidney disease 12/23/2018   Colon cancer screening 12/23/2018   Vaginal bleeding 09/22/2018   Current use of proton pump inhibitor 10/18/2015   History of fracture 10/18/2015   Left knee pain 04/19/2015   Frequent UTI 10/06/2013   Seasonal and perennial allergic rhinitis 01/01/2013   Routine general medical examination at a health care facility 04/30/2011   Prediabetes    Plantar fasciitis    METATARSALGIA 04/11/2010   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   Obstructive sleep apnea 06/10/2009   GERD 11/09/2008   Hyperlipidemia 07/20/2008   Essential hypertension, benign 03/02/2008   Class 1 obesity due to excess calories with serious comorbidity and body mass index (BMI) of 34.0 to 34.9 in adult 11/18/2007   Allergic asthma, mild intermittent, uncomplicated 37/90/2409   Endometriosis 03/29/2007   Past Medical History:  Diagnosis Date   ADD (attention deficit disorder) 12/2006   adult, pt denies   Anxiety    Asthma    Back pain    Bilateral ovarian cysts    Bruxism    Constipation    Diabetes mellitus type II    mild   Diverticulosis 2014   Mild   Dry mouth    Elevated blood pressure reading without diagnosis of hypertension     Endometriosis    Severe   Fibroids    GERD (gastroesophageal reflux disease)    History of ovarian cyst    HLD (hyperlipidemia)    Hx of adenomatous polyp of colon 03/24/2019   Hyperglycemia    Hyperlipidemia    Hypertension    Iron deficiency anemia    Joint pain    Menorrhagia    Obesity    Obesity  OSA on CPAP    Plantar fasciitis    with orthotics-much difficulty adjusting these   PMB (postmenopausal bleeding)    Pneumonia    history of   PONV (postoperative nausea and vomiting)    Pre-diabetes    Rectovaginal fistula    Sleep apnea    Stress reaction    Uterine adhesion    Uterine fibroid    Wears glasses    Past Surgical History:  Procedure Laterality Date   COLONOSCOPY  01/2009   DILATATION & CURETTAGE/HYSTEROSCOPY WITH MYOSURE N/A 09/22/2018   Procedure: DILATATION & CURETTAGE/HYSTEROSCOPY;  Surgeon: Aloha Gell, MD;  Location: [facility];  Service: Gynecology;  Laterality: N/A;   HEMORRHOID SURGERY     LAPAROSCOPY     endometriosis   TONSILLECTOMY AND ADENOIDECTOMY     TRIGGER FINGER RELEASE     UPPER GI ENDOSCOPY  01/2009   uterine biopsy     uterine cyst exploration     UTERINE SUSPENSION     WISDOM TOOTH EXTRACTION     Social History   Tobacco Use   Smoking status: Never   Smokeless tobacco: Never  Vaping Use   Vaping Use: Never used  Substance Use Topics   Alcohol use: Yes    Alcohol/week: 0.0 standard drinks    Comment: rarely   Drug use: No   Family History  Problem Relation Age of Onset   Cancer Father        prostate   Hypertension Father    Obesity Father    Thyroid disease Mother        ?   Diabetes Mother    Hypertension Mother    Hyperlipidemia Mother    Anxiety disorder Mother    Sleep apnea Mother    Obesity Mother    Heart attack Other        grandfather (in his 88's)   Parkinsonism Other        grandfater   No Known Allergies Current Outpatient Medications on File Prior to Visit  Medication  Sig Dispense Refill   albuterol (VENTOLIN HFA) 108 (90 Base) MCG/ACT inhaler Inhale 2 puffs into the lungs every 6 (six) hours as needed for wheezing or shortness of breath. 18 g 6   docusate sodium (COLACE) 100 MG capsule Take 100 mg by mouth daily as needed for mild constipation.     MAGNESIUM GLYCINATE PO Take 360 mg by mouth daily.     metFORMIN (GLUCOPHAGE XR) 500 MG 24 hr tablet 2 Tabs PO daily 60 tablet 0   Misc Natural Products (GLUCOSAMINE CHOND CMP DOUBLE) TABS Take 3,000 mg by mouth daily.     Multiple Vitamin (MULTIVITAMIN) tablet Take 1 tablet by mouth daily.     naproxen (NAPROSYN) 500 MG tablet Take 500 mg by mouth 2 (two) times daily with a meal.     NON FORMULARY Amberen qd     olmesartan (BENICAR) 40 MG tablet Take 1 tablet (40 mg total) by mouth daily. 90 tablet 3   Omega-3 Fatty Acids (FISH OIL) 1000 MG CAPS Take 2,000 mg by mouth daily.     pantoprazole (PROTONIX) 40 MG tablet Take 1 tablet (40 mg total) by mouth 2 (two) times daily before a meal. Breakfast and supper 60 tablet 1   Turmeric (QC TUMERIC COMPLEX PO) Take 2,000 mg by mouth daily.     No current facility-administered medications on file prior to visit.    Review of Systems  Constitutional:  Positive  for fatigue. Negative for activity change, appetite change, fever and unexpected weight change.  HENT:  Negative for congestion, ear pain, rhinorrhea, sinus pressure and sore throat.   Eyes:  Negative for pain, redness and visual disturbance.  Respiratory:  Negative for cough, shortness of breath and wheezing.   Cardiovascular:  Negative for chest pain and palpitations.  Gastrointestinal:  Negative for abdominal pain, blood in stool, constipation and diarrhea.  Endocrine: Negative for polydipsia and polyuria.  Genitourinary:  Negative for dysuria, frequency and urgency.  Musculoskeletal:  Positive for arthralgias and back pain. Negative for myalgias.  Skin:  Negative for pallor and rash.  Allergic/Immunologic:  Negative for environmental allergies.  Neurological:  Negative for dizziness, syncope and headaches.  Hematological:  Negative for adenopathy. Does not bruise/bleed easily.  Psychiatric/Behavioral:  Negative for decreased concentration and dysphoric mood. The patient is not nervous/anxious.       Objective:   Physical Exam Constitutional:      General: She is not in acute distress.    Appearance: Normal appearance. She is well-developed. She is obese. She is not ill-appearing or diaphoretic.  HENT:     Head: Normocephalic and atraumatic.     Right Ear: Tympanic membrane, ear canal and external ear normal.     Left Ear: Tympanic membrane, ear canal and external ear normal.     Nose: Nose normal. No congestion.     Mouth/Throat:     Mouth: Mucous membranes are moist.     Pharynx: Oropharynx is clear. No posterior oropharyngeal erythema.  Eyes:     General: No scleral icterus.    Extraocular Movements: Extraocular movements intact.     Conjunctiva/sclera: Conjunctivae normal.     Pupils: Pupils are equal, round, and reactive to light.  Neck:     Thyroid: No thyromegaly.     Vascular: No carotid bruit or JVD.  Cardiovascular:     Rate and Rhythm: Normal rate and regular rhythm.     Pulses: Normal pulses.     Heart sounds: Normal heart sounds.    No gallop.  Pulmonary:     Effort: Pulmonary effort is normal. No respiratory distress.     Breath sounds: Normal breath sounds. No wheezing.     Comments: Good air exch Chest:     Chest wall: No tenderness.  Abdominal:     General: Bowel sounds are normal. There is no distension or abdominal bruit.     Palpations: Abdomen is soft. There is no mass.     Tenderness: There is no abdominal tenderness.     Hernia: No hernia is present.  Genitourinary:    Comments:  Gyn Dr Valentino Saxon does breast and pelvic exam yearly Musculoskeletal:        General: No tenderness. Normal range of motion.     Cervical back: Normal range of motion and neck  supple. No rigidity. No muscular tenderness.     Right lower leg: No edema.     Left lower leg: No edema.     Comments: No kyphosis   Lymphadenopathy:     Cervical: No cervical adenopathy.  Skin:    General: Skin is warm and dry.     Coloration: Skin is not pale.     Findings: No erythema or rash.     Comments: Few lentigines    Neurological:     Mental Status: She is alert. Mental status is at baseline.     Cranial Nerves: No cranial nerve deficit.  Motor: No abnormal muscle tone.     Coordination: Coordination normal.     Gait: Gait normal.     Deep Tendon Reflexes: Reflexes are normal and symmetric. Reflexes normal.  Psychiatric:        Mood and Affect: Mood normal.        Cognition and Memory: Cognition and memory normal.     Comments: Pleasant           Assessment & Plan:   Problem List Items Addressed This Visit       Cardiovascular and Mediastinum   Essential hypertension, benign    bp in fair control at this time  BP Readings from Last 1 Encounters:  02/11/21 122/81  No changes needed Most recent labs reviewed  Disc lifstyle change with low sodium diet and exercise  Plan to continue benicar 40 mg daily       Relevant Orders   Basic metabolic panel   Lipid panel     Respiratory   Allergic asthma, mild intermittent, uncomplicated    Uses symbicort less than she used to  Doing well      Relevant Medications   budesonide-formoterol (SYMBICORT) 80-4.5 MCG/ACT inhaler     Digestive   GERD    Continues ppi/protonix daily  Hope with wt loss she may not need this in the future        Musculoskeletal and Integument   Lumbar disc disease    With facet arthrosis and foraminal stenosis  Disc protrusion at L2-3 Reviewed MRI today  Taking naproxen and did PT  Interested in tx options and has seen ortho  Planing a visit to DO  May consider epidural inj if she is a candidate with current findings  Enc her to keep walking        Other    Hyperlipidemia    Disc goals for lipids and reasons to control them Rev last labs with pt Rev low sat fat diet in detail Labs ordered  Would like to avoid statin  Diet has improved      Relevant Orders   Lipid panel   Class 1 obesity due to excess calories with serious comorbidity and body mass index (BMI) of 34.0 to 34.9 in adult    Commended wt loss so far in the healthy weight clinic  Doing well so far  Enc exercise as tolerated in light of back problems       Prediabetes    Lab Results  Component Value Date   HGBA1C 6.2 (H) 11/06/2020  Treated at the healthy weight center Metformin xr 500 mg two pills daily On arb  Not on statin  Diet is improved  She hopes to be off this after wt loss to goal      Relevant Orders   Hemoglobin A1c   Routine general medical examination at a health care facility - Primary    Reviewed health habits including diet and exercise and skin cancer prevention Reviewed appropriate screening tests for age  Also reviewed health mt list, fam hx and immunization status , as well as social and family history   See HPI Labs reviewed and new ones ordered Commended wt loss so far  Enc exercise as tolerated in setting of back problems Mammogram planned at [facility] this month Will send for pap report from gyn Had flu shot recently  utd colonoscopy  covid immunized with booster  Nl dexa in 2017 and no new falls or fractures  Current use of proton pump inhibitor    Lab Results  Component Value Date   [id number] 06/27/2020   D level was high at 95 Watching this  Hopes to come off protonix in the future       Vitamin D deficiency    Monitored by the healthy weight center Holding for now with level of 95

## 2021-02-11 NOTE — Assessment & Plan Note (Signed)
Commended wt loss so far in the healthy weight clinic  Doing well so far  Enc exercise as tolerated in light of back problems

## 2021-02-11 NOTE — Assessment & Plan Note (Signed)
Lab Results  Component Value Date   [id number] 116 06/27/2020    D level was high at 95 Watching this  Hopes to come off protonix in the future

## 2021-02-11 NOTE — Assessment & Plan Note (Signed)
Monitored by the healthy weight center Holding for now with level of 95

## 2021-02-11 NOTE — Assessment & Plan Note (Signed)
bp in fair control at this time  BP Readings from Last 1 Encounters:  02/11/21 122/81   No changes needed Most recent labs reviewed  Disc lifstyle change with low sodium diet and exercise  Plan to continue benicar 40 mg daily

## 2021-02-11 NOTE — Patient Instructions (Addendum)
You may need to consider injections for your back   Please have mammogram at [facility] and have them send [place] a copy   Labs today   Take care of yourself  Keep working on weight loss

## 2021-02-11 NOTE — Assessment & Plan Note (Signed)
Disc goals for lipids and reasons to control them Rev last labs with pt Rev low sat fat diet in detail Labs ordered  Would like to avoid statin  Diet has improved

## 2021-02-11 NOTE — Assessment & Plan Note (Signed)
Lab Results  Component Value Date   HGBA1C 6.2 (H) 11/06/2020   Treated at the healthy weight center Metformin xr 500 mg two pills daily On arb  Not on statin  Diet is improved  She hopes to be off this after wt loss to goal

## 2021-02-11 NOTE — Assessment & Plan Note (Signed)
Reviewed health habits including diet and exercise and skin cancer prevention Reviewed appropriate screening tests for age  Also reviewed health mt list, fam hx and immunization status , as well as social and family history   See HPI Labs reviewed and new ones ordered Commended wt loss so far  Enc exercise as tolerated in setting of back problems Mammogram planned at [facility] this month Will send for pap report from gyn Had flu shot recently  utd colonoscopy  covid immunized with booster  Nl dexa in 2017 and no new falls or fractures

## 2021-02-11 NOTE — Assessment & Plan Note (Signed)
Continues ppi/protonix daily  Hope with wt loss she may not need this in the future

## 2021-02-11 NOTE — Assessment & Plan Note (Signed)
With facet arthrosis and foraminal stenosis  Disc protrusion at L2-3 Reviewed MRI today  Taking naproxen and did PT  Interested in tx options and has seen ortho  Planing a visit to DO  May consider epidural inj if she is a candidate with current findings  Enc her to keep walking

## 2021-02-12 ENCOUNTER — Telehealth: Payer: Self-pay | Admitting: Family Medicine

## 2021-02-12 NOTE — Telephone Encounter (Signed)
Completed in result management.

## 2021-02-12 NOTE — Telephone Encounter (Signed)
Pt returning call

## 2021-02-17 ENCOUNTER — Other Ambulatory Visit (INDEPENDENT_AMBULATORY_CARE_PROVIDER_SITE_OTHER): Payer: Self-pay | Admitting: Family Medicine

## 2021-02-17 DIAGNOSIS — E1169 Type 2 diabetes mellitus with other specified complication: Secondary | ICD-10-CM

## 2021-02-17 NOTE — Telephone Encounter (Signed)
Last OV with Dr. Beasley 

## 2021-02-25 ENCOUNTER — Other Ambulatory Visit: Payer: Self-pay

## 2021-02-25 ENCOUNTER — Ambulatory Visit (INDEPENDENT_AMBULATORY_CARE_PROVIDER_SITE_OTHER): Payer: 59 | Admitting: Family Medicine

## 2021-02-25 ENCOUNTER — Encounter (INDEPENDENT_AMBULATORY_CARE_PROVIDER_SITE_OTHER): Payer: Self-pay | Admitting: Family Medicine

## 2021-02-25 VITALS — BP 109/71 | HR 63 | Temp 97.8°F | Ht 65.0 in | Wt 202.0 lb

## 2021-02-25 DIAGNOSIS — Z6838 Body mass index (BMI) 38.0-38.9, adult: Secondary | ICD-10-CM | POA: Diagnosis not present

## 2021-02-25 DIAGNOSIS — F3289 Other specified depressive episodes: Secondary | ICD-10-CM | POA: Diagnosis not present

## 2021-02-25 NOTE — Progress Notes (Signed)
Chief Complaint:   OBESITY Patty Williams is here to discuss her progress with her obesity treatment plan along with follow-up of her obesity related diagnoses. Patty Williams is on the Category 2 Plan or keeping a food journal and adhering to recommended goals of 1200 calories and 90+ grams of protein daily and states she is following her eating plan approximately 50% of the time. Patty Williams states she is doing physical therapy and cardio for 15 minutes 3 times per week.  Today's visit was #: 10 Starting weight: 229 lbs Starting date: 06/27/2020 Today's weight: 202 lbs Today's date: 02/25/2021 Total lbs lost to date: 27 Total lbs lost since last in-office visit: 3  Interim History: Patty Williams continues to do well with weight loss. She is active but she notes increased sugar cravings, worse in the second half of the day.  Subjective:   1. Other depression with emotional eating Patty Williams notes increased cravings in the second half of the day. She has questions about why this is and how to help decrease her cravings.  Assessment/Plan:   1. Other depression with emotional eating Patty Williams was educated on there mechanism of action of cravings, and how this effects serotonin and other neurotransmitters as well as strategies to help improve her cravings. Orders and follow up as documented in patient record.   2. Obesity with current BMI 33.7 Patty Williams is currently in the action stage of change. As such, her goal is to continue with weight loss efforts. She has agreed to the Category 2 Plan.   Exercise goals: As is.  Behavioral modification strategies: increasing lean protein intake.  Patty Williams has agreed to follow-up with our clinic in 4 weeks. She was informed of the importance of frequent follow-up visits to maximize her success with intensive lifestyle modifications for her multiple health conditions.   Objective:   Blood pressure 109/71, pulse 63, temperature 97.8 F (36.6 C), height 5\' 5"  (1.651 m), weight 202 lb (91.6 kg),  SpO2 98 %. Body mass index is 33.61 kg/m.  General: Cooperative, alert, well developed, in no acute distress. HEENT: Conjunctivae and lids unremarkable. Cardiovascular: Regular rhythm.  Lungs: Normal work of breathing. Neurologic: No focal deficits.   Lab Results  Component Value Date   CREATININE 0.54 02/11/2021   BUN 13 02/11/2021   NA 142 02/11/2021   K 3.8 02/11/2021   CL 105 02/11/2021   CO2 28 02/11/2021   Lab Results  Component Value Date   ALT 29 11/06/2020   AST 22 11/06/2020   ALKPHOS 88 11/06/2020   BILITOT 0.2 11/06/2020   Lab Results  Component Value Date   HGBA1C 6.4 02/11/2021   HGBA1C 6.2 (H) 11/06/2020   HGBA1C 6.4 05/31/2020   HGBA1C 6.5 12/22/2019   HGBA1C 6.3 12/16/2018   Lab Results  Component Value Date   INSULIN 22.6 11/06/2020   INSULIN 19.9 06/27/2020   Lab Results  Component Value Date   TSH 1.530 06/27/2020   Lab Results  Component Value Date   CHOL 172 02/11/2021   HDL 46.60 02/11/2021   LDLCALC 106 (H) 02/11/2021   LDLDIRECT 144.2 09/21/2008   TRIG 94.0 02/11/2021   CHOLHDL 4 02/11/2021   Lab Results  Component Value Date   VD25OH 95.8 11/06/2020   VD25OH 81.6 06/27/2020   Lab Results  Component Value Date   WBC 5.7 06/27/2020   HGB 13.0 06/27/2020   HCT 39.0 06/27/2020   MCV 87 06/27/2020   PLT 268 06/27/2020   Lab Results  Component Value Date   IRON 62 11/09/2008   FERRITIN 3.9 (L) 11/09/2008   Attestation Statements:   Reviewed by clinician on day of visit: allergies, medications, problem list, medical history, surgical history, family history, social history, and previous encounter notes.  Time spent on visit including pre-visit chart review and post-visit care and charting was 33 minutes.    I, Trixie Dredge, am acting as transcriptionist for Dennard Nip, MD.  I have reviewed the above documentation for accuracy and completeness, and I agree with the above. -  Dennard Nip, MD

## 2021-03-07 LAB — HM MAMMOGRAPHY

## 2021-03-10 DIAGNOSIS — G4733 Obstructive sleep apnea (adult) (pediatric): Secondary | ICD-10-CM

## 2021-03-11 NOTE — Telephone Encounter (Signed)
Will forward to Dr. Annamaria Boots to advise along with the other email. Pt is in [place] and is current.

## 2021-03-11 NOTE — Telephone Encounter (Signed)
Dr Annamaria Boots, please advise the following:   Hi Dr. Annamaria Boots, My CPAP shows good mask fit, but I have 2 issues: 1. I wake up most of the time in the mornings with epigastric pain feeling like there is a lot of air in my stomach. I try to burp it out and get relief. It is fairly uncomfortable. What else can we do? 2. My CPAP actually wakes me up 3-4 times a night, blowing too much air. It does not seem to respond to my taking in several full breaths and keeps the high pressure full blast. I have to turn it off, wait a few seconds then turn it back on again. Does this indicate I need to take it for repairs, and do I need any Rx from you regarding that?

## 2021-03-11 NOTE — Telephone Encounter (Signed)
Please order DME [employer]- please reduce autopap range to 8-15, continue mask of choice, humidifier, supplies, airView/ card.   Hope this takes care of all 3 problems.

## 2021-03-25 ENCOUNTER — Telehealth: Payer: Self-pay | Admitting: *Deleted

## 2021-03-25 ENCOUNTER — Encounter (INDEPENDENT_AMBULATORY_CARE_PROVIDER_SITE_OTHER): Payer: Self-pay | Admitting: Family Medicine

## 2021-03-25 ENCOUNTER — Ambulatory Visit (INDEPENDENT_AMBULATORY_CARE_PROVIDER_SITE_OTHER): Payer: 59 | Admitting: Family Medicine

## 2021-03-25 ENCOUNTER — Other Ambulatory Visit: Payer: Self-pay

## 2021-03-25 VITALS — BP 130/79 | HR 60 | Temp 98.5°F | Ht 65.0 in | Wt 205.0 lb

## 2021-03-25 DIAGNOSIS — Z9189 Other specified personal risk factors, not elsewhere classified: Secondary | ICD-10-CM

## 2021-03-25 DIAGNOSIS — E1169 Type 2 diabetes mellitus with other specified complication: Secondary | ICD-10-CM | POA: Diagnosis not present

## 2021-03-25 DIAGNOSIS — I10 Essential (primary) hypertension: Secondary | ICD-10-CM

## 2021-03-25 DIAGNOSIS — Z6838 Body mass index (BMI) 38.0-38.9, adult: Secondary | ICD-10-CM | POA: Diagnosis not present

## 2021-03-25 MED ORDER — OZEMPIC (0.25 OR 0.5 MG/DOSE) 2 MG/1.5ML ~~LOC~~ SOPN: 0.5000 mg | PEN_INJECTOR | SUBCUTANEOUS | 0 refills | Status: DC

## 2021-03-25 MED ORDER — METFORMIN HCL ER 500 MG PO TB24: ORAL_TABLET | ORAL | 0 refills | Status: DC

## 2021-03-25 NOTE — Telephone Encounter (Signed)
Mammo abstracted  

## 2021-03-25 NOTE — Progress Notes (Signed)
Chief Complaint:   OBESITY Patty Williams is here to discuss her progress with her obesity treatment plan along with follow-up of her obesity related diagnoses. Patty Williams is on the Category 2 Plan and states she is following her eating plan approximately 40% of the time. Patty Williams states she is doing light cardio and upper body for 20 minutes 2-3 times per week.  Today's visit was #: 11 Starting weight: 229 lbs Starting date: 06/27/2020 Today's weight: 205 lbs Today's date: 03/25/2021 Total lbs lost to date: 24 Total lbs lost since last in-office visit: 0  Interim History: Patty Williams has been struggling more recently with increased stress in her life. She hasn't been able to concentrate on weight loss as much. She is going on a cruise soon and she would like to discuss vacation strategies.  Subjective:   1. Type 2 diabetes mellitus with other specified complication, unspecified whether long term insulin use (HCC) Patty Williams is on metformin but she still struggles with polyphagia. She is open to discussing other medication options.  2. Essential hypertension Patty Williams's blood pressure is stable on her medications, and she is working on diet and exercise. She denies chest pain.  3. At risk for heart disease Patty Williams is at a higher than average risk for cardiovascular disease due to obesity.   Assessment/Plan:   1. Type 2 diabetes mellitus with other specified complication, unspecified whether long term insulin use ([place]) Patty Williams agreed to start Ozempic 0.25 mg q weekly with no refills, and we will refill metformin for 1 month. Good blood sugar control is important to decrease the likelihood of diabetic complications such as nephropathy, neuropathy, limb loss, blindness, coronary artery disease, and death. Intensive lifestyle modification including diet, exercise and weight loss are the first line of treatment for diabetes.   - metFORMIN (GLUCOPHAGE XR) 500 MG 24 hr tablet; 2 Tabs PO daily  Dispense: 60 tablet; Refill: 0 -  Semaglutide,0.25 or 0.5MG /DOS, (OZEMPIC, 0.25 OR 0.5 MG/DOSE,) 2 MG/1.5ML SOPN; Inject 0.5 mg into the skin once a week.  Dispense: 1.5 mL; Refill: 0  2. Essential hypertension Patty Williams will continue with her diet and exercise, and will continue to monitor as she continues her lifestyle modifications.  3. At risk for heart disease Patty Williams was given approximately 15 minutes of coronary artery disease prevention counseling today. She is 60 y.o. female and has risk factors for heart disease including obesity. We discussed intensive lifestyle modifications today with an emphasis on specific weight loss instructions and strategies.   Repetitive spaced learning was employed today to elicit superior memory formation and behavioral change.  4. Obesity BMI today is 50 Patty Williams is currently in the action stage of change. As such, her goal is to continue with weight loss efforts. She has agreed to the Category 2 Plan.   Exercise goals: As is.  Behavioral modification strategies: increasing lean protein intake and holiday eating strategies .  Patty Williams has agreed to follow-up with our clinic in 4 weeks. She was informed of the importance of frequent follow-up visits to maximize her success with intensive lifestyle modifications for her multiple health conditions.   Objective:   Blood pressure 130/79, pulse 60, temperature 98.5 F (36.9 C), height 5\' 5"  (1.651 m), weight 205 lb (93 kg), SpO2 99 %. Body mass index is 34.11 kg/m.  General: Cooperative, alert, well developed, in no acute distress. HEENT: Conjunctivae and lids unremarkable. Cardiovascular: Regular rhythm.  Lungs: Normal work of breathing. Neurologic: No focal deficits.   Lab Results  Component Value Date   CREATININE 0.54 02/11/2021   BUN 13 02/11/2021   NA 142 02/11/2021   K 3.8 02/11/2021   CL 105 02/11/2021   CO2 28 02/11/2021   Lab Results  Component Value Date   ALT 29 11/06/2020   AST 22 11/06/2020   ALKPHOS 88 11/06/2020    BILITOT 0.2 11/06/2020   Lab Results  Component Value Date   HGBA1C 6.4 02/11/2021   HGBA1C 6.2 (H) 11/06/2020   HGBA1C 6.4 05/31/2020   HGBA1C 6.5 12/22/2019   HGBA1C 6.3 12/16/2018   Lab Results  Component Value Date   INSULIN 22.6 11/06/2020   INSULIN 19.9 06/27/2020   Lab Results  Component Value Date   TSH 1.530 06/27/2020   Lab Results  Component Value Date   CHOL 172 02/11/2021   HDL 46.60 02/11/2021   LDLCALC 106 (H) 02/11/2021   LDLDIRECT 144.2 09/21/2008   TRIG 94.0 02/11/2021   CHOLHDL 4 02/11/2021   Lab Results  Component Value Date   VD25OH 95.8 11/06/2020   VD25OH 81.6 06/27/2020   Lab Results  Component Value Date   WBC 5.7 06/27/2020   HGB 13.0 06/27/2020   HCT 39.0 06/27/2020   MCV 87 06/27/2020   PLT 268 06/27/2020   Lab Results  Component Value Date   IRON 62 11/09/2008   FERRITIN 3.9 (L) 11/09/2008   Attestation Statements:   Reviewed by clinician on day of visit: allergies, medications, problem list, medical history, surgical history, family history, social history, and previous encounter notes.   I, Patty Williams, am acting as transcriptionist for Dennard Nip, MD.  I have reviewed the above documentation for accuracy and completeness, and I agree with the above. -  Dennard Nip, MD

## 2021-04-21 ENCOUNTER — Other Ambulatory Visit (INDEPENDENT_AMBULATORY_CARE_PROVIDER_SITE_OTHER): Payer: Self-pay | Admitting: Family Medicine

## 2021-04-21 ENCOUNTER — Other Ambulatory Visit: Payer: Self-pay | Admitting: Internal Medicine

## 2021-04-21 DIAGNOSIS — E1169 Type 2 diabetes mellitus with other specified complication: Secondary | ICD-10-CM

## 2021-04-21 NOTE — Telephone Encounter (Signed)
Dr.Beasley 

## 2021-04-22 ENCOUNTER — Other Ambulatory Visit: Payer: Self-pay

## 2021-04-22 ENCOUNTER — Ambulatory Visit (INDEPENDENT_AMBULATORY_CARE_PROVIDER_SITE_OTHER): Payer: 59 | Admitting: Family Medicine

## 2021-04-22 ENCOUNTER — Encounter (INDEPENDENT_AMBULATORY_CARE_PROVIDER_SITE_OTHER): Payer: Self-pay | Admitting: Family Medicine

## 2021-04-22 VITALS — BP 139/75 | HR 72 | Temp 98.3°F | Ht 65.0 in | Wt 206.0 lb

## 2021-04-22 DIAGNOSIS — E1169 Type 2 diabetes mellitus with other specified complication: Secondary | ICD-10-CM

## 2021-04-22 DIAGNOSIS — Z6838 Body mass index (BMI) 38.0-38.9, adult: Secondary | ICD-10-CM

## 2021-04-22 DIAGNOSIS — Z9189 Other specified personal risk factors, not elsewhere classified: Secondary | ICD-10-CM | POA: Diagnosis not present

## 2021-04-22 DIAGNOSIS — I10 Essential (primary) hypertension: Secondary | ICD-10-CM

## 2021-04-22 MED ORDER — OZEMPIC (0.25 OR 0.5 MG/DOSE) 2 MG/1.5ML ~~LOC~~ SOPN: 0.5000 mg | PEN_INJECTOR | SUBCUTANEOUS | 0 refills | Status: DC

## 2021-04-22 NOTE — Progress Notes (Signed)
Chief Complaint:   OBESITY Patty Williams is here to discuss her progress with her obesity treatment plan along with follow-up of her obesity related diagnoses. Patty Williams is on the Category 2 Plan and states she is following her eating plan approximately 20% of the time. [name] states she is doing 0 minutes 0 times per week.  Today's visit was #: 12 Starting weight: 229 lbs Starting date: 06/27/2020 Today's weight: 206 lbs Today's date: 04/22/2021 Total lbs lost to date: 23 Total lbs lost since last in-office visit: 0  Interim History: [name] has done well with minimizing her holiday weight gain. She struggles with some excessive hunger and some emotional eating behaviors. She is trying to stay active.  Subjective:   1. Type 2 diabetes mellitus with other specified complication, unspecified whether long term insulin use (HCC) Patty Williams was on metformin and she has been well controlled, but she is still struggling with polyphagia. She is being switched to Ozempic and she has not been on more than 1 medication for diabetes at a time. She has no history of any hypoglycemia.  2. Essential hypertension Patty Williams's blood pressure is stable on Benicar. She will be starting Ozempic which may also help control her blood pressure. She has no history of hypotension.  3. At risk for heart disease Patty Williams is at a higher than average risk for cardiovascular disease due to obesity.   Assessment/Plan:   1. Type 2 diabetes mellitus with other specified complication, unspecified whether long term insulin use [facility]) Patty Williams agreed to discontinue metformin, and she will start Ozempic 0.25 mg q week with no refills. Good blood sugar control is important to decrease the likelihood of diabetic complications such as nephropathy, neuropathy, limb loss, blindness, coronary artery disease, and death. Intensive lifestyle modification including diet, exercise and weight loss are the first line of treatment for diabetes.   - Semaglutide,0.25  or 0.5MG /DOS, (OZEMPIC, 0.25 OR 0.5 MG/DOSE,) 2 MG/1.5ML SOPN; Inject 0.5 mg into the skin once a week.  Dispense: 1.5 mL; Refill: 0  2. Essential hypertension Patty Williams will continue with diet, exercise, and Benicar, and we will follow up at her next office visit.  3. At risk for heart disease Patty Williams was given approximately 15 minutes of coronary artery disease prevention counseling today. She is 60 y.o. female and has risk factors for heart disease including obesity. We discussed intensive lifestyle modifications today with an emphasis on specific weight loss instructions and strategies.   Repetitive spaced learning was employed today to elicit superior memory formation and behavioral change.  4. Obesity BMI today is 87 Patty Williams is currently in the action stage of change. As such, her goal is to continue with weight loss efforts. She has agreed to the Category 2 Plan.   Behavioral modification strategies: increasing lean protein intake and meal planning and cooking strategies.  Patty Williams has agreed to follow-up with our clinic in 3 to 4 weeks. She was informed of the importance of frequent follow-up visits to maximize her success with intensive lifestyle modifications for her multiple health conditions.   Objective:   Blood pressure 139/75, pulse 72, temperature 98.3 F (36.8 C), height 5\' 5"  (1.651 m), weight 206 lb (93.4 kg), SpO2 98 %. Body mass index is 34.28 kg/m.  General: Cooperative, alert, well developed, in no acute distress. HEENT: Conjunctivae and lids unremarkable. Cardiovascular: Regular rhythm.  Lungs: Normal work of breathing. Neurologic: No focal deficits.   Lab Results  Component Value Date   CREATININE 0.54 02/11/2021  BUN 13 02/11/2021   NA 142 02/11/2021   K 3.8 02/11/2021   CL 105 02/11/2021   CO2 28 02/11/2021   Lab Results  Component Value Date   ALT 29 11/06/2020   AST 22 11/06/2020   ALKPHOS 88 11/06/2020   BILITOT 0.2 11/06/2020   Lab Results  Component  Value Date   HGBA1C 6.4 02/11/2021   HGBA1C 6.2 (H) 11/06/2020   HGBA1C 6.4 05/31/2020   HGBA1C 6.5 12/22/2019   HGBA1C 6.3 12/16/2018   Lab Results  Component Value Date   INSULIN 22.6 11/06/2020   INSULIN 19.9 06/27/2020   Lab Results  Component Value Date   TSH 1.530 06/27/2020   Lab Results  Component Value Date   CHOL 172 02/11/2021   HDL 46.60 02/11/2021   LDLCALC 106 (H) 02/11/2021   LDLDIRECT 144.2 09/21/2008   TRIG 94.0 02/11/2021   CHOLHDL 4 02/11/2021   Lab Results  Component Value Date   VD25OH 95.8 11/06/2020   VD25OH 81.6 06/27/2020   Lab Results  Component Value Date   WBC 5.7 06/27/2020   HGB 13.0 06/27/2020   HCT 39.0 06/27/2020   MCV 87 06/27/2020   PLT 268 06/27/2020   Lab Results  Component Value Date   IRON 62 11/09/2008   FERRITIN 3.9 (L) 11/09/2008   Attestation Statements:   Reviewed by clinician on day of visit: allergies, medications, problem list, medical history, surgical history, family history, social history, and previous encounter notes.   I, Trixie Dredge, am acting as transcriptionist for Dennard Nip, MD.  I have reviewed the above documentation for accuracy and completeness, and I agree with the above. -  Dennard Nip, MD

## 2021-04-30 ENCOUNTER — Other Ambulatory Visit: Payer: Self-pay | Admitting: *Deleted

## 2021-04-30 MED ORDER — NAPROXEN 500 MG PO TABS: 500.0000 mg | ORAL_TABLET | Freq: Two times a day (BID) | ORAL | 3 refills | Status: AC | PRN

## 2021-04-30 NOTE — Telephone Encounter (Signed)
Fax refill request. Med is on med list as an historical entry.   Directions on refill says take one tab BID, #60 tabs, please advise   [facility]

## 2021-05-13 ENCOUNTER — Telehealth: Payer: Self-pay | Admitting: Internal Medicine

## 2021-05-14 NOTE — Telephone Encounter (Signed)
Called patient but she did not answer. I could not leave a message. Will attempt to call back later.

## 2021-05-15 NOTE — Telephone Encounter (Signed)
Called and spoke with patient. She was calling to see if we could provide her with a copy of her sleep study from 2008. I was able to locate the SS. Advised her that I could mail a copy to her. Verified her address. She is aware that I will place this in the mail today.   Nothing further needed at time of call.

## 2021-05-19 ENCOUNTER — Other Ambulatory Visit: Payer: Self-pay | Admitting: Family Medicine

## 2021-05-19 ENCOUNTER — Other Ambulatory Visit: Payer: Self-pay | Admitting: Internal Medicine

## 2021-05-20 ENCOUNTER — Other Ambulatory Visit: Payer: Self-pay

## 2021-05-20 ENCOUNTER — Ambulatory Visit (INDEPENDENT_AMBULATORY_CARE_PROVIDER_SITE_OTHER): Payer: 59 | Admitting: Family Medicine

## 2021-05-20 ENCOUNTER — Encounter (INDEPENDENT_AMBULATORY_CARE_PROVIDER_SITE_OTHER): Payer: Self-pay | Admitting: Family Medicine

## 2021-05-20 VITALS — BP 127/80 | HR 68 | Temp 98.5°F | Ht 65.0 in | Wt 203.0 lb

## 2021-05-20 DIAGNOSIS — E669 Obesity, unspecified: Secondary | ICD-10-CM | POA: Diagnosis not present

## 2021-05-20 DIAGNOSIS — E1169 Type 2 diabetes mellitus with other specified complication: Secondary | ICD-10-CM | POA: Diagnosis not present

## 2021-05-20 DIAGNOSIS — Z6833 Body mass index (BMI) 33.0-33.9, adult: Secondary | ICD-10-CM | POA: Diagnosis not present

## 2021-05-20 DIAGNOSIS — Z9189 Other specified personal risk factors, not elsewhere classified: Secondary | ICD-10-CM

## 2021-05-20 DIAGNOSIS — I10 Essential (primary) hypertension: Secondary | ICD-10-CM

## 2021-05-20 DIAGNOSIS — Z6838 Body mass index (BMI) 38.0-38.9, adult: Secondary | ICD-10-CM

## 2021-05-20 MED ORDER — OZEMPIC (0.25 OR 0.5 MG/DOSE) 2 MG/1.5ML ~~LOC~~ SOPN: 0.5000 mg | PEN_INJECTOR | SUBCUTANEOUS | 0 refills | Status: DC

## 2021-05-21 NOTE — Progress Notes (Signed)
Chief Complaint:   OBESITY Patty Williams is here to discuss her progress with her obesity treatment plan along with follow-up of her obesity related diagnoses. Patty Williams is on the Category 2 Plan and states she is following her eating plan approximately 50% of the time. Patty Williams states she is doing intermittent exercise.  Today's visit was #: 13 Starting weight: 229 lbs Starting date: 06/27/2020 Today's weight: 203 lbs Today's date: 05/20/2021 Total lbs lost to date: 26 Total lbs lost since last in-office visit: 3  Interim History: Patty Williams continues to do well with weight loss even over the holidays. Her hunger is controlled and she is working on following her Category 2 plan.  Subjective:   1. Type 2 diabetes mellitus with other specified complication, unspecified whether long term insulin use ([place]) Patty Williams's highest A1c was 6.5 in August 2021. She is stable, and she denies polyphagia, polydipsia, or hypoglycemia. She is stable on Ozempic which is mostly prescribed to help with weight loss.  2. Essential hypertension, benign Patty Williams's blood pressure is stable on her medications. She has no signs of hypotension. She is doing well with diet and exercise.  3. At risk for impaired metabolic function Patty Williams is at increased risk for impaired metabolic function if protein decreases.  Assessment/Plan:   1. Type 2 diabetes mellitus with other specified complication, unspecified whether long term insulin use ([place]) Patty Williams will continue Ozempic 0.5 mg, and we will refill for 1 month. Good blood sugar control is important to decrease the likelihood of diabetic complications such as nephropathy, neuropathy, limb loss, blindness, coronary artery disease, and death. Intensive lifestyle modification including diet, exercise and weight loss are the first line of treatment for diabetes.   - Semaglutide,0.25 or 0.5MG /DOS, (OZEMPIC, 0.25 OR 0.5 MG/DOSE,) 2 MG/1.5ML SOPN; Inject 0.5 mg into the skin once a week.  Dispense: 1.5 mL;  Refill: 0  2. Essential hypertension, benign Patty Williams will continue with diet, exercise, and her medications to improve her blood pressure control. We will continue to follow as she continues her lifestyle modifications.  3. At risk for impaired metabolic function Patty Williams was given approximately 15 minutes of impaired  metabolic function prevention counseling today. We discussed intensive lifestyle modifications today with an emphasis on specific nutrition and exercise instructions and strategies.   Repetitive spaced learning was employed today to elicit superior memory formation and behavioral change.  4. Obesity with current BMI of 33.8 Patty Williams is currently in the action stage of change. As such, her goal is to continue with weight loss efforts. She has agreed to the Category 2 Plan.   Exercise goals: As is.  Behavioral modification strategies: increasing lean protein intake and meal planning and cooking strategies.  Patty Williams has agreed to follow-up with our clinic in 3 to 4 weeks. She was informed of the importance of frequent follow-up visits to maximize her success with intensive lifestyle modifications for her multiple health conditions.   Objective:   Blood pressure 127/80, pulse 68, temperature 98.5 F (36.9 C), height 5\' 5"  (1.651 m), weight 203 lb (92.1 kg), SpO2 98 %. Body mass index is 33.78 kg/m.  General: Cooperative, alert, well developed, in no acute distress. HEENT: Conjunctivae and lids unremarkable. Cardiovascular: Regular rhythm.  Lungs: Normal work of breathing. Neurologic: No focal deficits.   Lab Results  Component Value Date   CREATININE 0.54 02/11/2021   BUN 13 02/11/2021   NA 142 02/11/2021   K 3.8 02/11/2021   CL 105 02/11/2021   CO2 28  02/11/2021   Lab Results  Component Value Date   ALT 29 11/06/2020   AST 22 11/06/2020   ALKPHOS 88 11/06/2020   BILITOT 0.2 11/06/2020   Lab Results  Component Value Date   HGBA1C 6.4 02/11/2021   HGBA1C 6.2 (H)  11/06/2020   HGBA1C 6.4 05/31/2020   HGBA1C 6.5 12/22/2019   HGBA1C 6.3 12/16/2018   Lab Results  Component Value Date   INSULIN 22.6 11/06/2020   INSULIN 19.9 06/27/2020   Lab Results  Component Value Date   TSH 1.530 06/27/2020   Lab Results  Component Value Date   CHOL 172 02/11/2021   HDL 46.60 02/11/2021   LDLCALC 106 (H) 02/11/2021   LDLDIRECT 144.2 09/21/2008   TRIG 94.0 02/11/2021   CHOLHDL 4 02/11/2021   Lab Results  Component Value Date   VD25OH 95.8 11/06/2020   VD25OH 81.6 06/27/2020   Lab Results  Component Value Date   WBC 5.7 06/27/2020   HGB 13.0 06/27/2020   HCT 39.0 06/27/2020   MCV 87 06/27/2020   PLT 268 06/27/2020   Lab Results  Component Value Date   IRON 62 11/09/2008   FERRITIN 3.9 (L) 11/09/2008   Attestation Statements:   Reviewed by clinician on day of visit: allergies, medications, problem list, medical history, surgical history, family history, social history, and previous encounter notes.   I, Trixie Dredge, am acting as transcriptionist for Dennard Nip, MD.  I have reviewed the above documentation for accuracy and completeness, and I agree with the above. -  Dennard Nip, MD

## 2021-06-10 ENCOUNTER — Ambulatory Visit (INDEPENDENT_AMBULATORY_CARE_PROVIDER_SITE_OTHER): Payer: 59 | Admitting: Family Medicine

## 2021-06-10 ENCOUNTER — Other Ambulatory Visit: Payer: Self-pay

## 2021-06-10 ENCOUNTER — Encounter (INDEPENDENT_AMBULATORY_CARE_PROVIDER_SITE_OTHER): Payer: Self-pay | Admitting: Family Medicine

## 2021-06-10 VITALS — BP 106/67 | HR 71 | Temp 98.3°F | Ht 65.0 in | Wt 200.0 lb

## 2021-06-10 DIAGNOSIS — E1169 Type 2 diabetes mellitus with other specified complication: Secondary | ICD-10-CM

## 2021-06-10 DIAGNOSIS — Z6833 Body mass index (BMI) 33.0-33.9, adult: Secondary | ICD-10-CM | POA: Diagnosis not present

## 2021-06-10 DIAGNOSIS — E669 Obesity, unspecified: Secondary | ICD-10-CM | POA: Diagnosis not present

## 2021-06-10 DIAGNOSIS — Z6838 Body mass index (BMI) 38.0-38.9, adult: Secondary | ICD-10-CM

## 2021-06-10 MED ORDER — OZEMPIC (0.25 OR 0.5 MG/DOSE) 2 MG/1.5ML ~~LOC~~ SOPN: 0.5000 mg | PEN_INJECTOR | SUBCUTANEOUS | 0 refills | Status: DC

## 2021-06-10 NOTE — Progress Notes (Signed)
Chief Complaint:   OBESITY Patty Williams is here to discuss her progress with her obesity treatment plan along with follow-up of her obesity related diagnoses. Patty Williams is on the Category 2 Plan and states she is following her eating plan approximately 50% of the time. Patty Williams states she is doing 0 minutes 0 times per week.  Today's visit was #: 14 Starting weight: 229 lbs Starting date: 06/27/2020 Today's weight: 200 lbs Today's date: 06/10/2021 Total lbs lost to date: 29 Total lbs lost since last in-office visit: 3  Interim History: Patty Williams continues to do well with weight loss on her Category 2 plan. She is limited on exercise due to spondylolisthesis and bulging disks.   Subjective:   1. Type 2 diabetes mellitus with other specified complication, unspecified whether long term insulin use (Patty Williams) Patty Williams continues to do well with weight loss. She is decreasing simple carbohydrates in her diet, and she notes decreased polyphagia. She denies polyuria or polydipsia.   Assessment/Plan:   1. Type 2 diabetes mellitus with other specified complication, unspecified whether long term insulin use (Patty Williams) Patty Williams will continue Ozempic, and we will refill for 1 month. Good blood sugar control is important to decrease the likelihood of diabetic complications such as nephropathy, neuropathy, limb loss, blindness, coronary artery disease, and death. Intensive lifestyle modification including diet, exercise and weight loss are the first line of treatment for diabetes.   - Semaglutide,0.25 or 0.5MG /DOS, (OZEMPIC, 0.25 OR 0.5 MG/DOSE,) 2 MG/1.5ML SOPN; Inject 0.5 mg into the skin once a week.  Dispense: 1.5 mL; Refill: 0  2. Obesity with current BMI of 33.4 Patty Williams is currently in the action stage of change. As such, her goal is to continue with weight loss efforts. She has agreed to the Category 2 Plan or keeping a food journal and adhering to recommended goals of 1200 calories and 75+ grams of protein daily.   Behavioral  modification strategies: increasing lean protein intake and increasing high fiber foods.  Patty Williams has agreed to follow-up with our clinic in 3 to 4 weeks. She was informed of the importance of frequent follow-up visits to maximize her success with intensive lifestyle modifications for her multiple health conditions.   Objective:   Blood pressure 106/67, pulse 71, temperature 98.3 F (36.8 C), height 5\' 5"  (1.651 m), weight 200 lb (90.7 kg), SpO2 99 %. Body mass index is 33.28 kg/m.  General: Cooperative, alert, well developed, in no acute distress. HEENT: Conjunctivae and lids unremarkable. Cardiovascular: Regular rhythm.  Lungs: Normal work of breathing. Neurologic: No focal deficits.   Lab Results  Component Value Date   CREATININE 0.54 02/11/2021   BUN 13 02/11/2021   NA 142 02/11/2021   K 3.8 02/11/2021   CL 105 02/11/2021   CO2 28 02/11/2021   Lab Results  Component Value Date   ALT 29 11/06/2020   AST 22 11/06/2020   ALKPHOS 88 11/06/2020   BILITOT 0.2 11/06/2020   Lab Results  Component Value Date   HGBA1C 6.4 02/11/2021   HGBA1C 6.2 (H) 11/06/2020   HGBA1C 6.4 05/31/2020   HGBA1C 6.5 12/22/2019   HGBA1C 6.3 12/16/2018   Lab Results  Component Value Date   INSULIN 22.6 11/06/2020   INSULIN 19.9 06/27/2020   Lab Results  Component Value Date   TSH 1.530 06/27/2020   Lab Results  Component Value Date   CHOL 172 02/11/2021   HDL 46.60 02/11/2021   LDLCALC 106 (H) 02/11/2021   LDLDIRECT 144.2 09/21/2008  TRIG 94.0 02/11/2021   CHOLHDL 4 02/11/2021   Lab Results  Component Value Date   VD25OH 95.8 11/06/2020   VD25OH 81.6 06/27/2020   Lab Results  Component Value Date   WBC 5.7 06/27/2020   HGB 13.0 06/27/2020   HCT 39.0 06/27/2020   MCV 87 06/27/2020   PLT 268 06/27/2020   Lab Results  Component Value Date   IRON 62 11/09/2008   FERRITIN 3.9 (L) 11/09/2008   Attestation Statements:   Reviewed by clinician on day of visit: allergies,  medications, problem list, medical history, surgical history, family history, social history, and previous encounter notes.  Time spent on visit including pre-visit chart review and post-visit care and charting was 30 minutes.    I, Trixie Dredge, am acting as transcriptionist for Dennard Nip, MD.  I have reviewed the above documentation for accuracy and completeness, and I agree with the above. -  Dennard Nip, MD

## 2021-06-16 ENCOUNTER — Other Ambulatory Visit: Payer: Self-pay | Admitting: Internal Medicine

## 2021-06-16 ENCOUNTER — Encounter (INDEPENDENT_AMBULATORY_CARE_PROVIDER_SITE_OTHER): Payer: Self-pay | Admitting: Family Medicine

## 2021-06-16 NOTE — Progress Notes (Signed)
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 No room to reduce pressure range without some loss of control. Uses Symbicort once daily. Hasn't used her rescue inhaler in years but we will leave it on her list. No exacerbation. Continuing to work as Pharmacist, community. She is concerned that this and Mother's death from ILD put her in high risk for for ILD herself.  CXR 06/14/20-  FINDINGS: The heart size and mediastinal contours are within normal limits. Both lungs are clear. The visualized skeletal structures are unremarkable. No evidence of interstitial lung disease.  IMPRESSION: No active cardiopulmonary disease.  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 She has 2 CPAP machines, 1 of which is a travel machine for which there is no download capability.  She uses 1 over the other every night with definite benefit and no concerns.  Does think she would like lower pressure setting and we are going  to try reducing to 8-14.  She is sleeping well with no daytime fatigue. Asthma control has been excellent with no significant exacerbation and no cardiac problems.  ROS-see HPI   + = positive Constitutional:    weight loss, night sweats, fevers, chills, fatigue, lassitude. HEENT:    headaches, difficulty swallowing, tooth/dental problems, sore throat,       sneezing, itching, ear ache, nasal congestion, post nasal drip, snoring CV:    chest pain, orthopnea, PND, swelling in lower extremities, anasarca,                                   dizziness, palpitations Resp:   +shortness of breath with exertion or at rest.                productive cough,   non-productive cough, coughing up of blood.              change in color of mucus.  wheezing.   Skin:    rash or lesions. GI:    +heartburn, indigestion, abdominal pain, nausea, vomiting, diarrhea,                 change in bowel habits, loss of appetite  GU: dysuria, change in color of urine, no urgency or frequency,  flank pain. MS:   joint pain, stiffness, decreased range of motion, back pain. Neuro-     nothing unusual Psych:  change in mood or affect.  depression or anxiety.   memory loss.  OBJ- Physical Exam General- Alert, Oriented, Affect-appropriate, Distress- none acute, + overweight Skin- rash-none, lesions- none, excoriation- none Lymphadenopathy- none Head- atraumatic            Eyes- Gross vision intact, PERRLA, conjunctivae and secretions clear            Ears- Hearing, canals-normal            Nose- Clear, no-Septal dev, mucus, polyps, erosion, perforation             Throat- Mallampati II-III , mucosa clear , drainage- none, tonsils- atrophic Neck- flexible , trachea midline, no stridor , thyroid nl, carotid no bruit Chest - symmetrical excursion , unlabored           Heart/CV- RRR , no murmur , no gallop  , no rub, nl s1 s2                           - JVD- none , edema- none, stasis changes- none, varices- none            Lung- clear to P&A- no crackles, wheeze- none, cough- none , dullness-none, rub- none           Chest wall-  Abd-  Br/ Gen/ Rectal- Not done, not indicated Extrem- cyanosis- none, clubbing, none, atrophy- none, strength- nl Neuro- grossly intact to observation

## 2021-06-16 NOTE — Telephone Encounter (Signed)
Please see message and advise.  Thank you. ° °

## 2021-06-17 ENCOUNTER — Encounter (INDEPENDENT_AMBULATORY_CARE_PROVIDER_SITE_OTHER): Payer: Self-pay | Admitting: Family Medicine

## 2021-06-17 ENCOUNTER — Encounter: Payer: Self-pay | Admitting: Internal Medicine

## 2021-06-17 ENCOUNTER — Ambulatory Visit (INDEPENDENT_AMBULATORY_CARE_PROVIDER_SITE_OTHER): Payer: 59 | Admitting: Internal Medicine

## 2021-06-17 ENCOUNTER — Other Ambulatory Visit: Payer: Self-pay

## 2021-06-17 DIAGNOSIS — G4733 Obstructive sleep apnea (adult) (pediatric): Secondary | ICD-10-CM | POA: Diagnosis not present

## 2021-06-17 DIAGNOSIS — J4520 Mild intermittent asthma, uncomplicated: Secondary | ICD-10-CM

## 2021-06-17 NOTE — Telephone Encounter (Signed)
Please see message and advise.  Thank you. ° °

## 2021-06-17 NOTE — Patient Instructions (Addendum)
Order- [facility]- please reduce CPAP auto range to 8-14, continue mask of choice, humidifier, supplies, AirView/ card  We will mail your [place] paper work to your home.

## 2021-06-18 ENCOUNTER — Encounter: Payer: Self-pay | Admitting: Internal Medicine

## 2021-06-18 DIAGNOSIS — G4733 Obstructive sleep apnea (adult) (pediatric): Secondary | ICD-10-CM

## 2021-06-18 NOTE — Assessment & Plan Note (Signed)
She benefits from CPAP with improved sleep and excellent compliance and control.  We will work with her to clarify what information is needed for the [place], then required forms will be mailed to her.

## 2021-06-18 NOTE — Assessment & Plan Note (Signed)
Mild intermittent uncomplicated.  Under very good control with only rare need for rescue inhaler.

## 2021-06-24 ENCOUNTER — Encounter: Payer: Self-pay | Admitting: Internal Medicine

## 2021-06-25 ENCOUNTER — Encounter: Payer: Self-pay | Admitting: Internal Medicine

## 2021-06-25 DIAGNOSIS — G4733 Obstructive sleep apnea (adult) (pediatric): Secondary | ICD-10-CM

## 2021-06-25 NOTE — Telephone Encounter (Signed)
Please see pt's email about her OSA. Thank you!

## 2021-06-25 NOTE — Telephone Encounter (Signed)
Will also forward to Dr. Annamaria Boots as this is more info related to other email encounter.

## 2021-06-27 NOTE — Telephone Encounter (Signed)
Dr. Annamaria Boots, please advise on pt's email. Pt wants to do an in lab study and not a home study. Thanks.

## 2021-06-30 NOTE — Telephone Encounter (Signed)
Please change sleep study order for Dr Wilkie Aye to In-Center Split Night for dx OSA. If they can get it done by end of this month, all the better as she requests.

## 2021-06-30 NOTE — Telephone Encounter (Signed)
Called and spoke with patient to let her know we have her paperwork ready. She asked to have it mailed to her and she confirmed her address. She states that she is waiting to get a sleep study done. Order has been placed for that. Advised her that [facility] will call her to get it scheduled. Once she has it done we will touch base with her in regards to results and probably get her scheduled for appt. Patient expressed understanding. Nothing further needed at this time.

## 2021-07-01 ENCOUNTER — Telehealth: Payer: Self-pay | Admitting: Internal Medicine

## 2021-07-03 NOTE — Telephone Encounter (Signed)
Spoke to patient and relayed below message.  She will contact [facility] sleep center and reschedule sleep study for May.  Nothing further needed at this time.

## 2021-07-03 NOTE — Telephone Encounter (Signed)
I'm not sure if I have addressed all questions yet. I don't usually expects nasal septoplasty surgery by itself to change sleep study results much, but it may make CPAP work better if CPAP is needed.

## 2021-07-03 NOTE — Telephone Encounter (Signed)
Spoke to patient.  She plans to have surgery on soft pallet and deviated septum. She would like to postpone sleep study until after surgery. Patient will call sleep center to reschedule scheduled sleep study.    Dr. Annamaria Boots, please advise. Thanks

## 2021-07-03 NOTE — Telephone Encounter (Signed)
We can cancel sleep study for now and she can contact us if she wants to reschedule

## 2021-07-07 ENCOUNTER — Other Ambulatory Visit (INDEPENDENT_AMBULATORY_CARE_PROVIDER_SITE_OTHER): Payer: Self-pay | Admitting: Family Medicine

## 2021-07-07 DIAGNOSIS — E1169 Type 2 diabetes mellitus with other specified complication: Secondary | ICD-10-CM

## 2021-07-07 NOTE — Telephone Encounter (Signed)
Dr.Beasley 

## 2021-07-08 ENCOUNTER — Other Ambulatory Visit: Payer: Self-pay

## 2021-07-08 ENCOUNTER — Encounter (INDEPENDENT_AMBULATORY_CARE_PROVIDER_SITE_OTHER): Payer: Self-pay | Admitting: Family Medicine

## 2021-07-08 ENCOUNTER — Ambulatory Visit (INDEPENDENT_AMBULATORY_CARE_PROVIDER_SITE_OTHER): Payer: 59 | Admitting: Family Medicine

## 2021-07-08 VITALS — BP 118/76 | HR 69 | Temp 98.0°F | Ht 65.0 in | Wt 198.0 lb

## 2021-07-08 DIAGNOSIS — E669 Obesity, unspecified: Secondary | ICD-10-CM | POA: Diagnosis not present

## 2021-07-08 DIAGNOSIS — E559 Vitamin D deficiency, unspecified: Secondary | ICD-10-CM

## 2021-07-08 DIAGNOSIS — E7849 Other hyperlipidemia: Secondary | ICD-10-CM | POA: Diagnosis not present

## 2021-07-08 DIAGNOSIS — E1169 Type 2 diabetes mellitus with other specified complication: Secondary | ICD-10-CM | POA: Diagnosis not present

## 2021-07-08 DIAGNOSIS — Z7985 Long-term (current) use of injectable non-insulin antidiabetic drugs: Secondary | ICD-10-CM

## 2021-07-08 DIAGNOSIS — Z6838 Body mass index (BMI) 38.0-38.9, adult: Secondary | ICD-10-CM

## 2021-07-08 DIAGNOSIS — Z6833 Body mass index (BMI) 33.0-33.9, adult: Secondary | ICD-10-CM

## 2021-07-08 DIAGNOSIS — Z9189 Other specified personal risk factors, not elsewhere classified: Secondary | ICD-10-CM

## 2021-07-08 MED ORDER — SEMAGLUTIDE (1 MG/DOSE) 4 MG/3ML ~~LOC~~ SOPN: 1.0000 mg | PEN_INJECTOR | SUBCUTANEOUS | 0 refills | Status: DC

## 2021-07-09 LAB — CMP14+EGFR
ALT: 31 IU/L (ref 0–32)
AST: 20 IU/L (ref 0–40)
Albumin/Globulin Ratio: 1.7 (ref 1.2–2.2)
Albumin: 4.3 g/dL (ref 3.8–4.8)
Alkaline Phosphatase: 89 IU/L (ref 44–121)
BUN/Creatinine Ratio: 23 (ref 12–28)
BUN: 15 mg/dL (ref 8–27)
Bilirubin Total: 0.3 mg/dL (ref 0.0–1.2)
CO2: 25 mmol/L (ref 20–29)
Calcium: 9.5 mg/dL (ref 8.7–10.3)
Chloride: 107 mmol/L — ABNORMAL HIGH (ref 96–106)
Creatinine, Ser: 0.65 mg/dL (ref 0.57–1.00)
Globulin, Total: 2.5 g/dL (ref 1.5–4.5)
Glucose: 92 mg/dL (ref 70–99)
Potassium: 4.4 mmol/L (ref 3.5–5.2)
Sodium: 146 mmol/L — ABNORMAL HIGH (ref 134–144)
Total Protein: 6.8 g/dL (ref 6.0–8.5)
eGFR: 100 mL/min/{1.73_m2} (ref >59)

## 2021-07-09 LAB — VITAMIN D 25 HYDROXY (VIT D DEFICIENCY, FRACTURES): Vit D, 25-Hydroxy: 54.9 ng/mL (ref 30.0–100.0)

## 2021-07-09 LAB — LIPID PANEL WITH LDL/HDL RATIO
Cholesterol, Total: 205 mg/dL — ABNORMAL HIGH (ref 100–199)
HDL: 45 mg/dL (ref >39)
LDL Chol Calc (NIH): 145 mg/dL — ABNORMAL HIGH (ref 0–99)
LDL/HDL Ratio: 3.2 ratio (ref 0.0–3.2)
Triglycerides: 84 mg/dL (ref 0–149)
VLDL Cholesterol Cal: 15 mg/dL (ref 5–40)

## 2021-07-09 LAB — INSULIN, RANDOM: INSULIN: 13.2 u[IU]/mL (ref 2.6–24.9)

## 2021-07-09 LAB — HEMOGLOBIN A1C
Est. average glucose Bld gHb Est-mCnc: 123 mg/dL
Hgb A1c MFr Bld: 5.9 % — ABNORMAL HIGH (ref 4.8–5.6)

## 2021-07-09 NOTE — Progress Notes (Signed)
Chief Complaint:   OBESITY Patty Williams is here to discuss her progress with her obesity treatment plan along with follow-up of her obesity related diagnoses. Patty Williams is on the Category 2 Plan or keeping a food journal and adhering to recommended goals of 1200 calories and 75+ grams of protein daily and states she is following her eating plan approximately 60% of the time. Patty Williams states she is doing 0 minutes 0 times per week.  Today's visit was #: 15 Starting weight: 229 lbs Starting date: 06/27/2020 Today's weight: 198 lbs Today's date: 07/08/2021 Total lbs lost to date: 31 Total lbs lost since last in-office visit: 2  Interim History: Patty Williams continues to do well with weight loss on her plan. She notes some increase in sugar cravings in the PM. She is trying to decrease inflammation and has questions about what foods can help with this.  Subjective:   1. Type 2 diabetes mellitus with other specified complication, unspecified whether long term insulin use ([place]) Caleb's last A1c was at 6.4. She is working on diet and exercise. She notes some increase in PM polyphagia recently.  2. Other hyperlipidemia Patty Williams is working on diet and exercise. She is due for labs, and she denies chest pain.  3. Vitamin D deficiency Patty Williams's last Vit D level was elevated. She is not on Vit D currently.  4. At risk for heart disease Patty Williams is at higher than average risk for cardiovascular disease due to obesity.  Assessment/Plan:   1. Type 2 diabetes mellitus with other specified complication, unspecified whether long term insulin use (HCC) We will check labs today. Patty Williams agreed to increase Ozempic to 1 mg weekly with no refills. Good blood sugar control is important to decrease the likelihood of diabetic complications such as nephropathy, neuropathy, limb loss, blindness, coronary artery disease, and death. Intensive lifestyle modification including diet, exercise and weight loss are the first line of treatment for  diabetes.   - CMP14+EGFR - Insulin, random - Hemoglobin A1c - Semaglutide, 1 MG/DOSE, 4 MG/3ML SOPN; Inject 1 mg as directed once a week.  Dispense: 3 mL; Refill: 0  2. Other hyperlipidemia Cardiovascular risk and specific lipid/LDL goals reviewed. We discussed several lifestyle modifications today. We will check labs today. Patty Williams will continue to work on diet, exercise and weight loss efforts. Orders and follow up as documented in patient record.   - Lipid Panel With LDL/HDL Ratio  3. Vitamin D deficiency Low Vitamin D level contributes to fatigue and are associated with obesity, breast, and colon cancer. We will check labs today. Patty Williams will follow-up for routine testing of Vitamin D, at least 2-3 times per year to avoid over-replacement.  - VITAMIN D 25 Hydroxy (Vit-D Deficiency, Fractures)  4. At risk for heart disease Patty Williams was given approximately 15 minutes of coronary artery disease prevention counseling today. She is 61 y.o. female and has risk factors for heart disease including obesity. We discussed intensive lifestyle modifications today with an emphasis on specific weight loss instructions and strategies.  Repetitive spaced learning was employed today to elicit superior memory formation and behavioral change.   5. Obesity with current BMI of 33.0 Patty Williams is currently in the action stage of change. As such, her goal is to continue with weight loss efforts. She has agreed to the Category 2 Plan.   Antioxidant rich foods were discussed and the importance of decreasing simple carbohydrates were discussed.   Behavioral modification strategies: increasing lean protein intake, decreasing simple carbohydrates, and meal  planning and cooking strategies.  Patty Williams has agreed to follow-up with our clinic in 4 weeks. She was informed of the importance of frequent follow-up visits to maximize her success with intensive lifestyle modifications for her multiple health conditions.   Patty Williams was informed  we would discuss her lab results at her next visit unless there is a critical issue that needs to be addressed sooner. Patty Williams agreed to keep her next visit at the agreed upon time to discuss these results.  Objective:   Blood pressure 118/76, pulse 69, temperature 98 F (36.7 C), height 5' 5"  (1.651 m), weight 198 lb (89.8 kg), SpO2 98 %. Body mass index is 32.95 kg/m.  General: Cooperative, alert, well developed, in no acute distress. HEENT: Conjunctivae and lids unremarkable. Cardiovascular: Regular rhythm.  Lungs: Normal work of breathing. Neurologic: No focal deficits.   Lab Results  Component Value Date   CREATININE 0.65 07/08/2021   BUN 15 07/08/2021   NA 146 (H) 07/08/2021   K 4.4 07/08/2021   CL 107 (H) 07/08/2021   CO2 25 07/08/2021   Lab Results  Component Value Date   ALT 31 07/08/2021   AST 20 07/08/2021   ALKPHOS 89 07/08/2021   BILITOT 0.3 07/08/2021   Lab Results  Component Value Date   HGBA1C 5.9 (H) 07/08/2021   HGBA1C 6.4 02/11/2021   HGBA1C 6.2 (H) 11/06/2020   HGBA1C 6.4 05/31/2020   HGBA1C 6.5 12/22/2019   Lab Results  Component Value Date   INSULIN 13.2 07/08/2021   INSULIN 22.6 11/06/2020   INSULIN 19.9 06/27/2020   Lab Results  Component Value Date   TSH 1.530 06/27/2020   Lab Results  Component Value Date   CHOL 205 (H) 07/08/2021   HDL 45 07/08/2021   LDLCALC 145 (H) 07/08/2021   LDLDIRECT 144.2 09/21/2008   TRIG 84 07/08/2021   CHOLHDL 4 02/11/2021   Lab Results  Component Value Date   VD25OH 54.9 07/08/2021   VD25OH 95.8 11/06/2020   VD25OH 81.6 06/27/2020   Lab Results  Component Value Date   WBC 5.7 06/27/2020   HGB 13.0 06/27/2020   HCT 39.0 06/27/2020   MCV 87 06/27/2020   PLT 268 06/27/2020   Lab Results  Component Value Date   IRON 62 11/09/2008   FERRITIN 3.9 (L) 11/09/2008   Attestation Statements:   Reviewed by clinician on day of visit: allergies, medications, problem list, medical history, surgical  history, family history, social history, and previous encounter notes.   I, Trixie Dredge, am acting as transcriptionist for Dennard Nip, MD.  I have reviewed the above documentation for accuracy and completeness, and I agree with the above. -  Dennard Nip, MD

## 2021-07-11 ENCOUNTER — Encounter (HOSPITAL_BASED_OUTPATIENT_CLINIC_OR_DEPARTMENT_OTHER): Payer: Self-pay | Admitting: Internal Medicine

## 2021-07-21 DIAGNOSIS — M48062 Spinal stenosis, lumbar region with neurogenic claudication: Secondary | ICD-10-CM | POA: Diagnosis not present

## 2021-07-21 DIAGNOSIS — M5441 Lumbago with sciatica, right side: Secondary | ICD-10-CM | POA: Diagnosis not present

## 2021-07-21 DIAGNOSIS — M5442 Lumbago with sciatica, left side: Secondary | ICD-10-CM | POA: Diagnosis not present

## 2021-07-21 DIAGNOSIS — E1169 Type 2 diabetes mellitus with other specified complication: Secondary | ICD-10-CM | POA: Diagnosis not present

## 2021-08-01 ENCOUNTER — Ambulatory Visit: Payer: 59 | Admitting: Internal Medicine

## 2021-08-04 ENCOUNTER — Other Ambulatory Visit (INDEPENDENT_AMBULATORY_CARE_PROVIDER_SITE_OTHER): Payer: Self-pay | Admitting: Family Medicine

## 2021-08-04 DIAGNOSIS — E1169 Type 2 diabetes mellitus with other specified complication: Secondary | ICD-10-CM

## 2021-08-05 ENCOUNTER — Ambulatory Visit (INDEPENDENT_AMBULATORY_CARE_PROVIDER_SITE_OTHER): Payer: BC Managed Care – PPO | Admitting: Family Medicine

## 2021-08-05 ENCOUNTER — Encounter (INDEPENDENT_AMBULATORY_CARE_PROVIDER_SITE_OTHER): Payer: Self-pay | Admitting: Family Medicine

## 2021-08-05 ENCOUNTER — Other Ambulatory Visit: Payer: Self-pay

## 2021-08-05 ENCOUNTER — Ambulatory Visit (HOSPITAL_BASED_OUTPATIENT_CLINIC_OR_DEPARTMENT_OTHER): Payer: BC Managed Care – PPO | Attending: Internal Medicine | Admitting: Internal Medicine

## 2021-08-05 VITALS — Ht 65.0 in | Wt 198.6 lb

## 2021-08-05 VITALS — BP 109/72 | HR 73 | Temp 97.6°F | Ht 65.0 in | Wt 198.0 lb

## 2021-08-05 DIAGNOSIS — G4710 Hypersomnia, unspecified: Secondary | ICD-10-CM | POA: Insufficient documentation

## 2021-08-05 DIAGNOSIS — I10 Essential (primary) hypertension: Secondary | ICD-10-CM | POA: Insufficient documentation

## 2021-08-05 DIAGNOSIS — G8929 Other chronic pain: Secondary | ICD-10-CM | POA: Diagnosis not present

## 2021-08-05 DIAGNOSIS — E669 Obesity, unspecified: Secondary | ICD-10-CM | POA: Insufficient documentation

## 2021-08-05 DIAGNOSIS — Z6833 Body mass index (BMI) 33.0-33.9, adult: Secondary | ICD-10-CM | POA: Diagnosis not present

## 2021-08-05 DIAGNOSIS — R0683 Snoring: Secondary | ICD-10-CM | POA: Diagnosis not present

## 2021-08-05 DIAGNOSIS — Z6832 Body mass index (BMI) 32.0-32.9, adult: Secondary | ICD-10-CM

## 2021-08-05 DIAGNOSIS — Z7985 Long-term (current) use of injectable non-insulin antidiabetic drugs: Secondary | ICD-10-CM

## 2021-08-05 DIAGNOSIS — E119 Type 2 diabetes mellitus without complications: Secondary | ICD-10-CM | POA: Insufficient documentation

## 2021-08-05 DIAGNOSIS — I493 Ventricular premature depolarization: Secondary | ICD-10-CM | POA: Diagnosis not present

## 2021-08-05 DIAGNOSIS — E1169 Type 2 diabetes mellitus with other specified complication: Secondary | ICD-10-CM

## 2021-08-05 DIAGNOSIS — M5441 Lumbago with sciatica, right side: Secondary | ICD-10-CM | POA: Diagnosis not present

## 2021-08-05 DIAGNOSIS — G4733 Obstructive sleep apnea (adult) (pediatric): Secondary | ICD-10-CM | POA: Diagnosis not present

## 2021-08-05 DIAGNOSIS — M5442 Lumbago with sciatica, left side: Secondary | ICD-10-CM | POA: Diagnosis not present

## 2021-08-05 DIAGNOSIS — M48062 Spinal stenosis, lumbar region with neurogenic claudication: Secondary | ICD-10-CM | POA: Diagnosis not present

## 2021-08-05 MED ORDER — SEMAGLUTIDE (1 MG/DOSE) 4 MG/3ML ~~LOC~~ SOPN: 1.0000 mg | PEN_INJECTOR | SUBCUTANEOUS | 0 refills | Status: DC

## 2021-08-06 ENCOUNTER — Encounter (INDEPENDENT_AMBULATORY_CARE_PROVIDER_SITE_OTHER): Payer: Self-pay

## 2021-08-06 ENCOUNTER — Telehealth (INDEPENDENT_AMBULATORY_CARE_PROVIDER_SITE_OTHER): Payer: Self-pay | Admitting: Family Medicine

## 2021-08-06 NOTE — Telephone Encounter (Signed)
Prior authorization approved for Ozempic. Effective: 08/05/2021 through 08/04/2022. Patient sent approval message via mychart.  ?

## 2021-08-06 NOTE — Progress Notes (Signed)
? ? ? ?Chief Complaint:  ? ?OBESITY ?Patty Williams is here to discuss her progress with her obesity treatment plan along with follow-up of her obesity related diagnoses. Patty Williams is on the Category 2 Plan and states she is following her eating plan approximately 40% of the time. Patty Williams states she is doing 0 minutes 0 times per week. ? ?Today's visit was #: 16 ?Starting weight: 229 lbs ?Starting date: 06/27/2020 ?Today's weight: 198 lbs ?Today's date: 08/05/2021 ?Total lbs lost to date: 35 ?Total lbs lost since last in-office visit: 0 ? ?Interim History: Patty Williams has had extra challenges with her mother in-law visiting for the last 3 weeks. She is struggling to meet her protein goals at times but she is working on this. She is interested in protein rich recipes.  ? ?Subjective:  ? ?1. Type 2 diabetes mellitus with other specified complication, unspecified whether long term insulin use ([place]) ?Patty Williams is having some GI issues with Ozempic, she notes increased nausea much of the day.  ? ?2. Essential hypertension, benign ?Patty Williams's blood pressure is well controlled with her diet, exercise, and medications. She is at high risk of dehydrations with weight loss and nausea. ? ?Assessment/Plan:  ? ?1. Type 2 diabetes mellitus with other specified complication, unspecified whether long term insulin use ([place]) ?Patty Williams will continue Ozempic but she may skip 1 dose. We will refill Ozempic for 1 month. ? ?- Semaglutide, 1 MG/DOSE, 4 MG/3ML SOPN; Inject 1 mg as directed once a week.  Dispense: 3 mL; Refill: 0 ? ?2. Essential hypertension, benign ?Patty Williams is to increase hydration, and her goal is of 80+ oz of water per day. We wil recheck her blood pressure in 4 weeks. ? ?3. Obesity with current BMI of 32.9 ?Patty Williams is currently in the action stage of change. As such, her goal is to continue with weight loss efforts. She has agreed to keeping a food journal and adhering to recommended goals of 1200 calories and 75+ grams of protein daily.  ? ?High protein recipe  packet given to patient today. ? ?Behavioral modification strategies: increasing lean protein intake and increasing water intake. ? ?Patty Williams has agreed to follow-up with our clinic in 4 weeks. She was informed of the importance of frequent follow-up visits to maximize her success with intensive lifestyle modifications for her multiple health conditions.  ? ?Objective:  ? ?Blood pressure 109/72, pulse 73, temperature 97.6 ?F (36.4 ?C), height '5\' 5"'$  (1.651 m), weight 198 lb (89.8 kg), SpO2 98 %. ?Body mass index is 32.95 kg/m?. ? ?General: Cooperative, alert, well developed, in no acute distress. ?HEENT: Conjunctivae and lids unremarkable. ?Cardiovascular: Regular rhythm.  ?Lungs: Normal work of breathing. ?Neurologic: No focal deficits.  ? ?Lab Results  ?Component Value Date  ? CREATININE 0.65 07/08/2021  ? BUN 15 07/08/2021  ? NA 146 (H) 07/08/2021  ? K 4.4 07/08/2021  ? CL 107 (H) 07/08/2021  ? CO2 25 07/08/2021  ? ?Lab Results  ?Component Value Date  ? ALT 31 07/08/2021  ? AST 20 07/08/2021  ? ALKPHOS 89 07/08/2021  ? BILITOT 0.3 07/08/2021  ? ?Lab Results  ?Component Value Date  ? HGBA1C 5.9 (H) 07/08/2021  ? HGBA1C 6.4 02/11/2021  ? HGBA1C 6.2 (H) 11/06/2020  ? HGBA1C 6.4 05/31/2020  ? HGBA1C 6.5 12/22/2019  ? ?Lab Results  ?Component Value Date  ? INSULIN 13.2 07/08/2021  ? INSULIN 22.6 11/06/2020  ? INSULIN 19.9 06/27/2020  ? ?Lab Results  ?Component Value Date  ? TSH 1.530 06/27/2020  ? ?  Lab Results  ?Component Value Date  ? CHOL 205 (H) 07/08/2021  ? HDL 45 07/08/2021  ? LDLCALC 145 (H) 07/08/2021  ? LDLDIRECT 144.2 09/21/2008  ? TRIG 84 07/08/2021  ? CHOLHDL 4 02/11/2021  ? ?Lab Results  ?Component Value Date  ? VD25OH 54.9 07/08/2021  ? VD25OH 95.8 11/06/2020  ? VD25OH 81.6 06/27/2020  ? ?Lab Results  ?Component Value Date  ? WBC 5.7 06/27/2020  ? HGB 13.0 06/27/2020  ? HCT 39.0 06/27/2020  ? MCV 87 06/27/2020  ? PLT 268 06/27/2020  ? ?Lab Results  ?Component Value Date  ? IRON 62 11/09/2008  ? FERRITIN 3.9  (L) 11/09/2008  ? ?Attestation Statements:  ? ?Reviewed by clinician on day of visit: allergies, medications, problem list, medical history, surgical history, family history, social history, and previous encounter notes. ? ?Time spent on visit including pre-visit chart review and post-visit care and charting was 40 minutes.  ? ? ?I, Trixie Dredge, am acting as transcriptionist for Dennard Nip, MD. ? ?I have reviewed the above documentation for accuracy and completeness, and I agree with the above. -  Dennard Nip, MD ? ? ?

## 2021-08-13 DIAGNOSIS — G4733 Obstructive sleep apnea (adult) (pediatric): Secondary | ICD-10-CM

## 2021-08-13 NOTE — Procedures (Signed)
? ? ?Patient Name: Patty Williams, Patty Williams ?Study Date: 08/05/2021 ?Gender: Female ?D.O.B: 11/17/1960 ?Age (years): 3 ?Referring Provider: Baird Lyons MD, ABSM ?Height (inches): 65 ?Interpreting Physician: Baird Lyons MD, ABSM ?Weight (lbs): 199 ?RPSGT: Carolin Coy ?BMI: 33 ?MRN: [id number] ?Neck Size: 14.50 ? ?CLINICAL INFORMATION ?Sleep Study Type: Split Night CPAP ?Indication for sleep study: Diabetes, Excessive Daytime Sleepiness, Hypertension, Obesity, OSA, Snoring ?[name] Score: 7 ? ?SLEEP STUDY TECHNIQUE ?As per the AASM Manual for the Scoring of Sleep and Associated Events v2.3 (April 2016) with a hypopnea requiring 4% desaturations. ? ?The channels recorded and monitored were frontal, central and occipital EEG, electrooculogram (EOG), submentalis EMG (chin), nasal and oral airflow, thoracic and abdominal wall motion, anterior tibialis EMG, snore microphone, electrocardiogram, and pulse oximetry. Continuous positive airway pressure (CPAP) was initiated when the patient met split night criteria and was titrated according to treat sleep-disordered breathing. ? ?MEDICATIONS ?Medications self-administered by patient taken the night of the study : none reported ? ?RESPIRATORY PARAMETERS ?Diagnostic ? ?Total AHI (/hr): 41.3 RDI (/hr): 81.8 OA Index (/hr): 0.8 CA Index (/hr): 0.0 ?REM AHI (/hr): N/A NREM AHI (/hr): 41.3 Supine AHI (/hr): 51.0 Non-supine AHI (/hr): 35.1 ?Min O2 Sat (%): 85.0 Mean O2 (%): 92.0 Time below 88% (min): 4.6  ? ?Titration ? ?Optimal Pressure (cm): 16 AHI at Optimal Pressure (/hr): 0 Min O2 at Optimal Pressure (%): 92.0 ?Supine % at Optimal (%): 0 Sleep % at Optimal (%): 99  ? ?SLEEP ARCHITECTURE ?The recording time for the entire night was 417 minutes. ? ?During a baseline period of 187.3 minutes, the patient slept for 143.7 minutes in REM and nonREM, yielding a sleep efficiency of 76.7%%. Sleep onset after lights out was 14.1 minutes with a REM latency of N/A minutes. The  patient spent 39.0%% of the night in stage N1 sleep, 61.0%% in stage N2 sleep, 0.0%% in stage N3 and 0% in REM. ? ?During the titration period of 227.8 minutes, the patient slept for 223.0 minutes in REM and nonREM, yielding a sleep efficiency of 97.9%%. Sleep onset after CPAP initiation was 3.0 minutes with a REM latency of 49.5 minutes. The patient spent 4.3%% of the night in stage N1 sleep, 66.1%% in stage N2 sleep, 0.0%% in stage N3 and 29.6% in REM. ? ?CARDIAC DATA ?The 2 lead EKG demonstrated sinus rhythm. The mean heart rate was 100.0 beats per minute. Other EKG findings include: PVCs. ? ?LEG MOVEMENT DATA ?The total Periodic Limb Movements of Sleep (PLMS) were 0. The PLMS index was 0.0 . ? ?IMPRESSIONS ?- Severe obstructive sleep apnea occurred during the diagnostic portion of the study (AHI = 41.3/hour). An optimal PAP pressure was selected for this patient ( 16 cm of water) ?- Mild oxygen desaturation was noted during the diagnostic portion of the study (Min O2 = 85.0%). Minimum O2 saturation on CPAP16 was 92%. ?- The patient snored with soft snoring volume during the diagnostic portion of the study. ?- EKG findings include PVCs. ?- Clinically significant periodic limb movements did not occur during sleep. ?- Bruxism noted. Patient wears bruxism guard. ? ?DIAGNOSIS ?- Obstructive Sleep Apnea (G47.33) ?- Bruxism ? ?RECOMMENDATIONS ?- Patient currently using autopap 4-15. CPAP 14 provided good control with AHI 0 and minimal snoring. She has noted aerophagia with higher pressures. Suggest continue autopap 4-14 or 4-15.  ?- Patient used a Medium size Resmed Full Face AirTouch mask and heated humidification. ?- Be careful with alcohol, sedatives and other CNS depressants that may worsen sleep apnea  and disrupt normal sleep architecture. ?- Sleep hygiene should be reviewed to assess factors that may improve sleep quality. ?- Weight management and regular exercise should be initiated or  continued. ? ?[Electronically signed] 08/13/2021 01:16 PM ? ?Baird Lyons MD, ABSM ?Diplomate, Tax adviser of Sleep Medicine ?NPI: [id number] ? ?  ? ? ? ? ? ? ? ? ? ? ? ? ? ? ? ? ? ? ? ? ? ?Patty Williams ?Diplomate, Tax adviser of Sleep Medicine ? ?ELECTRONICALLY SIGNED ON:  08/13/2021, 1:08 PM ?[place] ?PH: [phone]   FX: [phone] ?ACCREDITED BY THE AMERICAN ACADEMY OF SLEEP MEDICINE ?

## 2021-08-15 ENCOUNTER — Encounter (INDEPENDENT_AMBULATORY_CARE_PROVIDER_SITE_OTHER): Payer: Self-pay | Admitting: Family Medicine

## 2021-08-16 NOTE — Progress Notes (Signed)
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 ? 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 Has hot air balloon "Moonshine".  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 ?She has 2 CPAP machines, 1 of which is a travel machine for which there is no download capability.  She uses 1 over the other every night with definite benefit and no concerns.  Does think she would like lower pressure setting and we are going to try reducing to 8-14.  She is sleeping well with no daytime fatigue. ?Asthma control has been excellent with no significant exacerbation and no cardiac problems. ? 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 Has hot air balloon "Moonshine". ?NPSG 08/05/21- AHI 41.3/ hr, desaturation to 85%, CPAP to 16, body weight 199 lbs ?-Symbicort 80, ?CPAP auto 8-14/ [facility]                AirSense 10 AutoSet   ?Download- compliance  ?Body weight today-   ?Covid vax-4 Moderna                                   ?Flu vax-had ?We reviewed recent sleep study.  She had uncomfortable aerophagia with pressure around 18 before but is willing to try range 8-16.  She would like to change DME company as discussed and we will switch her to [employer]. ?Pending septoplasty. ?She was given another copy of her FAA forms. ? ?ROS-see HPI    + = positive ?Constitutional:    weight loss, night sweats, fevers, chills, fatigue, lassitude. ?HEENT:    headaches, difficulty swallowing, tooth/dental problems, sore throat,  ?     sneezing, itching, ear ache, nasal congestion, post nasal drip, snoring ?CV:    chest pain, orthopnea, PND, swelling in lower extremities, anasarca,                                 ?  dizziness, palpitations ?Resp:   +shortness of breath with exertion or at rest.   ?             productive cough,   non-productive cough, coughing up of blood.   ?           change in color of mucus.  wheezing.   ?Skin:    rash or lesions. ?GI:    +heartburn, indigestion, abdominal pain, nausea, vomiting, diarrhea,  ?  change in bowel habits, loss of appetite ?GU: dysuria, change in color of urine, no urgency or frequency,  flank pain. ?MS:   joint pain, stiffness, decreased range of motion, back pain. ?Neuro-     nothing unusual ?Psych:  change in mood or affect.  depression or anxiety.   memory loss. ? ?OBJ- Physical Exam ?General- Alert, Oriented, Affect-appropriate, Distress- none acute, + overweight ?Skin- rash-none, lesions- none, excoriation- none ?Lymphadenopathy- none ?Head- atraumatic ?           Eyes- Gross vision intact, PERRLA, conjunctivae and secretions clear ?           Ears- Hearing, canals-normal ?           Nose- Clear, no-Septal dev, mucus, polyps, erosion, perforation  ?           Throat- Mallampati II-III , mucosa clear , drainage- none, tonsils- atrophic ?Neck- flexible , trachea midline, no stridor , thyroid nl, carotid no bruit ?Chest - symmetrical excursion , unlabored ?          Heart/CV- RRR , no murmur , no gallop  , no rub, nl s1 s2 ?                          - JVD- none , edema- none, stasis changes- none, varices- none ?          Lung- clear to P&A- no crackles, wheeze- none, cough- none , dullness-none, rub- none ?          Chest wall-  ?Abd-  ?Br/ Gen/ Rectal- Not done, not indicated ?Extrem- cyanosis-  none, clubbing, none, atrophy- none, strength- nl ?Neuro- grossly intact to observation ? ? ? ? ? ?

## 2021-08-18 ENCOUNTER — Encounter
Admission: RE | Admit: 2021-08-18 | Discharge: 2021-08-18 | Disposition: A | Discharge status: Home / Self Care | Payer: BC Managed Care – PPO | Source: Ambulatory Visit | Attending: Unknown Physician Specialty | Admitting: Unknown Physician Specialty

## 2021-08-18 ENCOUNTER — Encounter: Payer: Self-pay | Admitting: Internal Medicine

## 2021-08-18 ENCOUNTER — Other Ambulatory Visit
Admission: RE | Admit: 2021-08-18 | Discharge: 2021-08-18 | Disposition: A | Discharge status: Home / Self Care | Payer: BC Managed Care – PPO | Source: Ambulatory Visit | Attending: Unknown Physician Specialty | Admitting: Unknown Physician Specialty

## 2021-08-18 ENCOUNTER — Other Ambulatory Visit: Payer: Self-pay

## 2021-08-18 ENCOUNTER — Ambulatory Visit (INDEPENDENT_AMBULATORY_CARE_PROVIDER_SITE_OTHER): Payer: BC Managed Care – PPO | Admitting: Internal Medicine

## 2021-08-18 VITALS — BP 120/70 | HR 94 | Temp 98.6°F | Ht 65.0 in | Wt 204.2 lb

## 2021-08-18 DIAGNOSIS — H10023 Other mucopurulent conjunctivitis, bilateral: Secondary | ICD-10-CM | POA: Diagnosis not present

## 2021-08-18 DIAGNOSIS — Z01810 Encounter for preprocedural cardiovascular examination: Secondary | ICD-10-CM | POA: Diagnosis not present

## 2021-08-18 DIAGNOSIS — G4733 Obstructive sleep apnea (adult) (pediatric): Secondary | ICD-10-CM

## 2021-08-18 DIAGNOSIS — I10 Essential (primary) hypertension: Secondary | ICD-10-CM | POA: Diagnosis not present

## 2021-08-18 NOTE — Patient Instructions (Signed)
We will work with you to complete those [place] papers. ? ?Order- DME change to [facility]   please change current CPAP machines to auto 8-16, mask of choice, humidifier, supplies, AirView/ card ? ?Please let us know how we can help. ?

## 2021-08-18 NOTE — Patient Instructions (Addendum)
Your procedure is scheduled on: 09/02/21 Report to [facility]. To find out your arrival time please call [phone] between 1PM - 3PM on  09/01/21.  Remember: Instructions that are not followed completely may result in serious medical risk, up to and including death, or upon the discretion of your surgeon and anesthesiologist your surgery may need to be rescheduled.     _X__ 1. Do not eat food or drink any liquids after midnight the night before your procedure.                 No gum chewing or hard candies.   __X__2.  On the morning of surgery brush your teeth with toothpaste and water, you                 may rinse your mouth with mouthwash if you wish.  Do not swallow any              toothpaste of mouthwash.     _X__ 3.  No Alcohol for 24 hours before or after surgery.   _X__ 4.  Do Not Smoke or use e-cigarettes For 24 Hours Prior to Your Surgery.                 Do not use any chewable tobacco products for at least 6 hours prior to                 surgery.  ____  5.  Bring all medications with you on the day of surgery if instructed.   __X__  6.  Notify your doctor if there is any change in your medical condition      (cold, fever, infections).     Do not wear jewelry, make-up, hairpins, clips or nail polish. Do not wear lotions, powders, or perfumes.  Do not shave body hair 48 hours prior to surgery. Men may shave face and neck. Do not bring valuables to the hospital.    [facility] is not responsible for any belongings or valuables.  Contacts, dentures/partials or body piercings may not be worn into surgery. Bring a case for your contacts, glasses or hearing aids, a denture cup will be supplied. Leave your suitcase in the car. After surgery it may be brought to your room. For patients admitted to the hospital, discharge time is determined by your treatment team.   Patients discharged the day of surgery will not be  allowed to drive home.   Please read over the following fact sheets that you were given:   MRSA Information  __X__ Take these medicines the morning of surgery with A SIP OF WATER:    1. pantoprazole (PROTONIX) 40 MG tablet  2.   3.   4.  5.  6.  ____ Fleet Enema (as directed)   ____ Use CHG Soap/SAGE wipes as directed  __X__ Use inhalers on the day of surgery  ____ Stop metformin/Janumet/Farxiga 2 days prior to surgery    ____ Take 1/2 of usual insulin dose the night before surgery. No insulin the morning          of surgery.   ____ Stop Blood Thinners Coumadin/Plavix/Xarelto/Pleta/Pradaxa/Eliquis/Effient/Aspirin  on   Or contact your Surgeon, Cardiologist or Medical Doctor regarding  ability to stop your blood thinners  __X__ Stop Anti-inflammatories 7 days before surgery such as Advil, Ibuprofen, Motrin,  BC or Goodies Powder, Naprosyn, Naproxen, Aleve, Aspirin   May take Tylenol as  needed  __X__ Stop all herbals and supplements, fish oil or vitamins for 7 days until after surgery.    ____ Bring C-Pap to the hospital.

## 2021-08-18 NOTE — Telephone Encounter (Signed)
Please see message and advise.  Thank you. ° °

## 2021-08-18 NOTE — Telephone Encounter (Signed)
Last OV with Dr. Beasley 

## 2021-08-19 ENCOUNTER — Encounter: Payer: Self-pay | Admitting: Internal Medicine

## 2021-08-19 DIAGNOSIS — M48062 Spinal stenosis, lumbar region with neurogenic claudication: Secondary | ICD-10-CM | POA: Diagnosis not present

## 2021-08-19 DIAGNOSIS — M5441 Lumbago with sciatica, right side: Secondary | ICD-10-CM | POA: Diagnosis not present

## 2021-08-19 NOTE — Assessment & Plan Note (Signed)
She has benefited from CPAP.  Asking to change DME company. ?Plan-change DME to [facility], change pressure range to 8-16 for her main CPAP machine and her travel machine. ?She has her FAA forms. ?Okay to try simethicone if needed for gas cramps. ?

## 2021-08-25 ENCOUNTER — Other Ambulatory Visit: Payer: 59

## 2021-08-25 ENCOUNTER — Encounter: Payer: Self-pay | Admitting: Internal Medicine

## 2021-08-25 NOTE — Telephone Encounter (Signed)
I have sent order to [facility].  Will route back thru triage so they can make pt aware thru MyChart & close message. ?

## 2021-08-25 NOTE — Pre-Procedure Instructions (Signed)
Patient called to let us know she recently was diagnosed with conjunctivitis and is currently on Tobramycin eye drops for about 7 days. Appears to be resolving. Will send note to Dr Tami Ribas.

## 2021-08-25 NOTE — Telephone Encounter (Signed)
Patient would like order for pressure settings sent to [facility] instead of [place] as it is to far of a drive. Message sent to [facility]  ?

## 2021-08-29 DIAGNOSIS — M545 Low back pain, unspecified: Secondary | ICD-10-CM | POA: Diagnosis not present

## 2021-08-29 DIAGNOSIS — H1013 Acute atopic conjunctivitis, bilateral: Secondary | ICD-10-CM | POA: Diagnosis not present

## 2021-09-01 ENCOUNTER — Ambulatory Visit (INDEPENDENT_AMBULATORY_CARE_PROVIDER_SITE_OTHER): Payer: BC Managed Care – PPO | Admitting: Family Medicine

## 2021-09-01 VITALS — HR 57 | Temp 97.5°F | Ht 65.0 in | Wt 199.0 lb

## 2021-09-01 DIAGNOSIS — E1169 Type 2 diabetes mellitus with other specified complication: Secondary | ICD-10-CM | POA: Diagnosis not present

## 2021-09-01 DIAGNOSIS — Z7985 Long-term (current) use of injectable non-insulin antidiabetic drugs: Secondary | ICD-10-CM | POA: Diagnosis not present

## 2021-09-01 DIAGNOSIS — Z6833 Body mass index (BMI) 33.0-33.9, adult: Secondary | ICD-10-CM | POA: Diagnosis not present

## 2021-09-01 DIAGNOSIS — E669 Obesity, unspecified: Secondary | ICD-10-CM

## 2021-09-01 MED ORDER — SODIUM CHLORIDE 0.9 % IV SOLN: INTRAVENOUS | Status: DC

## 2021-09-01 MED ORDER — APREPITANT 40 MG PO CAPS
40.0000 mg | ORAL_CAPSULE | Freq: Once | ORAL | Status: AC
Start: 2021-09-01 — End: 2021-09-02

## 2021-09-01 MED ORDER — SEMAGLUTIDE (1 MG/DOSE) 4 MG/3ML ~~LOC~~ SOPN: 1.0000 mg | PEN_INJECTOR | SUBCUTANEOUS | 0 refills | Status: DC

## 2021-09-01 MED ORDER — CHLORHEXIDINE GLUCONATE 0.12 % MT SOLN: 15.0000 mL | Freq: Once | OROMUCOSAL | Status: AC

## 2021-09-01 MED ORDER — ORAL CARE MOUTH RINSE: 15.0000 mL | Freq: Once | OROMUCOSAL | Status: AC

## 2021-09-01 NOTE — Progress Notes (Signed)
? ? ? ?Chief Complaint:  ? ?OBESITY ?Brityn is here to discuss her progress with her obesity treatment plan along with follow-up of her obesity related diagnoses. Nayda is on keeping a food journal and adhering to recommended goals of 1200 calories and 75+ grams of protein daily and states she is following her eating plan approximately 35% of the time. Tyrese states she is doing 0 minutes 0 times per week. ? ?Today's visit was #: 39 ?Starting weight: 229 lbs ?Starting date: 06/27/2020 ?Today's weight: 199 lbs ?Today's date: 09/01/2021 ?Total lbs lost to date: 22 ?Total lbs lost since last in-office visit: 0 ? ?Interim History: Trulee has had some stress and did more traveling. She is working on meeting her protein. She is deviating with increased work stress as well.  ? ?Subjective:  ? ?1. Type 2 diabetes mellitus with other specified complication, unspecified whether long term insulin use ([place]) ?Mazelle is doing well with diet and exercise overall, but she has had some challenges recently. She has no problems with Ozempic and no hypoglycemia noted. ? ?Assessment/Plan:  ? ?1. Type 2 diabetes mellitus with other specified complication, unspecified whether long term insulin use ([place]) ?Malay will continue Ozempic, and we will refill for 1 month. ? ?- Semaglutide, 1 MG/DOSE, 4 MG/3ML SOPN; Inject 1 mg as directed once a week.  Dispense: 3 mL; Refill: 0 ? ?2. Obesity with current BMI of 33.1 ?Lorain is currently in the action stage of change. As such, her goal is to continue with weight loss efforts. She has agreed to keeping a food journal and adhering to recommended goals of 1200 calories and 75+ grams of protein daily.  ? ?Behavioral modification strategies: increasing lean protein intake. ? ?Grizelda has agreed to follow-up with our clinic in 4 weeks. She was informed of the importance of frequent follow-up visits to maximize her success with intensive lifestyle modifications for her multiple health conditions.  ? ?Objective:   ? ?Pulse (!) 57, temperature (!) 97.5 ?F (36.4 ?C), temperature source Oral, height '5\' 5"'$  (1.651 m), weight 199 lb (90.3 kg), last menstrual period 10/08/2018, SpO2 100 %. ?Body mass index is 33.12 kg/m?. ? ?General: Cooperative, alert, well developed, in no acute distress. ?HEENT: Conjunctivae and lids unremarkable. ?Cardiovascular: Regular rhythm.  ?Lungs: Normal work of breathing. ?Neurologic: No focal deficits.  ? ?Lab Results  ?Component Value Date  ? CREATININE 0.65 07/08/2021  ? BUN 15 07/08/2021  ? NA 146 (H) 07/08/2021  ? K 4.4 07/08/2021  ? CL 107 (H) 07/08/2021  ? CO2 25 07/08/2021  ? ?Lab Results  ?Component Value Date  ? ALT 31 07/08/2021  ? AST 20 07/08/2021  ? ALKPHOS 89 07/08/2021  ? BILITOT 0.3 07/08/2021  ? ?Lab Results  ?Component Value Date  ? HGBA1C 5.9 (H) 07/08/2021  ? HGBA1C 6.4 02/11/2021  ? HGBA1C 6.2 (H) 11/06/2020  ? HGBA1C 6.4 05/31/2020  ? HGBA1C 6.5 12/22/2019  ? ?Lab Results  ?Component Value Date  ? INSULIN 13.2 07/08/2021  ? INSULIN 22.6 11/06/2020  ? INSULIN 19.9 06/27/2020  ? ?Lab Results  ?Component Value Date  ? TSH 1.530 06/27/2020  ? ?Lab Results  ?Component Value Date  ? CHOL 205 (H) 07/08/2021  ? HDL 45 07/08/2021  ? LDLCALC 145 (H) 07/08/2021  ? LDLDIRECT 144.2 09/21/2008  ? TRIG 84 07/08/2021  ? CHOLHDL 4 02/11/2021  ? ?Lab Results  ?Component Value Date  ? VD25OH 54.9 07/08/2021  ? VD25OH 95.8 11/06/2020  ? VD25OH 81.6 06/27/2020  ? ?  Lab Results  ?Component Value Date  ? WBC 5.7 06/27/2020  ? HGB 13.0 06/27/2020  ? HCT 39.0 06/27/2020  ? MCV 87 06/27/2020  ? PLT 268 06/27/2020  ? ?Lab Results  ?Component Value Date  ? IRON 62 11/09/2008  ? FERRITIN 3.9 (L) 11/09/2008  ? ?Attestation Statements:  ? ?Reviewed by clinician on day of visit: allergies, medications, problem list, medical history, surgical history, family history, social history, and previous encounter notes. ? ? ?I, Trixie Dredge, am acting as transcriptionist for Dennard Nip, MD. ? ?I have reviewed the  above documentation for accuracy and completeness, and I agree with the above. -  Dennard Nip, MD ? ? ?

## 2021-09-02 ENCOUNTER — Other Ambulatory Visit: Payer: Self-pay

## 2021-09-02 ENCOUNTER — Encounter
Admission: RE | Disposition: A | Discharge status: Home / Self Care | Payer: Self-pay | Source: Home / Self Care | Attending: Unknown Physician Specialty

## 2021-09-02 ENCOUNTER — Encounter: Payer: Self-pay | Admitting: Unknown Physician Specialty

## 2021-09-02 ENCOUNTER — Ambulatory Visit: Payer: BC Managed Care – PPO | Admitting: Anesthesiology

## 2021-09-02 ENCOUNTER — Ambulatory Visit
Admission: RE | Admit: 2021-09-02 | Discharge: 2021-09-02 | Disposition: A | Discharge status: Home / Self Care | Payer: BC Managed Care – PPO | Attending: Unknown Physician Specialty | Admitting: Unknown Physician Specialty

## 2021-09-02 DIAGNOSIS — I10 Essential (primary) hypertension: Secondary | ICD-10-CM | POA: Diagnosis not present

## 2021-09-02 DIAGNOSIS — E669 Obesity, unspecified: Secondary | ICD-10-CM | POA: Diagnosis not present

## 2021-09-02 DIAGNOSIS — J45909 Unspecified asthma, uncomplicated: Secondary | ICD-10-CM | POA: Insufficient documentation

## 2021-09-02 DIAGNOSIS — J342 Deviated nasal septum: Secondary | ICD-10-CM | POA: Insufficient documentation

## 2021-09-02 DIAGNOSIS — E785 Hyperlipidemia, unspecified: Secondary | ICD-10-CM | POA: Diagnosis not present

## 2021-09-02 DIAGNOSIS — J3489 Other specified disorders of nose and nasal sinuses: Secondary | ICD-10-CM | POA: Diagnosis not present

## 2021-09-02 DIAGNOSIS — K219 Gastro-esophageal reflux disease without esophagitis: Secondary | ICD-10-CM | POA: Insufficient documentation

## 2021-09-02 DIAGNOSIS — J343 Hypertrophy of nasal turbinates: Secondary | ICD-10-CM | POA: Diagnosis not present

## 2021-09-02 DIAGNOSIS — G4733 Obstructive sleep apnea (adult) (pediatric): Secondary | ICD-10-CM | POA: Insufficient documentation

## 2021-09-02 DIAGNOSIS — Z6833 Body mass index (BMI) 33.0-33.9, adult: Secondary | ICD-10-CM | POA: Diagnosis not present

## 2021-09-02 DIAGNOSIS — E119 Type 2 diabetes mellitus without complications: Secondary | ICD-10-CM | POA: Diagnosis not present

## 2021-09-02 DIAGNOSIS — Z7985 Long-term (current) use of injectable non-insulin antidiabetic drugs: Secondary | ICD-10-CM | POA: Diagnosis not present

## 2021-09-02 HISTORY — PX: NASAL SEPTOPLASTY W/ TURBINOPLASTY: SHX2070

## 2021-09-02 LAB — GLUCOSE, CAPILLARY
Glucose-Capillary: 108 mg/dL — ABNORMAL HIGH (ref 70–99)
Glucose-Capillary: 87 mg/dL (ref 70–99)

## 2021-09-02 SURGERY — SEPTOPLASTY, NOSE, WITH NASAL TURBINATE REDUCTION
Anesthesia: General | Laterality: Bilateral

## 2021-09-02 MED ORDER — PROPOFOL 10 MG/ML IV BOLUS
INTRAVENOUS | Status: AC
  Filled 2021-09-02: qty 40

## 2021-09-02 MED ORDER — OXYCODONE HCL 5 MG/5ML PO SOLN: 5.0000 mg | Freq: Once | ORAL | Status: DC | PRN

## 2021-09-02 MED ORDER — PROPOFOL 10 MG/ML IV BOLUS
INTRAVENOUS | Status: AC
  Filled 2021-09-02: qty 20

## 2021-09-02 MED ORDER — DEXAMETHASONE SODIUM PHOSPHATE 10 MG/ML IJ SOLN
INTRAMUSCULAR | Status: DC | PRN
Start: 2021-09-02 — End: 2021-09-02
  Administered 2021-09-02: 10 mg via INTRAVENOUS

## 2021-09-02 MED ORDER — GLYCOPYRROLATE 0.2 MG/ML IJ SOLN
INTRAMUSCULAR | Status: AC
  Filled 2021-09-02: qty 1

## 2021-09-02 MED ORDER — ROCURONIUM BROMIDE 10 MG/ML (PF) SYRINGE
PREFILLED_SYRINGE | INTRAVENOUS | Status: AC
  Filled 2021-09-02: qty 10

## 2021-09-02 MED ORDER — PHENYLEPHRINE 80 MCG/ML (10ML) SYRINGE FOR IV PUSH (FOR BLOOD PRESSURE SUPPORT)
PREFILLED_SYRINGE | INTRAVENOUS | Status: DC | PRN
  Administered 2021-09-02 (×3): 80 ug via INTRAVENOUS

## 2021-09-02 MED ORDER — PHENYLEPHRINE HCL 10 % OP SOLN
OPHTHALMIC | Status: DC | PRN
  Administered 2021-09-02: 10 mL via TOPICAL

## 2021-09-02 MED ORDER — 0.9 % SODIUM CHLORIDE (POUR BTL) OPTIME
TOPICAL | Status: DC | PRN
  Administered 2021-09-02: 500 mL

## 2021-09-02 MED ORDER — PROPOFOL 500 MG/50ML IV EMUL
INTRAVENOUS | Status: DC | PRN
  Administered 2021-09-02: 150 ug/kg/min via INTRAVENOUS

## 2021-09-02 MED ORDER — LIDOCAINE HCL (PF) 4 % IJ SOLN
INTRAMUSCULAR | Status: AC
  Filled 2021-09-02: qty 5

## 2021-09-02 MED ORDER — ACETAMINOPHEN 10 MG/ML IV SOLN
1000.0000 mg | Freq: Once | INTRAVENOUS | Status: DC | PRN
  Administered 2021-09-02: 1000 mg via INTRAVENOUS

## 2021-09-02 MED ORDER — ACETAMINOPHEN 10 MG/ML IV SOLN
INTRAVENOUS | Status: AC
  Filled 2021-09-02: qty 100

## 2021-09-02 MED ORDER — SUGAMMADEX SODIUM 200 MG/2ML IV SOLN
INTRAVENOUS | Status: DC | PRN
Start: 2021-09-02 — End: 2021-09-02
  Administered 2021-09-02: 200 mg via INTRAVENOUS

## 2021-09-02 MED ORDER — LIDOCAINE-EPINEPHRINE 1 %-1:100000 IJ SOLN
INTRAMUSCULAR | Status: DC | PRN
  Administered 2021-09-02: 7 mL

## 2021-09-02 MED ORDER — ONDANSETRON HCL 4 MG/2ML IJ SOLN: 4.0000 mg | Freq: Once | INTRAMUSCULAR | Status: DC | PRN

## 2021-09-02 MED ORDER — GLYCOPYRROLATE 0.2 MG/ML IJ SOLN
INTRAMUSCULAR | Status: DC | PRN
  Administered 2021-09-02: .2 mg via INTRAVENOUS

## 2021-09-02 MED ORDER — BACITRACIN ZINC 500 UNIT/GM EX OINT
TOPICAL_OINTMENT | CUTANEOUS | Status: AC
  Filled 2021-09-02: qty 28.35

## 2021-09-02 MED ORDER — HYDROCODONE-ACETAMINOPHEN 5-300 MG PO TABS: 1.0000 | ORAL_TABLET | ORAL | 0 refills | Status: DC | PRN

## 2021-09-02 MED ORDER — LIDOCAINE-EPINEPHRINE 1 %-1:100000 IJ SOLN
INTRAMUSCULAR | Status: AC
  Filled 2021-09-02: qty 1

## 2021-09-02 MED ORDER — ONDANSETRON HCL 4 MG/2ML IJ SOLN
INTRAMUSCULAR | Status: DC | PRN
Start: 2021-09-02 — End: 2021-09-02
  Administered 2021-09-02: 4 mg via INTRAVENOUS

## 2021-09-02 MED ORDER — OXYMETAZOLINE HCL 0.05 % NA SOLN
NASAL | Status: AC
  Filled 2021-09-02: qty 30

## 2021-09-02 MED ORDER — FENTANYL CITRATE (PF) 100 MCG/2ML IJ SOLN
INTRAMUSCULAR | Status: AC
Start: 2021-09-02 — End: ?
  Filled 2021-09-02: qty 2

## 2021-09-02 MED ORDER — OXYCODONE HCL 5 MG PO TABS: 5.0000 mg | ORAL_TABLET | Freq: Once | ORAL | Status: DC | PRN

## 2021-09-02 MED ORDER — PHENYLEPHRINE 80 MCG/ML (10ML) SYRINGE FOR IV PUSH (FOR BLOOD PRESSURE SUPPORT)
PREFILLED_SYRINGE | INTRAVENOUS | Status: AC
  Filled 2021-09-02: qty 10

## 2021-09-02 MED ORDER — BACITRACIN-NEOMYCIN-POLYMYXIN OINTMENT TUBE
TOPICAL_OINTMENT | CUTANEOUS | Status: DC | PRN
  Administered 2021-09-02: 1 via TOPICAL

## 2021-09-02 MED ORDER — APREPITANT 40 MG PO CAPS
ORAL_CAPSULE | ORAL | Status: AC
  Administered 2021-09-02: 40 mg via ORAL
  Filled 2021-09-02: qty 1

## 2021-09-02 MED ORDER — CHLORHEXIDINE GLUCONATE 0.12 % MT SOLN
OROMUCOSAL | Status: AC
  Administered 2021-09-02: 15 mL via OROMUCOSAL
  Filled 2021-09-02: qty 15

## 2021-09-02 MED ORDER — FENTANYL CITRATE (PF) 100 MCG/2ML IJ SOLN
INTRAMUSCULAR | Status: DC | PRN
  Administered 2021-09-02 (×2): 50 ug via INTRAVENOUS

## 2021-09-02 MED ORDER — PROPOFOL 500 MG/50ML IV EMUL
INTRAVENOUS | Status: AC
  Filled 2021-09-02: qty 50

## 2021-09-02 MED ORDER — SULFAMETHOXAZOLE-TRIMETHOPRIM 800-160 MG PO TABS: 1.0000 | ORAL_TABLET | Freq: Two times a day (BID) | ORAL | 0 refills | Status: DC

## 2021-09-02 MED ORDER — FENTANYL CITRATE (PF) 100 MCG/2ML IJ SOLN: 25.0000 ug | INTRAMUSCULAR | Status: DC | PRN

## 2021-09-02 MED ORDER — MIDAZOLAM HCL 2 MG/2ML IJ SOLN
INTRAMUSCULAR | Status: AC
  Filled 2021-09-02: qty 2

## 2021-09-02 MED ORDER — LIDOCAINE HCL (CARDIAC) PF 100 MG/5ML IV SOSY
PREFILLED_SYRINGE | INTRAVENOUS | Status: DC | PRN
  Administered 2021-09-02: 100 mg via INTRAVENOUS

## 2021-09-02 MED ORDER — OXYMETAZOLINE HCL 0.05 % NA SOLN: 6.0000 | NASAL | Status: DC

## 2021-09-02 MED ORDER — ROCURONIUM BROMIDE 100 MG/10ML IV SOLN
INTRAVENOUS | Status: DC | PRN
  Administered 2021-09-02: 50 mg via INTRAVENOUS

## 2021-09-02 MED ORDER — PROPOFOL 10 MG/ML IV BOLUS
INTRAVENOUS | Status: DC | PRN
  Administered 2021-09-02: 150 mg via INTRAVENOUS

## 2021-09-02 SURGICAL SUPPLY — 25 items
BLADE SURG 15 STRL LF DISP TIS (BLADE) ×2 IMPLANT
BLADE SURG 15 STRL SS (BLADE) ×2
COAG SUCT 10F 3.5MM HAND CTRL (MISCELLANEOUS) ×3 IMPLANT
DRESSING NASL FOAM PST OP SINU (MISCELLANEOUS) ×4 IMPLANT
DRSG NASAL FOAM POST OP SINU (MISCELLANEOUS) ×4
ELECT REM PT RETURN 9FT ADLT (ELECTROSURGICAL) ×2
ELECTRODE REM PT RTRN 9FT ADLT (ELECTROSURGICAL) ×2 IMPLANT
GAUZE 4X4 16PLY ~~LOC~~+RFID DBL (SPONGE) ×3 IMPLANT
GLOVE SURG ENC MOIS LTX SZ7.5 (GLOVE) ×6 IMPLANT
GOWN STRL REUS W/ TWL LRG LVL3 (GOWN DISPOSABLE) ×4 IMPLANT
GOWN STRL REUS W/TWL LRG LVL3 (GOWN DISPOSABLE) ×4
LABEL OR SOLS (LABEL) ×3 IMPLANT
MANIFOLD NEPTUNE II (INSTRUMENTS) ×3 IMPLANT
NS IRRIG 500ML POUR BTL (IV SOLUTION) ×3 IMPLANT
PACK HEAD/NECK (MISCELLANEOUS) ×3 IMPLANT
SPLINT NASAL REUTER .5MM (MISCELLANEOUS) IMPLANT
SPLINT NASAL REUTER .5MM BIVLV (MISCELLANEOUS) ×1 IMPLANT
SPONGE NEURO XRAY DETECT 1X3 (DISPOSABLE) ×3 IMPLANT
SUT CHROMIC 3-0 (SUTURE) ×2
SUT CHROMIC 3-0 KS 27XMFL CR (SUTURE) ×1
SUT ETHILON 3-0 KS 30 BLK (SUTURE) ×3 IMPLANT
SUT PLAIN GUT 4-0 (SUTURE) ×3 IMPLANT
SUTURE CHRMC 3-0 KS 27XMFL CR (SUTURE) ×2 IMPLANT
WATER STERILE IRR 1000ML POUR (IV SOLUTION) ×2 IMPLANT
WATER STERILE IRR 500ML POUR (IV SOLUTION) ×3 IMPLANT

## 2021-09-02 NOTE — Discharge Instructions (Signed)
AMBULATORY SURGERY  ?DISCHARGE INSTRUCTIONS ? ? ?The drugs that you were given will stay in your system until tomorrow so for the next 24 hours you should not: ? ?Drive an automobile ?Make any legal decisions ?Drink any alcoholic beverage ? ? ?You may resume regular meals tomorrow.  Today it is better to start with liquids and gradually work up to solid foods. ? ?You may eat anything you prefer, but it is better to start with liquids, then soup and crackers, and gradually work up to solid foods. ? ? ?Please notify your doctor immediately if you have any unusual bleeding, trouble breathing, redness and pain at the surgery site, drainage, fever, or pain not relieved by medication. ? ? ? ?Additional Instructions: ? ? ? ?Please contact your physician with any problems or Same Day Surgery at [phone], Monday through Friday 6 am to 4 pm, or [place] [facility] [place] [facility] number at [phone].  ?

## 2021-09-02 NOTE — Anesthesia Procedure Notes (Signed)
Procedure Name: Intubation ?Date/Time: 09/02/2021 8:56 AM ?Performed by: Esaw Grandchild, CRNA ?Pre-anesthesia Checklist: Patient identified, Emergency Drugs available, Suction available and Patient being monitored ?Patient Re-evaluated:Patient Re-evaluated prior to induction ?Oxygen Delivery Method: Circle system utilized ?Preoxygenation: Pre-oxygenation with 100% oxygen ?Induction Type: IV induction ?Ventilation: Mask ventilation without difficulty ?Laryngoscope Size: McGraph and 3 ?Grade View: Grade I ?Tube type: Oral Dwyane Luo ?Tube size: 6.5 mm ?Number of attempts: 1 ?Airway Equipment and Method: Oral airway and Bite block ?Placement Confirmation: ETT inserted through vocal cords under direct vision, positive ETCO2 and breath sounds checked- equal and bilateral ?Secured at: 21 cm ?Tube secured with: Tape ?Dental Injury: Teeth and Oropharynx as per pre-operative assessment  ? ? ? ? ?

## 2021-09-02 NOTE — Anesthesia Preprocedure Evaluation (Signed)
Anesthesia Evaluation  ?Patient identified by MRN, date of birth, ID band ?Patient awake ? ? ? ?Reviewed: ?Allergy & Precautions, NPO status , Patient's Chart, lab work & pertinent test results ? ?History of Anesthesia Complications ?(+) PONV and history of anesthetic complications ? ?Airway ?Mallampati: I ? ?TM Distance: >3 FB ?Neck ROM: Full ? ? ? Dental ? ?(+) Teeth Intact, Dental Advisory Given, Caps, Chipped,  ?  ?Pulmonary ?asthma , sleep apnea and Continuous Positive Airway Pressure Ventilation , neg COPD, Patient abstained from smoking.Not current smoker,  ?Well controlled asthma ?  ?breath sounds clear to auscultation ? ? ? ? ? ? Cardiovascular ?Exercise Tolerance: Good ?METShypertension, Pt. on medications ?(-) CAD and (-) Past MI (-) dysrhythmias  ?Rhythm:Regular Rate:Normal ? ? ?  ?Neuro/Psych ?Anxiety  ?New diagnosis of elevated intraocular pressures, says they have not called it acute angle closure glaucoma however. ?negative neurological ROS ? negative psych ROS  ? GI/Hepatic ?Neg liver ROS, GERD  Medicated and Controlled,  ?Endo/Other  ?diabetes, Well Controlled, Type 2 ? Renal/GU ?negative Renal ROS  ? ?  ?Musculoskeletal ?negative musculoskeletal ROS ?(+)  ? Abdominal ?Normal abdominal exam  (+)   ?Peds ? Hematology ? 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    Comment:  mild ?2014: Diverticulosis ?    Comment:  Mild ?No date: Dry mouth ?No date: Elevated blood pressure reading without diagnosis of  ?hypertension ?No date: Endometriosis ?    Comment:  Severe ?No date: Fibroids ?No date: GERD (gastroesophageal reflux disease) ?No date: History of ovarian cyst ?No date: HLD (hyperlipidemia) ?03/24/2019: Hx of adenomatous polyp of colon ?No date:  Hyperglycemia ?No date: Hyperlipidemia ?No date: Hypertension ?No date: Iron deficiency anemia ?No date: Joint pain ?No date: Menorrhagia ?No date: Obesity ?No date: Obesity ?No date: OSA on CPAP ?No date: Plantar fasciitis ?    Comment:  with orthotics-much difficulty adjusting these ?No date: PMB (postmenopausal bleeding) ?No date: Pneumonia ?    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 Reproductive/Obstetrics ? ?  ? ? ? ? ? ? ? ? ? ? ? ? ? ?  ?  ? ? ? ? ? ? ? ? ?Lab Results  ?Component Value Date  ? WBC 5.7 06/27/2020  ? HGB 13.0 06/27/2020  ? HCT 39.0 06/27/2020  ? MCV 87 06/27/2020  ? PLT 268 06/27/2020  ? ?Lab Results  ?Component Value Date  ? CREATININE 0.65 07/08/2021  ? BUN 15 07/08/2021  ? NA 146 (H) 07/08/2021  ? K 4.4 07/08/2021  ? CL 107 (H) 07/08/2021  ? CO2 25 07/08/2021  ? ?No results found for: INR, PROTIME ? ?COVID-19 Labs ? ?No results for input(s): DDIMER, FERRITIN, LDH, CRP in the last 72 hours. ? ?Lab Results  ?Component Value Date  ? SARSCOV2NAA NOT DETECTED 09/19/2018  ? ? ? ?Anesthesia Physical ? ?Anesthesia Plan ? ?ASA: 2 ? ?Anesthesia Plan: General  ? ?Post-op Pain Management: Ofirmev IV (intra-op)* and Toradol IV (intra-op)*  ? ?Induction: Intravenous ? ?PONV Risk Score and Plan: 4 or greater and Ondansetron, Dexamethasone, Midazolam, Aprepitant, Propofol infusion and TIVA ? ?Airway Management Planned:  Oral ETT ? ?Additional Equipment: None ? ?Intra-op Plan:  ? ?Post-operative Plan: Extubation in OR ? ?Informed Consent: I have reviewed the patients History and Physical, chart, labs and discussed the procedure including the risks, benefits and alternatives for the proposed anesthesia with the patient or authorized representative who has indicated his/her understanding and acceptance.  ? ? ? ?Dental advisory  given ? ?Plan Discussed with: CRNA ? ?Anesthesia Plan Comments: (Discussed risks of anesthesia with patient, including PONV, sore throat, lip/dental/eye damage. Rare risks discussed as well, such as cardiorespiratory and neurological sequelae, and allergic reactions. Discussed the role of CRNA in patient's perioperative care. Patient understands. ?Will avoid scopolamine due to hx of increased intraocular pressure.)  ? ? ? ? ? ? ?Anesthesia Quick Evaluation ? ?

## 2021-09-02 NOTE — Op Note (Signed)
PREOPERATIVE DIAGNOSIS:  Chronic nasal obstruction. ? ?POSTOPERATIVE DIAGNOSIS:  Chronic nasal obstruction. ? ?SURGEON:  Roena Malady, M.D. ? ?NAME OF PROCEDURE:  ?Nasal septoplasty. ?Submucous resection of inferior turbinates. ? ?OPERATIVE FINDINGS:  Severe nasal septal deformity, hypertrophy of the inferior turbinates.  ? ?DESCRIPTION OF THE PROCEDURE:  Patty Williams was identified in the holding area and taken to the operating room and placed in the supine position.  After general endotracheal anesthesia was induced, the table was turned 45 degrees and the patient was placed in a semi-Fowler position.  The nose was then topically anesthetized with Lidocaine, cotton pledgets were placed within each nostril. After approximately 5 minutes, this was removed at which time a local anesthetic of 1% Lidocaine 1:100,000 units of Epinephrine was used to inject the inferior turbinates in the nasal septum. A total of 12 ml was used. Examination of the nose showed a severe left nasal septal deformity and tremendous hypertrophied inferior turbinate.  Beginning on the right hand side a hemitransfixion incision was then created on the leading edge of the septum on the right.  A subperichondrial plane was elevated posteriorly on the left and taken back to the perpendicular plate of the ethmoid where subperiosteal plane was elevated posteriorly on the left. A large septal spur was identified on the left hand side impacting on the inferior turbinate.  An inferior rim of cartilage was removed anteriorly with care taken to leave an anterior strut to prevent nasal collapse. With this strut removed the perpendicular plate of the ethmoid was separated from the quadrangular cartilage. The large septal spur was removed.  The septum was then replaced in the midline. Reinspection through each nostril showed excellent reduction of the septal deformity. A left inferior fenestration was then created to allow hematoma drainage.  With  the septoplasty completed, beginning on the left-hand side, a 15 blade was used to incise along the inferior edge of the inferior turbinate. A superior laterally based flap was then elevated. The underlying conchal bone of mucosa was excised using Knight scissors. The flap was then laid back over the turbinate stump and cauterized using suction cautery. In a similar fashion the submucous resection was performed on the right.  With the submucous resection completed bilaterally and no active bleeding, the hemitransfixion incision was then closed using two interrupted 3-0 chromic sutures.  Plastic nasal septal splints were placed within each nostril and affixed to the septum using a 3-0 nylon suture. Stammberger was then used beneath each inferior turbinate for hemostasis.   ? ?The patient tolerated the procedure well, was returned to anesthesia, extubated in the operating room, and taken to the recovery room in stable condition.   ? ?CULTURES:  None. ? ?SPECIMENS:  None. ? ?ESTIMATED BLOOD LOSS:  25 cc. ? ?Roena Malady ? ?09/02/2021 ? ?9:47 AM ? ?  ?

## 2021-09-02 NOTE — H&P (Signed)
The patient's history has been reviewed, patient examined, no change in status, stable for surgery.  Questions were answered to the patients satisfaction.  

## 2021-09-02 NOTE — Anesthesia Postprocedure Evaluation (Signed)
Anesthesia Post Note ? ?Patient: Patty Williams ? ?Procedure(s) Performed: NASAL SEPTOPLASTY WITH SUBMUCOSAL RESECTION OF TURBINATES (Bilateral) ? ?Patient location during evaluation: PACU ?Anesthesia Type: General ?Level of consciousness: awake and alert ?Pain management: pain level controlled ?Vital Signs Assessment: post-procedure vital signs reviewed and stable ?Respiratory status: spontaneous breathing, nonlabored ventilation, respiratory function stable and patient connected to nasal cannula oxygen ?Cardiovascular status: blood pressure returned to baseline and stable ?Postop Assessment: no apparent nausea or vomiting ?Anesthetic complications: no ? ? ?No notable events documented. ? ? ?Last Vitals:  ?Vitals:  ? 09/02/21 1045 09/02/21 1103  ?BP: (!) 154/85 (!) 156/89  ?Pulse: 61 64  ?Resp: 10 16  ?Temp: (!) 36.1 ?C (!) 36.3 ?C  ?SpO2: 96% 98%  ?  ?Last Pain:  ?Vitals:  ? 09/02/21 1103  ?TempSrc: Temporal  ?PainSc: 2   ? ? ?  ?  ?  ?  ?  ?  ? ?Arita Miss ? ? ? ? ?

## 2021-09-02 NOTE — Transfer of Care (Signed)
Immediate Anesthesia Transfer of Care Note ? ?Patient: Patty Williams ? ?Procedure(s) Performed: NASAL SEPTOPLASTY WITH SUBMUCOSAL RESECTION OF TURBINATES (Bilateral) ? ?Patient Location: PACU ? ?Anesthesia Type:General ? ?Level of Consciousness: awake, alert  and oriented ? ?Airway & Oxygen Therapy: Patient Spontanous Breathing and Patient connected to face mask oxygen ? ?Post-op Assessment: Report given to RN, Post -op Vital signs reviewed and stable and Patient moving all extremities ? ?Post vital signs: Reviewed and stable ? ?Last Vitals:  ?Vitals Value Taken Time  ?BP 141/92 09/02/21 0955  ?Temp 36.3 ?C 09/02/21 0955  ?Pulse 71 09/02/21 0958  ?Resp 12 09/02/21 0958  ?SpO2 100 % 09/02/21 0958  ? ? ?Last Pain:  ?Vitals:  ? 09/02/21 0731  ?TempSrc: Oral  ?PainSc: 0-No pain  ?   ? ?  ? ?Complications: No notable events documented. ?

## 2021-09-03 ENCOUNTER — Telehealth: Payer: Self-pay

## 2021-09-03 ENCOUNTER — Other Ambulatory Visit: Payer: Self-pay | Admitting: Internal Medicine

## 2021-09-03 NOTE — Telephone Encounter (Signed)
I have started a prior authorization for her Pantoprazole '40mg'$ , one twice a day. Dx: GERD K21.9 thru [facility]. Will await outcome. ?

## 2021-09-05 NOTE — Telephone Encounter (Signed)
Her pantoprazole has been approved thru 09/02/2022. I have informed [facility] at fax # [phone]. ?

## 2021-09-08 DIAGNOSIS — M5442 Lumbago with sciatica, left side: Secondary | ICD-10-CM | POA: Diagnosis not present

## 2021-09-08 DIAGNOSIS — M48062 Spinal stenosis, lumbar region with neurogenic claudication: Secondary | ICD-10-CM | POA: Diagnosis not present

## 2021-09-08 DIAGNOSIS — M5441 Lumbago with sciatica, right side: Secondary | ICD-10-CM | POA: Diagnosis not present

## 2021-09-08 DIAGNOSIS — G8929 Other chronic pain: Secondary | ICD-10-CM | POA: Diagnosis not present

## 2021-09-08 LAB — HM PAP SMEAR

## 2021-09-16 DIAGNOSIS — J3489 Other specified disorders of nose and nasal sinuses: Secondary | ICD-10-CM | POA: Diagnosis not present

## 2021-09-19 ENCOUNTER — Telehealth: Payer: Self-pay | Admitting: Internal Medicine

## 2021-09-23 ENCOUNTER — Encounter: Payer: Self-pay | Admitting: Internal Medicine

## 2021-09-23 DIAGNOSIS — H40033 Anatomical narrow angle, bilateral: Secondary | ICD-10-CM | POA: Diagnosis not present

## 2021-09-23 DIAGNOSIS — G4733 Obstructive sleep apnea (adult) (pediatric): Secondary | ICD-10-CM

## 2021-09-23 NOTE — Telephone Encounter (Signed)
Order- [facility]- please change auto range to 5-15 ?

## 2021-09-23 NOTE — Telephone Encounter (Signed)
Dr. Annamaria Boots, please advise on pt's email. She is requesting a decrease in her CPAP pressure. Per pt's last OV (08/18/2021) she is at auto 8-16. ?

## 2021-09-30 ENCOUNTER — Encounter (INDEPENDENT_AMBULATORY_CARE_PROVIDER_SITE_OTHER): Payer: Self-pay | Admitting: Family Medicine

## 2021-09-30 ENCOUNTER — Encounter (INDEPENDENT_AMBULATORY_CARE_PROVIDER_SITE_OTHER): Payer: Self-pay

## 2021-09-30 ENCOUNTER — Ambulatory Visit (INDEPENDENT_AMBULATORY_CARE_PROVIDER_SITE_OTHER): Payer: BC Managed Care – PPO | Admitting: Family Medicine

## 2021-09-30 ENCOUNTER — Telehealth (INDEPENDENT_AMBULATORY_CARE_PROVIDER_SITE_OTHER): Payer: BC Managed Care – PPO | Admitting: Family Medicine

## 2021-09-30 DIAGNOSIS — E669 Obesity, unspecified: Secondary | ICD-10-CM

## 2021-09-30 DIAGNOSIS — Z7985 Long-term (current) use of injectable non-insulin antidiabetic drugs: Secondary | ICD-10-CM

## 2021-09-30 DIAGNOSIS — J3489 Other specified disorders of nose and nasal sinuses: Secondary | ICD-10-CM | POA: Diagnosis not present

## 2021-09-30 DIAGNOSIS — E1169 Type 2 diabetes mellitus with other specified complication: Secondary | ICD-10-CM | POA: Diagnosis not present

## 2021-09-30 DIAGNOSIS — E785 Hyperlipidemia, unspecified: Secondary | ICD-10-CM

## 2021-09-30 DIAGNOSIS — Z6833 Body mass index (BMI) 33.0-33.9, adult: Secondary | ICD-10-CM | POA: Diagnosis not present

## 2021-09-30 DIAGNOSIS — Z6838 Body mass index (BMI) 38.0-38.9, adult: Secondary | ICD-10-CM

## 2021-09-30 MED ORDER — SEMAGLUTIDE (1 MG/DOSE) 4 MG/3ML ~~LOC~~ SOPN: 1.0000 mg | PEN_INJECTOR | SUBCUTANEOUS | 0 refills | Status: DC

## 2021-09-30 NOTE — Progress Notes (Addendum)
TeleHealth Visit:  This visit was completed with telemedicine (audio/video) technology. Patty Williams has verbally consented to this TeleHealth visit. The patient is located at home, the provider is located at home. The participants in this visit include the listed provider and patient. The visit was conducted today via MyChart video.  OBESITY Patty Williams is here to discuss her progress with her obesity treatment plan along with follow-up of her obesity related diagnoses.   Today's visit was # 18 Starting weight: 229 lbs Starting date: 06/27/2020 Weight at last in office visit: 199 lbs on 09/01/21 Total weight loss: 30 lbs at last in office visit on 09/01/21. Today's reported weight: No weight reported.  Nutrition Plan: keeping a food journal and adhering to recommended goals of 1200 calories and 75+ gms protein.  Hunger is well controlled.  Current exercise: started walking as tolerated. Has chronic back pain (history of ablation and steroid injection). Goes to gym once weekly and does upper body strength training.  Interim History: Patty Williams reports that her life is a bit chaotic and she does not see this changing anytime in the near future.  She is a Pharmacist, community and it is very challenging to drink water or eat lunch during the day.  Then she is overly hungry in the evening. She tends to travel quite a bit and finds it challenging to journal foods that she eats on the road-feels she has to guess at what to journal.  Assessment/Plan:  1. Type II Diabetes with unspecified complications, without long-term insulin use HgbA1c is at goal. Medication(s): Ozempic 1 mg weekly.  Tolerates well and appetite generally well controlled.  Lab Results  Component Value Date   HGBA1C 5.9 (H) 07/08/2021   HGBA1C 6.4 02/11/2021   HGBA1C 6.2 (H) 11/06/2020   Lab Results  Component Value Date   MICROALBUR 1.0 08/16/2009   LDLCALC 145 (H) 07/08/2021   CREATININE 0.65 07/08/2021   Plan: Refill Ozempic 1 mg  weekly  2. Hyperlipidemia associated with type 2 diabetes. LDL is not at goal. Medication(s): fish oil-does not take daily. Cardiovascular risk factors: diabetes mellitus, dyslipidemia, hypertension, obesity (BMI >= 30 kg/m2), and sedentary lifestyle  Lab Results  Component Value Date   CHOL 205 (H) 07/08/2021   HDL 45 07/08/2021   LDLCALC 145 (H) 07/08/2021   LDLDIRECT 144.2 09/21/2008   TRIG 84 07/08/2021   CHOLHDL 4 02/11/2021   Lab Results  Component Value Date   ALT 31 07/08/2021   AST 20 07/08/2021   ALKPHOS 89 07/08/2021   BILITOT 0.3 07/08/2021   The 10-year ASCVD risk score (Arnett DK, et al., 2019) is: 8.4%   Values used to calculate the score:     Age: 61 years     Sex: Female     Is Non-Hispanic African American: No     Diabetic: Yes     Tobacco smoker: No     Systolic Blood Pressure: 673 mmHg     Is BP treated: Yes     HDL Cholesterol: 45 mg/dL     Total Cholesterol: 205 mg/dL  Plan: Encouraged her to take fish oil daily.  Discussed that a statin is recommended for people with diabetes over 9 years of age. She is concerned about possible myalgias from statin. Check labs next visit.  3. Obesity: Current BMI 33.1 Patty Williams is currently in the action stage of change. As such, her goal is to continue with weight loss efforts.  She has agreed to following a lower carbohydrate, vegetable and lean  protein rich diet plan.  OR  keeping a food journal and adhering to recommended goals of 1200 calories and 75+ gms protein.   Exercise goals: as is.  Behavioral modification strategies: increasing lean protein intake, decreasing simple carbohydrates, increasing water intake, travel eating strategies, and keeping a strict food journal. Discussed giving low-carb try as this may be a simpler approach for her. Low-carb plan sent via MyChart.  Patty Williams has agreed to follow-up with our clinic in 4 weeks.   No orders of the defined types were placed in this  encounter.   Medications Discontinued During This Encounter  Medication Reason   sulfamethoxazole-trimethoprim (BACTRIM DS) 800-160 MG tablet Patient Preference   Semaglutide, 1 MG/DOSE, 4 MG/3ML SOPN Reorder     Meds ordered this encounter  Medications   Semaglutide, 1 MG/DOSE, 4 MG/3ML SOPN    Sig: Inject 1 mg as directed once a week.    Dispense:  3 mL    Refill:  0    Order Specific Question:   Supervising Provider    Answer:   Dennard Nip D [[id number]]      Objective:   VITALS: Per patient if applicable, see vitals. GENERAL: Alert and in no acute distress. CARDIOPULMONARY: No increased WOB. Speaking in clear sentences.  PSYCH: Pleasant and cooperative. Speech normal rate and rhythm. Affect is appropriate. Insight and judgement are appropriate. Attention is focused, linear, and appropriate.  NEURO: Oriented as arrived to appointment on time with no prompting.   Lab Results  Component Value Date   CREATININE 0.65 07/08/2021   BUN 15 07/08/2021   NA 146 (H) 07/08/2021   K 4.4 07/08/2021   CL 107 (H) 07/08/2021   CO2 25 07/08/2021   Lab Results  Component Value Date   ALT 31 07/08/2021   AST 20 07/08/2021   ALKPHOS 89 07/08/2021   BILITOT 0.3 07/08/2021   Lab Results  Component Value Date   HGBA1C 5.9 (H) 07/08/2021   HGBA1C 6.4 02/11/2021   HGBA1C 6.2 (H) 11/06/2020   HGBA1C 6.4 05/31/2020   HGBA1C 6.5 12/22/2019   Lab Results  Component Value Date   INSULIN 13.2 07/08/2021   INSULIN 22.6 11/06/2020   INSULIN 19.9 06/27/2020   Lab Results  Component Value Date   TSH 1.530 06/27/2020   Lab Results  Component Value Date   CHOL 205 (H) 07/08/2021   HDL 45 07/08/2021   LDLCALC 145 (H) 07/08/2021   LDLDIRECT 144.2 09/21/2008   TRIG 84 07/08/2021   CHOLHDL 4 02/11/2021   Lab Results  Component Value Date   WBC 5.7 06/27/2020   HGB 13.0 06/27/2020   HCT 39.0 06/27/2020   MCV 87 06/27/2020   PLT 268 06/27/2020   Lab Results  Component Value  Date   IRON 62 11/09/2008   FERRITIN 3.9 (L) 11/09/2008   Lab Results  Component Value Date   VD25OH 54.9 07/08/2021   VD25OH 95.8 11/06/2020   VD25OH 81.6 06/27/2020    Attestation Statements:   Reviewed by clinician on day of visit: allergies, medications, problem list, medical history, surgical history, family history, social history, and previous encounter notes.

## 2021-10-06 ENCOUNTER — Other Ambulatory Visit: Payer: Self-pay | Admitting: Internal Medicine

## 2021-10-23 NOTE — Telephone Encounter (Signed)
Patient was able to complete her sleep study on 08/05/21. Nothing further needed

## 2021-10-27 ENCOUNTER — Ambulatory Visit (INDEPENDENT_AMBULATORY_CARE_PROVIDER_SITE_OTHER): Payer: BC Managed Care – PPO | Admitting: Family Medicine

## 2021-10-27 DIAGNOSIS — J3489 Other specified disorders of nose and nasal sinuses: Secondary | ICD-10-CM | POA: Diagnosis not present

## 2021-10-30 ENCOUNTER — Other Ambulatory Visit: Payer: Self-pay | Admitting: Family Medicine

## 2021-10-31 ENCOUNTER — Other Ambulatory Visit: Payer: Self-pay | Admitting: Internal Medicine

## 2021-11-03 DIAGNOSIS — Z114 Encounter for screening for human immunodeficiency virus [HIV]: Secondary | ICD-10-CM | POA: Diagnosis not present

## 2021-11-03 DIAGNOSIS — Z01419 Encounter for gynecological examination (general) (routine) without abnormal findings: Secondary | ICD-10-CM | POA: Diagnosis not present

## 2021-11-03 DIAGNOSIS — Z1159 Encounter for screening for other viral diseases: Secondary | ICD-10-CM | POA: Diagnosis not present

## 2021-11-03 DIAGNOSIS — Z6833 Body mass index (BMI) 33.0-33.9, adult: Secondary | ICD-10-CM | POA: Diagnosis not present

## 2021-11-05 ENCOUNTER — Telehealth (INDEPENDENT_AMBULATORY_CARE_PROVIDER_SITE_OTHER): Payer: Self-pay

## 2021-11-05 ENCOUNTER — Other Ambulatory Visit (INDEPENDENT_AMBULATORY_CARE_PROVIDER_SITE_OTHER): Payer: Self-pay

## 2021-11-05 DIAGNOSIS — E1169 Type 2 diabetes mellitus with other specified complication: Secondary | ICD-10-CM

## 2021-11-05 NOTE — Telephone Encounter (Signed)
Needs Ozempic for Type 2 DM  LAST APPOINTMENT DATE: 09/30/21, 10/27/21 appt r/s NEXT APPOINTMENT DATE: 11/17/21   [facility] - [place], [place] - [place] [address] Phone: [phone] Fax: [phone]  Patient is requesting a refill of the following medications: No prescriptions requested or ordered in this encounter   Date last filled: 09/30/21 Previously prescribed by Dr Leafy Ro  Lab Results      Component                Value               Date                      HGBA1C                   5.9 (H)             07/08/2021                HGBA1C                   6.4                 02/11/2021                HGBA1C                   6.2 (H)             11/06/2020           Lab Results      Component                Value               Date                      MICROALBUR               1.0                 08/16/2009                LDLCALC                  145 (H)             07/08/2021                CREATININE               0.65                07/08/2021           Lab Results      Component                Value               Date                      VD25OH                   54.9                07/08/2021                VD25OH  95.8                11/06/2020                VD25OH                   81.6                06/27/2020            BP Readings from Last 3 Encounters: 09/02/21 : (!) 156/89 08/18/21 : 120/70 08/05/21 : 109/72 '

## 2021-11-05 NOTE — Telephone Encounter (Signed)
Patient is scheduled for July 10th With Dr. Leafy Ro. Patient states she will be out of her ozempic before her appt. Patient would like it sent to :  [place], [place] Phone:  [phone]  Fax:  [phone]

## 2021-11-06 ENCOUNTER — Other Ambulatory Visit (INDEPENDENT_AMBULATORY_CARE_PROVIDER_SITE_OTHER): Payer: Self-pay | Admitting: Family Medicine

## 2021-11-06 DIAGNOSIS — E1169 Type 2 diabetes mellitus with other specified complication: Secondary | ICD-10-CM

## 2021-11-06 NOTE — Telephone Encounter (Signed)
LAST APPOINTMENT DATE:09/30/21 NEXT APPOINTMENT DATE: 11/17/21   [facility] - [place], [place] - [address] [place] [address] Phone: [phone] Fax: [phone]  Patient is requesting a refill of the following medications: Pending Prescriptions:                       Disp   Refills   Semaglutide, 1 MG/DOSE, 4 MG/3ML SOPN      3 mL   0       Sig: Inject 1 mg as directed once a week.   Date last filled: 09/30/21 Previously prescribed by [facility]  Lab Results      Component                Value               Date                      HGBA1C                   5.9 (H)             07/08/2021                HGBA1C                   6.4                 02/11/2021                HGBA1C                   6.2 (H)             11/06/2020           Lab Results      Component                Value               Date                      MICROALBUR               1.0                 08/16/2009                LDLCALC                  145 (H)             07/08/2021                CREATININE               0.65                07/08/2021           Lab Results      Component                Value               Date                      VD25OH                   54.9  07/08/2021                VD25OH                   95.8                11/06/2020                VD25OH                   81.6                06/27/2020            BP Readings from Last 3 Encounters: 09/02/21 : (!) 156/89 08/18/21 : 120/70 08/05/21 : 109/72

## 2021-11-10 MED ORDER — SEMAGLUTIDE (1 MG/DOSE) 4 MG/3ML ~~LOC~~ SOPN: 1.0000 mg | PEN_INJECTOR | SUBCUTANEOUS | 0 refills | Status: DC

## 2021-11-12 ENCOUNTER — Other Ambulatory Visit: Payer: Self-pay | Admitting: Family Medicine

## 2021-11-13 NOTE — Progress Notes (Signed)
11/17/2021 Patty Williams [id number] 03/04/1961  Referring provider: Abner Greenspan, MD Primary GI doctor: Dr. Carlean Purl  ASSESSMENT AND PLAN:   Gastroesophageal reflux disease, unspecified whether esophagitis present she reports symptoms are currently well controlled, still has some nausea.  Lifestyle changes discussed, avoid NSAIDS Continue current medications but in the future if comes off of GLP1 and with weight loss, can try PPI every other day  AB bloating Has resolved with fixed CPAP If not improving can consider treating constipation versus methane producing SIBO  Type 2 diabetes mellitus with other specified complication, without long-term current use of insulin (HCC) On ozempic which could be contributing to symptoms Given gastroparesis diet to help  Chronic idiopathic constipation with history of chronic lower back pain, history of extensive endometriosis s/p open surgery in 1980's. -     AMB referral to rehabilitation - she also has some stress incontinence, lower back pain not responsive to PT/injections, constipation- will try pelvic floor PT, if this does not work she will pursue doing nerve stimulator.    Patient Care Team: Tower, Wynelle Fanny, MD as PCP - General  HISTORY OF PRESENT ILLNESS: 61 y.o. female with a past medical history of she diabetes, diverticulosis, hypertension, endometriosis, personal history of adenomatous polyps, OSA on CPAP, obesity and others listed below presents for evaluation of BRB in BM, RUQ pain. .  03/10/2019 colonoscopy and endoscopy with Dr. Carlean Purl Colonoscopy showed 1 polyp 8 mm diverticulosis otherwise normal Endoscopy for esophageal reflux symptoms despite therapy showed normal esophagus, normal stomach normal duodenum recommended weight loss and low carbohydrate diet, patient has been seeing weight loss clinic.  Patient would like to cut back on her pantoprazole.  Patient is on GLP-1 inhibitor, olmesartan, naproxen,  hydrocodone and turmeric. She is on ozempic, she is on naprosyn 1-2 x a week.   In June for 3-4 days after BM she had BRB which have stopped and this is better.  She has history of constipation. She is on colace daily.  Rare urinary incontinence with laughing. She has lower back pain, has had lots of PT/injections without help.  Worse with standing for a long time. Internested in pelvic floor PT.   She started on CPAP 2008, had increased the pressure.  She wears full face mask, she has changed the settings to soft and this has helped.  She would wake up with pains in her stomach, very bloated, when she woke up she would burp a lot.  No GERD, no nausea, vomiting.  Just had sleep study in March, new CPAP 3 days ago.  Her travel CPAP machine helped.  No bloating during the day.   Current Medications:   Current Outpatient Medications (Endocrine & Metabolic):    Semaglutide, 1 MG/DOSE, 4 MG/3ML SOPN, Inject 1 mg as directed once a week.  Current Outpatient Medications (Cardiovascular):    olmesartan (BENICAR) 40 MG tablet, Take 1 tablet (40 mg total) by mouth daily.  Current Outpatient Medications (Respiratory):    albuterol (VENTOLIN HFA) 108 (90 Base) MCG/ACT inhaler, Inhale 2 puffs into the lungs every 4 (four) hours as needed.   budesonide-formoterol (SYMBICORT) 80-4.5 MCG/ACT inhaler, USE 2 INHALATIONS TWICE A DAY  Current Outpatient Medications (Analgesics):    naproxen (NAPROSYN) 500 MG tablet, Take 1 tablet (500 mg total) by mouth 2 (two) times daily as needed for moderate pain. With a meal   Current Outpatient Medications (Other):    docusate sodium (COLACE) 100 MG capsule, Take 100 mg by  mouth daily as needed for mild constipation.   Multiple Vitamin (MULTIVITAMIN) tablet, Take 1 tablet by mouth daily.   Omega-3 Fatty Acids (FISH OIL) 1000 MG CAPS, Take 2,000 mg by mouth daily.   pantoprazole (PROTONIX) 40 MG tablet, Take 1 tablet (40 mg total) by mouth 2 (two) times daily  before a meal. Breakfast and supper   Turmeric (QC TUMERIC COMPLEX PO), Take 2,000 mg by mouth daily.  Medical History:  Past Medical History:  Diagnosis Date   Anxiety    Asthma    Back pain    Bilateral ovarian cysts    Bruxism    Constipation    Diabetes mellitus type II    mild   Diverticulosis 2014   Mild   Dry mouth    Elevated blood pressure reading without diagnosis of hypertension    Endometriosis    Severe   Fibroids    GERD (gastroesophageal reflux disease)    History of ovarian cyst    HLD (hyperlipidemia)    Hx of adenomatous polyp of colon 03/24/2019   Hyperglycemia    Hyperlipidemia    Hypertension    Iron deficiency anemia    Joint pain    Menorrhagia    Obesity    Obesity    OSA on CPAP    Plantar fasciitis    with orthotics-much difficulty adjusting these   PMB (postmenopausal bleeding)    Pneumonia    history of   PONV (postoperative nausea and vomiting)    Pre-diabetes    Rectovaginal fistula    Sleep apnea    Uterine adhesion    Uterine fibroid    Wears glasses    Allergies: No Known Allergies   Surgical History:  She  has a past surgical history that includes Hemorrhoid surgery; Uterine suspension; laparoscopy; Colonoscopy (01/2009); Upper gi endoscopy (01/2009); Dilatation & curettage/hysteroscopy with myosure (N/A, 09/22/2018); Wisdom tooth extraction; Tonsillectomy and adenoidectomy; uterine cyst exploration; Trigger finger release; uterine biopsy; and Nasal septoplasty w/ turbinoplasty (Bilateral, 09/02/2021). Family History:  Her family history includes Anxiety disorder in her mother; Cancer in her father; Diabetes in her mother; Heart attack in an other family member; Hyperlipidemia in her mother; Hypertension in her father and mother; Obesity in her father and mother; Parkinsonism in an other family member; Prostate cancer in her father; Sleep apnea in her mother; Thyroid disease in her mother. Social History:   reports that she has  never smoked. She has never used smokeless tobacco. She reports current alcohol use. She reports that she does not use drugs.  REVIEW OF SYSTEMS  : All other systems reviewed and negative except where noted in the History of Present Illness.   PHYSICAL EXAM: BP 132/74   Pulse 75   Ht '5\' 5"'$  (1.651 m)   Wt 203 lb (92.1 kg)   LMP 10/08/2018 (Exact Date)   BMI 33.78 kg/m  General:   Pleasant, well developed female in no acute distress Heart:   regular rate and rhythm Pulm:  Clear anteriorly; no wheezing Abdomen:   Soft, Non-distended AB, Active bowel sounds. No tenderness . , No organomegaly appreciated. Rectal: Not evaluated Extremities:  Without edema. Msk: Symmetrical without gross deformities. Peripheral pulses intact.  Neurologic:  Alert and  oriented x4;  No focal deficits.  Skin:   Dry and intact without significant lesions or rashes. Psychiatric:  Cooperative. Normal mood and affect.    Vladimir Crofts, PA-C 11:20 AM

## 2021-11-14 ENCOUNTER — Ambulatory Visit: Payer: BC Managed Care – PPO | Admitting: Gastroenterology

## 2021-11-17 ENCOUNTER — Ambulatory Visit (INDEPENDENT_AMBULATORY_CARE_PROVIDER_SITE_OTHER): Payer: BC Managed Care – PPO | Admitting: Family Medicine

## 2021-11-17 ENCOUNTER — Ambulatory Visit (INDEPENDENT_AMBULATORY_CARE_PROVIDER_SITE_OTHER): Payer: BC Managed Care – PPO | Admitting: Physician Assistant

## 2021-11-17 ENCOUNTER — Encounter: Payer: Self-pay | Admitting: Physician Assistant

## 2021-11-17 ENCOUNTER — Encounter (INDEPENDENT_AMBULATORY_CARE_PROVIDER_SITE_OTHER): Payer: Self-pay | Admitting: Family Medicine

## 2021-11-17 VITALS — BP 108/70 | HR 63 | Temp 98.4°F | Ht 65.0 in | Wt 197.0 lb

## 2021-11-17 VITALS — BP 132/74 | HR 75 | Ht 65.0 in | Wt 203.0 lb

## 2021-11-17 DIAGNOSIS — K5904 Chronic idiopathic constipation: Secondary | ICD-10-CM | POA: Diagnosis not present

## 2021-11-17 DIAGNOSIS — E785 Hyperlipidemia, unspecified: Secondary | ICD-10-CM | POA: Diagnosis not present

## 2021-11-17 DIAGNOSIS — E1169 Type 2 diabetes mellitus with other specified complication: Secondary | ICD-10-CM | POA: Diagnosis not present

## 2021-11-17 DIAGNOSIS — K219 Gastro-esophageal reflux disease without esophagitis: Secondary | ICD-10-CM | POA: Diagnosis not present

## 2021-11-17 DIAGNOSIS — I152 Hypertension secondary to endocrine disorders: Secondary | ICD-10-CM

## 2021-11-17 DIAGNOSIS — E559 Vitamin D deficiency, unspecified: Secondary | ICD-10-CM

## 2021-11-17 DIAGNOSIS — E1159 Type 2 diabetes mellitus with other circulatory complications: Secondary | ICD-10-CM

## 2021-11-17 DIAGNOSIS — Z7985 Long-term (current) use of injectable non-insulin antidiabetic drugs: Secondary | ICD-10-CM

## 2021-11-17 DIAGNOSIS — E1165 Type 2 diabetes mellitus with hyperglycemia: Secondary | ICD-10-CM | POA: Diagnosis not present

## 2021-11-17 DIAGNOSIS — G8929 Other chronic pain: Secondary | ICD-10-CM

## 2021-11-17 DIAGNOSIS — M545 Low back pain, unspecified: Secondary | ICD-10-CM

## 2021-11-17 DIAGNOSIS — E669 Obesity, unspecified: Secondary | ICD-10-CM

## 2021-11-17 DIAGNOSIS — Z6832 Body mass index (BMI) 32.0-32.9, adult: Secondary | ICD-10-CM

## 2021-11-17 MED ORDER — SEMAGLUTIDE (1 MG/DOSE) 4 MG/3ML ~~LOC~~ SOPN: 1.0000 mg | PEN_INJECTOR | SUBCUTANEOUS | 0 refills | Status: DC

## 2021-11-17 NOTE — Patient Instructions (Addendum)
Gastroparesis Please do small frequent meals like 4-6 meals a day.  Eat and drink liquids at separate times.  Avoid high fiber foods, cook your vegetables, avoid high fat food.  Suggest spreading protein throughout the day (greek yogurt, glucerna, soft meat, milk, eggs) Choose soft foods that you can mash with a fork When you are more symptomatic, change to pureed foods foods and liquids.  Consider reading "Living well with Gastroparesis" by Lambert Keto Gastroparesis is a condition in which food takes longer than normal to empty from the stomach. This condition is also known as delayed gastric emptying. It is usually a long-term (chronic) condition. There is no cure, but there are treatments and things that you can do at home to help relieve symptoms. Treating the underlying condition that causes gastroparesis can also help relieve symptoms  Can try every other day pantoprazole  Please take this medication 30 minutes to 1 hour before meals- this makes it more effective.  Avoid spicy and acidic foods Avoid fatty foods Limit your intake of coffee, tea, alcohol, and carbonated drinks Work to maintain a healthy weight Keep the head of the bed elevated at least 3 inches with blocks or a wedge pillow if you are having any nighttime symptoms Stay upright for 2 hours after eating Avoid meals and snacks three to four hours before bedtime  Here some information about pelvic floor dysfunction. We could also refer to pelvic floor physical therapy.   Pelvic Floor Dysfunction, Female Pelvic floor dysfunction (PFD) is a condition that results when the group of muscles and connective tissues that support the organs in the pelvis (pelvic floor muscles) do not work well. These muscles and their connections form a sling that supports the colon and bladder. In women, they also support the uterus. PFD causes pelvic floor muscles to be too weak, too tight, or both. In PFD, muscle movements are not  coordinated. This may cause bowel or bladder problems. It may also cause pain. What are the causes? This condition may be caused by an injury to the pelvic area or by a weakening of pelvic muscles. This often results from pregnancy and childbirth or other types of strain. In many cases, the exact cause is not known. What increases the risk? The following factors may make you more likely to develop this condition: Having chronic bladder tissue inflammation (interstitial cystitis). Being an older person. Being overweight. History of radiation treatment for cancer in the pelvic region. Previous pelvic surgery, such as removal of the uterus (hysterectomy). What are the signs or symptoms? Symptoms of this condition vary and may include: Bladder symptoms, such as: Trouble starting urination and emptying the bladder. Frequent urinary tract infections. Leaking urine when coughing, laughing, or exercising (stress incontinence). Having to pass urine urgently or frequently. Pain when passing urine. Bowel symptoms, such as: Constipation. Urgent or frequent bowel movements. Incomplete bowel movements. Painful bowel movements. Leaking stool or gas. Unexplained genital or rectal pain. Genital or rectal muscle spasms. Low back pain. Other symptoms may include: A heavy, full, or aching feeling in the vagina. A bulge that protrudes into the vagina. Pain during or after sex. How is this diagnosed? This condition may be diagnosed based on: Your symptoms and medical history. A physical exam. During the exam, your health care provider may check your pelvic muscles for tightness, spasm, pain, or weakness. This may include a rectal exam and a pelvic exam. In some cases, you may have diagnostic tests, such as: Electrical muscle function tests.  Urine flow testing. X-ray tests of bowel function. Ultrasound of the pelvic organs. How is this treated? Treatment for this condition depends on the symptoms.  Treatment options include: Physical therapy. This may include Kegel exercises to help relax or strengthen the pelvic floor muscles. Biofeedback. This type of therapy provides feedback on how tight your pelvic floor muscles are so that you can learn to control them. Internal or external massage therapy. A treatment that involves electrical stimulation of the pelvic floor muscles to help control pain (transcutaneous electrical nerve stimulation, or TENS). Sound wave therapy (ultrasound) to reduce muscle spasms. Medicines, such as: Muscle relaxants. Bladder control medicines. Surgery to reconstruct or support pelvic floor muscles may be an option if other treatments do not help. Follow these instructions at home: Activity Do your usual activities as told by your health care provider. Ask your health care provider if you should modify any activities. Do pelvic floor strengthening or relaxing exercises at home as told by your physical therapist. Lifestyle Maintain a healthy weight. Eat foods that are high in fiber, such as beans, whole grains, and fresh fruits and vegetables. Limit foods that are high in fat and processed sugars, such as fried or sweet foods. Manage stress with relaxation techniques such as yoga or meditation. General instructions If you have problems with leakage: Use absorbable pads or wear padded underwear. Wash frequently with mild soap. Keep your genital and anal area as clean and dry as possible. Ask your health care provider if you should try a barrier cream to prevent skin irritation. Take warm baths to relieve pelvic muscle tension or spasms. Take over-the-counter and prescription medicines only as told by your health care provider. Keep all follow-up visits. How is this prevented? The cause of PFD is not always known, but there are a few things you can do to reduce the risk of developing this condition, including: Staying at a healthy weight. Getting regular  exercise. Managing stress. Contact a health care provider if: Your symptoms are not improving with home care. You have signs or symptoms of PFD that get worse at home. You develop new signs or symptoms. You have signs of a urinary tract infection, such as: Fever. Chills. Increased urinary frequency. A burning feeling when urinating. You have not had a bowel movement in 3 days (constipation). Summary Pelvic floor dysfunction results when the muscles and connective tissues in your pelvic floor do not work well. These muscles and their connections form a sling that supports your colon and bladder. In women, they also support the uterus. PFD may be caused by an injury to the pelvic area or by a weakening of pelvic muscles. PFD causes pelvic floor muscles to be too weak, too tight, or a combination of both. Symptoms may vary from person to person. In most cases, PFD can be treated with physical therapies and medicines. Surgery may be an option if other treatments do not help. This information is not intended to replace advice given to you by your health care provider. Make sure you discuss any questions you have with your health care provider. Document Revised: 09/04/2020 Document Reviewed: 09/04/2020 Elsevier Patient Education  [place].

## 2021-11-18 NOTE — Progress Notes (Signed)
Chief Complaint:   OBESITY Patty Williams is here to discuss her progress with her obesity treatment plan along with follow-up of her obesity related diagnoses. Patty Williams is on keeping a food journal and adhering to recommended goals of 1200 calories and 75+ grams of protein and states she is following her eating plan approximately 45% of the time. Patty Williams states she is doing physical therapy 15 minutes 3 times per week.  Today's visit was #: 36 Starting weight: 229 lbs Starting date: 06/27/2020 Today's weight: 197 lbs Today's date: 11/17/2021 Total lbs lost to date: 32 lbs Total lbs lost since last in-office visit: 2  Interim History: Patty Williams has minimal control in her life but tries to be in control of food choices and intake. She journals in am until noon. Afternoon and evening she tends to struggle with journaling. She has significant reflux and does not like to eat after 7:30 pm.  Subjective:   1. Hypertension associated with diabetes ([place]) Lexis's blood pressure is very well controlled today. Denies chest pain, chest pressure and headache.  2. Type 2 diabetes mellitus with hyperglycemia, without long-term current use of insulin ([place]) Patty Williams is currently on Ozempic 1 mg. Denies any GI side effects.  3. Hyperlipidemia associated with type 2 diabetes mellitus ([place]) Patty Williams is not on any medication. Her last labs showing LDL of 145, HDL of 45, and Trigly 85.  4. Vitamin D deficiency Patty Williams is not currently taking prescription Vit D.  Assessment/Plan:   1. Hypertension associated with diabetes ([place]) We will obtain labs today.  - Comprehensive metabolic panel  2. Type 2 diabetes mellitus with hyperglycemia, without long-term current use of insulin ([facility]) We will obtain labs today. We will refill Ozempic 1 mg SubQ once weekly for 1 month with 0 refills.  - Hemoglobin A1c - Insulin, random  -Refill Semaglutide, 1 MG/DOSE, 4 MG/3ML SOPN; Inject 1 mg as directed once a week.  Dispense: 3 mL; Refill:  0  3. Hyperlipidemia associated with type 2 diabetes mellitus ([place]) We will obtain labs today.  - Lipid Panel With LDL/HDL Ratio  4. Vitamin D deficiency We will obtain labs today.  - VITAMIN D 25 Hydroxy (Vit-D Deficiency, Fractures)  5. Obesity with current BMI of 32.9 Patty Williams is currently in the action stage of change. As such, her goal is to continue with weight loss efforts. She has agreed to keeping a food journal and adhering to recommended goals of 1200 calories and 75+ grams of protein daily.  Exercise goals: All adults should avoid inactivity. Some physical activity is better than none, and adults who participate in any amount of physical activity gain some health benefits.  Behavioral modification strategies: increasing lean protein intake, meal planning and cooking strategies, keeping healthy foods in the home, and planning for success.  Emmilyn has agreed to follow-up with our clinic in 3 weeks. She was informed of the importance of frequent follow-up visits to maximize her success with intensive lifestyle modifications for her multiple health conditions.   Talya was informed we would discuss her lab results at her next visit unless there is a critical issue that needs to be addressed sooner. Zsofia agreed to keep her next visit at the agreed upon time to discuss these results.  Objective:   Blood pressure 108/70, pulse 63, temperature 98.4 F (36.9 C), height '5\' 5"'$  (1.651 m), weight 197 lb (89.4 kg), last menstrual period 10/08/2018, SpO2 99 %. Body mass index is 32.78 kg/m.  General: Cooperative, alert, well  developed, in no acute distress. HEENT: Conjunctivae and lids unremarkable. Cardiovascular: Regular rhythm.  Lungs: Normal work of breathing. Neurologic: No focal deficits.   Lab Results  Component Value Date   CREATININE 0.65 07/08/2021   BUN 15 07/08/2021   NA 146 (H) 07/08/2021   K 4.4 07/08/2021   CL 107 (H) 07/08/2021   CO2 25 07/08/2021   Lab Results   Component Value Date   ALT 31 07/08/2021   AST 20 07/08/2021   ALKPHOS 89 07/08/2021   BILITOT 0.3 07/08/2021   Lab Results  Component Value Date   HGBA1C 5.9 (H) 07/08/2021   HGBA1C 6.4 02/11/2021   HGBA1C 6.2 (H) 11/06/2020   HGBA1C 6.4 05/31/2020   HGBA1C 6.5 12/22/2019   Lab Results  Component Value Date   INSULIN 13.2 07/08/2021   INSULIN 22.6 11/06/2020   INSULIN 19.9 06/27/2020   Lab Results  Component Value Date   TSH 1.530 06/27/2020   Lab Results  Component Value Date   CHOL 205 (H) 07/08/2021   HDL 45 07/08/2021   LDLCALC 145 (H) 07/08/2021   LDLDIRECT 144.2 09/21/2008   TRIG 84 07/08/2021   CHOLHDL 4 02/11/2021   Lab Results  Component Value Date   VD25OH 54.9 07/08/2021   VD25OH 95.8 11/06/2020   VD25OH 81.6 06/27/2020   Lab Results  Component Value Date   WBC 5.7 06/27/2020   HGB 13.0 06/27/2020   HCT 39.0 06/27/2020   MCV 87 06/27/2020   PLT 268 06/27/2020   Lab Results  Component Value Date   IRON 62 11/09/2008   FERRITIN 3.9 (L) 11/09/2008   Attestation Statements:   Reviewed by clinician on day of visit: allergies, medications, problem list, medical history, surgical history, family history, social history, and previous encounter notes.  I, Elnora Morrison, RMA am acting as transcriptionist for Coralie Common, MD.  I have reviewed the above documentation for accuracy and completeness, and I agree with the above. - Coralie Common, MD

## 2021-11-28 ENCOUNTER — Other Ambulatory Visit: Payer: Self-pay | Admitting: Internal Medicine

## 2021-11-28 IMAGING — MR MR LUMBAR SPINE W/O CM
5 series · 30 of 48 positions shown · non-contrast
Comparison: CT abdomen and pelvis 10/21/2012

CLINICAL DATA: Low back pain radiating to the hips.

EXAM:
MRI LUMBAR SPINE WITHOUT CONTRAST
TECHNIQUE: Multiplanar, multisequence MR imaging of the lumbar spine was
performed. No intravenous contrast was administered.

[Series 5: T2 · sagittal · 4.0mm · 0.81mm/px · 6 of 17 slices shown (1 of 2)]
[im 1/17]
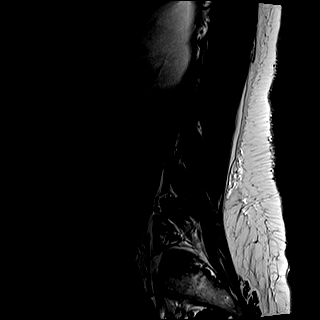
[im 4/17]
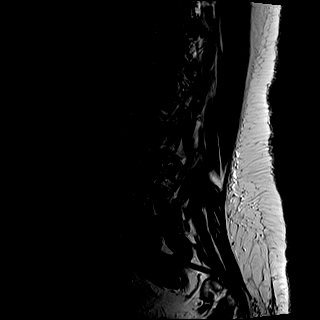
[im 7/17]
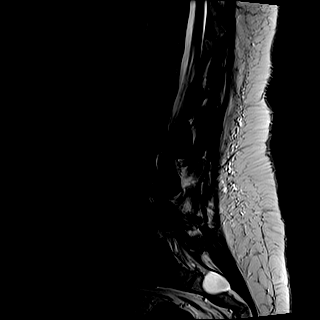
[im 10/17]
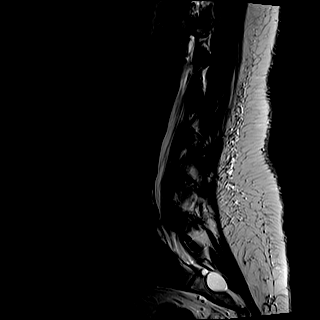
[im 13/17]
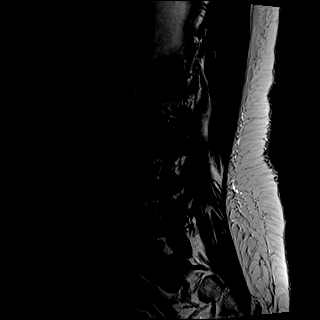
[im 17/17]
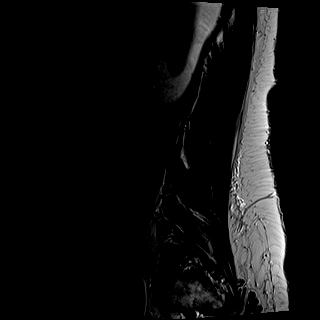

[Series 6: T1 · sagittal · 4.0mm · 0.81mm/px · 7 of 17 slices shown (1 of 2)]
[im 1/17]
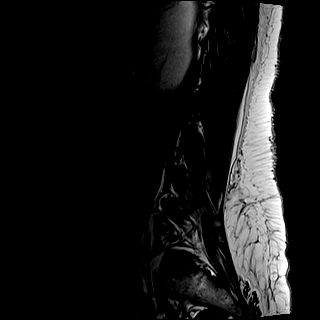
[im 3/17]
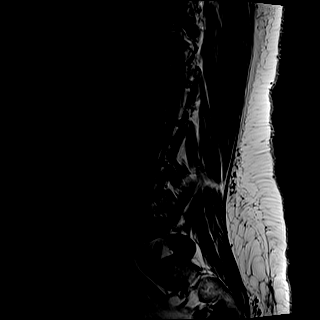
[im 6/17]
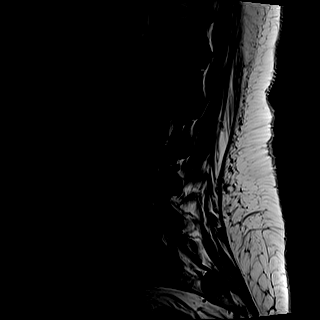
[im 9/17]
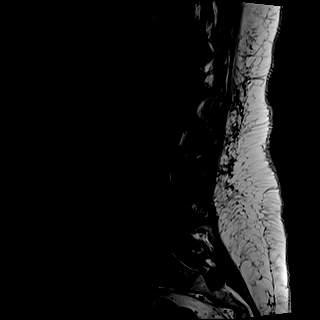
[im 11/17]
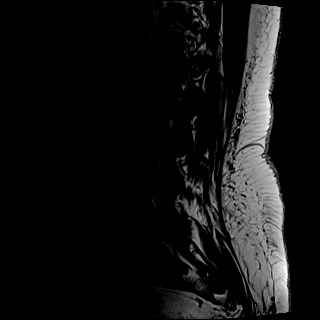
[im 14/17]
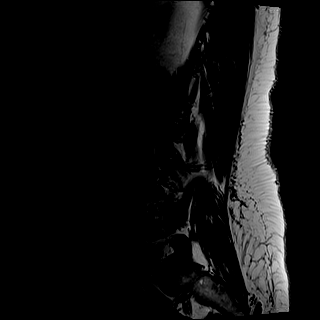
[im 17/17]
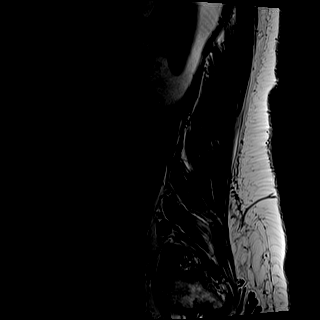

[Series 7: STIR · sagittal · 4.0mm · 0.41mm/px · 1 of 17 slices shown]
[im 1/17]
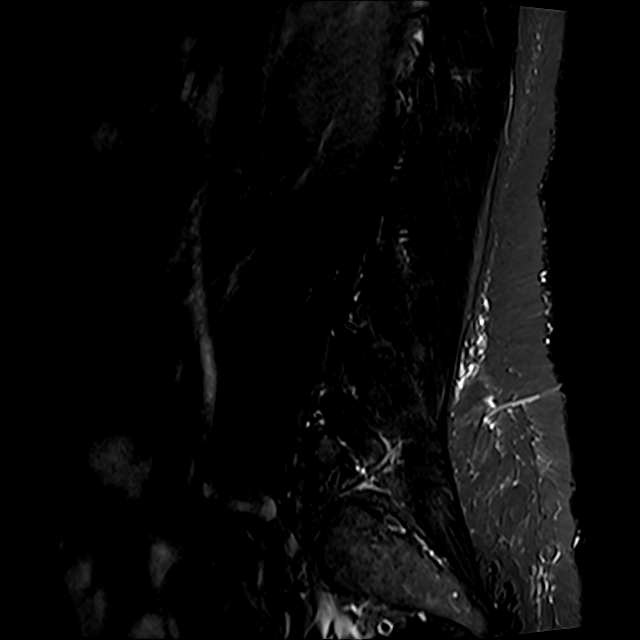

[Series 8: T2 · axial · 4.0mm · 0.78mm/px · z∈[-108,+108]mm · 8 of 36 slices shown (2 of 2)]
[im 1/36]
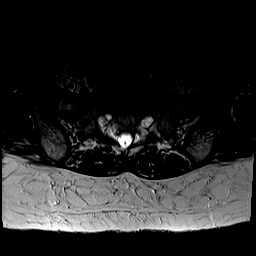
[im 6/36]
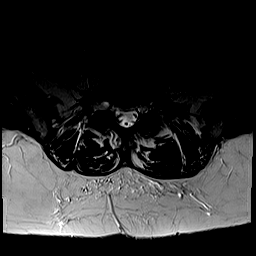
[im 11/36]
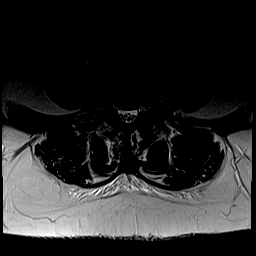
[im 17/36]
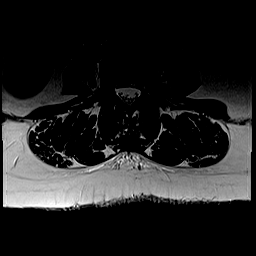
[im 19/36]
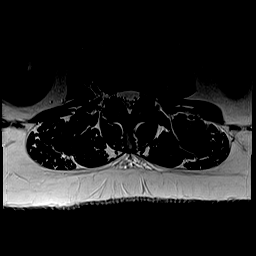
[im 25/36]
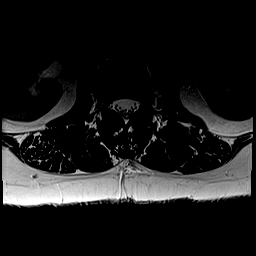
[im 30/36]
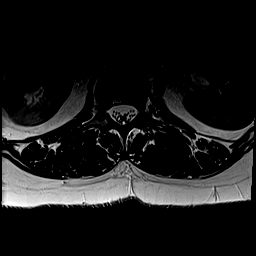
[im 36/36]
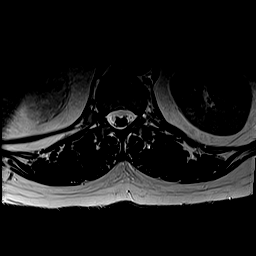

[Series 9: T1 · axial · 4.0mm · 0.39mm/px · z∈[-108,+108]mm · 8 of 36 slices shown (2 of 2)]
[im 1/36]
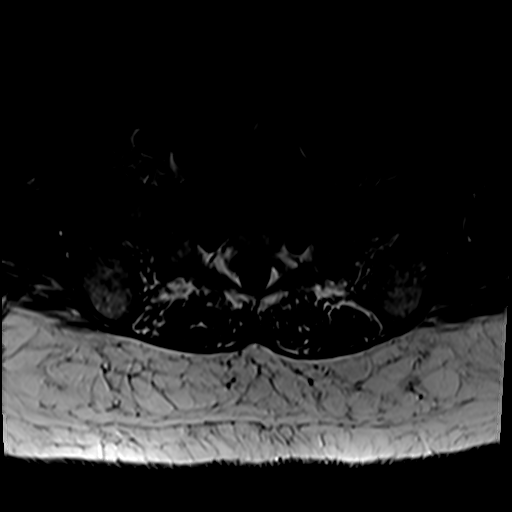
[im 6/36]
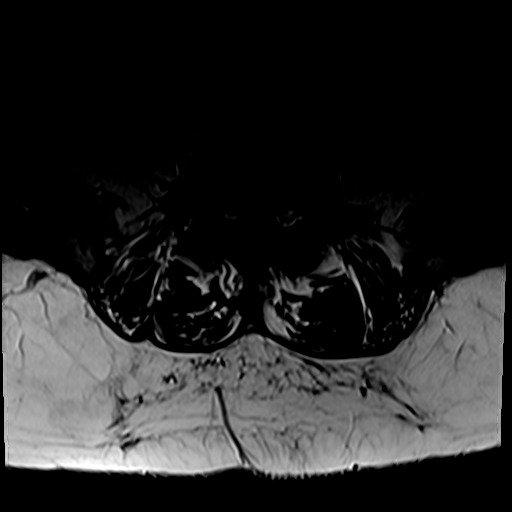
[im 11/36]
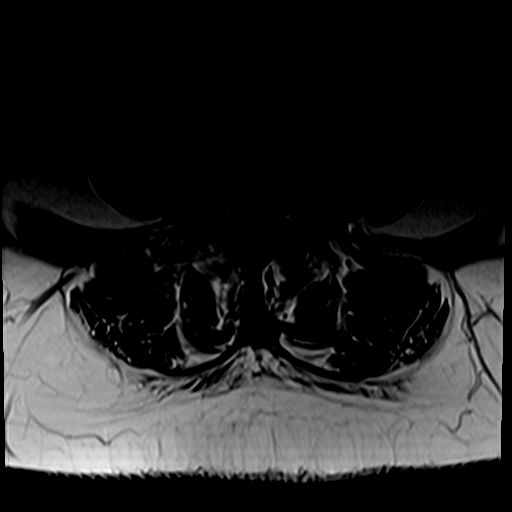
[im 17/36]
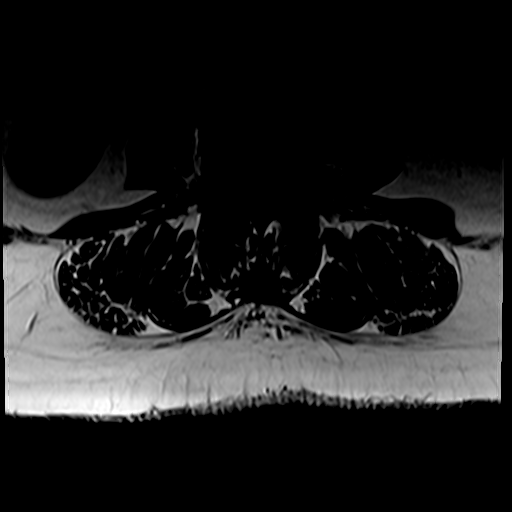
[im 19/36]
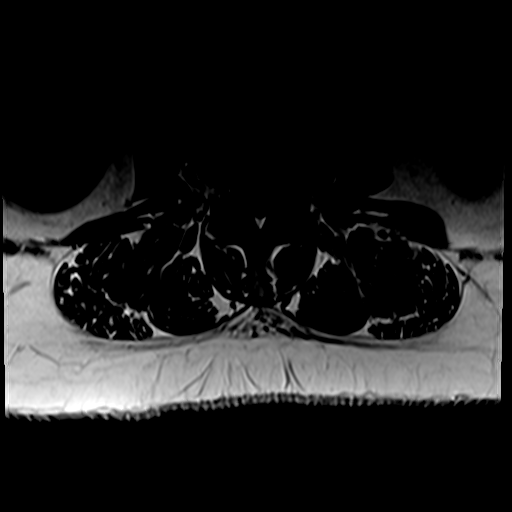
[im 25/36]
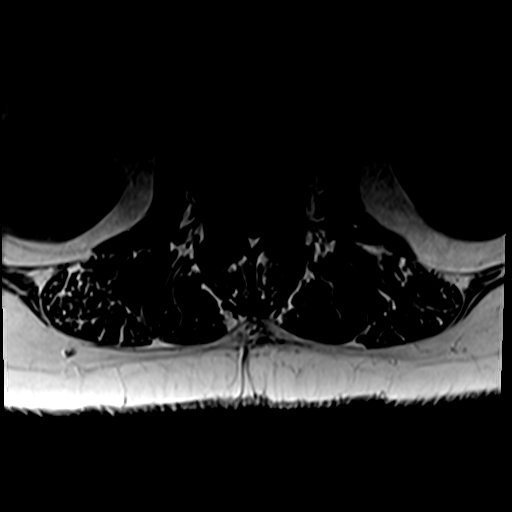
[im 30/36]
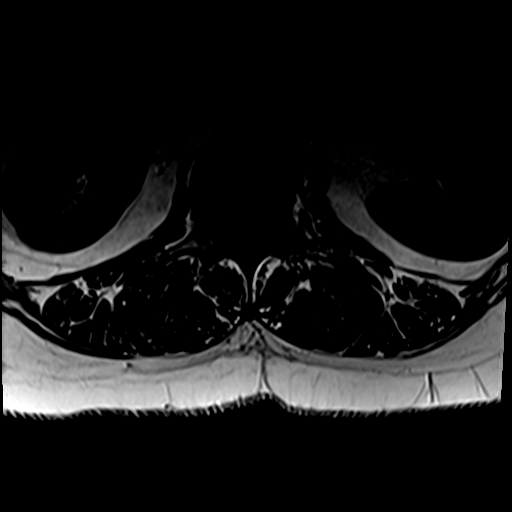
[im 36/36]
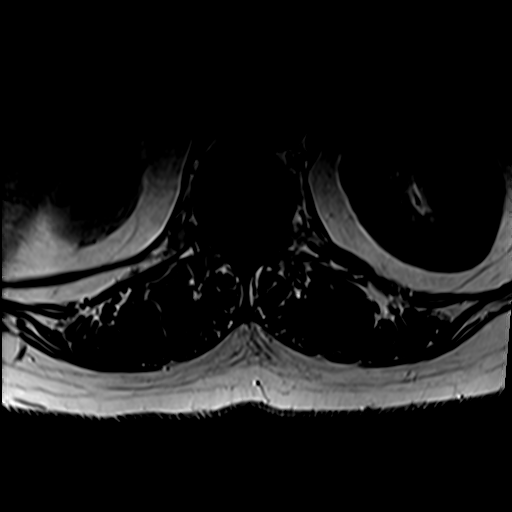

[30 of 48 positions shown; findings below may reference images not displayed]

FINDINGS: Segmentation:  Standard.

Alignment: Trace retrolisthesis of L3 on L4 and trace
anterolisthesis of L4 on L5. Slight left convex curvature of the
lumbar spine.

Vertebrae: No fracture or suspicious osseous lesion. Mild bilateral
facet edema at L4-5. Tarlov cysts at S2.

Conus medullaris and cauda equina: Conus extends to the L1-2 level.
Conus and cauda equina appear normal.

Paraspinal and other soft tissues: Unremarkable.

Disc levels:

Disc desiccation throughout the lumbar spine. Preserved disc space
heights.

T12-L1 and L1-2: Negative.

L2-3: A moderate-sized T2 hyperintense left foraminal and
extraforaminal disc protrusion contacts the extraforaminal left L2
nerve. No significant neural foraminal or spinal stenosis.

L3-4: Mild circumferential disc bulging and mild facet hypertrophy
result in mild-to-moderate bilateral lateral recess stenosis and
mild right neural foraminal stenosis without significant spinal
stenosis.

L4-5: Mild circumferential disc bulging, congenitally short
pedicles, and severe left greater than right facet hypertrophy
result in mild spinal stenosis, mild-to-moderate bilateral lateral
recess stenosis, and mild-to-moderate right and borderline to mild
left neural foraminal stenosis. There are bilateral facet joint
effusions which can be seen in the setting of instability.

L5-S1: A 10 mm cystic focus in the right neural foramen closely
associated with the right L5 nerve root is favored to reflect a
perineural cyst rather than synovial cyst arising from the facet
joint with similar perineural cysts seen bilaterally at S1.
Mild-to-moderate right and mild left facet arthrosis without disc
herniation or stenosis.
IMPRESSION: 1. Severe L4-5 facet arthrosis with mild facet edema and trace
anterolisthesis. Mild-to-moderate lateral recess and right neural
foraminal stenosis.
2. Mild-to-moderate lateral recess stenosis and mild right neural
foraminal stenosis at L3-4.
3. Left foraminal and extraforaminal disc protrusion at L2-3
contacting the extraforaminal left L2 nerve.

## 2021-12-01 ENCOUNTER — Encounter (INDEPENDENT_AMBULATORY_CARE_PROVIDER_SITE_OTHER): Payer: Self-pay | Admitting: Family Medicine

## 2021-12-01 ENCOUNTER — Other Ambulatory Visit: Payer: Self-pay | Admitting: Family Medicine

## 2021-12-02 DIAGNOSIS — H40033 Anatomical narrow angle, bilateral: Secondary | ICD-10-CM | POA: Diagnosis not present

## 2021-12-03 DIAGNOSIS — E785 Hyperlipidemia, unspecified: Secondary | ICD-10-CM | POA: Diagnosis not present

## 2021-12-03 DIAGNOSIS — E1169 Type 2 diabetes mellitus with other specified complication: Secondary | ICD-10-CM | POA: Diagnosis not present

## 2021-12-03 DIAGNOSIS — M9906 Segmental and somatic dysfunction of lower extremity: Secondary | ICD-10-CM | POA: Diagnosis not present

## 2021-12-03 DIAGNOSIS — M9901 Segmental and somatic dysfunction of cervical region: Secondary | ICD-10-CM | POA: Diagnosis not present

## 2021-12-03 DIAGNOSIS — M62451 Contracture of muscle, right thigh: Secondary | ICD-10-CM | POA: Diagnosis not present

## 2021-12-03 DIAGNOSIS — M9903 Segmental and somatic dysfunction of lumbar region: Secondary | ICD-10-CM | POA: Diagnosis not present

## 2021-12-03 DIAGNOSIS — M62830 Muscle spasm of back: Secondary | ICD-10-CM | POA: Diagnosis not present

## 2021-12-03 DIAGNOSIS — R293 Abnormal posture: Secondary | ICD-10-CM | POA: Diagnosis not present

## 2021-12-03 DIAGNOSIS — E559 Vitamin D deficiency, unspecified: Secondary | ICD-10-CM | POA: Diagnosis not present

## 2021-12-03 DIAGNOSIS — I152 Hypertension secondary to endocrine disorders: Secondary | ICD-10-CM | POA: Diagnosis not present

## 2021-12-04 ENCOUNTER — Other Ambulatory Visit: Payer: Self-pay | Admitting: Family Medicine

## 2021-12-04 LAB — COMPREHENSIVE METABOLIC PANEL
ALT: 28 IU/L (ref 0–32)
AST: 21 IU/L (ref 0–40)
Albumin/Globulin Ratio: 1.7 (ref 1.2–2.2)
Albumin: 4.3 g/dL (ref 3.9–4.9)
Alkaline Phosphatase: 88 IU/L (ref 44–121)
BUN/Creatinine Ratio: 25 (ref 12–28)
BUN: 15 mg/dL (ref 8–27)
Bilirubin Total: 0.4 mg/dL (ref 0.0–1.2)
CO2: 26 mmol/L (ref 20–29)
Calcium: 9.6 mg/dL (ref 8.7–10.3)
Chloride: 103 mmol/L (ref 96–106)
Creatinine, Ser: 0.61 mg/dL (ref 0.57–1.00)
Globulin, Total: 2.5 g/dL (ref 1.5–4.5)
Glucose: 102 mg/dL — ABNORMAL HIGH (ref 70–99)
Potassium: 4.5 mmol/L (ref 3.5–5.2)
Sodium: 142 mmol/L (ref 134–144)
Total Protein: 6.8 g/dL (ref 6.0–8.5)
eGFR: 102 mL/min/{1.73_m2} (ref >59)

## 2021-12-04 LAB — INSULIN, RANDOM: INSULIN: 19.5 u[IU]/mL (ref 2.6–24.9)

## 2021-12-04 LAB — HEMOGLOBIN A1C
Est. average glucose Bld gHb Est-mCnc: 117 mg/dL
Hgb A1c MFr Bld: 5.7 % — ABNORMAL HIGH (ref 4.8–5.6)

## 2021-12-04 LAB — LIPID PANEL WITH LDL/HDL RATIO
Cholesterol, Total: 204 mg/dL — ABNORMAL HIGH (ref 100–199)
HDL: 48 mg/dL (ref >39)
LDL Chol Calc (NIH): 138 mg/dL — ABNORMAL HIGH (ref 0–99)
LDL/HDL Ratio: 2.9 ratio (ref 0.0–3.2)
Triglycerides: 99 mg/dL (ref 0–149)
VLDL Cholesterol Cal: 18 mg/dL (ref 5–40)

## 2021-12-04 LAB — VITAMIN D 25 HYDROXY (VIT D DEFICIENCY, FRACTURES): Vit D, 25-Hydroxy: 51.9 ng/mL (ref 30.0–100.0)

## 2021-12-05 ENCOUNTER — Ambulatory Visit (INDEPENDENT_AMBULATORY_CARE_PROVIDER_SITE_OTHER): Payer: BC Managed Care – PPO | Admitting: Family Medicine

## 2021-12-05 ENCOUNTER — Encounter: Payer: Self-pay | Admitting: Physician Assistant

## 2021-12-05 ENCOUNTER — Telehealth: Payer: Self-pay | Admitting: Internal Medicine

## 2021-12-05 ENCOUNTER — Encounter: Payer: Self-pay | Admitting: Family Medicine

## 2021-12-05 ENCOUNTER — Encounter: Payer: Self-pay | Admitting: Internal Medicine

## 2021-12-05 VITALS — BP 122/80 | HR 77 | Ht 65.0 in | Wt 202.4 lb

## 2021-12-05 DIAGNOSIS — E7849 Other hyperlipidemia: Secondary | ICD-10-CM | POA: Diagnosis not present

## 2021-12-05 DIAGNOSIS — I10 Essential (primary) hypertension: Secondary | ICD-10-CM

## 2021-12-05 DIAGNOSIS — E119 Type 2 diabetes mellitus without complications: Secondary | ICD-10-CM

## 2021-12-05 NOTE — Assessment & Plan Note (Addendum)
bp in fair control at this time  BP Readings from Last 1 Encounters:  12/05/21 122/80   No changes needed This has been stable/well controlled for over a year  No symptoms No side effects from medicines  Most recent labs reviewed  Disc lifstyle change with low sodium diet and exercise  Plan to continue benicar 40 mg daily

## 2021-12-05 NOTE — Assessment & Plan Note (Signed)
Lab Results  Component Value Date   HGBA1C 5.7 (H) 12/03/2021   Doing well/ very good control with semaglutide 1 mg weekly

## 2021-12-05 NOTE — Progress Notes (Signed)
Current Outpatient Medications on File Prior to Visit  Medication Sig Dispense Refill   albuterol (VENTOLIN HFA) 108 (90 Base) MCG/ACT inhaler Inhale 2 puffs into the lungs every 4 (four) hours as needed. 8.5 g 0   budesonide-formoterol (SYMBICORT) 80-4.5 MCG/ACT inhaler USE 2 INHALATIONS TWICE A DAY 10.2 g 3   docusate sodium (COLACE) 100 MG capsule Take 100 mg by mouth daily as needed for mild constipation.     Multiple Vitamin (MULTIVITAMIN) tablet Take 1 tablet by mouth daily.     naproxen (NAPROSYN) 500 MG tablet Take 1 tablet (500 mg total) by mouth 2 (two) times daily as needed for moderate pain. With a meal 60 tablet 3   olmesartan (BENICAR) 40 MG tablet Take 1 tablet (40 mg total) by mouth daily. 30 tablet 2   Omega-3 Fatty Acids (FISH OIL) 1000 MG CAPS Take 2,000 mg by mouth daily.     pantoprazole (PROTONIX) 40 MG tablet Take 1 tablet (40 mg total) by mouth 2 (two) times daily before a meal. Breakfast and supper 60 tablet 11   Semaglutide, 1 MG/DOSE, 4 MG/3ML SOPN Inject 1 mg as directed once a week. 3 mL 0   Turmeric (QC TUMERIC COMPLEX PO) Take 2,000 mg by mouth daily.     No current facility-administered medications on file prior to visit.

## 2021-12-05 NOTE — Progress Notes (Signed)
Subjective:    Patient ID: Patty Williams, female    DOB: 1960/12/04, 61 y.o.   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   Has f/u aug first   Still struggles with back pain  PT, acupuncture and injections  Has referral to pain clinic to get opinion   Takes 2 naproxen a week  Wants to avoid when possible  Benicar 40 mg daily  Hyperlipidemia Lab Results  Component Value Date   CHOL 204 (H) 12/03/2021   HDL 48 12/03/2021   [place] 138 (H) 12/03/2021   LDLDIRECT 144.2 09/21/2008   TRIG 99 12/03/2021   CHOLHDL 4 02/11/2021   Wanted to avoid a statin  Does not want to take anything   Diet : is fair  Her partner likes to eat fats (keto)  Could eat more fish   Not much fried foods   Stationary bike Upper body work at [employer]   The 10-year ASCVD risk score (Arnett DK, et al., 2019) is: 9.3%   Values used to calculate the score:     Age: 47 years     Sex: Female     Is Non-Hispanic African American: No     Diabetic: Yes     Tobacco smoker: No     Systolic Blood Pressure: 662 mmHg     Is BP treated: Yes     HDL Cholesterol: 48 mg/dL     Total Cholesterol: 204 mg/dL  Prediabetes Lab  Results  Component Value Date   HGBA1C 5.7 (H) 12/03/2021   Down from 6.2  Doing well    Asthma is well controlled Treated for sleep apnea -this is also well controlled   Patient Active Problem List   Diagnosis Date Noted   Type 2 diabetes mellitus with other specified complication ([place]) 94/76/5465   Lumbar disc disease 02/11/2021   Other fatigue 06/27/2020   Screening for depression 06/27/2020   Vitamin D deficiency 06/27/2020   At risk for heart disease 06/27/2020   Intermittent diarrhea 03/15/2020   Hx of adenomatous polyp of colon 03/24/2019   Family history of pulmonary fibrosis 12/23/2018   Family history of kidney disease 12/23/2018   Colon cancer screening 12/23/2018   Vaginal bleeding 09/22/2018   Current use of proton pump inhibitor 10/18/2015   History of fracture 10/18/2015   Left knee pain 04/19/2015   Frequent UTI 10/06/2013   Seasonal and perennial allergic rhinitis 01/01/2013   Routine general medical examination at a health care facility 04/30/2011   Controlled type 2 diabetes mellitus without  complication, without long-term current use of insulin ([place])    Plantar fasciitis    METATARSALGIA 04/11/2010   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   Obstructive sleep apnea 06/10/2009   GERD 11/09/2008   Hyperlipidemia 07/20/2008   Essential hypertension, benign 03/02/2008   Class 1 obesity due to excess calories with serious comorbidity and body mass index (BMI) of 34.0 to 34.9 in adult 11/18/2007   Allergic asthma, mild intermittent, uncomplicated 85/88/5027   Endometriosis 03/29/2007   Past Medical History:  Diagnosis Date   Anxiety    Asthma    Back pain    Bilateral ovarian cysts    Bruxism    Constipation    Diabetes mellitus type II    mild   Diverticulosis 2014   Mild   Dry mouth    Elevated blood pressure reading without diagnosis of hypertension    Endometriosis    Severe   Fibroids    GERD (gastroesophageal reflux disease)    History  of ovarian cyst    HLD (hyperlipidemia)    Hx of adenomatous polyp of colon 03/24/2019   Hyperglycemia    Hyperlipidemia    Hypertension    Iron deficiency anemia    Joint pain    Menorrhagia    Obesity    Obesity    OSA on CPAP    Plantar fasciitis    with orthotics-much difficulty adjusting these   PMB (postmenopausal bleeding)    Pneumonia    history of   PONV (postoperative nausea and vomiting)    Pre-diabetes    Rectovaginal fistula    Sleep apnea    Uterine adhesion    Uterine fibroid    Wears glasses    Past Surgical History:  Procedure Laterality Date   COLONOSCOPY  01/2009   DILATATION & CURETTAGE/HYSTEROSCOPY WITH MYOSURE N/A 09/22/2018   Procedure: DILATATION & CURETTAGE/HYSTEROSCOPY;  Surgeon: Aloha Gell, MD;  Location: [facility];  Service: Gynecology;  Laterality: N/A;   HEMORRHOID SURGERY     LAPAROSCOPY     endometriosis   NASAL SEPTOPLASTY W/ TURBINOPLASTY Bilateral 09/02/2021   Procedure: NASAL SEPTOPLASTY WITH SUBMUCOSAL RESECTION OF TURBINATES;  Surgeon: Beverly Gust, MD;  Location: [facility];  Service: ENT;  Laterality: Bilateral;   TONSILLECTOMY AND ADENOIDECTOMY     TRIGGER FINGER RELEASE     UPPER GI ENDOSCOPY  01/2009   uterine biopsy     uterine cyst exploration     UTERINE SUSPENSION     WISDOM TOOTH EXTRACTION     Social History   Tobacco Use   Smoking status: Never   Smokeless tobacco: Never  Vaping Use   Vaping Use: Never used  Substance Use Topics   Alcohol use: Yes    Comment: occasionally   Drug use: No   Family History  Problem Relation Age of Onset   Thyroid disease Mother        ?   Diabetes Mother    Hypertension Mother    Hyperlipidemia Mother    Anxiety disorder Mother    Sleep apnea Mother    Obesity Mother    Cancer Father        prostate   Hypertension Father    Obesity Father    Prostate cancer Father    Heart attack Other        grandfather (in his 46's)   Parkinsonism Other         grandfater   Stomach cancer Neg Hx    Colon  cancer Neg Hx    Esophageal cancer Neg Hx    No Known Allergies Current Outpatient Medications on File Prior to Visit  Medication Sig Dispense Refill   albuterol (VENTOLIN HFA) 108 (90 Base) MCG/ACT inhaler Inhale 2 puffs into the lungs every 4 (four) hours as needed. 8.5 g 0   budesonide-formoterol (SYMBICORT) 80-4.5 MCG/ACT inhaler USE 2 INHALATIONS TWICE A DAY 10.2 g 3   docusate sodium (COLACE) 100 MG capsule Take 100 mg by mouth daily as needed for mild constipation.     Multiple Vitamin (MULTIVITAMIN) tablet Take 1 tablet by mouth daily.     naproxen (NAPROSYN) 500 MG tablet Take 1 tablet (500 mg total) by mouth 2 (two) times daily as needed for moderate pain. With a meal 60 tablet 3   olmesartan (BENICAR) 40 MG tablet Take 1 tablet (40 mg total) by mouth daily. 30 tablet 2   Omega-3 Fatty Acids (FISH OIL) 1000 MG CAPS Take 2,000 mg by mouth daily.     pantoprazole (PROTONIX) 40 MG tablet Take 1 tablet (40 mg total) by mouth 2 (two) times daily before a meal. Breakfast and supper 60 tablet 11   Semaglutide, 1 MG/DOSE, 4 MG/3ML SOPN Inject 1 mg as directed once a week. 3 mL 0   Turmeric (QC TUMERIC COMPLEX PO) Take 2,000 mg by mouth daily.     No current facility-administered medications on file prior to visit.    Review of Systems  Constitutional:  Positive for fatigue. Negative for activity change, appetite change, fever and unexpected weight change.       Fatigue from busy work schedule   HENT:  Negative for congestion, ear pain, rhinorrhea, sinus pressure and sore throat.   Eyes:  Negative for pain, redness and visual disturbance.  Respiratory:  Negative for cough, shortness of breath and wheezing.   Cardiovascular:  Negative for chest pain and palpitations.  Gastrointestinal:  Negative for abdominal pain, blood in stool, constipation and diarrhea.  Endocrine: Negative for polydipsia and polyuria.  Genitourinary:  Negative for  dysuria, frequency and urgency.  Musculoskeletal:  Negative for arthralgias, back pain and myalgias.  Skin:  Negative for pallor and rash.  Allergic/Immunologic: Negative for environmental allergies.  Neurological:  Negative for dizziness, syncope and headaches.  Hematological:  Negative for adenopathy. Does not bruise/bleed easily.  Psychiatric/Behavioral:  Negative for decreased concentration and dysphoric mood. The patient is not nervous/anxious.        Objective:   Physical Exam Constitutional:      General: She is not in acute distress.    Appearance: Normal appearance. She is well-developed. She is obese. She is not ill-appearing or diaphoretic.  HENT:     Head: Normocephalic and atraumatic.  Eyes:     Conjunctiva/sclera: Conjunctivae normal.     Pupils: Pupils are equal, round, and reactive to light.  Neck:     Thyroid: No thyromegaly.     Vascular: No carotid bruit or JVD.  Cardiovascular:     Rate and Rhythm: Normal rate and regular rhythm.     Pulses: Normal pulses.     Heart sounds: Normal heart sounds. No murmur heard.    No gallop.  Pulmonary:     Effort: Pulmonary effort is normal. No respiratory distress.     Breath sounds: Normal breath sounds. No wheezing or rales.  Abdominal:     General: There is no distension or abdominal bruit.     Palpations: Abdomen is soft. There is no mass.  Tenderness: There is no abdominal tenderness. There is no guarding or rebound.  Musculoskeletal:     Cervical back: Normal range of motion and neck supple.     Right lower leg: No edema.     Left lower leg: No edema.  Lymphadenopathy:     Cervical: No cervical adenopathy.  Skin:    General: Skin is warm and dry.     Coloration: Skin is not pale.     Findings: No erythema or rash.  Neurological:     Mental Status: She is alert.     Coordination: Coordination normal.     Deep Tendon Reflexes: Reflexes are normal and symmetric. Reflexes normal.  Psychiatric:        Mood  and Affect: Mood normal.        Cognition and Memory: Cognition and memory normal.     Comments: Pleasant            Assessment & Plan:   Problem List Items Addressed This Visit       Cardiovascular and Mediastinum   Essential hypertension, benign - Primary    bp in fair control at this time  BP Readings from Last 1 Encounters:  12/05/21 122/80  No changes needed This has been stable/well controlled for over a year  No symptoms No side effects from medicines  Most recent labs reviewed  Disc lifstyle change with low sodium diet and exercise  Plan to continue benicar 40 mg daily          Endocrine   Controlled type 2 diabetes mellitus without complication, without long-term current use of insulin ([facility])    Lab Results  Component Value Date   HGBA1C 5.7 (H) 12/03/2021  Doing well/ very good control with semaglutide 1 mg weekly        Other   Hyperlipidemia    Disc goals for lipids and reasons to control them Rev last labs with pt Rev low sat fat diet in detail LDL of 138  Discussed the importance of a statin for CVD risk in diabetes  Encouraged her to consider it

## 2021-12-05 NOTE — Patient Instructions (Addendum)
Avoid red meat/ fried foods/ egg yolks/ fatty breakfast meats/ butter, cheese and high fat dairy/ and shellfish   It cholesterol does not improve we can consider statin medicine or zetia   I will have your progress note ready to pick up Monday   Continue your current medications Blood pressure is well controlled   Keep up the good work with diet and exercise and weight loss!  Take care of yourself

## 2021-12-05 NOTE — Telephone Encounter (Signed)
Holly, have you received these forms?

## 2021-12-05 NOTE — Assessment & Plan Note (Signed)
Disc goals for lipids and reasons to control them Rev last labs with pt Rev low sat fat diet in detail LDL of 138  Discussed the importance of a statin for CVD risk in diabetes  Encouraged her to consider it

## 2021-12-08 ENCOUNTER — Encounter (INDEPENDENT_AMBULATORY_CARE_PROVIDER_SITE_OTHER): Payer: Self-pay | Admitting: Family Medicine

## 2021-12-08 ENCOUNTER — Other Ambulatory Visit (INDEPENDENT_AMBULATORY_CARE_PROVIDER_SITE_OTHER): Payer: Self-pay | Admitting: Family Medicine

## 2021-12-08 DIAGNOSIS — M216X1 Other acquired deformities of right foot: Secondary | ICD-10-CM | POA: Diagnosis not present

## 2021-12-08 DIAGNOSIS — M216X2 Other acquired deformities of left foot: Secondary | ICD-10-CM | POA: Diagnosis not present

## 2021-12-08 DIAGNOSIS — M7742 Metatarsalgia, left foot: Secondary | ICD-10-CM | POA: Diagnosis not present

## 2021-12-08 DIAGNOSIS — M2042 Other hammer toe(s) (acquired), left foot: Secondary | ICD-10-CM | POA: Diagnosis not present

## 2021-12-08 NOTE — Telephone Encounter (Signed)
Please check up front. I think we had thosee ready and [name] was putting them up front for patient to pick up.

## 2021-12-08 NOTE — Telephone Encounter (Signed)
Dr Annamaria Boots, please advise once the forms have been signed. Thank you.

## 2021-12-15 ENCOUNTER — Ambulatory Visit (INDEPENDENT_AMBULATORY_CARE_PROVIDER_SITE_OTHER): Payer: BC Managed Care – PPO | Admitting: Family Medicine

## 2021-12-15 ENCOUNTER — Encounter (INDEPENDENT_AMBULATORY_CARE_PROVIDER_SITE_OTHER): Payer: Self-pay | Admitting: Family Medicine

## 2021-12-15 VITALS — BP 108/75 | HR 72 | Temp 98.1°F | Ht 65.0 in | Wt 196.0 lb

## 2021-12-15 DIAGNOSIS — Z6832 Body mass index (BMI) 32.0-32.9, adult: Secondary | ICD-10-CM | POA: Diagnosis not present

## 2021-12-15 DIAGNOSIS — E669 Obesity, unspecified: Secondary | ICD-10-CM | POA: Diagnosis not present

## 2021-12-15 DIAGNOSIS — R6889 Other general symptoms and signs: Secondary | ICD-10-CM | POA: Diagnosis not present

## 2021-12-15 DIAGNOSIS — E1165 Type 2 diabetes mellitus with hyperglycemia: Secondary | ICD-10-CM | POA: Insufficient documentation

## 2021-12-15 DIAGNOSIS — Z7985 Long-term (current) use of injectable non-insulin antidiabetic drugs: Secondary | ICD-10-CM

## 2021-12-15 MED ORDER — SEMAGLUTIDE (1 MG/DOSE) 4 MG/3ML ~~LOC~~ SOPN: 1.0000 mg | PEN_INJECTOR | SUBCUTANEOUS | 0 refills | Status: DC

## 2021-12-16 LAB — T3: T3, Total: 87 ng/dL (ref 71–180)

## 2021-12-16 LAB — T4, FREE: Free T4: 1.27 ng/dL (ref 0.82–1.77)

## 2021-12-16 LAB — TSH: TSH: 1.16 u[IU]/mL (ref 0.450–4.500)

## 2021-12-17 ENCOUNTER — Encounter (INDEPENDENT_AMBULATORY_CARE_PROVIDER_SITE_OTHER): Payer: Self-pay

## 2021-12-22 NOTE — Progress Notes (Unsigned)
Chief Complaint:   OBESITY Patty Williams is here to discuss her progress with her obesity treatment plan along with follow-up of her obesity related diagnoses. Patty Williams is on keeping a food journal and adhering to recommended goals of 1200 calories and 75+ grams of protein daily and states she is following her eating plan approximately 40% of the time. Patty Williams states she is walking for 20 minutes 2 times per week.  Today's visit was #: 20 Starting weight: 229 lbs Starting date: 06/27/2020 Today's weight: 196 lbs Today's date: 12/15/2021 Total lbs lost to date: 33 Total lbs lost since last in-office visit: 1  Interim History: Patty Williams continues to do well with weight loss.  She is doing well with decreasing added sugar in her diet and she is trying to meet her protein goals.  Her hunger is controlled and she is working on increasing activity.  Subjective:   1. Type 2 diabetes mellitus with hyperglycemia, without long-term current use of insulin ([place]) Patty Williams is stable on Ozempic, and she denies nausea, vomiting, or hypoglycemia.  I discussed labs with the patient today.  2. Heat intolerance Patty Williams notes an increase in heat intolerance, often vacillating between hot and cold. She denies a history of thyroid issues.    Assessment/Plan:   1. Type 2 diabetes mellitus with hyperglycemia, without long-term current use of insulin ([place]) Patty Williams will continue Ozempic 1 mg once weekly, and we will refill for 1 month.  - Semaglutide, 1 MG/DOSE, 4 MG/3ML SOPN; Inject 1 mg as directed once a week.  Dispense: 3 mL; Refill: 0  2. Heat intolerance We will check labs today, and we will follow-up at [facility] next visit.  - T3 - T4, free - TSH  3. Obesity, Current BMI 32.7 Patty Williams is currently in the action stage of change. As such, her goal is to continue with weight loss efforts. She has agreed to keeping a food journal and adhering to recommended goals of 1200 calories and 75+ grams of protein daily.   Exercise goals: As  is.   Behavioral modification strategies: increasing lean protein intake.  Patty Williams has agreed to follow-up with our clinic in 3 to 4 weeks. She was informed of the importance of frequent follow-up visits to maximize her success with intensive lifestyle modifications for her multiple health conditions.   Patty Williams was informed we would discuss her lab results at her next visit unless there is a critical issue that needs to be addressed sooner. Patty Williams agreed to keep her next visit at the agreed upon time to discuss these results.  Objective:   Blood pressure 108/75, pulse 72, temperature 98.1 F (36.7 C), height '5\' 5"'$  (1.651 m), weight 196 lb (88.9 kg), last menstrual period 10/08/2018, SpO2 90 %. Body mass index is 32.62 kg/m.  General: Cooperative, alert, well developed, in no acute distress. HEENT: Conjunctivae and lids unremarkable. Cardiovascular: Regular rhythm.  Lungs: Normal work of breathing. Neurologic: No focal deficits.   Lab Results  Component Value Date   CREATININE 0.61 12/03/2021   BUN 15 12/03/2021   NA 142 12/03/2021   K 4.5 12/03/2021   CL 103 12/03/2021   CO2 26 12/03/2021   Lab Results  Component Value Date   ALT 28 12/03/2021   AST 21 12/03/2021   ALKPHOS 88 12/03/2021   BILITOT 0.4 12/03/2021   Lab Results  Component Value Date   HGBA1C 5.7 (H) 12/03/2021   HGBA1C 5.9 (H) 07/08/2021   HGBA1C 6.4 02/11/2021   HGBA1C 6.2 (  H) 11/06/2020   HGBA1C 6.4 05/31/2020   Lab Results  Component Value Date   INSULIN 19.5 12/03/2021   INSULIN 13.2 07/08/2021   INSULIN 22.6 11/06/2020   INSULIN 19.9 06/27/2020   Lab Results  Component Value Date   TSH 1.160 12/15/2021   Lab Results  Component Value Date   CHOL 204 (H) 12/03/2021   HDL 48 12/03/2021   LDLCALC 138 (H) 12/03/2021   LDLDIRECT 144.2 09/21/2008   TRIG 99 12/03/2021   CHOLHDL 4 02/11/2021   Lab Results  Component Value Date   VD25OH 51.9 12/03/2021   VD25OH 54.9 07/08/2021   VD25OH 95.8  11/06/2020   Lab Results  Component Value Date   WBC 5.7 06/27/2020   HGB 13.0 06/27/2020   HCT 39.0 06/27/2020   MCV 87 06/27/2020   PLT 268 06/27/2020   Lab Results  Component Value Date   IRON 62 11/09/2008   FERRITIN 3.9 (L) 11/09/2008   Attestation Statements:   Reviewed by clinician on day of visit: allergies, medications, problem list, medical history, surgical history, family history, social history, and previous encounter notes.  I, Trixie Dredge, am acting as transcriptionist for Dennard Nip, MD.  I have reviewed the above documentation for accuracy and completeness, and I agree with the above. -  Dennard Nip, MD

## 2021-12-26 ENCOUNTER — Telehealth: Payer: Self-pay | Admitting: Family Medicine

## 2021-12-26 ENCOUNTER — Other Ambulatory Visit: Payer: Self-pay | Admitting: Family Medicine

## 2021-12-26 NOTE — Telephone Encounter (Signed)
Patient stated she has been getting labs with Dr. Leafy Ro and not sure if she will need all of them done for her labs. Thank you!

## 2021-12-29 ENCOUNTER — Other Ambulatory Visit: Payer: Self-pay | Admitting: Family Medicine

## 2021-12-29 NOTE — Telephone Encounter (Signed)
A1c for her appt in early oct would be too early  She will need lipids and cbc- I think that is it  If she does not want to come earlier for labs we can just draw her labs the day of the visit (fast for 4 hours prior if able)-the appt is for PE Let me know if she wants to come earlier for labs and I will put the order in

## 2021-12-29 NOTE — Telephone Encounter (Signed)
The last lab  that she had checked was for her thyroid,  is there any more you like to have done,please advise.

## 2021-12-29 NOTE — Telephone Encounter (Signed)
Patient returned call, gave message below and she verbalized understanding.

## 2021-12-29 NOTE — Telephone Encounter (Signed)
Pt wants  to if she need have more lab done for her appt in oct

## 2021-12-29 NOTE — Telephone Encounter (Signed)
Called and lvm for ptt call back.

## 2022-01-05 ENCOUNTER — Ambulatory Visit (INDEPENDENT_AMBULATORY_CARE_PROVIDER_SITE_OTHER): Payer: BC Managed Care – PPO | Admitting: Family Medicine

## 2022-01-05 ENCOUNTER — Encounter (INDEPENDENT_AMBULATORY_CARE_PROVIDER_SITE_OTHER): Payer: Self-pay | Admitting: Family Medicine

## 2022-01-05 VITALS — BP 115/77 | HR 63 | Temp 97.8°F | Ht 65.0 in | Wt 197.0 lb

## 2022-01-05 DIAGNOSIS — Z6832 Body mass index (BMI) 32.0-32.9, adult: Secondary | ICD-10-CM | POA: Diagnosis not present

## 2022-01-05 DIAGNOSIS — E1169 Type 2 diabetes mellitus with other specified complication: Secondary | ICD-10-CM | POA: Diagnosis not present

## 2022-01-05 DIAGNOSIS — E669 Obesity, unspecified: Secondary | ICD-10-CM | POA: Diagnosis not present

## 2022-01-05 DIAGNOSIS — Z7985 Long-term (current) use of injectable non-insulin antidiabetic drugs: Secondary | ICD-10-CM

## 2022-01-05 DIAGNOSIS — E785 Hyperlipidemia, unspecified: Secondary | ICD-10-CM | POA: Diagnosis not present

## 2022-01-05 DIAGNOSIS — E7849 Other hyperlipidemia: Secondary | ICD-10-CM | POA: Insufficient documentation

## 2022-01-05 MED ORDER — SEMAGLUTIDE (1 MG/DOSE) 4 MG/3ML ~~LOC~~ SOPN: 1.0000 mg | PEN_INJECTOR | SUBCUTANEOUS | 0 refills | Status: DC

## 2022-01-05 NOTE — Patient Instructions (Signed)
The 10-year ASCVD risk score (Arnett DK, et al., 2019) is: 8.3%   Values used to calculate the score:     Age: 61 years     Sex: Female     Is Non-Hispanic African American: No     Diabetic: Yes     Tobacco smoker: No     Systolic Blood Pressure: 867 mmHg     Is BP treated: Yes     HDL Cholesterol: 48 mg/dL     Total Cholesterol: 204 mg/dL

## 2022-01-06 DIAGNOSIS — M47816 Spondylosis without myelopathy or radiculopathy, lumbar region: Secondary | ICD-10-CM | POA: Diagnosis not present

## 2022-01-12 NOTE — Progress Notes (Signed)
Chief Complaint:   OBESITY Patty Williams is here to discuss her progress with her obesity treatment plan along with follow-up of her obesity related diagnoses. Patty Williams is on keeping a food journal and adhering to recommended goals of 1200 calories and 75+ grams protein and states she is following her eating plan approximately 20% of the time. Patty Williams states she is walking 20 minutes 3 times per week.  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  Subjective:   1. Type 2 diabetes mellitus with other specified complication, unspecified whether long term insulin use ([place]) Discussed labs with patient today. Patty Williams sometimes has reflux symptoms and "stomach fullness and feels a little nauseated" when she skips meals. Her A1c is 5.7 and very well controlled! Pt's highest A1c was 6.5 a couple of years ago.  2. Hyperlipidemia associated with type 2 diabetes mellitus (Patty Williams) Discussed labs with patient today. ASCVD discussed with pt. Her LDL has come down some since starting the meal plan but is still above goal. Pt is not on any statins or meds.  The 10-year ASCVD risk score (Arnett DK, et al., 2019) is: 9%   Values used to calculate the score:     Age: 61 years     Sex: Female     Is Non-Hispanic African American: No     Diabetic: Yes     Tobacco smoker: No     Systolic Blood Pressure: 357 mmHg     Is BP treated: Yes     HDL Cholesterol: 48 mg/dL     Total Cholesterol: 204 mg/dL   Assessment/Plan:  No orders of the defined types were placed in this encounter.   Medications Discontinued During This Encounter  Medication Reason   Semaglutide, 1 MG/DOSE, 4 MG/3ML SOPN Reorder     Meds ordered this encounter  Medications   Semaglutide, 1 MG/DOSE, 4 MG/3ML SOPN     Sig: Inject 1 mg as directed once a week.    Dispense:  3 mL    Refill:  0     1. Type 2 diabetes mellitus with other specified complication, unspecified whether long term insulin use (HCC) Good blood sugar control is important to decrease the likelihood of diabetic complications such as nephropathy, neuropathy, limb loss, blindness, coronary artery disease, and death. Intensive lifestyle modification including diet, exercise and weight loss are the first line of treatment for diabetes.  Long discussion with pt on how certain foods will affect her blood sugars and insulin response , and therefore, hunger and cravings. Continue Ozempic 1 mg weekly.  Refill- Semaglutide, 1 MG/DOSE, 4 MG/3ML SOPN; Inject 1 mg as directed once a week.  Dispense: 3 mL; Refill: 0  2. Hyperlipidemia associated with type 2 diabetes mellitus (Patty Williams) Cardiovascular risk and specific lipid/LDL goals reviewed.  We discussed several lifestyle modifications today and Patty Williams will continue to work on diet, exercise and weight loss efforts. Orders and follow up as documented in patient record.  Reviewed with pt her elevated risks of disease being a diabetic. I recommend she discuss with her PCP whether she recommends statin therapy or not.  Counseling Intensive lifestyle modifications are the first line treatment for this issue. Dietary changes: Increase soluble fiber. Decrease simple carbohydrates. Exercise changes: Moderate to  vigorous-intensity aerobic activity 150 minutes per week if tolerated. Lipid-lowering medications: see documented in medical record.  3. Obesity, Current BMI 32.8 Patty Williams is currently in the action stage of change. As such, her goal is to continue with weight loss efforts. She has agreed to keeping a food journal and adhering to recommended goals of 1200 calories and 75+ grams protein.   Food journaling log given to pt. Extensive discussion with pt regarding how foods that she eats affects her medical  conditions.  Exercise goals:  As is  Behavioral modification strategies: increasing lean protein intake, decreasing simple carbohydrates, meal planning and cooking strategies, travel eating strategies, and keeping a strict food journal.  Patty Williams has agreed to follow-up with our clinic in 4 weeks, with Dr. Leafy Ro. She was informed of the importance of frequent follow-up visits to maximize her success with intensive lifestyle modifications for her multiple health conditions.   Objective:   Blood pressure 115/77, pulse 63, temperature 97.8 F (36.6 C), height '5\' 5"'$  (1.651 m), weight 197 lb (89.4 kg), last menstrual period 10/08/2018, SpO2 98 %. Body mass index is 32.78 kg/m.  General: Cooperative, alert, well developed, in no acute distress. HEENT: Conjunctivae and lids unremarkable. Cardiovascular: Regular rhythm.  Lungs: Normal work of breathing. Neurologic: No focal deficits.   Lab Results  Component Value Date   CREATININE 0.61 12/03/2021   BUN 15 12/03/2021   NA 142 12/03/2021   K 4.5 12/03/2021   CL 103 12/03/2021   CO2 26 12/03/2021   Lab Results  Component Value Date   ALT 28 12/03/2021   AST 21 12/03/2021   ALKPHOS 88 12/03/2021   BILITOT 0.4 12/03/2021   Lab Results  Component Value Date   HGBA1C 5.7 (H) 12/03/2021   HGBA1C 5.9 (H) 07/08/2021   HGBA1C 6.4 02/11/2021   HGBA1C 6.2 (H) 11/06/2020   HGBA1C 6.4 05/31/2020   Lab Results  Component Value Date   INSULIN 19.5 12/03/2021   INSULIN 13.2 07/08/2021   INSULIN 22.6 11/06/2020   INSULIN 19.9 06/27/2020   Lab Results  Component Value Date   TSH 1.160 12/15/2021   Lab Results  Component Value Date   CHOL 204 (H) 12/03/2021   HDL 48 12/03/2021   LDLCALC 138 (H) 12/03/2021   LDLDIRECT 144.2 09/21/2008   TRIG 99 12/03/2021   CHOLHDL 4 02/11/2021   Lab Results  Component Value Date   VD25OH 51.9 12/03/2021   VD25OH 54.9 07/08/2021   VD25OH 95.8 11/06/2020   Lab Results  Component Value Date    WBC 5.7 06/27/2020   HGB 13.0 06/27/2020   HCT 39.0 06/27/2020   MCV 87 06/27/2020   PLT 268 06/27/2020   Lab Results  Component Value Date   IRON 62 11/09/2008   FERRITIN 3.9 (L) 11/09/2008   Attestation Statements:   Reviewed by clinician on day of visit: allergies, medications, problem list, medical history, surgical history, family history, social history, and previous encounter notes.  Time spent on visit including pre-visit chart review and post-visit care and charting was 40 minutes.   I, Kathlene November, BS, CMA, am acting as transcriptionist for [employer], DO.   I have reviewed the above documentation for accuracy and completeness, and I agree with the above. Marjory Sneddon, D.O.  The [place] was signed into law in 2016 which includes the topic of electronic health records.  This provides immediate access to information in MyChart.  This includes consultation notes, operative notes, office notes,  lab results and pathology reports.  If you have any questions about what you read please let us know at your next visit so we can discuss your concerns and take corrective action if need be.  We are right here with you.

## 2022-01-14 ENCOUNTER — Ambulatory Visit (INDEPENDENT_AMBULATORY_CARE_PROVIDER_SITE_OTHER): Payer: BC Managed Care – PPO | Admitting: Family Medicine

## 2022-01-23 DIAGNOSIS — M4316 Spondylolisthesis, lumbar region: Secondary | ICD-10-CM | POA: Diagnosis not present

## 2022-01-23 DIAGNOSIS — M48062 Spinal stenosis, lumbar region with neurogenic claudication: Secondary | ICD-10-CM | POA: Diagnosis not present

## 2022-01-28 ENCOUNTER — Other Ambulatory Visit: Payer: Self-pay | Admitting: Family Medicine

## 2022-02-02 ENCOUNTER — Encounter (INDEPENDENT_AMBULATORY_CARE_PROVIDER_SITE_OTHER): Payer: Self-pay | Admitting: Family Medicine

## 2022-02-02 ENCOUNTER — Ambulatory Visit (INDEPENDENT_AMBULATORY_CARE_PROVIDER_SITE_OTHER): Payer: BC Managed Care – PPO | Admitting: Family Medicine

## 2022-02-02 VITALS — BP 118/78 | HR 82 | Temp 98.3°F | Ht 65.0 in | Wt 197.0 lb

## 2022-02-02 DIAGNOSIS — E1169 Type 2 diabetes mellitus with other specified complication: Secondary | ICD-10-CM

## 2022-02-02 DIAGNOSIS — E669 Obesity, unspecified: Secondary | ICD-10-CM

## 2022-02-02 DIAGNOSIS — I10 Essential (primary) hypertension: Secondary | ICD-10-CM | POA: Diagnosis not present

## 2022-02-02 DIAGNOSIS — Z7985 Long-term (current) use of injectable non-insulin antidiabetic drugs: Secondary | ICD-10-CM

## 2022-02-02 DIAGNOSIS — Z6832 Body mass index (BMI) 32.0-32.9, adult: Secondary | ICD-10-CM

## 2022-02-02 MED ORDER — SEMAGLUTIDE (1 MG/DOSE) 4 MG/3ML ~~LOC~~ SOPN: 1.0000 mg | PEN_INJECTOR | SUBCUTANEOUS | 0 refills | Status: DC

## 2022-02-04 NOTE — Progress Notes (Unsigned)
Chief Complaint:   OBESITY Patty Williams is here to discuss her progress with her obesity treatment plan along with follow-up of her obesity related diagnoses. Patty Williams is on {MWMwtlossportion/plan2:23431} and states she is following her eating plan approximately ***% of the time. Patty Williams states she is *** *** minutes *** times per week.  Today's visit was #: *** Starting weight: *** Starting date: *** Today's weight: *** Today's date: 02/02/2022 Total lbs lost to date: *** Total lbs lost since last in-office visit: ***  Interim History: ***  Subjective:   1. Type 2 diabetes mellitus with other specified complication, unspecified whether long term insulin use (HCC) ***  2. Essential hypertension ***  Assessment/Plan:   1. Type 2 diabetes mellitus with other specified complication, unspecified whether long term insulin use (HCC) *** - Semaglutide, 1 MG/DOSE, 4 MG/3ML SOPN; Inject 1 mg as directed once a week.  Dispense: 3 mL; Refill: 0  2. Essential hypertension ***  3. Obesity, Current BMI 32.9 Patty Williams {CHL AMB IS/IS NOT:210130109} currently in the action stage of change. As such, her goal is to {MWMwtloss#1:210800005}. She has agreed to {MWMwtlossportion/plan2:23431}.   Exercise goals: {MWM EXERCISE RECS:23473}  Behavioral modification strategies: {MWMwtlossdietstrategies3:23432}.  Patty Williams has agreed to follow-up with our clinic in {NUMBER 1-10:22536} weeks. She was informed of the importance of frequent follow-up visits to maximize her success with intensive lifestyle modifications for her multiple health conditions.   Objective:   Blood pressure 118/78, pulse 82, temperature 98.3 F (36.8 C), height '5\' 5"'$  (1.651 m), weight 197 lb (89.4 kg), last menstrual period 10/08/2018, SpO2 97 %. Body mass index is 32.78 kg/m.  General: Cooperative, alert, well developed, in no acute distress. HEENT: Conjunctivae and lids unremarkable. Cardiovascular: Regular rhythm.  Lungs: Normal work of  breathing. Neurologic: No focal deficits.   Lab Results  Component Value Date   CREATININE 0.61 12/03/2021   BUN 15 12/03/2021   NA 142 12/03/2021   K 4.5 12/03/2021   CL 103 12/03/2021   CO2 26 12/03/2021   Lab Results  Component Value Date   ALT 28 12/03/2021   AST 21 12/03/2021   ALKPHOS 88 12/03/2021   BILITOT 0.4 12/03/2021   Lab Results  Component Value Date   HGBA1C 5.7 (H) 12/03/2021   HGBA1C 5.9 (H) 07/08/2021   HGBA1C 6.4 02/11/2021   HGBA1C 6.2 (H) 11/06/2020   HGBA1C 6.4 05/31/2020   Lab Results  Component Value Date   INSULIN 19.5 12/03/2021   INSULIN 13.2 07/08/2021   INSULIN 22.6 11/06/2020   INSULIN 19.9 06/27/2020   Lab Results  Component Value Date   TSH 1.160 12/15/2021   Lab Results  Component Value Date   CHOL 204 (H) 12/03/2021   HDL 48 12/03/2021   LDLCALC 138 (H) 12/03/2021   LDLDIRECT 144.2 09/21/2008   TRIG 99 12/03/2021   CHOLHDL 4 02/11/2021   Lab Results  Component Value Date   VD25OH 51.9 12/03/2021   VD25OH 54.9 07/08/2021   VD25OH 95.8 11/06/2020   Lab Results  Component Value Date   WBC 5.7 06/27/2020   HGB 13.0 06/27/2020   HCT 39.0 06/27/2020   MCV 87 06/27/2020   PLT 268 06/27/2020   Lab Results  Component Value Date   IRON 62 11/09/2008   FERRITIN 3.9 (L) 11/09/2008   Attestation Statements:   Reviewed by clinician on day of visit: allergies, medications, problem list, medical history, surgical history, family history, social history, and previous encounter notes.   I,  Trixie Dredge, am acting as transcriptionist for Dennard Nip, MD.  I have reviewed the above documentation for accuracy and completeness, and I agree with the above. -  ***

## 2022-02-06 ENCOUNTER — Other Ambulatory Visit: Payer: BC Managed Care – PPO

## 2022-02-06 ENCOUNTER — Telehealth (INDEPENDENT_AMBULATORY_CARE_PROVIDER_SITE_OTHER): Payer: BC Managed Care – PPO | Admitting: Family Medicine

## 2022-02-06 DIAGNOSIS — E559 Vitamin D deficiency, unspecified: Secondary | ICD-10-CM | POA: Diagnosis not present

## 2022-02-06 DIAGNOSIS — I10 Essential (primary) hypertension: Secondary | ICD-10-CM

## 2022-02-06 DIAGNOSIS — E1169 Type 2 diabetes mellitus with other specified complication: Secondary | ICD-10-CM

## 2022-02-06 DIAGNOSIS — E785 Hyperlipidemia, unspecified: Secondary | ICD-10-CM

## 2022-02-06 DIAGNOSIS — Z79899 Other long term (current) drug therapy: Secondary | ICD-10-CM

## 2022-02-06 DIAGNOSIS — E1165 Type 2 diabetes mellitus with hyperglycemia: Secondary | ICD-10-CM

## 2022-02-06 DIAGNOSIS — E119 Type 2 diabetes mellitus without complications: Secondary | ICD-10-CM

## 2022-02-06 DIAGNOSIS — E7849 Other hyperlipidemia: Secondary | ICD-10-CM | POA: Diagnosis not present

## 2022-02-06 LAB — COMPREHENSIVE METABOLIC PANEL
ALT: 23 U/L (ref 0–35)
AST: 17 U/L (ref 0–37)
Albumin: 4.1 g/dL (ref 3.5–5.2)
Alkaline Phosphatase: 74 U/L (ref 39–117)
BUN: 16 mg/dL (ref 6–23)
CO2: 26 mEq/L (ref 19–32)
Calcium: 9 mg/dL (ref 8.4–10.5)
Chloride: 105 mEq/L (ref 96–112)
Creatinine, Ser: 0.62 mg/dL (ref 0.40–1.20)
GFR: 95.81 mL/min (ref >60.00)
Glucose, Bld: 94 mg/dL (ref 70–99)
Potassium: 4.3 mEq/L (ref 3.5–5.1)
Sodium: 141 mEq/L (ref 135–145)
Total Bilirubin: 0.5 mg/dL (ref 0.2–1.2)
Total Protein: 6.7 g/dL (ref 6.0–8.3)

## 2022-02-06 LAB — CBC WITH DIFFERENTIAL/PLATELET
Basophils Absolute: 0 10*3/uL (ref 0.0–0.1)
Basophils Relative: 0.6 % (ref 0.0–3.0)
Eosinophils Absolute: 0.1 10*3/uL (ref 0.0–0.7)
Eosinophils Relative: 1.2 % (ref 0.0–5.0)
HCT: 37 % (ref 36.0–46.0)
Hemoglobin: 12.4 g/dL (ref 12.0–15.0)
Lymphocytes Relative: 27.5 % (ref 12.0–46.0)
Lymphs Abs: 1.6 10*3/uL (ref 0.7–4.0)
MCHC: 33.5 g/dL (ref 30.0–36.0)
MCV: 88.6 fl (ref 78.0–100.0)
Monocytes Absolute: 0.5 10*3/uL (ref 0.1–1.0)
Monocytes Relative: 8.9 % (ref 3.0–12.0)
Neutro Abs: 3.6 10*3/uL (ref 1.4–7.7)
Neutrophils Relative %: 61.8 % (ref 43.0–77.0)
Platelets: 240 10*3/uL (ref 150.0–400.0)
RBC: 4.17 Mil/uL (ref 3.87–5.11)
RDW: 14.2 % (ref 11.5–15.5)
WBC: 5.8 10*3/uL (ref 4.0–10.5)

## 2022-02-06 LAB — LIPID PANEL
Cholesterol: 191 mg/dL (ref 0–200)
HDL: 46.1 mg/dL (ref >39.00)
LDL Cholesterol: 133 mg/dL — ABNORMAL HIGH (ref 0–99)
NonHDL: 144.94
Total CHOL/HDL Ratio: 4
Triglycerides: 62 mg/dL (ref 0.0–149.0)
VLDL: 12.4 mg/dL (ref 0.0–40.0)

## 2022-02-06 LAB — VITAMIN D 25 HYDROXY (VIT D DEFICIENCY, FRACTURES): VITD: 33.76 ng/mL (ref 30.00–100.00)

## 2022-02-06 LAB — VITAMIN B12: Vitamin B-12: 925 pg/mL — ABNORMAL HIGH (ref 211–911)

## 2022-02-06 LAB — TSH: TSH: 1.29 u[IU]/mL (ref 0.35–5.50)

## 2022-02-06 NOTE — Telephone Encounter (Signed)
Lab order

## 2022-02-09 DIAGNOSIS — F4323 Adjustment disorder with mixed anxiety and depressed mood: Secondary | ICD-10-CM | POA: Diagnosis not present

## 2022-02-13 ENCOUNTER — Encounter: Payer: BC Managed Care – PPO | Admitting: Family Medicine

## 2022-02-13 NOTE — Progress Notes (Deleted)
   Subjective:    Patient ID: Patty Williams, female    DOB: 08-03-1960, 61 y.o.   MRN: [id number]  HPI Here for health maintenance exam and to review chronic medical problems    Immunization History  Administered Date(s) Administered   Influenza Split 01/10/2012, 01/23/2013   Influenza Whole 03/02/2008, 02/21/2010   Influenza,inj,Quad PF,6+ Mos 01/13/2019, 01/19/2020   Influenza-Unspecified 01/23/2014, 01/24/2015, 01/30/2021   Moderna Sars-Covid-2 Vaccination 05/11/2019, 06/09/2019, 03/08/2020, 02/03/2021   Pneumococcal Polysaccharide-23 03/02/2008, 05/11/2011, 12/17/2017   Td 06/11/1997   Tdap 05/11/2011, 07/21/2018   Zoster Recombinat (Shingrix) 01/03/2018, 03/18/2018, 03/30/2018   Health Maintenance Due  Topic Date Due   HIV Screening  Never done   Hepatitis C Screening  Never done   OPHTHALMOLOGY EXAM  11/08/2009   Diabetic kidney evaluation - Urine ACR  08/17/2010   FOOT EXAM  08/24/2010   PAP SMEAR-Modifier  03/13/2019   COVID-19 Vaccine (5 - Moderna risk series) 03/31/2021   INFLUENZA VACCINE  12/09/2021   Eye exam  Pap 03/2016 gyn   Flu shot   Mammogram 02/2021 at [facility] Self breast exam  Bone density -had nl dexa in 2017  D level is 33.7   Colonoscopy 02/2019  7 y recall for polyp  HTN   Benicar 40 mg daily   DM2 Lab Results  Component Value Date   HGBA1C 5.7 (H) 12/03/2021   Semaglutide 1 mg weekly   On arb   On PPI- protonix Lab Results  Component Value Date   VITAMINB12 925 (H) 02/06/2022   Hyperlipidemia  Lab Results  Component Value Date   CHOL 191 02/06/2022   CHOL 204 (H) 12/03/2021   CHOL 205 (H) 07/08/2021   Lab Results  Component Value Date   HDL 46.10 02/06/2022   HDL 48 12/03/2021   HDL 45 07/08/2021   Lab Results  Component Value Date   LDLCALC 133 (H) 02/06/2022   LDLCALC 138 (H) 12/03/2021   LDLCALC 145 (H) 07/08/2021   Lab Results  Component Value Date   TRIG 62.0 02/06/2022   TRIG 99 12/03/2021   TRIG 84  07/08/2021   Lab Results  Component Value Date   CHOLHDL 4 02/06/2022   CHOLHDL 4 02/11/2021   CHOLHDL 3.7 11/06/2020   Lab Results  Component Value Date   LDLDIRECT 144.2 09/21/2008   Not on statin currently   Review of Systems     Objective:   Physical Exam        Assessment & Plan:

## 2022-02-23 ENCOUNTER — Other Ambulatory Visit: Payer: Self-pay | Admitting: Family Medicine

## 2022-02-23 DIAGNOSIS — F4323 Adjustment disorder with mixed anxiety and depressed mood: Secondary | ICD-10-CM | POA: Diagnosis not present

## 2022-03-02 ENCOUNTER — Encounter (INDEPENDENT_AMBULATORY_CARE_PROVIDER_SITE_OTHER): Payer: Self-pay | Admitting: Family Medicine

## 2022-03-02 ENCOUNTER — Ambulatory Visit (INDEPENDENT_AMBULATORY_CARE_PROVIDER_SITE_OTHER): Payer: BC Managed Care – PPO | Admitting: Family Medicine

## 2022-03-02 VITALS — BP 110/80 | HR 87 | Temp 98.4°F | Ht 65.0 in | Wt 195.0 lb

## 2022-03-02 DIAGNOSIS — E1169 Type 2 diabetes mellitus with other specified complication: Secondary | ICD-10-CM

## 2022-03-02 DIAGNOSIS — E669 Obesity, unspecified: Secondary | ICD-10-CM | POA: Diagnosis not present

## 2022-03-02 DIAGNOSIS — Z6832 Body mass index (BMI) 32.0-32.9, adult: Secondary | ICD-10-CM

## 2022-03-02 DIAGNOSIS — Z7985 Long-term (current) use of injectable non-insulin antidiabetic drugs: Secondary | ICD-10-CM

## 2022-03-02 MED ORDER — SEMAGLUTIDE (1 MG/DOSE) 4 MG/3ML ~~LOC~~ SOPN: 1.0000 mg | PEN_INJECTOR | SUBCUTANEOUS | 0 refills | Status: DC

## 2022-03-03 ENCOUNTER — Encounter: Payer: Self-pay | Admitting: Internal Medicine

## 2022-03-03 ENCOUNTER — Encounter: Payer: Self-pay | Admitting: Family Medicine

## 2022-03-03 ENCOUNTER — Ambulatory Visit (INDEPENDENT_AMBULATORY_CARE_PROVIDER_SITE_OTHER): Payer: BC Managed Care – PPO | Admitting: Family Medicine

## 2022-03-03 VITALS — BP 122/76 | HR 61 | Temp 97.7°F | Ht 65.0 in | Wt 198.5 lb

## 2022-03-03 DIAGNOSIS — E785 Hyperlipidemia, unspecified: Secondary | ICD-10-CM

## 2022-03-03 DIAGNOSIS — I10 Essential (primary) hypertension: Secondary | ICD-10-CM

## 2022-03-03 DIAGNOSIS — E6609 Other obesity due to excess calories: Secondary | ICD-10-CM

## 2022-03-03 DIAGNOSIS — Z6833 Body mass index (BMI) 33.0-33.9, adult: Secondary | ICD-10-CM

## 2022-03-03 DIAGNOSIS — K219 Gastro-esophageal reflux disease without esophagitis: Secondary | ICD-10-CM | POA: Diagnosis not present

## 2022-03-03 DIAGNOSIS — Z0000 Encounter for general adult medical examination without abnormal findings: Secondary | ICD-10-CM | POA: Diagnosis not present

## 2022-03-03 DIAGNOSIS — E559 Vitamin D deficiency, unspecified: Secondary | ICD-10-CM

## 2022-03-03 DIAGNOSIS — Z79899 Other long term (current) drug therapy: Secondary | ICD-10-CM

## 2022-03-03 DIAGNOSIS — E1169 Type 2 diabetes mellitus with other specified complication: Secondary | ICD-10-CM | POA: Diagnosis not present

## 2022-03-03 DIAGNOSIS — E119 Type 2 diabetes mellitus without complications: Secondary | ICD-10-CM

## 2022-03-03 MED ORDER — ROSUVASTATIN CALCIUM 5 MG PO TABS: 5.0000 mg | ORAL_TABLET | Freq: Every day | ORAL | 3 refills | Status: DC

## 2022-03-03 NOTE — Assessment & Plan Note (Signed)
Disc goals for lipids and reasons to control them Rev last labs with pt Rev low sat fat diet in detail Not at goal LDL 133  Agreed to try crestor 5 mg to see if tolerated  Will update if any problems  Plan fasting lab in 6 wk

## 2022-03-03 NOTE — Assessment & Plan Note (Signed)
Reviewed health habits including diet and exercise and skin cancer prevention Reviewed appropriate screening tests for age  Also reviewed health mt list, fam hx and immunization status , as well as social and family history   See HPI Labs reviewed  Gyn care is utd, sent for last pap report  Mammogram utd -planned this month  Colonoscopy utd 02/2019 Working on wt loss

## 2022-03-03 NOTE — Assessment & Plan Note (Signed)
Taking high dose ppi and still has some symptoms protonix 40 mg bid Disc long term risks/ renal and vitamin deficiencies   B12 and D reviewed Enc to re start her [place] f/u with GI to discuss  Enc further wt loss and dietary change ? If stress plays a role

## 2022-03-03 NOTE — Assessment & Plan Note (Signed)
bp in fair control at this time  BP Readings from Last 1 Encounters:  03/03/22 122/76   No changes needed Most recent labs reviewed  Disc lifstyle change with low sodium diet and exercise  Plans to keep working on wt loss and taking benicar 40 mg daily

## 2022-03-03 NOTE — Telephone Encounter (Signed)
I reviewed the download. We are getting excellent control with current CPAP setting of 5-15. Average breakthroughs only 1.5/ hour over the past month,  so unless there is a comfort concern, I would leave it where it is.

## 2022-03-03 NOTE — Assessment & Plan Note (Signed)
Well controlled  semaglutide 1 mg weekly  Monitored by the healthy wt clinic  Has eye exam planned  Starting statin (crestor) today  On arb  Enc her to keep working on wt loss and low glyemic diet and exercise

## 2022-03-03 NOTE — Telephone Encounter (Signed)
Dr. Annamaria Boots, please advise on pt's message regarding her CPAP pressure. Pts DL is in [place], nurse can print in office. Thank you!

## 2022-03-03 NOTE — Assessment & Plan Note (Signed)
D level in 69s Just got started back on D3  Vitamin D level is therapeutic with current supplementation but could be higher  Disc importance of this to bone and overall health

## 2022-03-03 NOTE — Patient Instructions (Addendum)
Start back on magnesium  Continue the vitamin D   Aim for 64 oz of fluid daily mostly water   Start 5 mg of crestor daily  Take in evening with low fat snack   Take care of yourself   Keep working on low glycemic diet   Get your fasting cholesterol profile in 6 weeks

## 2022-03-03 NOTE — Progress Notes (Signed)
Subjective:    Patient ID: Patty Williams, female    DOB: 1960-07-26, 61 y.o.   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   Accupuncture for back helps    Immunization History  Administered Date(s) Administered   Influenza Split 01/10/2012, 01/23/2013   Influenza Whole 03/02/2008, 02/21/2010   Influenza,inj,Quad PF,6+ Mos 01/13/2019, 01/19/2020   Influenza-Unspecified 01/23/2014, 01/24/2015, 01/30/2021, 01/30/2022   Moderna Sars-Covid-2 Vaccination 05/11/2019, 06/09/2019, 03/08/2020, 02/03/2021   Pneumococcal Polysaccharide-23 03/02/2008, 05/11/2011, 12/17/2017   Td 06/11/1997   Tdap 05/11/2011, 07/21/2018   Zoster Recombinat (Shingrix) 01/03/2018, 03/18/2018, 03/30/2018   Health Maintenance Due  Topic Date Due   HIV Screening  Never done   Hepatitis C Screening  Never done   OPHTHALMOLOGY EXAM  11/08/2009   Diabetic kidney evaluation - Urine ACR  08/17/2010   FOOT EXAM  08/24/2010   PAP SMEAR-Modifier  03/13/2019   COVID-19 Vaccine (5 - Moderna risk series) 03/31/2021   Gyn visit/pap Last visit was in May Dr Valentino Saxon  Does a pap every 3 years   Mammogram 02/2021 Self breast exam: no lumps   Colonoscopy 02/2019  HTN bp is stable today  No cp or palpitations or headaches or edema  No side effects to medicines  BP Readings from Last 3 Encounters:  03/03/22 122/76  03/02/22 110/80  02/02/22 118/78    Pulse Readings from Last 3 Encounters:  03/03/22 61  03/02/22 87  02/02/22 82   Benicar 40  mg daily   OSA Cpap   GERD Lab Results  Component Value Date   VITAMINB12 925 (H) 02/06/2022   Vit D level  33.7 - did resume her D recently   Pantoprazole 40 mg bid  Still not controlled   Not compliant with magnesium/ some cramps    DM2 Lab Results  Component Value Date   HGBA1C 5.7 (H) 12/03/2021    Very good control  Semaglutide 1 mg weekly  On ARB   Eye exam :  Is ready for blephoroplasty as well as regular exam and has it scheduled      Hyperlipidemia  Lab Results  Component Value Date   CHOL 191 02/06/2022   CHOL 204 (H) 12/03/2021   CHOL 205 (H) 07/08/2021   Lab Results  Component Value Date   HDL 46.10 02/06/2022   HDL 48 12/03/2021   HDL 45 07/08/2021   Lab Results  Component Value Date   LDLCALC 133 (H) 02/06/2022   LDLCALC 138 (H) 12/03/2021   LDLCALC 145 (H) 07/08/2021   Lab Results  Component Value Date   TRIG 62.0 02/06/2022   TRIG 99 12/03/2021   TRIG 84 07/08/2021   Lab Results  Component Value Date   CHOLHDL 4 02/06/2022   CHOLHDL 4 02/11/2021   CHOLHDL 3.7 11/06/2020   Lab Results  Component Value Date   LDLDIRECT 144.2 09/21/2008   The 10-year ASCVD risk score (Arnett DK,  et al., 2019) is: 9.1%   Values used to calculate the score:     Age: 31 years     Sex: Female     Is Non-Hispanic African American: No     Diabetic: Yes     Tobacco smoker: No     Systolic Blood Pressure: 638 mmHg     Is BP treated: Yes     HDL Cholesterol: 46.1 mg/dL     Total Cholesterol: 191 mg/dL  Dexa was nl in 2017   Patient Active Problem List   Diagnosis Date Noted   Hyperlipidemia associated with type 2 diabetes mellitus ([place]) 01/05/2022   Heat intolerance 12/15/2021   Type 2 diabetes mellitus with hyperglycemia, without long-term current use of insulin ([place]) 12/15/2021   Type 2 diabetes mellitus with other specified complication ([place]) 75/64/3329   Lumbar disc disease 02/11/2021   Other fatigue 06/27/2020   Vitamin D deficiency  06/27/2020   At risk for heart disease 06/27/2020   Intermittent diarrhea 03/15/2020   Hx of adenomatous polyp of colon 03/24/2019   Family history of pulmonary fibrosis 12/23/2018   Family history of kidney disease 12/23/2018   Colon cancer screening 12/23/2018   Vaginal bleeding 09/22/2018   Current use of proton pump inhibitor 10/18/2015   History of fracture 10/18/2015   Left knee pain 04/19/2015   Frequent UTI 10/06/2013   Seasonal and perennial allergic rhinitis 01/01/2013   Routine general medical examination at a health care facility 04/30/2011   Controlled type 2 diabetes mellitus without complication, without long-term current use of insulin ([place])    Plantar fasciitis    METATARSALGIA 04/11/2010   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   Obstructive sleep apnea 06/10/2009   GERD 11/09/2008   Essential hypertension 03/02/2008   Class 1 obesity due to excess calories with serious comorbidity and body mass index (BMI) of 33.0 to 33.9 in adult 11/18/2007   Allergic asthma, mild intermittent, uncomplicated 51/88/4166   Endometriosis 03/29/2007   Past Medical History:  Diagnosis Date   Anxiety    Asthma    Back pain    Bilateral ovarian cysts    Bruxism    Constipation    Diabetes mellitus type II    mild   Diverticulosis 2014   Mild   Dry mouth    Elevated blood pressure reading without diagnosis of hypertension    Endometriosis    Severe   Fibroids    GERD (gastroesophageal reflux disease)    History of ovarian cyst    HLD (hyperlipidemia)    Hx of adenomatous polyp of colon 03/24/2019   Hyperglycemia    Hyperlipidemia    Hypertension    Iron deficiency anemia    Joint pain    Menorrhagia    Obesity    Obesity    OSA on CPAP    Plantar fasciitis    with orthotics-much difficulty adjusting these   PMB (postmenopausal bleeding)    Pneumonia    history of   PONV (postoperative nausea and vomiting)    Pre-diabetes    Rectovaginal fistula    Sleep  apnea    Uterine adhesion    Uterine fibroid    Wears glasses    Past Surgical History:  Procedure Laterality Date   COLONOSCOPY  01/2009   DILATATION & CURETTAGE/HYSTEROSCOPY WITH MYOSURE N/A 09/22/2018   Procedure: DILATATION & CURETTAGE/HYSTEROSCOPY;  Surgeon: Aloha Gell, MD;  Location: [facility];  Service: Gynecology;  Laterality: N/A;   HEMORRHOID SURGERY  LAPAROSCOPY     endometriosis   NASAL SEPTOPLASTY W/ TURBINOPLASTY Bilateral 09/02/2021   Procedure: NASAL SEPTOPLASTY WITH SUBMUCOSAL RESECTION OF TURBINATES;  Surgeon: Beverly Gust, MD;  Location: [facility];  Service: ENT;  Laterality: Bilateral;   TONSILLECTOMY AND ADENOIDECTOMY     TRIGGER FINGER RELEASE     UPPER GI ENDOSCOPY  01/2009   uterine biopsy     uterine cyst exploration     UTERINE SUSPENSION     WISDOM TOOTH EXTRACTION     Social History   Tobacco Use   Smoking status: Never   Smokeless tobacco: Never  Vaping Use   Vaping Use: Never used  Substance Use Topics   Alcohol use: Yes    Comment: occasionally   Drug use: No   Family History  Problem Relation Age of Onset   Thyroid disease Mother        ?   Diabetes Mother    Hypertension Mother    Hyperlipidemia Mother    Anxiety disorder Mother    Sleep apnea Mother    Obesity Mother    Cancer Father        prostate   Hypertension Father    Obesity Father    Prostate cancer Father    Heart attack Other        grandfather (in his 75's)   Parkinsonism Other        grandfater   Stomach cancer Neg Hx    Colon cancer Neg Hx    Esophageal cancer Neg Hx    No Known Allergies Current Outpatient Medications on File Prior to Visit  Medication Sig Dispense Refill   albuterol (VENTOLIN HFA) 108 (90 Base) MCG/ACT inhaler Inhale 2 puffs into the lungs every 4 (four) hours as needed. 8.5 g 1   budesonide-formoterol (SYMBICORT) 80-4.5 MCG/ACT inhaler USE 2 INHALATIONS TWICE A DAY 10.2 g 0   docusate sodium (COLACE) 100 MG  capsule Take 100 mg by mouth daily as needed for mild constipation.     magnesium oxide (MAG-OX) 400 MG tablet Take by mouth.     Multiple Vitamin (MULTIVITAMIN) tablet Take 1 tablet by mouth daily.     naproxen (NAPROSYN) 500 MG tablet Take 1 tablet (500 mg total) by mouth 2 (two) times daily as needed for moderate pain. With a meal 60 tablet 3   olmesartan (BENICAR) 40 MG tablet Take 1 tablet (40 mg total) by mouth daily. 90 tablet 2   Omega-3 Fatty Acids (FISH OIL) 1000 MG CAPS Take 2,000 mg by mouth daily.     pantoprazole (PROTONIX) 40 MG tablet Take 1 tablet (40 mg total) by mouth 2 (two) times daily before a meal. Breakfast and supper 60 tablet 11   Semaglutide, 1 MG/DOSE, 4 MG/3ML SOPN Inject 1 mg as directed once a week. 3 mL 0   TURMERIC PO Take by mouth.     No current facility-administered medications on file prior to visit.    Review of Systems  Constitutional:  Negative for activity change, appetite change, fatigue, fever and unexpected weight change.  HENT:  Negative for congestion, ear pain, rhinorrhea, sinus pressure and sore throat.   Eyes:  Negative for pain, redness and visual disturbance.  Respiratory:  Negative for cough, shortness of breath and wheezing.   Cardiovascular:  Negative for chest pain and palpitations.  Gastrointestinal:  Negative for abdominal pain, blood in stool, constipation and diarrhea.  Endocrine: Negative for polydipsia and polyuria.  Genitourinary:  Negative for dysuria, frequency  and urgency.  Musculoskeletal:  Positive for arthralgias and back pain. Negative for myalgias.       Occ muscle cramps   Skin:  Negative for pallor and rash.  Allergic/Immunologic: Negative for environmental allergies.  Neurological:  Negative for dizziness, syncope and headaches.  Hematological:  Negative for adenopathy. Does not bruise/bleed easily.  Psychiatric/Behavioral:  Negative for decreased concentration and dysphoric mood. The patient is nervous/anxious.         Stressors        Objective:   Physical Exam Constitutional:      General: She is not in acute distress.    Appearance: Normal appearance. She is well-developed. She is obese. She is not ill-appearing or diaphoretic.  HENT:     Head: Normocephalic and atraumatic.     Right Ear: Tympanic membrane, ear canal and external ear normal.     Left Ear: Tympanic membrane, ear canal and external ear normal.     Nose: Nose normal. No congestion.     Mouth/Throat:     Mouth: Mucous membranes are moist.     Pharynx: Oropharynx is clear. No posterior oropharyngeal erythema.  Eyes:     General: No scleral icterus.    Extraocular Movements: Extraocular movements intact.     Conjunctiva/sclera: Conjunctivae normal.     Pupils: Pupils are equal, round, and reactive to light.  Neck:     Thyroid: No thyromegaly.     Vascular: No carotid bruit or JVD.  Cardiovascular:     Rate and Rhythm: Normal rate and regular rhythm.     Pulses: Normal pulses.     Heart sounds: Normal heart sounds.     No gallop.  Pulmonary:     Effort: Pulmonary effort is normal. No respiratory distress.     Breath sounds: Normal breath sounds. No wheezing.     Comments: Good air exch Chest:     Chest wall: No tenderness.  Abdominal:     General: Bowel sounds are normal. There is no distension or abdominal bruit.     Palpations: Abdomen is soft. There is no mass.     Tenderness: There is no abdominal tenderness.     Hernia: No hernia is present.  Genitourinary:    Comments: Breast and pelvic exams are done by gyn Musculoskeletal:        General: No tenderness. Normal range of motion.     Cervical back: Normal range of motion and neck supple. No rigidity. No muscular tenderness.     Right lower leg: No edema.     Left lower leg: No edema.     Comments: No kyphosis   Lymphadenopathy:     Cervical: No cervical adenopathy.  Skin:    General: Skin is warm and dry.     Coloration: Skin is not pale.     Findings: No  erythema or rash.     Comments: Solar lentigines diffusely   Neurological:     Mental Status: She is alert. Mental status is at baseline.     Cranial Nerves: No cranial nerve deficit.     Motor: No abnormal muscle tone.     Coordination: Coordination normal.     Gait: Gait normal.     Deep Tendon Reflexes: Reflexes are normal and symmetric. Reflexes normal.  Psychiatric:        Mood and Affect: Mood normal.        Cognition and Memory: Cognition and memory normal.  Assessment & Plan:   Problem List Items Addressed This Visit       Cardiovascular and Mediastinum   Essential hypertension    bp in fair control at this time  BP Readings from Last 1 Encounters:  03/03/22 122/76  No changes needed Most recent labs reviewed  Disc lifstyle change with low sodium diet and exercise  Plans to keep working on wt loss and taking benicar 40 mg daily       Relevant Medications   rosuvastatin (CRESTOR) 5 MG tablet     Digestive   GERD    Taking high dose ppi and still has some symptoms protonix 40 mg bid Disc long term risks/ renal and vitamin deficiencies   B12 and D reviewed Enc to re start her mag   Planning f/u with GI to discuss  Enc further wt loss and dietary change ? If stress plays a role         Endocrine   Controlled type 2 diabetes mellitus without complication, without long-term current use of insulin (HCC)    Well controlled  semaglutide 1 mg weekly  Monitored by the healthy wt clinic  Has eye exam planned  Starting statin (crestor) today  On arb  Enc her to keep working on wt loss and low glyemic diet and exercise       Relevant Medications   rosuvastatin (CRESTOR) 5 MG tablet   Hyperlipidemia associated with type 2 diabetes mellitus (HCC)    Disc goals for lipids and reasons to control them Rev last labs with pt Rev low sat fat diet in detail Not at goal LDL 133  Agreed to try crestor 5 mg to see if tolerated  Will update if any  problems  Plan fasting lab in 6 wk      Relevant Medications   rosuvastatin (CRESTOR) 5 MG tablet   Other Relevant Orders   ALT   AST   Lipid panel     Other   Class 1 obesity due to excess calories with serious comorbidity and body mass index (BMI) of 33.0 to 33.9 in adult    Discussed how this problem influences overall health and the risks it imposes  Reviewed plan for weight loss with lower calorie diet (via better food choices and also portion control or program like weight watchers) and exercise building up to or more than 30 minutes 5 days per week including some aerobic activity   Continues to work with counselor for emotional eating  Continues to go to the healthy wt clinic       Current use of proton pump inhibitor   Routine general medical examination at a health care facility - Primary    Reviewed health habits including diet and exercise and skin cancer prevention Reviewed appropriate screening tests for age  Also reviewed health mt list, fam hx and immunization status , as well as social and family history   See HPI Labs reviewed  Gyn care is utd, sent for last pap report  Mammogram utd -planned this month  Colonoscopy utd 02/2019 Working on wt loss       Vitamin D deficiency    D level in 51s Just got started back on D3  Vitamin D level is therapeutic with current supplementation but could be higher  Disc importance of this to bone and overall health

## 2022-03-03 NOTE — Assessment & Plan Note (Signed)
Discussed how this problem influences overall health and the risks it imposes  Reviewed plan for weight loss with lower calorie diet (via better food choices and also portion control or program like weight watchers) and exercise building up to or more than 30 minutes 5 days per week including some aerobic activity   Continues to work with counselor for emotional eating  Continues to go to the healthy wt clinic

## 2022-03-09 DIAGNOSIS — F4323 Adjustment disorder with mixed anxiety and depressed mood: Secondary | ICD-10-CM | POA: Diagnosis not present

## 2022-03-16 NOTE — Progress Notes (Unsigned)
Chief Complaint:   OBESITY Patty Williams is here to discuss her progress with her obesity treatment plan along with follow-up of her obesity related diagnoses. Patty Williams is on keeping a food journal and adhering to recommended goals of 1200 calories and 90 grams of protein and states she is following her eating plan approximately 25-50% of the time. Patty Williams states she is walking for 20 minutes 3 times per week.  Today's visit was #: 23 Starting weight: 229 lbs Starting date: 06/27/2020 Today's weight: 195 lbs Today's date: 03/02/2022 Total lbs lost to date: 34 Total lbs lost since last in-office visit: 2  Interim History: Patient still notes difficulty finding time for breakfast and lunch.  She has no issues with her meal plan.  She does not know what to cook and how to food prep.  Subjective:   1. Type 2 diabetes mellitus with other specified complication, unspecified whether long term insulin use (HCC) Medications reviewed. Diabetic ROS: no polyuria or polydipsia, no chest pain, dyspnea or TIA's, no numbness, tingling or pain in extremities.  Assessment/Plan:  No orders of the defined types were placed in this encounter.   Medications Discontinued During This Encounter  Medication Reason   Semaglutide, 1 MG/DOSE, 4 MG/3ML SOPN Reorder     Meds ordered this encounter  Medications   Semaglutide, 1 MG/DOSE, 4 MG/3ML SOPN    Sig: Inject 1 mg as directed once a week.    Dispense:  3 mL    Refill:  0     1. Type 2 diabetes mellitus with other specified complication, unspecified whether long term insulin use ([place]) Patient will continue Ozempic 1 mg once weekly, and we will refill for 1 month.  - Semaglutide, 1 MG/DOSE, 4 MG/3ML SOPN; Inject 1 mg as directed once a week.  Dispense: 3 mL; Refill: 0  2. Obesity, Current BMI 32.5 Patty Williams is currently in the action stage of change. As such, her goal is to continue with weight loss efforts. She has agreed to keeping a food journal and adhering to  recommended goals of 1200 calories and 90 grams of protein.   I had a long discussion with the patient that her goal is to have her BMI under 30, and all health paramedics to be in a "healthy" range.  Also, we had a long discussion regarding meal and food prep, and ways to cook and season her food.  Seasoning packet was discussed and handout was given.  Exercise goals: For substantial health benefits, adults should do at least 150 minutes (2 hours and 30 minutes) a week of moderate-intensity, or 75 minutes (1 hour and 15 minutes) a week of vigorous-intensity aerobic physical activity, or an equivalent combination of moderate- and vigorous-intensity aerobic activity. Aerobic activity should be performed in episodes of at least 10 minutes, and preferably, it should be spread throughout the week.  Behavioral modification strategies: increasing lean protein intake, decreasing simple carbohydrates, and no skipping meals.  Patty Williams has agreed to follow-up with our clinic in 4 weeks. She was informed of the importance of frequent follow-up visits to maximize her success with intensive lifestyle modifications for her multiple health conditions.   Objective:   Blood pressure 110/80, pulse 87, temperature 98.4 F (36.9 C), height '5\' 5"'$  (1.651 m), weight 195 lb (88.5 kg), last menstrual period 10/08/2018, SpO2 97 %. Body mass index is 32.45 kg/m.  General: Cooperative, alert, well developed, in no acute distress. HEENT: Conjunctivae and lids unremarkable. Cardiovascular: Regular rhythm.  Lungs:  Normal work of breathing. Neurologic: No focal deficits.   Lab Results  Component Value Date   CREATININE 0.62 02/06/2022   BUN 16 02/06/2022   NA 141 02/06/2022   K 4.3 02/06/2022   CL 105 02/06/2022   CO2 26 02/06/2022   Lab Results  Component Value Date   ALT 23 02/06/2022   AST 17 02/06/2022   ALKPHOS 74 02/06/2022   BILITOT 0.5 02/06/2022   Lab Results  Component Value Date   HGBA1C 5.7 (H)  12/03/2021   HGBA1C 5.9 (H) 07/08/2021   HGBA1C 6.4 02/11/2021   HGBA1C 6.2 (H) 11/06/2020   HGBA1C 6.4 05/31/2020   Lab Results  Component Value Date   INSULIN 19.5 12/03/2021   INSULIN 13.2 07/08/2021   INSULIN 22.6 11/06/2020   INSULIN 19.9 06/27/2020   Lab Results  Component Value Date   TSH 1.29 02/06/2022   Lab Results  Component Value Date   CHOL 191 02/06/2022   HDL 46.10 02/06/2022   LDLCALC 133 (H) 02/06/2022   LDLDIRECT 144.2 09/21/2008   TRIG 62.0 02/06/2022   CHOLHDL 4 02/06/2022   Lab Results  Component Value Date   VD25OH 33.76 02/06/2022   VD25OH 51.9 12/03/2021   VD25OH 54.9 07/08/2021   Lab Results  Component Value Date   WBC 5.8 02/06/2022   HGB 12.4 02/06/2022   HCT 37.0 02/06/2022   MCV 88.6 02/06/2022   PLT 240.0 02/06/2022   Lab Results  Component Value Date   IRON 62 11/09/2008   FERRITIN 3.9 (L) 11/09/2008   Attestation Statements:   Reviewed by clinician on day of visit: allergies, medications, problem list, medical history, surgical history, family history, social history, and previous encounter notes.   Wilhemena Durie, am acting as transcriptionist for [employer], DO.   I have reviewed the above documentation for accuracy and completeness, and I agree with the above. Marjory Sneddon, D.O.  The [place] was signed into law in 2016 which includes the topic of electronic health records.  This provides immediate access to information in MyChart.  This includes consultation notes, operative notes, office notes, lab results and pathology reports.  If you have any questions about what you read please let us know at your next visit so we can discuss your concerns and take corrective action if need be.  We are right here with you.

## 2022-03-18 DIAGNOSIS — G4733 Obstructive sleep apnea (adult) (pediatric): Secondary | ICD-10-CM | POA: Diagnosis not present

## 2022-03-18 NOTE — Progress Notes (Signed)
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 NPSG 08/05/21- AHI 41.3/ hr, desaturation to 85%, CPAP to 16, body weight 199 lbs -Symbicort 80, CPAP auto 8-14/ [facility]                AirSense 10 AutoSet   Download- compliance  Body weight today-   Covid vax-4 Moderna                                   Flu vax-had We reviewed recent sleep study.  She had uncomfortable aerophagia with pressure around 18 before but is willing to try range 8-16.  She would like to change DME company as discussed and we will switch her to [employer]. Pending septoplasty. She was given another copy of her FAA forms.  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 -Symbicort 80, CPAP auto 5-15/ [facility]                AirSense 10 AutoSet   Download- compliance  100%, AHI 1.8/ hr Body weight today-  199 lbs Covid vax-4 Moderna                                   Flu vax-had CPAP download is reviewed, documenting excellent use and control over the past 12 months.  She has a travel machine which she has used at times in the past. Had septoplasty in April and had to be off CPAP briefly until that healed.  Now back to full unrestricted use of CPAP. Asthma control has been excellent with no significant exacerbation in years.  By current nomenclature she would be considered mild, intermittent, uncomplicated.  Realistically, asthma  is in remission, possibly triggered very occasionally by viral respiratory infection.  She never needs her rescue inhaler.  Uses 1 puff of maintenance Symbicort daily to make sure there is no problem, but I do not really think she needs it.  PFT is pending and will be addended to this note.  PFT 03/27/22 (addendum)- Within Normal Limits FEV1  2.63/101%, FVC  3.05/90%, FEV1/FVC ratio 0.86/110% Insignificant change with bronchodilator.  Prior to Admission medications   Medication Sig Start Date End Date Taking? Authorizing Provider  albuterol (VENTOLIN HFA) 108 (90 Base) MCG/ACT inhaler Inhale 2 puffs into the lungs every 4 (four) hours as needed. 01/28/22  Yes Tower, Wynelle Fanny, MD  docusate sodium (COLACE) 100 MG capsule Take 100 mg by mouth daily as needed for mild constipation.   Yes [provider]  magnesium oxide (MAG-OX) 400 MG tablet Take by mouth.   Yes [provider]  Multiple Vitamin (MULTIVITAMIN) tablet Take 1 tablet by mouth daily.   Yes [provider]  naproxen (NAPROSYN) 500 MG tablet Take 1 tablet (500 mg total) by mouth 2 (two) times daily as  needed for moderate pain. With a meal 04/30/21  Yes Tower, Wynelle Fanny, MD  olmesartan (BENICAR) 40 MG tablet Take 1 tablet (40 mg total) by mouth daily. 02/23/22  Yes Tower, Wynelle Fanny, MD  Omega-3 Fatty Acids (FISH OIL) 1000 MG CAPS Take 2,000 mg by mouth daily.   Yes [provider]  pantoprazole (PROTONIX) 40 MG tablet Take 1 tablet (40 mg total) by mouth 2 (two) times daily before a meal. Breakfast and supper 11/28/21  Yes Gatha Mayer, MD  rosuvastatin (CRESTOR) 5 MG tablet Take 1 tablet (5 mg total) by mouth daily. 03/03/22  Yes Tower, Wynelle Fanny, MD  TURMERIC PO Take by mouth.   Yes [provider]  budesonide-formoterol (SYMBICORT) 80-4.5 MCG/ACT inhaler USE 2 INHALATIONS TWICE A DAY 03/24/22   Tower, Wynelle Fanny, MD  Semaglutide, 1 MG/DOSE, 4 MG/3ML SOPN Inject 1 mg as directed once a week. 03/31/22    Dennard Nip D, MD      ROS-see HPI   + = positive Constitutional:    weight loss, night sweats, fevers, chills, fatigue, lassitude. HEENT:    headaches, difficulty swallowing, tooth/dental problems, sore throat,       sneezing, itching, ear ache, nasal congestion, post nasal drip, snoring CV:    chest pain, orthopnea, PND, swelling in lower extremities, anasarca,                                   dizziness, palpitations Resp:   +shortness of breath with exertion or at rest.                productive cough,   non-productive cough, coughing up of blood.              change in color of mucus.  wheezing.   Skin:    rash or lesions. GI:    +heartburn, indigestion, abdominal pain, nausea, vomiting, diarrhea,                 change in bowel habits, loss of appetite GU: dysuria, change in color of urine, no urgency or frequency,  flank pain. MS:   joint pain, stiffness, decreased range of motion, back pain. Neuro-     nothing unusual Psych:  change in mood or affect.  depression or anxiety.   memory loss.  OBJ- Physical Exam General- Alert, Oriented, Affect-appropriate, Distress- none acute, + overweight Skin- rash-none, lesions- none, excoriation- none Lymphadenopathy- none Head- atraumatic            Eyes- Gross vision intact, PERRLA, conjunctivae and secretions clear            Ears- Hearing, canals-normal            Nose- Clear, no-Septal dev, mucus, polyps, erosion, perforation             Throat- Mallampati II-III , mucosa clear , drainage- none, tonsils- atrophic Neck- flexible , trachea midline, no stridor , thyroid nl, carotid no bruit Chest - symmetrical excursion , unlabored           Heart/CV- RRR , no murmur , no gallop  , no rub, nl s1 s2                           - JVD- none , edema- none, stasis changes- none, varices- none  Lung- clear to P&A- no crackles, wheeze- none, cough- none , dullness-none, rub- none           Chest wall-  Abd-  Br/ Gen/ Rectal- Not  done, not indicated Extrem- cyanosis- none, clubbing, none, atrophy- none, strength- nl Neuro- grossly intact to observation

## 2022-03-19 ENCOUNTER — Encounter: Payer: Self-pay | Admitting: Internal Medicine

## 2022-03-19 ENCOUNTER — Ambulatory Visit (INDEPENDENT_AMBULATORY_CARE_PROVIDER_SITE_OTHER): Payer: BC Managed Care – PPO | Admitting: Internal Medicine

## 2022-03-19 VITALS — BP 118/80 | HR 72 | Ht 65.0 in | Wt 199.6 lb

## 2022-03-19 DIAGNOSIS — J4520 Mild intermittent asthma, uncomplicated: Secondary | ICD-10-CM | POA: Diagnosis not present

## 2022-03-19 DIAGNOSIS — G4733 Obstructive sleep apnea (adult) (pediatric): Secondary | ICD-10-CM | POA: Diagnosis not present

## 2022-03-19 NOTE — Patient Instructions (Signed)
Order- schedule PFT (priority, ASAP)  dx history of asthma mild intermittent uncomplicated  I will get together the info you need. Please call or My Chart as needed

## 2022-03-23 ENCOUNTER — Other Ambulatory Visit: Payer: Self-pay | Admitting: Family Medicine

## 2022-03-23 DIAGNOSIS — F4323 Adjustment disorder with mixed anxiety and depressed mood: Secondary | ICD-10-CM | POA: Diagnosis not present

## 2022-03-26 DIAGNOSIS — F4323 Adjustment disorder with mixed anxiety and depressed mood: Secondary | ICD-10-CM | POA: Diagnosis not present

## 2022-03-27 ENCOUNTER — Ambulatory Visit (INDEPENDENT_AMBULATORY_CARE_PROVIDER_SITE_OTHER): Payer: BC Managed Care – PPO | Admitting: Internal Medicine

## 2022-03-27 DIAGNOSIS — J4520 Mild intermittent asthma, uncomplicated: Secondary | ICD-10-CM | POA: Diagnosis not present

## 2022-03-27 DIAGNOSIS — Z1231 Encounter for screening mammogram for malignant neoplasm of breast: Secondary | ICD-10-CM | POA: Diagnosis not present

## 2022-03-27 LAB — PULMONARY FUNCTION TEST
DL/VA % pred: 98 %
DL/VA: 4.1 ml/min/mmHg/L
DLCO cor % pred: 88 %
DLCO cor: 18.43 ml/min/mmHg
DLCO unc % pred: 88 %
DLCO unc: 18.43 ml/min/mmHg
FEF 25-75 Post: 3.34 L/sec
FEF 25-75 Pre: 3.27 L/sec
FEF2575-%Change-Post: 2 %
FEF2575-%Pred-Post: 143 %
FEF2575-%Pred-Pre: 140 %
FEV1-%Change-Post: 0 %
FEV1-%Pred-Post: 101 %
FEV1-%Pred-Pre: 101 %
FEV1-Post: 2.64 L
FEV1-Pre: 2.63 L
FEV1FVC-%Change-Post: 3 %
FEV1FVC-%Pred-Pre: 110 %
FEV6-%Change-Post: -3 %
FEV6-%Pred-Post: 90 %
FEV6-%Pred-Pre: 93 %
FEV6-Post: 2.95 L
FEV6-Pre: 3.05 L
FEV6FVC-%Pred-Post: 103 %
FEV6FVC-%Pred-Pre: 103 %
FVC-%Change-Post: -3 %
FVC-%Pred-Post: 87 %
FVC-%Pred-Pre: 90 %
FVC-Post: 2.95 L
FVC-Pre: 3.05 L
Post FEV1/FVC ratio: 89 %
Post FEV6/FVC ratio: 100 %
Pre FEV1/FVC ratio: 86 %
Pre FEV6/FVC Ratio: 100 %
RV % pred: 84 %
RV: 1.75 L
TLC % pred: 94 %
TLC: 4.94 L

## 2022-03-27 LAB — HM MAMMOGRAPHY

## 2022-03-27 NOTE — Patient Instructions (Signed)
Full PFT Performed Today  

## 2022-03-27 NOTE — Progress Notes (Signed)
Full PFT Performed Today  

## 2022-03-31 ENCOUNTER — Telehealth (INDEPENDENT_AMBULATORY_CARE_PROVIDER_SITE_OTHER): Payer: BC Managed Care – PPO | Admitting: Family Medicine

## 2022-03-31 ENCOUNTER — Encounter (INDEPENDENT_AMBULATORY_CARE_PROVIDER_SITE_OTHER): Payer: Self-pay | Admitting: Family Medicine

## 2022-03-31 DIAGNOSIS — Z6832 Body mass index (BMI) 32.0-32.9, adult: Secondary | ICD-10-CM

## 2022-03-31 DIAGNOSIS — I10 Essential (primary) hypertension: Secondary | ICD-10-CM | POA: Diagnosis not present

## 2022-03-31 DIAGNOSIS — E559 Vitamin D deficiency, unspecified: Secondary | ICD-10-CM | POA: Diagnosis not present

## 2022-03-31 DIAGNOSIS — E7849 Other hyperlipidemia: Secondary | ICD-10-CM

## 2022-03-31 DIAGNOSIS — E669 Obesity, unspecified: Secondary | ICD-10-CM

## 2022-03-31 DIAGNOSIS — E1169 Type 2 diabetes mellitus with other specified complication: Secondary | ICD-10-CM

## 2022-03-31 MED ORDER — SEMAGLUTIDE (1 MG/DOSE) 4 MG/3ML ~~LOC~~ SOPN: 1.0000 mg | PEN_INJECTOR | SUBCUTANEOUS | 0 refills | Status: DC

## 2022-04-06 DIAGNOSIS — F4323 Adjustment disorder with mixed anxiety and depressed mood: Secondary | ICD-10-CM | POA: Diagnosis not present

## 2022-04-06 DIAGNOSIS — E1169 Type 2 diabetes mellitus with other specified complication: Secondary | ICD-10-CM | POA: Diagnosis not present

## 2022-04-07 ENCOUNTER — Encounter: Payer: Self-pay | Admitting: Internal Medicine

## 2022-04-07 LAB — CMP14+EGFR
ALT: 24 IU/L (ref 0–32)
AST: 22 IU/L (ref 0–40)
Albumin/Globulin Ratio: 1.7 (ref 1.2–2.2)
Albumin: 4.4 g/dL (ref 3.9–4.9)
Alkaline Phosphatase: 86 IU/L (ref 44–121)
BUN/Creatinine Ratio: 29 — ABNORMAL HIGH (ref 12–28)
BUN: 18 mg/dL (ref 8–27)
Bilirubin Total: 0.3 mg/dL (ref 0.0–1.2)
CO2: 23 mmol/L (ref 20–29)
Calcium: 9.5 mg/dL (ref 8.7–10.3)
Chloride: 105 mmol/L (ref 96–106)
Creatinine, Ser: 0.62 mg/dL (ref 0.57–1.00)
Globulin, Total: 2.6 g/dL (ref 1.5–4.5)
Glucose: 97 mg/dL (ref 70–99)
Potassium: 4.1 mmol/L (ref 3.5–5.2)
Sodium: 144 mmol/L (ref 134–144)
Total Protein: 7 g/dL (ref 6.0–8.5)
eGFR: 101 mL/min/{1.73_m2} (ref >59)

## 2022-04-07 LAB — HEMOGLOBIN A1C
Est. average glucose Bld gHb Est-mCnc: 123 mg/dL
Hgb A1c MFr Bld: 5.9 % — ABNORMAL HIGH (ref 4.8–5.6)

## 2022-04-08 NOTE — Telephone Encounter (Signed)
Dr. Annamaria Boots, please advise if [facility] documentation can be picked up on 04/13/2022? I am not familiar with this documentation.

## 2022-04-09 NOTE — Telephone Encounter (Signed)
I will try to have everything ready to pick up on 12/4 at front desk.

## 2022-04-10 ENCOUNTER — Ambulatory Visit: Payer: BC Managed Care – PPO | Admitting: Nurse Practitioner

## 2022-04-14 ENCOUNTER — Other Ambulatory Visit (INDEPENDENT_AMBULATORY_CARE_PROVIDER_SITE_OTHER): Payer: BC Managed Care – PPO

## 2022-04-14 ENCOUNTER — Encounter: Payer: Self-pay | Admitting: Family Medicine

## 2022-04-14 DIAGNOSIS — E785 Hyperlipidemia, unspecified: Secondary | ICD-10-CM | POA: Diagnosis not present

## 2022-04-14 DIAGNOSIS — E1169 Type 2 diabetes mellitus with other specified complication: Secondary | ICD-10-CM | POA: Diagnosis not present

## 2022-04-14 LAB — LIPID PANEL
Cholesterol: 143 mg/dL (ref 0–200)
HDL: 49.8 mg/dL (ref >39.00)
LDL Cholesterol: 78 mg/dL (ref 0–99)
NonHDL: 93.44
Total CHOL/HDL Ratio: 3
Triglycerides: 76 mg/dL (ref 0.0–149.0)
VLDL: 15.2 mg/dL (ref 0.0–40.0)

## 2022-04-14 LAB — ALT: ALT: 24 U/L (ref 0–35)

## 2022-04-14 LAB — AST: AST: 18 U/L (ref 0–37)

## 2022-04-14 NOTE — Progress Notes (Unsigned)
TeleHealth Visit:  Due to the COVID-19 pandemic, this visit was completed with telemedicine (audio/video) technology to reduce patient and provider exposure as well as to preserve personal protective equipment.   Patty Williams has verbally consented to this TeleHealth visit. The patient is located at home, the provider is located at the [facility] office. The participants in this visit include the listed provider and patient. The visit was conducted today via MyChart video.   Chief Complaint: OBESITY Patty Williams is here to discuss her progress with her obesity treatment plan along with follow-up of her obesity related diagnoses. Patty Williams is on the Category 2 Plan and states she is following her eating plan approximately 50% of the time. Patty Williams states she is doing 0 minutes 0 times per week.  Today's visit was #: 24 Starting weight: 229 lbs Starting date: 06/27/2020  Interim History: Intisar is working on following her plan.  She is doing well with meeting her protein goals, but sometimes her calories are a bit elevated.  She feels she has lost an additional pound.  Subjective:   1. Type 2 diabetes mellitus with other specified complication, unspecified whether long term insulin use (HCC) Patty Williams is working on her diet and weight loss.  She needs forms filled out for the [facility] and repeat labs done.  2. Essential hypertension Patty Williams was started on Cozaar 5 mg once daily by her PCP.  No side effects were noted.  3. Other hyperlipidemia Patty Williams recently started a statin, and she denies myalgias yet but she is concerned about developing them.  4. Vitamin D deficiency Patty Williams is last vitamin D level was lower than goal.  She has been off vitamin D this summer.  Assessment/Plan:   1. Type 2 diabetes mellitus with other specified complication, unspecified whether long term insulin use (HCC) We will refill Ozempic for 1 month.  Carletta will bring in her [facility] forms and get labs done this upcoming Monday.  -  Semaglutide, 1 MG/DOSE, 4 MG/3ML SOPN; Inject 1 mg as directed once a week.  Dispense: 3 mL; Refill: 0 - Hemoglobin A1c - CMP14+EGFR  2. Essential hypertension Patty Williams will continue with her diet and exercise to improve blood pressure control. We will watch for signs of hypotension as she continues her lifestyle modifications.  3. Other hyperlipidemia Patty Williams is okay to start co-Q10 400 mcg OTC to help prevent myalgias.  4. Vitamin D deficiency Patty Williams agreed to restart OTC vitamin D at 2000 units once daily. She will follow-up for routine testing of Vitamin D, at least 2-3 times per year to avoid over-replacement.  5. Obesity, Current BMI 32.5 Patty Williams is currently in the action stage of change. As such, her goal is to continue with weight loss efforts. She has agreed to the Category 2 Plan.   Behavioral modification strategies: increasing water intake.  Patty Williams has agreed to follow-up with our clinic in 4 weeks. She was informed of the importance of frequent follow-up visits to maximize her success with intensive lifestyle modifications for her multiple health conditions.  Patty Williams was informed we would discuss her lab results at her next visit unless there is a critical issue that needs to be addressed sooner. Patty Williams agreed to keep her next visit at the agreed upon time to discuss these results.  Objective:   VITALS: Per patient if applicable, see vitals. GENERAL: Alert and in no acute distress. CARDIOPULMONARY: No increased WOB. Speaking in clear sentences.  PSYCH: Pleasant and cooperative. Speech normal rate and rhythm.  Affect is appropriate. Insight and judgement are appropriate. Attention is focused, linear, and appropriate.  NEURO: Oriented as arrived to appointment on time with no prompting.   Lab Results  Component Value Date   CREATININE 0.62 04/06/2022   BUN 18 04/06/2022   NA 144 04/06/2022   K 4.1 04/06/2022   CL 105 04/06/2022   CO2 23 04/06/2022   Lab Results  Component Value Date    ALT 24 04/06/2022   AST 22 04/06/2022   ALKPHOS 86 04/06/2022   BILITOT 0.3 04/06/2022   Lab Results  Component Value Date   HGBA1C 5.9 (H) 04/06/2022   HGBA1C 5.7 (H) 12/03/2021   HGBA1C 5.9 (H) 07/08/2021   HGBA1C 6.4 02/11/2021   HGBA1C 6.2 (H) 11/06/2020   Lab Results  Component Value Date   INSULIN 19.5 12/03/2021   INSULIN 13.2 07/08/2021   INSULIN 22.6 11/06/2020   INSULIN 19.9 06/27/2020   Lab Results  Component Value Date   TSH 1.29 02/06/2022   Lab Results  Component Value Date   CHOL 191 02/06/2022   HDL 46.10 02/06/2022   LDLCALC 133 (H) 02/06/2022   LDLDIRECT 144.2 09/21/2008   TRIG 62.0 02/06/2022   CHOLHDL 4 02/06/2022   Lab Results  Component Value Date   VD25OH 33.76 02/06/2022   VD25OH 51.9 12/03/2021   VD25OH 54.9 07/08/2021   Lab Results  Component Value Date   WBC 5.8 02/06/2022   HGB 12.4 02/06/2022   HCT 37.0 02/06/2022   MCV 88.6 02/06/2022   PLT 240.0 02/06/2022   Lab Results  Component Value Date   IRON 62 11/09/2008   FERRITIN 3.9 (L) 11/09/2008    Attestation Statements:   Reviewed by clinician on day of visit: allergies, medications, problem list, medical history, surgical history, family history, social history, and previous encounter notes.   I, Trixie Dredge, am acting as transcriptionist for Dennard Nip, MD.  I have reviewed the above documentation for accuracy and completeness, and I agree with the above. - .mmm

## 2022-04-15 ENCOUNTER — Encounter: Payer: Self-pay | Admitting: Internal Medicine

## 2022-04-15 NOTE — Assessment & Plan Note (Signed)
Well-controlled and uncomplicated with no significant exacerbation in many years. Plan-she prefers to continue maintenance 1 puff Symbicort daily, which is fine and seems to be sufficient.

## 2022-04-15 NOTE — Assessment & Plan Note (Signed)
Benefits from CPAP with excellent compliance and control.  No sleep medications used. Plan-continue CPAP auto 5-15 CWP. FAA forms completed.

## 2022-04-17 DIAGNOSIS — G4733 Obstructive sleep apnea (adult) (pediatric): Secondary | ICD-10-CM | POA: Diagnosis not present

## 2022-04-20 ENCOUNTER — Encounter (INDEPENDENT_AMBULATORY_CARE_PROVIDER_SITE_OTHER): Payer: Self-pay | Admitting: Family Medicine

## 2022-04-20 ENCOUNTER — Ambulatory Visit: Payer: BC Managed Care – PPO | Admitting: Internal Medicine

## 2022-04-20 DIAGNOSIS — F4323 Adjustment disorder with mixed anxiety and depressed mood: Secondary | ICD-10-CM | POA: Diagnosis not present

## 2022-04-23 ENCOUNTER — Encounter: Payer: Self-pay | Admitting: Internal Medicine

## 2022-04-24 ENCOUNTER — Ambulatory Visit (INDEPENDENT_AMBULATORY_CARE_PROVIDER_SITE_OTHER): Payer: BC Managed Care – PPO | Admitting: Nurse Practitioner

## 2022-04-24 ENCOUNTER — Encounter: Payer: Self-pay | Admitting: Nurse Practitioner

## 2022-04-24 VITALS — BP 122/68 | HR 69 | Ht 65.0 in | Wt 200.5 lb

## 2022-04-24 DIAGNOSIS — K5909 Other constipation: Secondary | ICD-10-CM | POA: Diagnosis not present

## 2022-04-24 DIAGNOSIS — K219 Gastro-esophageal reflux disease without esophagitis: Secondary | ICD-10-CM

## 2022-04-24 NOTE — Progress Notes (Signed)
Assessment    Patient profile:  Patty Williams is a 61 y.o. female known to Patty Williams with a past medical history of GERD, chronic constipation, colon polyps, DM2, HTN, OSA on CPAP, obesity. See PMH /PSH for additional history   # Chronic GERD. She is concerned about possible side effects of long term high dose PPI but feels dependent on twice daily dosing.   # Chronic constipation. Generally has type II stool ( Bristol stool scale), sometimes type I.  Struggles to consume adequate water intake and ? not enough dietary fiber. Takes one colace daily  # Rectal bleeding with bowel movements, resolved a few months back. She attributes bleeding to hemorrhoids. She gives a remote history of hemorrhoid surgery.    # History of colon polyps. An 8 mm adenoma was removed in Oct 2020. Follow up due Oct 2027  # History of severe endometriosis with chronic colovaginal fistula   Plan   Discussed anti-reflux measures. She is actively working with Patty Williams on weight loss ( [facility]). Goes to bed on an empty stomach. Has hard time sleeping with elevated HOB We discussed some of the reported concerns about PPI. The benefits of taking a PPI most often outweighs these potential risks. Furthermore, the risks of untreated GERD must also be considered (decreased quality of life, esophagitis / peptic strictures / Barrett's esophagus).  She will continue Pantoprazole before breakfast but will take Famotidine instead of Pantoprazole in the evening. If this regimen if ineffective then she will resume BID dosing.  Increase water intake to 60 oz daily Trial of Fibercon capsules as directed on bottle. If prefers she can try Citrucel powder instead Can use glycerin supp as needed for constipation Elevate legs on 6-8" stool during bowel movement Call us if above regimen not effective for constipation and will probably add Miralax.  Call for any recurrent rectal bleeding and will try Anusol supp   Brochure on internal hemorrhoid banding provided as it may be option for her at some point   HPI    Chief complaint: constipation, hemorrhoids. Questions about long term use of PPI   A few months ago Patty Williams was having some rectal bleeding with bowel movements. She had some rectal discomfort similar to when she had hemorrhoids.  She had an appt to be seen here but had to reschedule it for today. No bleeding in a few months now but want to be proactive in managing constipation / hemorrhoid to avoid surgery.  She gives a remote history of hemorrhoid surgery for prolapsed hemorrhoids.  She struggles with constipated described as infrequent BMs / hard stools. She takes 1 colace everyday. She drinks anywhere from 18 to 40 oz water daily. She may not get enough fiber in diet. She hasn't ever tried fiber pills.   Patty Williams has some concerns about potential side effects of long term PPI use but on the other hand feels dependent on twice daily dosing.    Previous GI Evaluation   Oct 2020 EGD for refractory GERD symptoms and screening colonoscopy EGD  - Normal esophagus. - Normal stomach. - Normal examined duodenum. - No specimens collected  Colonoscopy -One 8 mm polyp in the distal transverse colon, removed with a hot snare. Resected and retrieved. - Moderate diverticulosis in the sigmoid colon. - The examination was otherwise normal on direct and retroflexion views.  Diagnosis Surgical [P], colon, transverse, polyp - TUBULAR ADENOMA - NEGATIVE FOR HIGH-GRADE DYSPLASIA OR MALIGNANCY - LATERAL MUCOSAL MARGINS  ARE NEGATIVE FOR DYSPLASIA   Labs:     Latest Ref Rng & Units 02/06/2022   10:33 AM 06/27/2020    8:40 AM 12/22/2019    8:03 AM  CBC  WBC 4.0 - 10.5 K/uL 5.8  5.7  7.5   Hemoglobin 12.0 - 15.0 g/dL 12.4  13.0  12.1   Hematocrit 36.0 - 46.0 % 37.0  39.0  36.9   Platelets 150.0 - 400.0 K/uL 240.0  268  278.0        Latest Ref Rng & Units 04/14/2022    7:58 AM 04/06/2022    8:12 AM  02/06/2022   10:33 AM  Hepatic Function  Total Protein 6.0 - 8.5 g/dL  7.0  6.7   Albumin 3.9 - 4.9 g/dL  4.4  4.1   AST 0 - 37 U/L _0 ALT 0 - 35 U/L _1 Alk Phosphatase 44 - 121 IU/L  86  74   Total Bilirubin 0.0 - 1.2 mg/dL  0.3  0.5      Past Medical History:  Diagnosis Date   Anxiety    Asthma    Back pain    Bilateral ovarian cysts    Bruxism    Constipation    Diabetes mellitus type II    mild   Diverticulosis 2014   Mild   Dry mouth    Elevated blood pressure reading without diagnosis of hypertension    Endometriosis    Severe   Fibroids    GERD (gastroesophageal reflux disease)    History of ovarian cyst    HLD (hyperlipidemia)    Hx of adenomatous polyp of colon 03/24/2019   Hyperglycemia    Hyperlipidemia    Hypertension    Iron deficiency anemia    Joint pain    Menorrhagia    Obesity    Obesity    OSA on CPAP    Plantar fasciitis    with orthotics-much difficulty adjusting these   PMB (postmenopausal bleeding)    Pneumonia    history of   PONV (postoperative nausea and vomiting)    Pre-diabetes    Rectovaginal fistula    Sleep apnea    Uterine adhesion    Uterine fibroid    Wears glasses     Past Surgical History:  Procedure Laterality Date   COLONOSCOPY  01/2009   DILATATION & CURETTAGE/HYSTEROSCOPY WITH MYOSURE N/A 09/22/2018   Procedure: DILATATION & CURETTAGE/HYSTEROSCOPY;  Surgeon: Aloha Gell, MD;  Location: [facility];  Service: Gynecology;  Laterality: N/A;   HEMORRHOID SURGERY     LAPAROSCOPY     endometriosis   NASAL SEPTOPLASTY W/ TURBINOPLASTY Bilateral 09/02/2021   Procedure: NASAL SEPTOPLASTY WITH SUBMUCOSAL RESECTION OF TURBINATES;  Surgeon: Beverly Gust, MD;  Location: [facility];  Service: ENT;  Laterality: Bilateral;   TONSILLECTOMY AND ADENOIDECTOMY     TRIGGER FINGER RELEASE     UPPER GI ENDOSCOPY  01/2009   uterine biopsy     uterine cyst exploration     UTERINE SUSPENSION      WISDOM TOOTH EXTRACTION      Current Medications, Allergies, Family History and Social History were reviewed in [facility] record.     Current Outpatient Medications  Medication Sig Dispense Refill   albuterol (VENTOLIN HFA) 108 (90 Base) MCG/ACT inhaler Inhale 2 puffs into the lungs every 4 (four) hours as needed. 8.5 g 1   budesonide-formoterol (  SYMBICORT) 80-4.5 MCG/ACT inhaler USE 2 INHALATIONS TWICE A DAY 10.2 g 5   docusate sodium (COLACE) 100 MG capsule Take 100 mg by mouth daily as needed for mild constipation.     magnesium oxide (MAG-OX) 400 MG tablet Take by mouth.     Multiple Vitamin (MULTIVITAMIN) tablet Take 1 tablet by mouth daily.     naproxen (NAPROSYN) 500 MG tablet Take 1 tablet (500 mg total) by mouth 2 (two) times daily as needed for moderate pain. With a meal 60 tablet 3   olmesartan (BENICAR) 40 MG tablet Take 1 tablet (40 mg total) by mouth daily. 90 tablet 2   Omega-3 Fatty Acids (FISH OIL) 1000 MG CAPS Take 2,000 mg by mouth daily.     pantoprazole (PROTONIX) 40 MG tablet Take 1 tablet (40 mg total) by mouth 2 (two) times daily before a meal. Breakfast and supper 60 tablet 11   rosuvastatin (CRESTOR) 5 MG tablet Take 1 tablet (5 mg total) by mouth daily. 90 tablet 3   Semaglutide, 1 MG/DOSE, 4 MG/3ML SOPN Inject 1 mg as directed once a week. 3 mL 0   TURMERIC PO Take by mouth.     No current facility-administered medications for this visit.    Review of Systems: No chest pain. No shortness of breath. No urinary complaints.    Physical Exam  Wt Readings from Last 3 Encounters:  03/19/22 199 lb 9.6 oz (90.5 kg)  03/03/22 198 lb 8 oz (90 kg)  03/02/22 195 lb (88.5 kg)    LMP 10/08/2018 (Exact Date)  VSS. BP 122/68    Constitutional:  Generally well appearing female in no acute distress. Psychiatric: Pleasant. Normal mood and affect. Behavior is normal. EENT: Pupils normal.  Conjunctivae are normal. No scleral icterus. Neck  supple.  Cardiovascular: Normal rate, regular rhythm.  Pulmonary/chest: Effort normal and breath sounds normal. No wheezing, rales or rhonchi. Abdominal: Soft, nondistended, nontender. Bowel sounds active throughout. There are no masses palpable. No hepatomegaly. Neurological: Alert and oriented to person place and time. Musculoskeletal: No edema Skin: Skin is warm and dry. No rashes noted.  I spent 30 minutes total reviewing records, obtaining history, performing exam, counseling patient and documenting visit / findings.   Tye Savoy, NP  04/24/2022, 1:18 PM

## 2022-04-24 NOTE — Patient Instructions (Signed)
If you are age 62 or younger, your body mass index should be between 19-25. Your Body mass index is 33.36 kg/m. If this is out of the aformentioned range listed, please consider follow up with your Primary Care Provider.  ________________________________________________________  The [facility] providers would like to encourage you to use [facility] to communicate with providers for non-urgent requests or questions.  Due to long hold times on the telephone, sending your provider a message by [facility] may be a faster and more efficient way to get a response.  Please allow 48 business hours for a response.  Please remember that this is for non-urgent requests.  _______________________________________________________  Please purchase the following medications over the counter and take as directed:  Glycerin suppositories as needed. Use FiberCon tablet as directed.   INCREASE water intake to 60 ounces daily.  Please call our office if you have recurrent bleeding.  Thank you for entrusting me with your care and choosing [facility]  Tye Savoy, NP

## 2022-04-27 ENCOUNTER — Telehealth: Payer: Self-pay | Admitting: Internal Medicine

## 2022-04-27 DIAGNOSIS — F4323 Adjustment disorder with mixed anxiety and depressed mood: Secondary | ICD-10-CM | POA: Diagnosis not present

## 2022-04-27 NOTE — Telephone Encounter (Signed)
Pt left msg on [facility] for case notes. Will be in area today  (1-3 PM)and tomorrow AM. Please call to advise. [phone]

## 2022-04-27 NOTE — Telephone Encounter (Signed)
Spoke with pt who states she has already picked up letter and everything looks correct. Nothing further needed at this time.

## 2022-04-27 NOTE — Telephone Encounter (Signed)
States even a cut and paste to my chart of her last visit notes is OK.

## 2022-04-28 ENCOUNTER — Ambulatory Visit (INDEPENDENT_AMBULATORY_CARE_PROVIDER_SITE_OTHER): Payer: BC Managed Care – PPO | Admitting: Family Medicine

## 2022-04-28 ENCOUNTER — Encounter (INDEPENDENT_AMBULATORY_CARE_PROVIDER_SITE_OTHER): Payer: Self-pay | Admitting: Family Medicine

## 2022-04-28 VITALS — BP 123/76 | HR 69 | Temp 97.5°F | Ht 65.0 in | Wt 195.0 lb

## 2022-04-28 DIAGNOSIS — F3289 Other specified depressive episodes: Secondary | ICD-10-CM | POA: Diagnosis not present

## 2022-04-28 DIAGNOSIS — E1169 Type 2 diabetes mellitus with other specified complication: Secondary | ICD-10-CM

## 2022-04-28 DIAGNOSIS — Z7985 Long-term (current) use of injectable non-insulin antidiabetic drugs: Secondary | ICD-10-CM

## 2022-04-28 DIAGNOSIS — Z6832 Body mass index (BMI) 32.0-32.9, adult: Secondary | ICD-10-CM | POA: Diagnosis not present

## 2022-04-28 DIAGNOSIS — E669 Obesity, unspecified: Secondary | ICD-10-CM

## 2022-04-28 MED ORDER — SEMAGLUTIDE (1 MG/DOSE) 4 MG/3ML ~~LOC~~ SOPN: 1.0000 mg | PEN_INJECTOR | SUBCUTANEOUS | 0 refills | Status: DC

## 2022-05-18 DIAGNOSIS — G4733 Obstructive sleep apnea (adult) (pediatric): Secondary | ICD-10-CM | POA: Diagnosis not present

## 2022-05-19 DIAGNOSIS — F4323 Adjustment disorder with mixed anxiety and depressed mood: Secondary | ICD-10-CM | POA: Diagnosis not present

## 2022-05-19 NOTE — Progress Notes (Signed)
Chief Complaint:   OBESITY Patty Williams is here to discuss her progress with her obesity treatment plan along with follow-up of her obesity related diagnoses. Patty Williams is on the Category 2 Plan and states she is following her eating plan approximately 40% of the time. Patty Williams states she is on the recumbent bike for 15 minutes 5 times per week.  Today's visit was #: 25 Starting weight: 229 lbs Starting date: 06/27/2020 Today's weight: 195 lbs Today's date: 04/28/2022 Total lbs lost to date: 34 Total lbs lost since last in-office visit: 0  Interim History: Patty Williams has done very well with maintaining her weight loss over the holidays.  She will be traveling over Christmas.  Subjective:   1. Type 2 diabetes mellitus with other specified complication, unspecified whether long term insulin use (Patty Williams) Patty Williams is stable on Ozempic, and is well-controlled with no hypoglycemia.  2. Other depression, with emotional eating behaviors Patty Williams is working on decreasing emotional eating behaviors.  She is open to discussing strategies to help.  Assessment/Plan:   1. Type 2 diabetes mellitus with other specified complication, unspecified whether long term insulin use (Patty Williams) Patty Williams will continue Ozempic 1 mg once weekly, and we will refill for 1 month.  - Semaglutide, 1 MG/DOSE, 4 MG/3ML SOPN; Inject 1 mg as directed once a week.  Dispense: 3 mL; Refill: 0  2. Other depression, with emotional eating behaviors Mechanisms of emotional eating behavior were discussed.  Patty Williams will work on activities that she enjoys that helps her feel productive and creative.  3. Obesity, Current BMI 32.5 Patty Williams is currently in the action stage of change. As such, her goal is to continue with weight loss efforts. She has agreed to the Category 2 Plan.   Exercise goals: As is.   Behavioral modification strategies: increasing lean protein intake, emotional eating strategies, and holiday eating strategies .  Patty Williams has agreed to follow-up with  our clinic in 4 weeks. She was informed of the importance of frequent follow-up visits to maximize her success with intensive lifestyle modifications for her multiple health conditions.   Objective:   Blood pressure 123/76, pulse 69, temperature (!) 97.5 F (36.4 C), height '5\' 5"'$  (1.651 m), weight 195 lb (88.5 kg), last menstrual period 10/08/2018, SpO2 99 %. Body mass index is 32.45 kg/m.  General: Cooperative, alert, well developed, in no acute distress. HEENT: Conjunctivae and lids unremarkable. Cardiovascular: Regular rhythm.  Lungs: Normal work of breathing. Neurologic: No focal deficits.   Lab Results  Component Value Date   CREATININE 0.62 04/06/2022   BUN 18 04/06/2022   NA 144 04/06/2022   K 4.1 04/06/2022   CL 105 04/06/2022   CO2 23 04/06/2022   Lab Results  Component Value Date   ALT 24 04/14/2022   AST 18 04/14/2022   ALKPHOS 86 04/06/2022   BILITOT 0.3 04/06/2022   Lab Results  Component Value Date   HGBA1C 5.9 (H) 04/06/2022   HGBA1C 5.7 (H) 12/03/2021   HGBA1C 5.9 (H) 07/08/2021   HGBA1C 6.4 02/11/2021   HGBA1C 6.2 (H) 11/06/2020   Lab Results  Component Value Date   INSULIN 19.5 12/03/2021   INSULIN 13.2 07/08/2021   INSULIN 22.6 11/06/2020   INSULIN 19.9 06/27/2020   Lab Results  Component Value Date   TSH 1.29 02/06/2022   Lab Results  Component Value Date   CHOL 143 04/14/2022   HDL 49.80 04/14/2022   LDLCALC 78 04/14/2022   LDLDIRECT 144.2 09/21/2008   TRIG 76.0  04/14/2022   CHOLHDL 3 04/14/2022   Lab Results  Component Value Date   VD25OH 33.76 02/06/2022   VD25OH 51.9 12/03/2021   VD25OH 54.9 07/08/2021   Lab Results  Component Value Date   WBC 5.8 02/06/2022   HGB 12.4 02/06/2022   HCT 37.0 02/06/2022   MCV 88.6 02/06/2022   PLT 240.0 02/06/2022   Lab Results  Component Value Date   IRON 62 11/09/2008   FERRITIN 3.9 (L) 11/09/2008   Attestation Statements:   Reviewed by clinician on day of visit: allergies,  medications, problem list, medical history, surgical history, family history, social history, and previous encounter notes.   I, Trixie Dredge, am acting as transcriptionist for Dennard Nip, MD.  I have reviewed the above documentation for accuracy and completeness, and I agree with the above. -  Dennard Nip, MD

## 2022-05-22 DIAGNOSIS — D2362 Other benign neoplasm of skin of left upper limb, including shoulder: Secondary | ICD-10-CM | POA: Diagnosis not present

## 2022-05-22 DIAGNOSIS — L308 Other specified dermatitis: Secondary | ICD-10-CM | POA: Diagnosis not present

## 2022-05-22 DIAGNOSIS — L821 Other seborrheic keratosis: Secondary | ICD-10-CM | POA: Diagnosis not present

## 2022-05-25 ENCOUNTER — Ambulatory Visit (INDEPENDENT_AMBULATORY_CARE_PROVIDER_SITE_OTHER): Payer: BC Managed Care – PPO | Admitting: Family Medicine

## 2022-05-25 ENCOUNTER — Other Ambulatory Visit: Payer: Self-pay | Admitting: Family Medicine

## 2022-05-25 ENCOUNTER — Encounter (INDEPENDENT_AMBULATORY_CARE_PROVIDER_SITE_OTHER): Payer: Self-pay | Admitting: Family Medicine

## 2022-05-25 VITALS — BP 109/72 | HR 90 | Temp 98.2°F | Ht 65.0 in | Wt 194.6 lb

## 2022-05-25 DIAGNOSIS — E1169 Type 2 diabetes mellitus with other specified complication: Secondary | ICD-10-CM | POA: Diagnosis not present

## 2022-05-25 DIAGNOSIS — Z7985 Long-term (current) use of injectable non-insulin antidiabetic drugs: Secondary | ICD-10-CM

## 2022-05-25 DIAGNOSIS — Z6832 Body mass index (BMI) 32.0-32.9, adult: Secondary | ICD-10-CM | POA: Diagnosis not present

## 2022-05-25 DIAGNOSIS — E66812 Obesity, class 2: Secondary | ICD-10-CM

## 2022-05-25 DIAGNOSIS — E669 Obesity, unspecified: Secondary | ICD-10-CM | POA: Diagnosis not present

## 2022-05-25 MED ORDER — TIRZEPATIDE 7.5 MG/0.5ML ~~LOC~~ SOAJ: 7.5000 mg | SUBCUTANEOUS | 0 refills | Status: DC

## 2022-05-26 ENCOUNTER — Telehealth (INDEPENDENT_AMBULATORY_CARE_PROVIDER_SITE_OTHER): Payer: Self-pay

## 2022-05-26 ENCOUNTER — Other Ambulatory Visit: Payer: Self-pay | Admitting: Family Medicine

## 2022-05-26 NOTE — Telephone Encounter (Signed)
(  [employer] #: [id number]  Mounjaro 7.'5MG'$ /0.5ML pen-injectors  Prior auth submitted thru covermymeds.

## 2022-05-28 ENCOUNTER — Other Ambulatory Visit: Payer: Self-pay | Admitting: *Deleted

## 2022-05-28 MED ORDER — OLMESARTAN MEDOXOMIL 40 MG PO TABS: 40.0000 mg | ORAL_TABLET | Freq: Every day | ORAL | 1 refills | Status: DC

## 2022-06-02 DIAGNOSIS — H5340 Unspecified visual field defects: Secondary | ICD-10-CM | POA: Diagnosis not present

## 2022-06-02 DIAGNOSIS — H02839 Dermatochalasis of unspecified eye, unspecified eyelid: Secondary | ICD-10-CM | POA: Diagnosis not present

## 2022-06-02 DIAGNOSIS — H40033 Anatomical narrow angle, bilateral: Secondary | ICD-10-CM | POA: Diagnosis not present

## 2022-06-02 DIAGNOSIS — H57813 Brow ptosis, bilateral: Secondary | ICD-10-CM | POA: Diagnosis not present

## 2022-06-04 ENCOUNTER — Other Ambulatory Visit (INDEPENDENT_AMBULATORY_CARE_PROVIDER_SITE_OTHER): Payer: Self-pay

## 2022-06-04 ENCOUNTER — Encounter (INDEPENDENT_AMBULATORY_CARE_PROVIDER_SITE_OTHER): Payer: Self-pay | Admitting: Family Medicine

## 2022-06-04 DIAGNOSIS — E1169 Type 2 diabetes mellitus with other specified complication: Secondary | ICD-10-CM

## 2022-06-04 MED ORDER — SEMAGLUTIDE (1 MG/DOSE) 4 MG/3ML ~~LOC~~ SOPN: 1.0000 mg | PEN_INJECTOR | SUBCUTANEOUS | 0 refills | Status: DC

## 2022-06-05 DIAGNOSIS — M542 Cervicalgia: Secondary | ICD-10-CM | POA: Diagnosis not present

## 2022-06-08 DIAGNOSIS — F4323 Adjustment disorder with mixed anxiety and depressed mood: Secondary | ICD-10-CM | POA: Diagnosis not present

## 2022-06-16 ENCOUNTER — Encounter (INDEPENDENT_AMBULATORY_CARE_PROVIDER_SITE_OTHER): Payer: Self-pay | Admitting: Family Medicine

## 2022-06-16 ENCOUNTER — Ambulatory Visit (INDEPENDENT_AMBULATORY_CARE_PROVIDER_SITE_OTHER): Payer: BC Managed Care – PPO | Admitting: Family Medicine

## 2022-06-16 VITALS — BP 145/82 | HR 65 | Temp 97.6°F | Ht 65.0 in | Wt 193.0 lb

## 2022-06-16 DIAGNOSIS — Z6830 Body mass index (BMI) 30.0-30.9, adult: Secondary | ICD-10-CM | POA: Insufficient documentation

## 2022-06-16 DIAGNOSIS — F439 Reaction to severe stress, unspecified: Secondary | ICD-10-CM | POA: Diagnosis not present

## 2022-06-16 DIAGNOSIS — Z6831 Body mass index (BMI) 31.0-31.9, adult: Secondary | ICD-10-CM | POA: Insufficient documentation

## 2022-06-16 DIAGNOSIS — E1169 Type 2 diabetes mellitus with other specified complication: Secondary | ICD-10-CM

## 2022-06-16 DIAGNOSIS — E669 Obesity, unspecified: Secondary | ICD-10-CM | POA: Insufficient documentation

## 2022-06-16 DIAGNOSIS — Z6832 Body mass index (BMI) 32.0-32.9, adult: Secondary | ICD-10-CM

## 2022-06-16 DIAGNOSIS — Z7985 Long-term (current) use of injectable non-insulin antidiabetic drugs: Secondary | ICD-10-CM

## 2022-06-16 MED ORDER — SEMAGLUTIDE (1 MG/DOSE) 4 MG/3ML ~~LOC~~ SOPN: 1.0000 mg | PEN_INJECTOR | SUBCUTANEOUS | 0 refills | Status: DC

## 2022-06-16 MED ORDER — SEMAGLUTIDE (2 MG/DOSE) 8 MG/3ML ~~LOC~~ SOPN: 2.0000 mg | PEN_INJECTOR | SUBCUTANEOUS | 0 refills | Status: DC

## 2022-06-16 NOTE — Progress Notes (Signed)
Chief Complaint:   OBESITY Patty Williams is here to discuss her progress with her obesity treatment plan along with follow-up of her obesity related diagnoses. Patty Williams is on the Category 2 Plan and states she is following her eating plan approximately 35% of the time. Patty Williams states she is walking, biking for 10 minutes 2 times per week.  Today's visit was #: 63 Starting weight: 229 LBS Starting date: 06/27/2020 Today's weight: 194 LBS Today's date: 05/25/2022 Total lbs lost to date: 35 LBS Total lbs lost since last in-office visit: 1 LB  Interim History: Patient did great over the holiday and lost 1 LB.  Patient still struggles to get all her protein. Patient has a new Interior and spatial designer, Patty Williams, which will increase her activity.  Subjective:   1. Type 2 diabetes mellitus with obesity (Patty Williams) Patient is not checking blood sugars at home.  No history of MEN 1 or 2 or medullary thyroid cancer.   No personal history of pancreatitis.   Assessment/Plan:  No orders of the defined types were placed in this encounter.   Medications Discontinued During This Encounter  Medication Reason   Semaglutide, 1 MG/DOSE, 4 MG/3ML SOPN      Meds ordered this encounter  Medications   DISCONTD: tirzepatide (MOUNJARO) 7.5 MG/0.5ML Pen    Sig: Inject 7.5 mg into the skin once a week.    Dispense:  2 mL    Refill:  0   AMBULATORY NON FORMULARY MEDICATION    Sig: Single glucometer with lancets, test strips-Check FBS and 2 hour PP(BID)    Dispense:  1 each    Refill:  0     1. Type 2 diabetes mellitus with obesity (Patty Williams) Send new prescription for generic glucometer supplies.  Change from Ozempic to Mounjaro 7.5 mg weekly.  After extensive review of risk and benefit.  Continue PNP and increase exercise with new puppy.  Begin- tirzepatide (MOUNJARO) 7.5 MG/0.5ML Pen; Inject 7.5 mg into the skin once a week.  Dispense: 2 mL; Refill: 0  2. Obesity, Current BMI 32.4 1.  Journal and remove this at and keep a  daily log.  Bring log into next office visit for review. 2.  Change from Ozempic to [facility].  Patty Williams is currently in the action stage of change. As such, her goal is to continue with weight loss efforts. She has agreed to the Category 2 Plan with breakfast and lunch options.  Exercise goals:  Increase activity as tolerated.  Behavioral modification strategies: planning for success and keeping a strict food journal.  Patty Williams has agreed to follow-up with our clinic in 3 weeks. She was informed of the importance of frequent follow-up visits to maximize her success with intensive lifestyle modifications for her multiple health conditions.   Objective:   Blood pressure 109/72, pulse 90, temperature 98.2 F (36.8 C), height 5' 5"$  (1.651 m), weight 194 lb 9.6 oz (88.3 kg), last menstrual period 10/08/2018, SpO2 97 %. Body mass index is 32.38 kg/m.  General: Cooperative, alert, well developed, in no acute distress. HEENT: Conjunctivae and lids unremarkable. Cardiovascular: Regular rhythm.  Lungs: Normal work of breathing. Neurologic: No focal deficits.   Lab Results  Component Value Date   CREATININE 0.62 04/06/2022   BUN 18 04/06/2022   NA 144 04/06/2022   K 4.1 04/06/2022   CL 105 04/06/2022   CO2 23 04/06/2022   Lab Results  Component Value Date   ALT 24 04/14/2022   AST 18 04/14/2022  ALKPHOS 86 04/06/2022   BILITOT 0.3 04/06/2022   Lab Results  Component Value Date   HGBA1C 5.9 (H) 04/06/2022   HGBA1C 5.7 (H) 12/03/2021   HGBA1C 5.9 (H) 07/08/2021   HGBA1C 6.4 02/11/2021   HGBA1C 6.2 (H) 11/06/2020   Lab Results  Component Value Date   INSULIN 19.5 12/03/2021   INSULIN 13.2 07/08/2021   INSULIN 22.6 11/06/2020   INSULIN 19.9 06/27/2020   Lab Results  Component Value Date   TSH 1.29 02/06/2022   Lab Results  Component Value Date   CHOL 143 04/14/2022   HDL 49.80 04/14/2022   LDLCALC 78 04/14/2022   LDLDIRECT 144.2 09/21/2008   TRIG 76.0 04/14/2022   CHOLHDL  3 04/14/2022   Lab Results  Component Value Date   VD25OH 33.76 02/06/2022   VD25OH 51.9 12/03/2021   VD25OH 54.9 07/08/2021   Lab Results  Component Value Date   WBC 5.8 02/06/2022   HGB 12.4 02/06/2022   HCT 37.0 02/06/2022   MCV 88.6 02/06/2022   PLT 240.0 02/06/2022   Lab Results  Component Value Date   IRON 62 11/09/2008   FERRITIN 3.9 (L) 11/09/2008   Attestation Statements:   Reviewed by clinician on day of visit: allergies, medications, problem list, medical history, surgical history, family history, social history, and previous encounter notes.  I, Davy Pique, RMA, am acting as Location manager for [employer], DO.   I have reviewed the above documentation for accuracy and completeness, and I agree with the above. Patty Williams, D.O.  The [place] was signed into law in 2016 which includes the topic of electronic health records.  This provides immediate access to information in MyChart.  This includes consultation notes, operative notes, office notes, lab results and pathology reports.  If you have any questions about what you read please let us know at your next visit so we can discuss your concerns and take corrective action if need be.  We are right here with you.

## 2022-06-19 ENCOUNTER — Encounter (INDEPENDENT_AMBULATORY_CARE_PROVIDER_SITE_OTHER): Payer: Self-pay | Admitting: Family Medicine

## 2022-06-19 NOTE — Progress Notes (Signed)
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 -Symbicort 80, CPAP auto 5-15/ [facility]                AirSense 10 AutoSet   Download- compliance  100%, AHI 1.8/ hr Body weight today-  199 lbs Covid vax-4 Moderna                                   Flu vax-had CPAP download is reviewed, documenting excellent use and control over the past 12 months.  She has a travel machine which she has used at times in the past. Had septoplasty in April and had to be off CPAP briefly until that healed.  Now back to full unrestricted use of CPAP. Asthma control has been excellent with no significant exacerbation in years.  By current nomenclature she would be considered mild, intermittent, uncomplicated.  Realistically, asthma is in remission, possibly triggered very occasionally by viral respiratory infection.  She never needs her rescue inhaler.  Uses 1 puff of maintenance Symbicort daily to make sure there is no problem, but I do not really think she needs it.  PFT is pending and will be addended to this note.  PFT 03/27/22 (addendum)- Within Normal Limits FEV1  2.63/101%, FVC  3.05/90%, FEV1/FVC ratio 0.86/110% Insignificant change with bronchodilator.  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 -Symbicort 80, CPAP auto 5-15/ [facility]                AirSense 10 AutoSet   Download- compliance  100%, AHI 2.1/ hr Body weight today-   Covid vax-4 Moderna                                   Flu vax- Download reviewed.  She continues excellent compliance and control with CPAP. Mild intermittent asthma uncomplicated.  Using Symbicort 80 only 1 time daily and has almost never needed rescue inhaler-usually only if she has a chest cold.  Conservative symptom management options reviewed. She is stable and doing very well.  I see no reason to restrict her her hot air balloon activity.  ROS-see HPI   + = positive Constitutional:    weight loss, night sweats, fevers, chills, fatigue, lassitude. HEENT:    headaches, difficulty swallowing, tooth/dental problems, sore throat,       sneezing, itching, ear ache, nasal congestion, post nasal drip, snoring CV:    chest pain, orthopnea, PND, swelling in lower extremities, anasarca,                                   dizziness, palpitations Resp:   +shortness of  breath with exertion or at rest.                productive cough,   non-productive cough, coughing up of blood.              change in color of mucus.  wheezing.   Skin:    rash or lesions. GI:    +heartburn, indigestion, abdominal pain, nausea, vomiting, diarrhea,                 change in bowel habits, loss of appetite GU: dysuria, change in color of urine, no urgency or frequency,  flank pain. MS:   joint pain, stiffness, decreased range of motion, back pain. Neuro-     nothing unusual Psych:  change in mood or affect.  depression or anxiety.   memory loss.  OBJ- Physical Exam General- Alert, Oriented, Affect-appropriate, Distress- none acute, + overweight Skin- rash-none, lesions- none, excoriation- none Lymphadenopathy- none Head- atraumatic            Eyes- Gross vision intact, PERRLA, conjunctivae and secretions clear            Ears- Hearing, canals-normal            Nose-  Clear, no-Septal dev, mucus, polyps, erosion, perforation             Throat- Mallampati II-III , mucosa clear , drainage- none, tonsils- atrophic Neck- flexible , trachea midline, no stridor , thyroid nl, carotid no bruit Chest - symmetrical excursion , unlabored           Heart/CV- RRR , no murmur , no gallop  , no rub, nl s1 s2                           - JVD- none , edema- none, stasis changes- none, varices- none           Lung- clear to P&A- no crackles, wheeze- none, cough- none , dullness-none, rub- none           Chest wall-  Abd-  Br/ Gen/ Rectal- Not done, not indicated Extrem- cyanosis- none, clubbing, none, atrophy- none, strength- nl Neuro- grossly intact to observation

## 2022-06-22 ENCOUNTER — Ambulatory Visit (INDEPENDENT_AMBULATORY_CARE_PROVIDER_SITE_OTHER): Payer: BC Managed Care – PPO | Admitting: Internal Medicine

## 2022-06-22 ENCOUNTER — Encounter: Payer: Self-pay | Admitting: Internal Medicine

## 2022-06-22 VITALS — BP 124/82 | HR 75 | Ht 65.0 in | Wt 198.2 lb

## 2022-06-22 DIAGNOSIS — G4733 Obstructive sleep apnea (adult) (pediatric): Secondary | ICD-10-CM

## 2022-06-22 DIAGNOSIS — J4520 Mild intermittent asthma, uncomplicated: Secondary | ICD-10-CM

## 2022-06-22 MED ORDER — ALBUTEROL SULFATE HFA 108 (90 BASE) MCG/ACT IN AERS: INHALATION_SPRAY | RESPIRATORY_TRACT | 12 refills | Status: DC

## 2022-06-22 MED ORDER — BUDESONIDE-FORMOTEROL FUMARATE 80-4.5 MCG/ACT IN AERO: INHALATION_SPRAY | RESPIRATORY_TRACT | 4 refills | Status: DC

## 2022-06-22 NOTE — Patient Instructions (Addendum)
We can continue CPAP auto 5-15  refills sent for Symbicort and albuterol rescue inhalers  We will make f/u appointment in late July  Simple measures for dry cough- try Zero Sugar Marolyn Hammock Rancher hard candies   (in bags at [employer], [employer])          OTC Delsym cough Syrup  We can also Rx Tessalon Perles (benzonatate) for cough if needed

## 2022-06-23 ENCOUNTER — Ambulatory Visit (INDEPENDENT_AMBULATORY_CARE_PROVIDER_SITE_OTHER): Payer: BC Managed Care – PPO | Admitting: Family Medicine

## 2022-06-23 DIAGNOSIS — F4323 Adjustment disorder with mixed anxiety and depressed mood: Secondary | ICD-10-CM | POA: Diagnosis not present

## 2022-06-23 MED ORDER — AMBULATORY NON FORMULARY MEDICATION: 0 refills | Status: AC

## 2022-06-26 DIAGNOSIS — M48062 Spinal stenosis, lumbar region with neurogenic claudication: Secondary | ICD-10-CM | POA: Diagnosis not present

## 2022-06-26 DIAGNOSIS — G8929 Other chronic pain: Secondary | ICD-10-CM | POA: Diagnosis not present

## 2022-06-26 DIAGNOSIS — M5442 Lumbago with sciatica, left side: Secondary | ICD-10-CM | POA: Diagnosis not present

## 2022-06-26 DIAGNOSIS — M5441 Lumbago with sciatica, right side: Secondary | ICD-10-CM | POA: Diagnosis not present

## 2022-06-29 DIAGNOSIS — M542 Cervicalgia: Secondary | ICD-10-CM | POA: Diagnosis not present

## 2022-06-30 NOTE — Progress Notes (Signed)
Chief Complaint:   OBESITY Patty Williams is here to discuss her progress with her obesity treatment plan along with follow-up of her obesity related diagnoses. Patty Williams is on the Category 2 Plan with breakfast and lunch options and states she is following her eating plan approximately 75% of the time. Patty Williams states she is walking for 20 minutes 5 times per week.  Today's visit was #: 27 Starting weight: 229 lbs Starting date: 06/27/2020 Today's weight: 193 lbs Today's date: 06/16/2022 Total lbs lost to date: 36 Total lbs lost since last in-office visit: 1  Interim History: Patty Williams continues to do well with her diet and weight loss. Her hunger is mostly controlled. She is increasing her exercise. 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   Subjective:   1. Type 2 diabetes mellitus with other specified complication, unspecified whether long term insulin use (HCC) Patty Williams was changed to Patty Williams and she had diarrhea and significant worsening GERD. She changed back to Patty Williams.   2. Patty Williams Patty Williams has increased Patty Williams at work. He is considering retiring and planning her future.   Assessment/Plan:   1. Type 2 diabetes mellitus with other specified complication, unspecified whether long term insulin use (HCC) Patty Williams agreed to increase Patty Williams to 2 mg, and we will refill for 90 days (patient is to take 1 mg or 36 clicks for now).   - Semaglutide, 2 MG/DOSE, 8 MG/3ML SOPN; Inject 2 mg as directed once a week.  Dispense: 9 mL; Refill: 0  2. Patty Williams Patty Williams was offered support and encouragement. She will continue to work on decreasing emotional eating behaviors.   3. BMI 32.0-32.9,adult  4. Obesity, Beginning BMI 38.11 Patty Williams is currently in the action stage of change. As such, her goal is to continue with weight loss efforts. She has agreed to the Category 2 Plan and keeping a food journal and adhering to recommended goals of 400-600 calories and 35+ grams  of protein at supper daily.   Exercise goals: As is.   Behavioral modification strategies: increasing lean protein intake.  Patty Williams has agreed to follow-up with our clinic in 4 weeks. She was informed of the importance of frequent follow-up visits to maximize her success with intensive lifestyle modifications for her multiple health conditions.   Objective:   Blood pressure (!) 145/82, pulse 65, temperature 97.6 F (36.4 C), height 5\' 5"  (1.651 m), weight 193 lb (87.5 kg), last menstrual period 10/08/2018, SpO2 98 %. Body mass index is 32.12 kg/m.  General: Cooperative, alert, well developed, in no acute distress. HEENT: Conjunctivae and lids unremarkable. Cardiovascular: Regular rhythm.  Lungs: Normal work of breathing. Neurologic: No focal deficits.   Lab Results  Component Value Date   CREATININE 0.62 04/06/2022   BUN 18 04/06/2022   NA 144 04/06/2022   K 4.1 04/06/2022   CL 105 04/06/2022   CO2 23 04/06/2022   Lab Results  Component Value Date   ALT 24 04/14/2022   AST 18 04/14/2022   ALKPHOS 86 04/06/2022   BILITOT 0.3 04/06/2022   Lab Results  Component Value Date   HGBA1C 5.9 (H) 04/06/2022   HGBA1C 5.7 (H) 12/03/2021   HGBA1C 5.9 (H) 07/08/2021   HGBA1C 6.4 02/11/2021   HGBA1C 6.2 (H) 11/06/2020   Lab Results  Component Value Date   INSULIN 19.5 12/03/2021   INSULIN 13.2 07/08/2021   INSULIN 22.6 11/06/2020   INSULIN 19.9 06/27/2020   Lab  Results  Component Value Date   TSH 1.29 02/06/2022   Lab Results  Component Value Date   CHOL 143 04/14/2022   HDL 49.80 04/14/2022   LDLCALC 78 04/14/2022   LDLDIRECT 144.2 09/21/2008   TRIG 76.0 04/14/2022   CHOLHDL 3 04/14/2022   Lab Results  Component Value Date   VD25OH 33.76 02/06/2022   VD25OH 51.9 12/03/2021   VD25OH 54.9 07/08/2021   Lab Results  Component Value Date   WBC 5.8 02/06/2022   HGB 12.4 02/06/2022   HCT 37.0 02/06/2022   MCV 88.6 02/06/2022   PLT 240.0 02/06/2022   Lab Results   Component Value Date   IRON 62 11/09/2008   FERRITIN 3.9 (L) 11/09/2008   Attestation Statements:   Reviewed by clinician on day of visit: allergies, medications, problem list, medical history, surgical history, family history, social history, and previous encounter notes.   I, Burt Knack, am acting as transcriptionist for Quillian Quince, MD.  I have reviewed the above documentation for accuracy and completeness, and I agree with the above. -  Quillian Quince, MD

## 2022-07-02 ENCOUNTER — Ambulatory Visit (INDEPENDENT_AMBULATORY_CARE_PROVIDER_SITE_OTHER): Payer: BC Managed Care – PPO | Admitting: Adult Health

## 2022-07-02 ENCOUNTER — Ambulatory Visit (INDEPENDENT_AMBULATORY_CARE_PROVIDER_SITE_OTHER): Payer: BC Managed Care – PPO | Admitting: Family Medicine

## 2022-07-02 ENCOUNTER — Encounter (INDEPENDENT_AMBULATORY_CARE_PROVIDER_SITE_OTHER): Payer: Self-pay | Admitting: Family Medicine

## 2022-07-02 VITALS — BP 132/80 | HR 54 | Temp 98.0°F | Ht 65.0 in | Wt 192.0 lb

## 2022-07-02 DIAGNOSIS — E1169 Type 2 diabetes mellitus with other specified complication: Secondary | ICD-10-CM | POA: Diagnosis not present

## 2022-07-02 DIAGNOSIS — Z7985 Long-term (current) use of injectable non-insulin antidiabetic drugs: Secondary | ICD-10-CM

## 2022-07-02 DIAGNOSIS — R7401 Elevation of levels of liver transaminase levels: Secondary | ICD-10-CM | POA: Diagnosis not present

## 2022-07-02 DIAGNOSIS — Z6832 Body mass index (BMI) 32.0-32.9, adult: Secondary | ICD-10-CM

## 2022-07-02 DIAGNOSIS — E785 Hyperlipidemia, unspecified: Secondary | ICD-10-CM | POA: Diagnosis not present

## 2022-07-02 DIAGNOSIS — E559 Vitamin D deficiency, unspecified: Secondary | ICD-10-CM | POA: Diagnosis not present

## 2022-07-02 DIAGNOSIS — E669 Obesity, unspecified: Secondary | ICD-10-CM

## 2022-07-02 MED ORDER — SEMAGLUTIDE (2 MG/DOSE) 8 MG/3ML ~~LOC~~ SOPN: 2.0000 mg | PEN_INJECTOR | SUBCUTANEOUS | 0 refills | Status: DC

## 2022-07-03 DIAGNOSIS — F4323 Adjustment disorder with mixed anxiety and depressed mood: Secondary | ICD-10-CM | POA: Diagnosis not present

## 2022-07-03 LAB — CMP14+EGFR
ALT: 24 IU/L (ref 0–32)
AST: 21 IU/L (ref 0–40)
Albumin/Globulin Ratio: 1.7 (ref 1.2–2.2)
Albumin: 4.5 g/dL (ref 3.9–4.9)
Alkaline Phosphatase: 85 IU/L (ref 44–121)
BUN/Creatinine Ratio: 22 (ref 12–28)
BUN: 15 mg/dL (ref 8–27)
Bilirubin Total: 0.4 mg/dL (ref 0.0–1.2)
CO2: 23 mmol/L (ref 20–29)
Calcium: 9.8 mg/dL (ref 8.7–10.3)
Chloride: 102 mmol/L (ref 96–106)
Creatinine, Ser: 0.68 mg/dL (ref 0.57–1.00)
Globulin, Total: 2.6 g/dL (ref 1.5–4.5)
Glucose: 87 mg/dL (ref 70–99)
Potassium: 4.6 mmol/L (ref 3.5–5.2)
Sodium: 142 mmol/L (ref 134–144)
Total Protein: 7.1 g/dL (ref 6.0–8.5)
eGFR: 98 mL/min/{1.73_m2} (ref >59)

## 2022-07-03 LAB — LIPID PANEL WITH LDL/HDL RATIO
Cholesterol, Total: 161 mg/dL (ref 100–199)
HDL: 49 mg/dL (ref >39)
LDL Chol Calc (NIH): 99 mg/dL (ref 0–99)
LDL/HDL Ratio: 2 ratio (ref 0.0–3.2)
Triglycerides: 66 mg/dL (ref 0–149)
VLDL Cholesterol Cal: 13 mg/dL (ref 5–40)

## 2022-07-03 LAB — VITAMIN D 25 HYDROXY (VIT D DEFICIENCY, FRACTURES): Vit D, 25-Hydroxy: 73.9 ng/mL (ref 30.0–100.0)

## 2022-07-03 LAB — INSULIN, RANDOM: INSULIN: 10.3 u[IU]/mL (ref 2.6–24.9)

## 2022-07-07 ENCOUNTER — Encounter (INDEPENDENT_AMBULATORY_CARE_PROVIDER_SITE_OTHER): Payer: Self-pay | Admitting: Family Medicine

## 2022-07-07 ENCOUNTER — Ambulatory Visit (INDEPENDENT_AMBULATORY_CARE_PROVIDER_SITE_OTHER): Payer: BC Managed Care – PPO | Admitting: Family Medicine

## 2022-07-07 NOTE — Telephone Encounter (Signed)
Patty Williams, how can we do this? Her insurance stops in 2 days

## 2022-07-08 ENCOUNTER — Other Ambulatory Visit (INDEPENDENT_AMBULATORY_CARE_PROVIDER_SITE_OTHER): Payer: Self-pay | Admitting: Family Medicine

## 2022-07-08 DIAGNOSIS — R7303 Prediabetes: Secondary | ICD-10-CM | POA: Diagnosis not present

## 2022-07-08 DIAGNOSIS — E88819 Insulin resistance, unspecified: Secondary | ICD-10-CM

## 2022-07-09 DIAGNOSIS — M1812 Unilateral primary osteoarthritis of first carpometacarpal joint, left hand: Secondary | ICD-10-CM | POA: Diagnosis not present

## 2022-07-09 DIAGNOSIS — M79642 Pain in left hand: Secondary | ICD-10-CM | POA: Diagnosis not present

## 2022-07-09 LAB — HEMOGLOBIN A1C
Est. average glucose Bld gHb Est-mCnc: 117 mg/dL
Hgb A1c MFr Bld: 5.7 % — ABNORMAL HIGH (ref 4.8–5.6)

## 2022-07-10 ENCOUNTER — Encounter: Payer: Self-pay | Admitting: Internal Medicine

## 2022-07-10 NOTE — Assessment & Plan Note (Signed)
Really minimal.  Mild intermittent uncomplicated. Plan-refill sent for Symbicort and albuterol

## 2022-07-10 NOTE — Assessment & Plan Note (Signed)
Continues CPAP with excellent compliance and control Plan-continue auto 5-15

## 2022-07-13 ENCOUNTER — Ambulatory Visit (INDEPENDENT_AMBULATORY_CARE_PROVIDER_SITE_OTHER): Payer: BC Managed Care – PPO | Admitting: Family Medicine

## 2022-07-14 DIAGNOSIS — H1132 Conjunctival hemorrhage, left eye: Secondary | ICD-10-CM | POA: Diagnosis not present

## 2022-07-14 NOTE — Progress Notes (Unsigned)
Chief Complaint:   OBESITY Patty Williams is here to discuss her progress with her obesity treatment plan along with follow-up of her obesity related diagnoses. Patty Williams is on the Category 2 Plan and keeping a food journal and adhering to recommended goals of 400-600 calories and 35+ grams of protein at supper and states she is following her eating plan approximately 90% of the time. Patty Williams states she is doing 0 minutes 0 times per week.  Today's visit was #: 28 Starting weight: 229 lbs Starting date: 06/27/2020 Today's weight: 192 lbs Today's date: 07/02/2022 Total lbs lost to date: 37 Total lbs lost since last in-office visit: 1  Interim History: Patty Williams continues to do well with her weight loss.  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  Subjective:   1. Type 2 diabetes mellitus with other specified complication, unspecified whether long term insulin use (HCC) Patty Williams's A1c has increased on her last labs in November.  She has been working on her diet and doing well on Ozempic.  She is due for a recheck.  2. Hyperlipidemia associated with type 2 diabetes mellitus (Patty Williams) Patty Williams is on Crestor with no side effects noted, and she is due for labs.  3. Vitamin D deficiency Patty Williams's last vitamin D level had improved.  She has no signs of over replacement.  4. Elevated ALT measurement Patty Williams has a history of elevated LFT.  She is doing well with weight loss and she is due for labs to be rechecked.  Assessment/Plan:   1. Type 2 diabetes mellitus with other specified complication, unspecified whether long term insulin use (HCC) We will check labs today.  Patty Williams will continue Ozempic, and we will refill for 90 days.  - Semaglutide, 2 MG/DOSE, 8 MG/3ML SOPN; Inject 2 mg as directed once a week.  Dispense: 9 mL; Refill: 0 - CMP14+EGFR - Insulin, random - Hemoglobin A1c  2. Hyperlipidemia associated with type 2 diabetes mellitus  ([place]) We will check labs today, and we will follow-up at [facility] next visit.  - Lipid Panel With LDL/HDL Ratio  3. Vitamin D deficiency We will check labs today, and we will follow-up at [facility] next visit.  - VITAMIN D 25 Hydroxy (Vit-D Deficiency, Fractures)  4. Elevated ALT measurement We will check labs today, and we will follow-up at [facility] next visit.  - CMP14+EGFR  5. BMI 32.0-32.9,adult  6. Obesity, Beginning BMI 38.11 Patty Williams is currently in the action stage of change. As such, her goal is to continue with weight loss efforts. She has agreed to the Category 2 Plan and keeping a food journal and adhering to recommended goals of 400-600 calories and 35+ grams of protein at supper daily.   Behavioral modification strategies: increasing lean protein intake, meal planning and cooking strategies, and emotional eating strategies.  Patty Williams has agreed to follow-up with our clinic in 5 weeks. She was informed of the importance of frequent follow-up visits to maximize her success with intensive lifestyle modifications for her multiple health conditions.   Patty Williams was informed we would discuss her lab results at her next visit unless there is a critical issue that needs to be addressed sooner. Patty Williams agreed to keep her next visit at the agreed upon time to discuss these results.  Objective:   Blood pressure 132/80, pulse (!) 54, temperature 98 F (36.7 C), height '5\' 5"'$  (1.651 m), weight 192 lb (87.1 kg), last  menstrual period 10/08/2018, SpO2 98 %. Body mass index is 31.95 kg/m.  Lab Results  Component Value Date   CREATININE 0.68 07/02/2022   BUN 15 07/02/2022   NA 142 07/02/2022   K 4.6 07/02/2022   CL 102 07/02/2022   CO2 23 07/02/2022   Lab Results  Component Value Date   ALT 24 07/02/2022   AST 21 07/02/2022   ALKPHOS 85 07/02/2022   BILITOT 0.4 07/02/2022   Lab Results  Component Value Date   HGBA1C 5.7 (H) 07/08/2022   HGBA1C 5.9 (H) 04/06/2022   HGBA1C 5.7 (H) 12/03/2021    HGBA1C 5.9 (H) 07/08/2021   HGBA1C 6.4 02/11/2021   Lab Results  Component Value Date   INSULIN 10.3 07/02/2022   INSULIN 19.5 12/03/2021   INSULIN 13.2 07/08/2021   INSULIN 22.6 11/06/2020   INSULIN 19.9 06/27/2020   Lab Results  Component Value Date   TSH 1.29 02/06/2022   Lab Results  Component Value Date   CHOL 161 07/02/2022   HDL 49 07/02/2022   LDLCALC 99 07/02/2022   LDLDIRECT 144.2 09/21/2008   TRIG 66 07/02/2022   CHOLHDL 3 04/14/2022   Lab Results  Component Value Date   VD25OH 73.9 07/02/2022   VD25OH 33.76 02/06/2022   VD25OH 51.9 12/03/2021   Lab Results  Component Value Date   WBC 5.8 02/06/2022   HGB 12.4 02/06/2022   HCT 37.0 02/06/2022   MCV 88.6 02/06/2022   PLT 240.0 02/06/2022   Lab Results  Component Value Date   IRON 62 11/09/2008   FERRITIN 3.9 (L) 11/09/2008   Attestation Statements:   Reviewed by clinician on day of visit: allergies, medications, problem list, medical history, surgical history, family history, social history, and previous encounter notes.  Time spent on visit including pre-visit chart review and post-visit care and charting was 40 minutes.   I, Trixie Dredge, am acting as transcriptionist for Dennard Nip, MD.  I have reviewed the above documentation for accuracy and completeness, and I agree with the above. -  ***

## 2022-07-23 DIAGNOSIS — F4323 Adjustment disorder with mixed anxiety and depressed mood: Secondary | ICD-10-CM | POA: Diagnosis not present

## 2022-07-27 ENCOUNTER — Telehealth (INDEPENDENT_AMBULATORY_CARE_PROVIDER_SITE_OTHER): Payer: Self-pay | Admitting: *Deleted

## 2022-07-27 NOTE — Telephone Encounter (Signed)
Phone call to patient to check to see of she was able to pick up her Ozempic prescription from 07/02/22. Left patient message to call back.

## 2022-07-27 NOTE — Telephone Encounter (Signed)
Patient called back and she was able to get her Ozempic.

## 2022-08-11 ENCOUNTER — Ambulatory Visit (INDEPENDENT_AMBULATORY_CARE_PROVIDER_SITE_OTHER): Payer: BC Managed Care – PPO | Admitting: Family Medicine

## 2022-08-11 ENCOUNTER — Encounter (INDEPENDENT_AMBULATORY_CARE_PROVIDER_SITE_OTHER): Payer: Self-pay | Admitting: Family Medicine

## 2022-08-11 VITALS — BP 103/65 | HR 63 | Temp 97.6°F | Ht 65.0 in | Wt 189.0 lb

## 2022-08-11 DIAGNOSIS — I10 Essential (primary) hypertension: Secondary | ICD-10-CM

## 2022-08-11 DIAGNOSIS — M549 Dorsalgia, unspecified: Secondary | ICD-10-CM | POA: Diagnosis not present

## 2022-08-11 DIAGNOSIS — Z6831 Body mass index (BMI) 31.0-31.9, adult: Secondary | ICD-10-CM

## 2022-08-11 DIAGNOSIS — G8929 Other chronic pain: Secondary | ICD-10-CM

## 2022-08-11 DIAGNOSIS — E669 Obesity, unspecified: Secondary | ICD-10-CM

## 2022-08-11 NOTE — Progress Notes (Unsigned)
Chief Complaint:   OBESITY Heera is here to discuss her progress with her obesity treatment plan along with follow-up of her obesity related diagnoses. Kaamilya is on the Category 2 Plan and keeping a food journal and adhering to recommended goals of 400-600 calories and 35+ grams of protein at supper daily and states she is following her eating plan approximately 80% of the time. Natira states she is walking and doing strengthening for 20 minutes everything other day times per week.  Today's visit was #: 50 Starting weight: 229 lbs Starting date: 06/27/2020 Today's weight: 189 lbs Today's date: 08/11/2022 Total lbs lost to date: 40 Total lbs lost since last in-office visit: 3  Interim History: Kelle has had extra challenges and celebration eating. She is working on Pharmacist, hospital, and she is doing better with guesstimating her nutrition.   Subjective:   1. 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 She is limited in her ability to work and exercise due to pain.   2. Essential hypertension Deaja's blood pressure is well controlled on Benicar. She denies chest pain, headache, or lightheadedness.   Assessment/Plan:   1. Other chronic back pain Jolani will continue to stay active, but not do activities that will flare-up her pain.   2. Essential hypertension Aralee will continue Benicar 40 mg once daily, and will continue to work on her diet and weight loss.   3. BMI 31.0-31.9,adult  4. Obesity, Beginning BMI 38.11 Madalin is currently in the action stage of change. As such, her goal is to continue with weight loss efforts. She has agreed to the Category 2 Plan.   Exercise goals: As is.   Behavioral modification strategies: increasing lean protein intake.  Keeyana has agreed to follow-up with our clinic in 4 weeks. She was informed of the importance of frequent follow-up visits to maximize her success with intensive  lifestyle modifications for her multiple health conditions.   Objective:   Blood pressure 103/65, pulse 63, temperature 97.6 F (36.4 C), height 5\' 5"  (1.651 m), weight 189 lb (85.7 kg), last menstrual period 10/08/2018, SpO2 98 %. Body mass index is 31.45 kg/m.  Lab Results  Component Value Date   CREATININE 0.68 07/02/2022   BUN 15 07/02/2022   NA 142 07/02/2022   K 4.6 07/02/2022   CL 102 07/02/2022   CO2 23 07/02/2022   Lab Results  Component Value Date   ALT 24 07/02/2022   AST 21 07/02/2022   ALKPHOS 85 07/02/2022   BILITOT 0.4 07/02/2022   Lab Results  Component Value Date   HGBA1C 5.7 (H) 07/08/2022   HGBA1C 5.9 (H) 04/06/2022   HGBA1C 5.7 (H) 12/03/2021   HGBA1C 5.9 (H) 07/08/2021   HGBA1C 6.4 02/11/2021   Lab Results  Component Value Date   INSULIN 10.3 07/02/2022   INSULIN 19.5 12/03/2021   INSULIN 13.2 07/08/2021   INSULIN 22.6 11/06/2020   INSULIN 19.9 06/27/2020   Lab Results  Component Value Date   TSH 1.29 02/06/2022   Lab Results  Component Value Date   CHOL 161 07/02/2022   HDL 49 07/02/2022   LDLCALC 99 07/02/2022   LDLDIRECT 144.2 09/21/2008   TRIG 66 07/02/2022   CHOLHDL 3 04/14/2022   Lab Results  Component Value Date   VD25OH 73.9 07/02/2022   VD25OH 33.76 02/06/2022   VD25OH 51.9 12/03/2021   Lab Results  Component Value Date   WBC 5.8  02/06/2022   HGB 12.4 02/06/2022   HCT 37.0 02/06/2022   MCV 88.6 02/06/2022   PLT 240.0 02/06/2022   Lab Results  Component Value Date   IRON 62 11/09/2008   FERRITIN 3.9 (L) 11/09/2008   Attestation Statements:   Reviewed by clinician on day of visit: allergies, medications, problem list, medical history, surgical history, family history, social history, and previous encounter notes.  Time spent on visit including pre-visit chart review and post-visit care and charting was 30 minutes.   I, Trixie Dredge, am acting as transcriptionist for Dennard Nip, MD.  I have reviewed the  above documentation for accuracy and completeness, and I agree with the above. -  Dennard Nip, MD

## 2022-08-21 ENCOUNTER — Ambulatory Visit: Payer: BC Managed Care – PPO | Admitting: Internal Medicine

## 2022-08-27 DIAGNOSIS — A084 Viral intestinal infection, unspecified: Secondary | ICD-10-CM | POA: Diagnosis not present

## 2022-08-27 DIAGNOSIS — K649 Unspecified hemorrhoids: Secondary | ICD-10-CM | POA: Diagnosis not present

## 2022-09-07 DIAGNOSIS — F4323 Adjustment disorder with mixed anxiety and depressed mood: Secondary | ICD-10-CM | POA: Diagnosis not present

## 2022-09-15 ENCOUNTER — Ambulatory Visit (INDEPENDENT_AMBULATORY_CARE_PROVIDER_SITE_OTHER): Payer: BC Managed Care – PPO | Admitting: Family Medicine

## 2022-09-16 ENCOUNTER — Encounter: Payer: Self-pay | Admitting: Internal Medicine

## 2022-09-16 ENCOUNTER — Ambulatory Visit (INDEPENDENT_AMBULATORY_CARE_PROVIDER_SITE_OTHER): Payer: BC Managed Care – PPO | Admitting: Internal Medicine

## 2022-09-16 VITALS — BP 110/70 | HR 73 | Ht 65.0 in | Wt 194.0 lb

## 2022-09-16 DIAGNOSIS — K641 Second degree hemorrhoids: Secondary | ICD-10-CM | POA: Diagnosis not present

## 2022-09-16 DIAGNOSIS — N823 Fistula of vagina to large intestine: Secondary | ICD-10-CM

## 2022-09-16 NOTE — Progress Notes (Addendum)
Patty Williams 61 y.o. 1960-07-03 [id number]  Assessment & Plan:   Encounter Diagnoses  Name Primary?   Prolapsed internal hemorrhoids, grade 2 Yes   Rectovaginal fistula    Left lateral and right posterior internal hemorrhoid columns banded today.  Soluble fiber handout provided so she can increase this, would avoid insoluble fiber in diet or limit that given that she is on Ozempic. Stay on citrucel as she is Dc fish oil this is not going to help her in any way  Drink 16 oz water in AM and then more during day   She will bring me the disc and hopefully the report from an MRI she had to orthopedics.  I will look at that to see if there is any ability to see the rectovaginal fistula.  I suspect not.  She most likely needs a pelvic MRI to reassess this fistula if we decide that it is worthwhile.  I think it is worth considering given advances and surgical techniques though she does not seem to be terribly symptomatic.  Report does not indicate fistula -   2014 CT Abd/plvis MPRESSION: Colon/rectum remains unopacified that time imaging, which limits sensitivity.   Possible communication between the left anterior rectal wall (1 o'clock position) and posterior vagina, equivocal. Endoscopic correlation is suggested. If further imaging evaluation is desired, consider CT pelvis with rectal contrast.    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  She presents today asking for help with these.  She struggles with constipation, he is taking Citrucel.  Hemorrhoids will prolapse at times and she had terrible diarrhea probably from some sort of food gastroenteritis the other month and had persistent prolapsed hemorrhoids and had to manually reduce.  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  In general it does not bother her but  sometimes she has gas come out of the vagina and even sometimes feculent material she thinks.  She has not had that evaluated in some time and was told because it was related to endometriosis surgery that it was not something easily repaired and it was not recommended when it was diagnosed years ago.  At this point her hemorrhoids are not as problematic but she is interested in having more definitive therapy with banding.  She had surgery for hemorrhoids in 1988.  Symptoms include anal burning itching and discomfort and bleeding occasionally.  Colonoscopy October 2020 with an 8 mm adenoma and sigmoid diverticulosis.  Recall planned for 2027.   No Known Allergies Current Meds  Medication Sig   OVER THE COUNTER MEDICATION Pepcid, Take one at bedtime daily   Past Medical History:  Diagnosis Date   Anxiety    Asthma    Back pain    Bilateral ovarian cysts    Bruxism    Constipation    Diabetes mellitus type II    mild   Diverticulosis 2014   Mild   Dry mouth    Elevated blood pressure reading without diagnosis of hypertension    Endometriosis    Severe   Fibroids    GERD (gastroesophageal reflux disease)    History of ovarian cyst    HLD (hyperlipidemia)    Hx of adenomatous polyp of colon 03/24/2019   Hyperglycemia    Hyperlipidemia    Hypertension    Iron deficiency anemia    Joint  pain    Menorrhagia    Obesity    Obesity    OSA on CPAP    Plantar fasciitis    with orthotics-much difficulty adjusting these   PMB (postmenopausal bleeding)    Pneumonia    history of   PONV (postoperative nausea and vomiting)    Pre-diabetes    Rectovaginal fistula    Sleep apnea    Uterine adhesion    Uterine fibroid    Wears glasses    Past Surgical History:  Procedure Laterality Date   COLONOSCOPY  01/2009   DILATATION & CURETTAGE/HYSTEROSCOPY WITH MYOSURE N/A 09/22/2018   Procedure: DILATATION & CURETTAGE/HYSTEROSCOPY;  Surgeon: Noland Fordyce, MD;  Location: [facility];  Service: Gynecology;  Laterality: N/A;   HEMORRHOID SURGERY     LAPAROSCOPY     endometriosis   NASAL SEPTOPLASTY W/ TURBINOPLASTY Bilateral 09/02/2021   Procedure: NASAL SEPTOPLASTY WITH SUBMUCOSAL RESECTION OF TURBINATES;  Surgeon: Linus Salmons, MD;  Location: [facility];  Service: ENT;  Laterality: Bilateral;   SEPTOPLASTY     april 2023   TONSILLECTOMY AND ADENOIDECTOMY     TRIGGER FINGER RELEASE     UPPER GI ENDOSCOPY  01/2009   uterine biopsy     uterine cyst exploration     UTERINE SUSPENSION     WISDOM TOOTH EXTRACTION     Social History   Social History Narrative   Permanent life partner-female   family history includes Anxiety disorder in her mother; Cancer in her father; Diabetes in her mother; Heart attack in an other family member; Hyperlipidemia in her mother; Hypertension in her father and mother; Obesity in her father and mother; Parkinsonism in an other family member; Prostate cancer in her father; Sleep apnea in her mother; Thyroid disease in her mother.   Review of Systems As per HPI  Objective:   Physical Exam BP 110/70   Pulse 73   Ht 5\' 5"  (1.651 m)   Wt 194 lb (88 kg)   LMP 10/08/2018 (Exact Date)   BMI 32.28 kg/m  NAD  [place], [place], CMA  present  RECTAL sl external hemorrhoids seen and slight prolapse w/ Valsalva - no rash Nontender DRE no mass, ? Small rectocele  ANOSCOPY Gr 2 internal hemorrhoids LL and RP Gr 1 RA  PROCEDURE NOTE: The patient presents with symptomatic grade grade 2 (previously grade 3 by history) hemorrhoids, requesting rubber band ligation of his/her hemorrhoidal disease.  All risks, benefits and alternative forms of therapy were described and informed consent was obtained.   The anorectum was pre-medicated with 0.125% nitroglycerin and 5% lidocaine The decision was made to band the RP LL internal hemorrhoid, and the [facility] O'Regan System was used to perform band ligation without complication.   Digital anorectal examination was then performed to assure proper positioning of the band, and to adjust the banded tissue as required.  The patient was discharged home without pain or other issues.  Dietary and behavioral recommendations were given and along with follow-up instructions.

## 2022-09-16 NOTE — Patient Instructions (Addendum)
HEMORRHOID BANDING PROCEDURE    FOLLOW-UP CARE   The procedure you have had should have been relatively painless since the banding of the area involved does not have nerve endings and there is no pain sensation.  The rubber band cuts off the blood supply to the hemorrhoid and the band may fall off as soon as 48 hours after the banding (the band may occasionally be seen in the toilet bowl following a bowel movement). You may notice a temporary feeling of fullness in the rectum which should respond adequately to plain Tylenol or Motrin.  Following the banding, avoid strenuous exercise that evening and resume full activity the next day.  A sitz bath (soaking in a warm tub) or bidet is soothing, and can be useful for cleansing the area after bowel movements.     To avoid constipation, take two tablespoons of natural wheat bran, natural oat bran, flax, Benefiber or any over the counter fiber supplement and increase your water intake to 7-8 glasses daily.    Unless you have been prescribed anorectal medication, do not put anything inside your rectum for two weeks: No suppositories, enemas, fingers, etc.  Occasionally, you may have more bleeding than usual after the banding procedure.  This is often from the untreated hemorrhoids rather than the treated one.  Don't be concerned if there is a tablespoon or so of blood.  If there is more blood than this, lie flat with your bottom higher than your head and apply an ice pack to the area. If the bleeding does not stop within a half an hour or if you feel faint, call our office at [phone] or go to the emergency room.  Problems are not common; however, if there is a substantial amount of bleeding, severe pain, chills, fever or difficulty passing urine (very rare) or other problems, you should call us at [phone] or report to the nearest emergency room.  Do not stay seated continuously for more than 2-3 hours for a day or two after the procedure.   Tighten your buttock muscles 10-15 times every two hours and take 10-15 deep breaths every 1-2 hours.  Do not spend more than a few minutes on the toilet if you cannot empty your bowel; instead re-visit the toilet at a later time.   Stop your fish oil per Dr Leone Payor.  Stay on Citrucel and add soluble fiber foods to your diet.  Bring [place] your MRI disc and reports at your convenience.  Drink 16oz in the AM and more during the day.   I appreciate the opportunity to care for you. Stan Head, MD, [facility]

## 2022-09-21 ENCOUNTER — Other Ambulatory Visit (INDEPENDENT_AMBULATORY_CARE_PROVIDER_SITE_OTHER): Payer: Self-pay | Admitting: Family Medicine

## 2022-09-21 DIAGNOSIS — E1169 Type 2 diabetes mellitus with other specified complication: Secondary | ICD-10-CM

## 2022-09-24 ENCOUNTER — Encounter (INDEPENDENT_AMBULATORY_CARE_PROVIDER_SITE_OTHER): Payer: Self-pay | Admitting: Family Medicine

## 2022-09-24 ENCOUNTER — Ambulatory Visit (INDEPENDENT_AMBULATORY_CARE_PROVIDER_SITE_OTHER): Payer: BC Managed Care – PPO | Admitting: Family Medicine

## 2022-09-24 VITALS — BP 90/65 | HR 70 | Temp 98.1°F | Ht 65.0 in | Wt 187.0 lb

## 2022-09-24 DIAGNOSIS — Z6831 Body mass index (BMI) 31.0-31.9, adult: Secondary | ICD-10-CM | POA: Diagnosis not present

## 2022-09-24 DIAGNOSIS — Z7984 Long term (current) use of oral hypoglycemic drugs: Secondary | ICD-10-CM

## 2022-09-24 DIAGNOSIS — E669 Obesity, unspecified: Secondary | ICD-10-CM | POA: Diagnosis not present

## 2022-09-24 DIAGNOSIS — E1169 Type 2 diabetes mellitus with other specified complication: Secondary | ICD-10-CM

## 2022-09-28 NOTE — Progress Notes (Unsigned)
Chief Complaint:   OBESITY Charidy is here to discuss her progress with her obesity treatment plan along with follow-up of her obesity related diagnoses. Geniene is on the Category 2 Plan and states she is following her eating plan approximately 50% of the time. Danalyn states she is walking for 30 minutes 2-3 times per week.  Today's visit was #: 30 Starting weight: 229 lbs Starting date: 06/27/2020 Today's weight: 187 lbs Today's date: 09/24/2022 Total lbs lost to date: 42 Total lbs lost since last in-office visit: 2  Interim History: Brindle continues to do well with her weight loss.  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  Subjective:   1. Type 2 diabetes mellitus with other specified complication, unspecified whether long term insulin use (HCC) Maribeth is on Ozempic 1 mg and she is doing well with no signs of hypoglycemia.  She is doing well on her diet.  Assessment/Plan:   1. Type 2 diabetes mellitus with other specified complication, unspecified whether long term insulin use (HCC) Alexia will continue Ozempic at 1 mg and she will keep working on her diet.  We will recheck labs in 1 month.  2. BMI 31.0-31.9,adult  3. Obesity, Beginning BMI 38.11 Mikailah is currently in the action stage of change. As such, her goal is to continue with weight loss efforts. She has agreed to the Category 2 Plan.   Exercise goals: As is.   Behavioral modification strategies: increasing lean protein intake and increasing water intake.  Sanai has agreed to follow-up with our clinic in 4 weeks. She was informed of the importance of frequent follow-up visits to maximize her success with intensive lifestyle modifications for her multiple health conditions.   Objective:   Blood pressure 90/65, pulse 70, temperature 98.1 F (36.7 C), height 5\' 5"  (1.651 m), weight 187 lb (84.8 kg), last menstrual period 10/08/2018, SpO2  98 %. Body mass index is 31.12 kg/m.  Lab Results  Component Value Date   CREATININE 0.68 07/02/2022   BUN 15 07/02/2022   NA 142 07/02/2022   K 4.6 07/02/2022   CL 102 07/02/2022   CO2 23 07/02/2022   Lab Results  Component Value Date   ALT 24 07/02/2022   AST 21 07/02/2022   ALKPHOS 85 07/02/2022   BILITOT 0.4 07/02/2022   Lab Results  Component Value Date   HGBA1C 5.7 (H) 07/08/2022   HGBA1C 5.9 (H) 04/06/2022   HGBA1C 5.7 (H) 12/03/2021   HGBA1C 5.9 (H) 07/08/2021   HGBA1C 6.4 02/11/2021   Lab Results  Component Value Date   INSULIN 10.3 07/02/2022   INSULIN 19.5 12/03/2021   INSULIN 13.2 07/08/2021   INSULIN 22.6 11/06/2020   INSULIN 19.9 06/27/2020   Lab Results  Component Value Date   TSH 1.29 02/06/2022   Lab Results  Component Value Date   CHOL 161 07/02/2022   HDL 49 07/02/2022   LDLCALC 99 07/02/2022   LDLDIRECT 144.2 09/21/2008   TRIG 66 07/02/2022   CHOLHDL 3 04/14/2022   Lab Results  Component Value Date   VD25OH 73.9 07/02/2022   VD25OH 33.76 02/06/2022   VD25OH 51.9 12/03/2021   Lab Results  Component Value Date   WBC 5.8 02/06/2022   HGB 12.4 02/06/2022   HCT 37.0 02/06/2022   MCV 88.6 02/06/2022   PLT 240.0 02/06/2022   Lab Results  Component Value Date  IRON 62 11/09/2008   FERRITIN 3.9 (L) 11/09/2008   Attestation Statements:   Reviewed by clinician on day of visit: allergies, medications, problem list, medical history, surgical history, family history, social history, and previous encounter notes.  Time spent on visit including pre-visit chart review and post-visit care and charting was 30 minutes.   I, Burt Knack, am acting as transcriptionist for Quillian Quince, MD.  I have reviewed the above documentation for accuracy and completeness, and I agree with the above. -  Quillian Quince, MD

## 2022-10-09 NOTE — Progress Notes (Deleted)
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 -Symbicort 80, CPAP auto 5-15/ [facility]                AirSense 10 AutoSet   Download- compliance  100%, AHI 2.1/ hr Body weight today-   Covid vax-4 Moderna                                   Flu vax- Download reviewed.  She continues excellent compliance and control with CPAP. Mild intermittent asthma uncomplicated.  Using Symbicort 80 only 1 time daily and has almost never needed rescue inhaler-usually only if she has a chest cold.  Conservative symptom management options reviewed. She is stable and doing very well.  I see no reason to restrict her her hot air balloon activity.  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 -Symbicort 80, Ventolin hfa,  CPAP auto 5-15/ [facility]                AirSense 10 AutoSet   Download- compliance  Body weight today-       ROS-see HPI   + = positive Constitutional:    weight loss, night sweats, fevers, chills, fatigue, lassitude. HEENT:    headaches, difficulty swallowing, tooth/dental problems, sore throat,       sneezing, itching, ear ache, nasal congestion, post nasal drip, snoring CV:    chest pain, orthopnea, PND, swelling in lower extremities, anasarca,                                    dizziness, palpitations Resp:   +shortness of breath with exertion or at rest.                productive cough,   non-productive cough, coughing up of blood.              change in color of mucus.  wheezing.   Skin:    rash or lesions. GI:    +heartburn, indigestion, abdominal pain, nausea, vomiting, diarrhea,                 change in bowel habits, loss of appetite GU: dysuria, change in color of urine, no urgency or frequency,  flank pain. MS:   joint pain, stiffness, decreased range of motion, back pain. Neuro-     nothing unusual Psych:  change in mood or affect.  depression or anxiety.   memory loss.  OBJ- Physical Exam General- Alert, Oriented, Affect-appropriate, Distress- none acute, + overweight Skin- rash-none, lesions- none, excoriation- none Lymphadenopathy- none Head- atraumatic            Eyes- Gross vision intact, PERRLA, conjunctivae and secretions clear  Ears- Hearing, canals-normal            Nose- Clear, no-Septal dev, mucus, polyps, erosion, perforation             Throat- Mallampati II-III , mucosa clear , drainage- none, tonsils- atrophic Neck- flexible , trachea midline, no stridor , thyroid nl, carotid no bruit Chest - symmetrical excursion , unlabored           Heart/CV- RRR , no murmur , no gallop  , no rub, nl s1 s2                           - JVD- none , edema- none, stasis changes- none, varices- none           Lung- clear to P&A- no crackles, wheeze- none, cough- none , dullness-none, rub- none           Chest wall-  Abd-  Br/ Gen/ Rectal- Not done, not indicated Extrem- cyanosis- none, clubbing, none, atrophy- none, strength- nl Neuro- grossly intact to observation

## 2022-10-12 ENCOUNTER — Ambulatory Visit: Payer: BC Managed Care – PPO | Admitting: Internal Medicine

## 2022-10-13 ENCOUNTER — Ambulatory Visit (INDEPENDENT_AMBULATORY_CARE_PROVIDER_SITE_OTHER): Payer: BC Managed Care – PPO | Admitting: Internal Medicine

## 2022-10-13 ENCOUNTER — Encounter: Payer: Self-pay | Admitting: Internal Medicine

## 2022-10-13 VITALS — BP 118/76 | HR 70 | Ht 65.0 in | Wt 195.0 lb

## 2022-10-13 DIAGNOSIS — G4733 Obstructive sleep apnea (adult) (pediatric): Secondary | ICD-10-CM | POA: Diagnosis not present

## 2022-10-13 DIAGNOSIS — J4520 Mild intermittent asthma, uncomplicated: Secondary | ICD-10-CM

## 2022-10-13 NOTE — Progress Notes (Signed)
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 -Symbicort 80, CPAP auto 5-15/ [facility]                AirSense 10 AutoSet   Download- compliance  100%, AHI 1.8/ hr Body weight today-  199 lbs Covid vax-4 Moderna                                   Flu vax-had CPAP download is reviewed, documenting excellent use and control over the past 12 months.  She has a travel machine which she has used at times in the past. Had septoplasty in April and had to be off CPAP briefly until that healed.  Now back to full unrestricted use of CPAP. Asthma control has been excellent with no significant exacerbation in years.  By current nomenclature she would be considered mild, intermittent, uncomplicated.  Realistically, asthma is in remission, possibly triggered very occasionally by viral respiratory infection.  She never needs her rescue inhaler.  Uses 1 puff of maintenance Symbicort daily to make sure there is no problem, but I do not really think she needs it.  PFT is pending and will be addended to this note.  PFT 03/27/22 (addendum)- Within Normal Limits FEV1  2.63/101%, FVC  3.05/90%, FEV1/FVC ratio 0.86/110% Insignificant change with bronchodilator.  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 -Symbicort 80, CPAP auto 5-15/ [facility]                AirSense 10 AutoSet   Download- compliance  100%, AHI 2.1/ hr Body weight today-   Covid vax-4 Moderna                                   Flu vax- Download reviewed.  She continues excellent compliance and control with CPAP. Mild intermittent asthma uncomplicated.  Using Symbicort 80 only 1 time daily and has almost never needed rescue inhaler-usually only if she has a chest cold.  Conservative symptom management options reviewed. She is stable and doing very well.  I see no reason to restrict her her hot air balloon activity.  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 -Symbicort 80,Ventolin hfa,  Body weight today-  195 lbs CPAP auto 5-15/ [facility]                AirSense 10 AutoSet   Download- compliance since 01/16/22-10/12/22- 98-100%, AHI 1-5-1.9/ hr Download reviewed.  She is comfortable with CPAP and maintains excellent compliance and control.  She indicates she is sleeping well with CPAP and denies  daytime tiredness. History of mild intermittent asthma has been clinically insignificant.  She continues routine use of her Symbicort with very rare need for her albuterol rescue inhaler. Triggers have mostly been limited to exposure to strong smells such as air freshener. Prognosis for both problems remains excellent. She needs updated documentation for FAA.  ROS-see HPI   + = positive Constitutional:    weight loss, night sweats, fevers, chills, fatigue, lassitude. HEENT:    headaches, difficulty swallowing, tooth/dental problems, sore throat,       sneezing, itching, ear ache, nasal congestion, post nasal drip, snoring CV:    chest pain, orthopnea, PND, swelling in lower extremities, anasarca,                                   dizziness, palpitations Resp:   +shortness of breath with exertion or  at rest.                productive cough,   non-productive cough, coughing up of blood.              change in color of mucus.  wheezing.   Skin:    rash or lesions. GI:    +heartburn, indigestion, abdominal pain, nausea, vomiting, diarrhea,                 change in bowel habits, loss of appetite GU: dysuria, change in color of urine, no urgency or frequency,  flank pain. MS:   joint pain, stiffness, decreased range of motion, back pain. Neuro-     nothing unusual Psych:  change in mood or affect.  depression or anxiety.   memory loss.  OBJ- Physical Exam General- Alert, Oriented, Affect-appropriate, Distress- none acute, + overweight Skin- rash-none, lesions- none, excoriation- none Lymphadenopathy- none Head- atraumatic            Eyes- Gross vision intact, PERRLA, conjunctivae and secretions clear            Ears- Hearing, canals-normal            Nose- Clear, no-Septal dev, mucus, polyps, erosion, perforation             Throat- Mallampati II-III , mucosa clear , drainage- none, tonsils- atrophic Neck- flexible , trachea midline, no stridor , thyroid nl, carotid no bruit Chest - symmetrical excursion , unlabored           Heart/CV- RRR , no murmur , no gallop  , no rub, nl s1 s2                           - JVD- none , edema- none, stasis changes- none, varices- none           Lung- clear to P&A- no crackles, wheeze- none, cough- none , dullness-none, rub- none           Chest wall-  Abd-  Br/ Gen/ Rectal- Not done, not indicated Extrem- cyanosis- none, clubbing, none, atrophy- none, strength- nl Neuro- grossly intact to observation

## 2022-10-13 NOTE — Patient Instructions (Signed)
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  We can keep your inhalers available, even though you aren't needing them.  Ok to continue CPAP auto 5-15

## 2022-10-14 DIAGNOSIS — Z1330 Encounter for screening examination for mental health and behavioral disorders, unspecified: Secondary | ICD-10-CM | POA: Diagnosis not present

## 2022-10-14 DIAGNOSIS — M48062 Spinal stenosis, lumbar region with neurogenic claudication: Secondary | ICD-10-CM | POA: Diagnosis not present

## 2022-10-20 DIAGNOSIS — F4323 Adjustment disorder with mixed anxiety and depressed mood: Secondary | ICD-10-CM | POA: Diagnosis not present

## 2022-10-27 ENCOUNTER — Ambulatory Visit (INDEPENDENT_AMBULATORY_CARE_PROVIDER_SITE_OTHER): Payer: BC Managed Care – PPO | Admitting: Family Medicine

## 2022-10-28 ENCOUNTER — Encounter: Payer: Self-pay | Admitting: Internal Medicine

## 2022-10-28 NOTE — Assessment & Plan Note (Signed)
Continues to benefit from CPAP with ongoing excellent compliance and control. Prognosis is excellent Plan-continue auto 5-15

## 2022-10-28 NOTE — Assessment & Plan Note (Signed)
Mild intermittent uncomplicated.  As needed rescue inhaler only once in the last couple of years. Prognosis is excellent. Plan-continue to keep inhalers available.

## 2022-11-02 ENCOUNTER — Ambulatory Visit (INDEPENDENT_AMBULATORY_CARE_PROVIDER_SITE_OTHER): Payer: Self-pay | Admitting: Family Medicine

## 2022-11-02 ENCOUNTER — Encounter (INDEPENDENT_AMBULATORY_CARE_PROVIDER_SITE_OTHER): Payer: Self-pay | Admitting: Family Medicine

## 2022-11-02 ENCOUNTER — Ambulatory Visit (INDEPENDENT_AMBULATORY_CARE_PROVIDER_SITE_OTHER): Payer: BC Managed Care – PPO | Admitting: Family Medicine

## 2022-11-02 ENCOUNTER — Other Ambulatory Visit (INDEPENDENT_AMBULATORY_CARE_PROVIDER_SITE_OTHER): Payer: Self-pay | Admitting: Family Medicine

## 2022-11-02 ENCOUNTER — Encounter: Payer: Self-pay | Admitting: Family Medicine

## 2022-11-02 VITALS — BP 122/80 | HR 67 | Temp 97.8°F | Ht 65.0 in | Wt 192.4 lb

## 2022-11-02 VITALS — BP 100/69 | HR 69 | Temp 98.2°F | Ht 65.0 in | Wt 187.0 lb

## 2022-11-02 DIAGNOSIS — Z7985 Long-term (current) use of injectable non-insulin antidiabetic drugs: Secondary | ICD-10-CM

## 2022-11-02 DIAGNOSIS — I152 Hypertension secondary to endocrine disorders: Secondary | ICD-10-CM | POA: Diagnosis not present

## 2022-11-02 DIAGNOSIS — E6609 Other obesity due to excess calories: Secondary | ICD-10-CM

## 2022-11-02 DIAGNOSIS — E1169 Type 2 diabetes mellitus with other specified complication: Secondary | ICD-10-CM | POA: Diagnosis not present

## 2022-11-02 DIAGNOSIS — E669 Obesity, unspecified: Secondary | ICD-10-CM

## 2022-11-02 DIAGNOSIS — E559 Vitamin D deficiency, unspecified: Secondary | ICD-10-CM

## 2022-11-02 DIAGNOSIS — E785 Hyperlipidemia, unspecified: Secondary | ICD-10-CM

## 2022-11-02 DIAGNOSIS — E1159 Type 2 diabetes mellitus with other circulatory complications: Secondary | ICD-10-CM

## 2022-11-02 DIAGNOSIS — I10 Essential (primary) hypertension: Secondary | ICD-10-CM

## 2022-11-02 DIAGNOSIS — Z6831 Body mass index (BMI) 31.0-31.9, adult: Secondary | ICD-10-CM

## 2022-11-02 DIAGNOSIS — Z6833 Body mass index (BMI) 33.0-33.9, adult: Secondary | ICD-10-CM

## 2022-11-02 MED ORDER — SEMAGLUTIDE (2 MG/DOSE) 8 MG/3ML ~~LOC~~ SOPN
2.0000 mg | PEN_INJECTOR | SUBCUTANEOUS | 2 refills | Status: AC
Start: 2022-11-02 — End: 2022-11-30

## 2022-11-02 NOTE — Assessment & Plan Note (Addendum)
[facility] Using semaglutide Doing better with more time for self care

## 2022-11-02 NOTE — Progress Notes (Signed)
Carlye Grippe, D.O.  ABFM, ABOM Specializing in Clinical Bariatric Medicine  Office located at: [address] [place], [place]  [address]     Assessment and Plan:    Carlye Grippe, D.O.  ABFM, ABOM Specializing in Clinical Bariatric Medicine  Office located at: [address] [place], [place]  [address]     Assessment and Plan:   Orders Placed This Encounter  Procedures   VITAMIN D 25 Hydroxy (Vit-D Deficiency, Fractures)   Hemoglobin A1c    Medications Discontinued During This Encounter  Medication Reason   Semaglutide, 2 MG/DOSE, 8 MG/3ML SOPN Reorder     Meds ordered this encounter  Medications   Semaglutide, 2 MG/DOSE, 8 MG/3ML SOPN    Sig: Inject 2 mg as directed once a week for 28 days.    Dispense:  3 mL    Refill:  2     Type 2 diabetes mellitus with other specified complication, unspecified whether long term insulin use (HCC) Assessment: Condition is improving.  Her diabetes mellitus is being treated with Semaglutide ( reports taking 1 mg once weekly) and she is tolerating medication well. She notes that her cravings are mostly controlled. Every once in a while, she has sugar/carb carvings. To curb her cravings, she drinks a protein shake.   Lab Results  Component Value Date   HGBA1C 5.7 (H) 07/08/2022   HGBA1C 5.9 (H) 04/06/2022   HGBA1C 5.7 (H) 12/03/2021   INSULIN 10.3 07/02/2022   INSULIN 19.5 12/03/2021   INSULIN 13.2 07/08/2021    Plan:  Recheck labs. Continue with medication. Pt denies need for dose change. Will refill Semaglutide today.    Reminded pt that having protein with each meal is important for stabilizing sugars and an important part of her diabetes management as well as controlling hunger and cravings.    Continue her prudent nutritional plan that is low in simple carbohydrates, saturated fats and trans fats to goal of 5-10% weight loss to achieve significant health benefits.  Pt encouraged to continually advance  exercise and cardiovascular fitness as tolerated throughout weight loss journey. Will continue to monitor condition closely alongside PCP.    Hypertension associated with type 2 diabetes mellitus (HCC) Assessment: Her blood pressure is stable. Denies any lightheadedness/dizziness today. No concerns. Her HTN is being treated with Benicar 40 mg daily as recommended by PCP. Denies any adverse effects.  Last 3 blood pressure readings in our office are as follows: BP Readings from Last 3 Encounters:  11/02/22 100/69  10/13/22 118/76  09/24/22 90/65   Plan: Continue with medication at current dose as prescribed by PCP for now. I informed pt that if her blood pressure is regularly < 100/80 at home and/or she feels dizzy/light headed to contact us or her PCP about a dose change in her antihypertensive medication.    Continue with Prudent nutritional plan and low sodium diet, advance exercise as tolerated.  Ambulatory blood pressure monitoring encouraged.  Reminded patient that if they ever feel poorly in any way, to check their blood pressure and pulse as well.  We will continue to monitor closely alongside PCP/ specialists.  Pt reminded to also f/up with those individuals as instructed by them. We will continue to monitor symptoms as they relate to the her weight loss journey.    Vitamin D deficiency Assessment: Condition is stable. On 07/02/22, pt's Vitamin D was 73.0 and Dr.Beasly advised her to reduce OTC Vitamin D from 3,000 lU daily  to 1,000 lU daily.   Lab Results  Component Value Date   VD25OH 73.9 07/02/2022   VD25OH 33.76 02/06/2022   VD25OH 51.9 12/03/2021   Plan: Recheck labs. Continue with OTC Vitamin D at current dose.   Will continue to monitor levels regularly (every 3-4 mo on average) to keep levels within normal limits and prevent over supplementation.   TREATMENT PLAN FOR OBESITY: BMI 31.0-31.9,adult - current BMI 31.12 Obesity, Beginning BMI 38.11 Assessment: CARLEA BADOUR is here to discuss her progress with her obesity treatment plan along with follow-up of her obesity related diagnoses. See Medical Weight Management Flowsheet for complete bioelectrical impedance results.  Condition is not optimized. Biometric data collected today, was reviewed with patient.   Since last office visit on 09/24/22 patient's  Muscle mass has decreased by 2.4 lb. Fat mass has increased by 2.4 lb. Total body water has increased by 2 lb.  Counseling done on how various foods will affect these numbers and how to maximize success  Total lbs lost to date: 39 Total weight loss percentage to date: 17.26   Plan: Continue the Category 2 meal plan.   - Encouraged pt to check the nutritional menu of restaurants prior to eating out.   Behavioral Intervention Additional resources provided today: [employer], newspaper and Eating Out Guide  Evidence-based interventions for health behavior change were utilized today including the discussion of self monitoring techniques, problem-solving barriers and SMART goal setting techniques.   Regarding patient's less desirable eating habits and patterns, we employed the technique of small changes.  Pt will specifically work on: doing some form of exercise 30 minutes every day and focusing on self-care for next visit.    Recommended Physical Activity Goals  Stellar has been advised to slowly work up to 150 minutes of moderate intensity aerobic activity a week and strengthening exercises 2-3 times per week for cardiovascular health, weight loss maintenance and preservation of muscle mass.   She has agreed to Think about ways to increase daily physical activity and overcoming barriers to exercise  FOLLOW UP: Return in 4-5 wks with Dr.Beasly. She was informed of the importance of frequent follow up visits to maximize her success with intensive lifestyle modifications for her multiple health conditions.  Adrienne Mocha is aware that we  will review all of her lab results at our next visit.  She is aware that if anything is critical/ life threatening with the results, we will be contacting her via MyChart prior to the office visit to discuss management.   Subjective:   Chief complaint: Obesity Evon is here to discuss her progress with her obesity treatment plan. She is on the the Category 2 Plan and states she is following her eating plan approximately 60% of the time. She states she is walking 10-15  minutes 3-4 days per week.  Interval History:  Adrienne Mocha is here for a follow up office visit.   Since last OV, Sury has been doing well will her nutritional goals. She endorses that her hunger and cravings are controlled on the Semaglutide and when following the meal plan. She endorses occasionally having cravings for sweets.   Pharmacotherapy for weight loss: She is currently taking  Semaglutide  for medical weight loss.  Denies side effects.    Review of Systems:  Pertinent positives were addressed with patient today.   Reviewed by clinician on day of visit: allergies, medications, problem list, medical history, surgical history, family history, social  history, and previous encounter notes.  Weight Summary and Biometrics   Weight Lost Since Last Visit: 0  Weight Gained Since Last Visit: 0    Vitals Temp: 98.2 F (36.8 C) BP: 100/69 Pulse Rate: 69 SpO2: 98 %   Anthropometric Measurements Height: 5\' 5"  (1.651 m) Weight: 187 lb (84.8 kg) BMI (Calculated): 31.12 Weight at Last Visit: 189lb Weight Lost Since Last Visit: 0 Weight Gained Since Last Visit: 0 Starting Weight: 229lb Total Weight Loss (lbs): 42 lb (19.1 kg) Peak Weight: 229lb   Body Composition  Body Fat %: 41.6 % Fat Mass (lbs): 78 lbs Muscle Mass (lbs): 103.8 lbs Total Body Water (lbs): 77.2 lbs Visceral Fat Rating : 11   Other Clinical Data Fasting: yes Labs: no Today's Visit #: 31 Starting Date: 06/27/20   Objective:    PHYSICAL EXAM: Blood pressure 100/69, pulse 69, temperature 98.2 F (36.8 C), height 5\' 5"  (1.651 m), weight 187 lb (84.8 kg), last menstrual period 10/08/2018, SpO2 98 %. Body mass index is 31.12 kg/m.  General: Well Developed, well nourished, and in no acute distress.  HEENT: Normocephalic, atraumatic Skin: Warm and dry, cap RF less 2 sec, good turgor Chest:  Normal excursion, shape, no gross abn Respiratory: speaking in full sentences, no conversational dyspnea NeuroM-Sk: Ambulates w/o assistance, moves * 4 Psych: A and O *3, insight good, mood-full  DIAGNOSTIC DATA REVIEWED:  BMET    Component Value Date/Time   NA 142 07/02/2022 1058   K 4.6 07/02/2022 1058   CL 102 07/02/2022 1058   CO2 23 07/02/2022 1058   GLUCOSE 87 07/02/2022 1058   GLUCOSE 94 02/06/2022 1033   BUN 15 07/02/2022 1058   CREATININE 0.68 07/02/2022 1058   CALCIUM 9.8 07/02/2022 1058   GFRNONAA 98 06/27/2020 0840   GFRAA 113 06/27/2020 0840   Lab Results  Component Value Date   HGBA1C 5.7 (H) 07/08/2022   HGBA1C 6.5 (H) 03/02/2008   Lab Results  Component Value Date   INSULIN 10.3 07/02/2022   INSULIN 19.9 06/27/2020   Lab Results  Component Value Date   TSH 1.29 02/06/2022   CBC    Component Value Date/Time   WBC 5.8 02/06/2022 1033   RBC 4.17 02/06/2022 1033   HGB 12.4 02/06/2022 1033   HGB 13.0 06/27/2020 0840   HCT 37.0 02/06/2022 1033   HCT 39.0 06/27/2020 0840   PLT 240.0 02/06/2022 1033   PLT 268 06/27/2020 0840   MCV 88.6 02/06/2022 1033   MCV 87 06/27/2020 0840   MCH 28.9 06/27/2020 0840   MCH 28.9 09/23/2018 0404   MCHC 33.5 02/06/2022 1033   RDW 14.2 02/06/2022 1033   RDW 14.3 06/27/2020 0840   Iron Studies    Component Value Date/Time   IRON 62 11/09/2008 1037   FERRITIN 3.9 (L) 11/09/2008 1037   IRONPCTSAT 11.3 (L) 11/09/2008 1037   Lipid Panel     Component Value Date/Time   CHOL 161 07/02/2022 1058   TRIG 66 07/02/2022 1058   HDL 49 07/02/2022 1058    CHOLHDL 3 04/14/2022 0758   VLDL 15.2 04/14/2022 0758   LDLCALC 99 07/02/2022 1058   LDLDIRECT 144.2 09/21/2008 1008   Hepatic Function Panel     Component Value Date/Time   PROT 7.1 07/02/2022 1058   ALBUMIN 4.5 07/02/2022 1058   AST 21 07/02/2022 1058   ALT 24 07/02/2022 1058   ALKPHOS 85 07/02/2022 1058   BILITOT 0.4 07/02/2022 1058   BILIDIR 0.1 03/02/2008  1018      Component Value Date/Time   TSH 1.29 02/06/2022 1033   Nutritional Lab Results  Component Value Date   VD25OH 73.9 07/02/2022   VD25OH 33.76 02/06/2022   VD25OH 51.9 12/03/2021    Attestations:   I, Special Puri, acting as a Stage manager for [employer], DO., have compiled all relevant documentation for today's office visit on behalf of Thomasene Lot, DO, while in the presence of [employer], DO.  I have reviewed the above documentation for accuracy and completeness, and I agree with the above. Carlye Grippe, D.O.  The 21st Century Cures Act was signed into law in 2016 which includes the topic of electronic health records.  This provides immediate access to information in MyChart.  This includes consultation notes, operative notes, office notes, lab results and pathology reports.  If you have any questions about what you read please let us know at your next visit so we can discuss your concerns and take corrective action if need be.  We are right here with you.

## 2022-11-02 NOTE — Progress Notes (Signed)
Subjective:    Patient ID: Patty Williams, female    DOB: 02-03-1961, 62 y.o.   MRN: [id number]  HPI  Wt Readings from Last 3 Encounters:  11/02/22 192 lb 6 oz (87.3 kg)  11/02/22 187 lb (84.8 kg)  10/13/22 195 lb (88.5 kg)   32.01 kg/m  Vitals:   11/02/22 1131  BP: 122/80  Pulse: 67  Temp: 97.8 F (36.6 C)  SpO2: 97%   Pt presents for follow up of HTN and to complete a form  For hot air ballooning  For hypertension - so she is safe to continue    Has taken time off  Working one day per week  Practice is going to be bought    Taking care of herself   Exercise is challenging- some chronic pain issues  Does some upper body exercise when tolerated Is able to do activities of hot air ballooning with her team without difficulty   Going to PT  Also to the gym  HTN-no problems at home / has improved with better lifestyle habits  bp is stable today  No cp or palpitations or headaches or edema  No side effects to medicines  BP Readings from Last 3 Encounters:  11/02/22 122/80  11/02/22 100/69  10/13/22 118/76    Benicar 40 mg daily  Tolerates well/no problems   Mother had diabetes      Patient Active Problem List   Diagnosis Date Noted   Notalgia 08/11/2022   Hyperlipidemia associated with type 2 diabetes mellitus (HCC) 07/02/2022   Elevated ALT measurement 07/02/2022   Stress 06/16/2022   BMI 31.0-31.9,adult 06/16/2022   Obesity, Beginning BMI 38.11 06/16/2022   Class 2 severe obesity with serious comorbidity and body mass index (BMI) of 38.0 to 38.9 in adult (HCC) 05/25/2022   Type 2 diabetes mellitus with obesity (HCC) 05/25/2022   Other hyperlipidemia 01/05/2022   Heat intolerance 12/15/2021   Type 2 diabetes mellitus with hyperglycemia, without long-term current use of insulin (HCC) 12/15/2021   Type 2 diabetes mellitus with other specified complication (HCC) 09/30/2021   Lumbar disc disease 02/11/2021   Other fatigue 06/27/2020   Vitamin D  deficiency 06/27/2020   At risk for heart disease 06/27/2020   Intermittent diarrhea 03/15/2020   Hx of adenomatous polyp of colon 03/24/2019   Family history of pulmonary fibrosis 12/23/2018   Family history of kidney disease 12/23/2018   Colon cancer screening 12/23/2018   Vaginal bleeding 09/22/2018   Current use of proton pump inhibitor 10/18/2015   History of fracture 10/18/2015   Left knee pain 04/19/2015   Frequent UTI 10/06/2013   Seasonal and perennial allergic rhinitis 01/01/2013   Routine general medical examination at a health care facility 04/30/2011   Controlled type 2 diabetes mellitus without complication, without long-term current use of insulin (HCC)    Plantar fasciitis    METATARSALGIA 04/11/2010   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   Obstructive sleep apnea 06/10/2009   GERD 11/09/2008   Essential hypertension 03/02/2008   Class 1 obesity due to excess calories with serious comorbidity and body mass index (BMI) of 33.0 to 33.9 in adult 11/18/2007   Allergic asthma, mild intermittent, uncomplicated 03/29/2007   Endometriosis 03/29/2007   Past Medical History:  Diagnosis Date   Anxiety    Asthma    Back pain    Bilateral ovarian cysts    Bruxism    Constipation    Diabetes mellitus type II    mild  Diverticulosis 2014   Mild   Dry mouth    Elevated blood pressure reading without diagnosis of hypertension    Endometriosis    Severe   Fibroids    GERD (gastroesophageal reflux disease)    History of ovarian cyst    HLD (hyperlipidemia)    Hx of adenomatous polyp of colon 03/24/2019   Hyperglycemia    Hyperlipidemia    Hypertension    Iron deficiency anemia    Joint pain    Menorrhagia    Obesity    Obesity    OSA on CPAP    Plantar fasciitis    with orthotics-much difficulty adjusting these   PMB (postmenopausal bleeding)    Pneumonia    history of   PONV (postoperative nausea and vomiting)    Pre-diabetes    Rectovaginal fistula     Sleep apnea    Uterine adhesion    Uterine fibroid    Wears glasses    Past Surgical History:  Procedure Laterality Date   COLONOSCOPY  01/2009   DILATATION & CURETTAGE/HYSTEROSCOPY WITH MYOSURE N/A 09/22/2018   Procedure: DILATATION & CURETTAGE/HYSTEROSCOPY;  Surgeon: Noland Fordyce, MD;  Location: [facility];  Service: Gynecology;  Laterality: N/A;   HEMORRHOID SURGERY     LAPAROSCOPY     endometriosis   NASAL SEPTOPLASTY W/ TURBINOPLASTY Bilateral 09/02/2021   Procedure: NASAL SEPTOPLASTY WITH SUBMUCOSAL RESECTION OF TURBINATES;  Surgeon: Linus Salmons, MD;  Location: [facility];  Service: ENT;  Laterality: Bilateral;   SEPTOPLASTY     april 2023   TONSILLECTOMY AND ADENOIDECTOMY     TRIGGER FINGER RELEASE     UPPER GI ENDOSCOPY  01/2009   uterine biopsy     uterine cyst exploration     UTERINE SUSPENSION     WISDOM TOOTH EXTRACTION     Social History   Tobacco Use   Smoking status: Never   Smokeless tobacco: Never  Vaping Use   Vaping Use: Never used  Substance Use Topics   Alcohol use: Yes    Comment: occasionally   Drug use: No   Family History  Problem Relation Age of Onset   Thyroid disease Mother        ?   Diabetes Mother    Hypertension Mother    Hyperlipidemia Mother    Anxiety disorder Mother    Sleep apnea Mother    Obesity Mother    Cancer Father        prostate   Hypertension Father    Obesity Father    Prostate cancer Father    Heart attack Other        grandfather (in his 24's)   Parkinsonism Other        grandfater   Stomach cancer Neg Hx    Colon cancer Neg Hx    Esophageal cancer Neg Hx    No Known Allergies Current Outpatient Medications on File Prior to Visit  Medication Sig Dispense Refill   albuterol (VENTOLIN HFA) 108 (90 Base) MCG/ACT inhaler Inhale 2 puffs into the lungs every 4 (four) hours as needed. 8.5 g 12   AMBULATORY NON FORMULARY MEDICATION Single glucometer with lancets, test strips-Check FBS and 2  hour PP(BID) 1 each 0   budesonide-formoterol (SYMBICORT) 80-4.5 MCG/ACT inhaler USE 2 INHALATIONS TWICE A DAY 30.6 g 4   Coenzyme Q10 (COQ10) 200 MG CAPS Take 200 mg by mouth daily.     docusate sodium (COLACE) 100 MG capsule Take 100 mg by  mouth daily as needed for mild constipation.     magnesium oxide (MAG-OX) 400 MG tablet Take by mouth.     Multiple Vitamin (MULTIVITAMIN) tablet Take 1 tablet by mouth daily.     naproxen (NAPROSYN) 500 MG tablet Take 1 tablet (500 mg total) by mouth 2 (two) times daily as needed for moderate pain. With a meal 60 tablet 3   olmesartan (BENICAR) 40 MG tablet Take 1 tablet (40 mg total) by mouth daily. 90 tablet 1   OVER THE COUNTER MEDICATION Pepcid, Take one at bedtime daily     pantoprazole (PROTONIX) 40 MG tablet Take 1 tablet (40 mg total) by mouth 2 (two) times daily before a meal. Breakfast and supper (Patient taking differently: Take 40 mg by mouth 2 (two) times daily before a meal. Breakfast and supper  Does BID dosing when she takes her Naprosyn) 60 tablet 11   rosuvastatin (CRESTOR) 5 MG tablet Take 1 tablet (5 mg total) by mouth daily. 90 tablet 3   Semaglutide, 2 MG/DOSE, 8 MG/3ML SOPN Inject 2 mg as directed once a week for 28 days. 3 mL 2   TURMERIC PO Take by mouth.     No current facility-administered medications on file prior to visit.    Review of Systems  Constitutional:  Negative for activity change, appetite change, fatigue, fever and unexpected weight change.  HENT:  Negative for congestion, ear pain, rhinorrhea, sinus pressure and sore throat.   Eyes:  Negative for pain, redness and visual disturbance.  Respiratory:  Negative for cough, shortness of breath and wheezing.   Cardiovascular:  Negative for chest pain and palpitations.  Gastrointestinal:  Negative for abdominal pain, blood in stool, constipation and diarrhea.  Endocrine: Negative for polydipsia and polyuria.  Genitourinary:  Negative for dysuria, frequency and urgency.   Musculoskeletal:  Positive for back pain. Negative for arthralgias and myalgias.       Dealing with ongoing back and foot issues   Skin:  Negative for pallor and rash.  Allergic/Immunologic: Negative for environmental allergies.  Neurological:  Negative for dizziness, syncope and headaches.  Hematological:  Negative for adenopathy. Does not bruise/bleed easily.  Psychiatric/Behavioral:  Negative for decreased concentration and dysphoric mood. The patient is not nervous/anxious.        Objective:   Physical Exam Constitutional:      General: She is not in acute distress.    Appearance: Normal appearance. She is well-developed. She is obese. She is not ill-appearing or diaphoretic.  HENT:     Head: Normocephalic and atraumatic.  Eyes:     Conjunctiva/sclera: Conjunctivae normal.     Pupils: Pupils are equal, round, and reactive to light.  Neck:     Thyroid: No thyromegaly.     Vascular: No carotid bruit or JVD.  Cardiovascular:     Rate and Rhythm: Normal rate and regular rhythm.     Heart sounds: Normal heart sounds.     No gallop.  Pulmonary:     Effort: Pulmonary effort is normal. No respiratory distress.     Breath sounds: Normal breath sounds. No wheezing or rales.  Abdominal:     General: There is no distension or abdominal bruit.     Palpations: Abdomen is soft.  Musculoskeletal:     Cervical back: Normal range of motion and neck supple.     Right lower leg: No edema.     Left lower leg: No edema.  Lymphadenopathy:     Cervical: No cervical  adenopathy.  Skin:    General: Skin is warm and dry.     Coloration: Skin is not pale.     Findings: No rash.  Neurological:     Mental Status: She is alert.     Coordination: Coordination normal.     Deep Tendon Reflexes: Reflexes are normal and symmetric. Reflexes normal.  Psychiatric:        Mood and Affect: Mood normal.           Assessment & Plan:   Problem List Items Addressed This Visit        Cardiovascular and Mediastinum   Essential hypertension - Primary    bp in fair control at this time  BP Readings from Last 1 Encounters:  11/02/22 122/80  No changes needed Plan to continue generic benicar 40 mg daily  Most recent labs reviewed  Disc lifstyle change with low sodium diet and exercise  Plans to keep working on wt loss and fitness  No restrictions for hot air balloon activities         Other   Class 1 obesity due to excess calories with serious comorbidity and body mass index (BMI) of 33.0 to 33.9 in adult    [facility] Using semaglutide Doing better with more time for self care

## 2022-11-02 NOTE — Patient Instructions (Addendum)
Add some strength training to your routine, this is important for bone and brain health and can reduce your risk of falls and help your body use insulin properly and regulate weight  Light weights, exercise bands , and internet videos are a good way to start  Yoga (chair or regular), machines , floor exercises or a gym with machines are also good options   Keep up the good work No medicine changes

## 2022-11-02 NOTE — Assessment & Plan Note (Signed)
bp in fair control at this time  BP Readings from Last 1 Encounters:  11/02/22 122/80   No changes needed Plan to continue generic benicar 40 mg daily  Most recent labs reviewed  Disc lifstyle change with low sodium diet and exercise  Plans to keep working on wt loss and fitness  No restrictions for hot air balloon activities

## 2022-11-03 LAB — HEMOGLOBIN A1C
Est. average glucose Bld gHb Est-mCnc: 117 mg/dL
Hgb A1c MFr Bld: 5.7 % — ABNORMAL HIGH (ref 4.8–5.6)

## 2022-11-03 LAB — VITAMIN D 25 HYDROXY (VIT D DEFICIENCY, FRACTURES): Vit D, 25-Hydroxy: 63.3 ng/mL (ref 30.0–100.0)

## 2022-11-09 ENCOUNTER — Ambulatory Visit (INDEPENDENT_AMBULATORY_CARE_PROVIDER_SITE_OTHER): Payer: BC Managed Care – PPO | Admitting: Family Medicine

## 2022-11-09 ENCOUNTER — Encounter: Payer: Self-pay | Admitting: Family Medicine

## 2022-11-09 VITALS — BP 118/76 | HR 74 | Temp 97.6°F | Ht 65.0 in | Wt 192.4 lb

## 2022-11-09 DIAGNOSIS — Z6833 Body mass index (BMI) 33.0-33.9, adult: Secondary | ICD-10-CM

## 2022-11-09 DIAGNOSIS — M519 Unspecified thoracic, thoracolumbar and lumbosacral intervertebral disc disorder: Secondary | ICD-10-CM

## 2022-11-09 DIAGNOSIS — E6609 Other obesity due to excess calories: Secondary | ICD-10-CM | POA: Diagnosis not present

## 2022-11-09 DIAGNOSIS — F4323 Adjustment disorder with mixed anxiety and depressed mood: Secondary | ICD-10-CM | POA: Diagnosis not present

## 2022-11-09 NOTE — Progress Notes (Unsigned)
Subjective:    Patient ID: Patty Williams, female    DOB: 1961/02/18, 62 y.o.   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 Severe L4-5 facet arthrosis with mild facet edema and trace anterolisthesis. Mild-to-moderate lateral recess and right neural foraminal stenosis. 2. Mild-to-moderate lateral recess stenosis and mild right neural foraminal stenosis at L3-4. 3. Left foraminal and extraforaminal disc protrusion at L2-3 contacting the extraforaminal left L2 nerve.  Had another MRI of pelvis with [facility]     has seen Dr Channing Mutters in [place] for lumbar stenosis with neurogenic claudication   Per last note: Plan  DrStormy Fabian will continue with conservative management. Her symptoms currently are tolerable. She will continue to exercise on a regular basis which will include walking. She should refrain from activities that may put axial load onto her spine. She may perform stretching exercises which I believe will be helpful.  I reviewed the MRI images with her. She verbalized understanding. She will continue to tolerate symptoms and as long as it is tolerable there is no need for surgical intervention. If she becomes refractory to  conservative treatment, she may be a candidate for bilateral L4-5 decompression followed by posterolateral bone fusion and pedicle screws fixation at L4-5.  Patient will return to see [place] as needed. 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    Tolerates work 1 d per week face to face Needs several hours to work on stretching and rom in the am   Patient Active Problem List   Diagnosis Date Noted   Notalgia 08/11/2022   Hyperlipidemia associated with type 2 diabetes mellitus (HCC) 07/02/2022   Elevated ALT measurement 07/02/2022   Stress 06/16/2022   BMI 31.0-31.9,adult 06/16/2022   Obesity, Beginning BMI 38.11 06/16/2022   Class 2 severe obesity with serious comorbidity and body mass index (BMI) of 38.0 to 38.9 in adult (HCC) 05/25/2022   Type 2 diabetes mellitus with obesity (HCC) 05/25/2022   Other hyperlipidemia 01/05/2022   Heat intolerance 12/15/2021  Type 2 diabetes mellitus with hyperglycemia, without long-term current use of insulin (HCC) 12/15/2021   Type 2 diabetes mellitus with other specified complication (HCC) 09/30/2021   Lumbar disc disease 02/11/2021   Other fatigue 06/27/2020   Vitamin D deficiency 06/27/2020   At risk for heart disease 06/27/2020   Intermittent diarrhea 03/15/2020   Hx of adenomatous polyp of colon 03/24/2019   Family history of pulmonary fibrosis 12/23/2018   Family history of kidney disease 12/23/2018   Colon cancer screening 12/23/2018   Vaginal bleeding 09/22/2018   Current use of proton pump inhibitor  10/18/2015   History of fracture 10/18/2015   Left knee pain 04/19/2015   Frequent UTI 10/06/2013   Seasonal and perennial allergic rhinitis 01/01/2013   Routine general medical examination at a health care facility 04/30/2011   Controlled type 2 diabetes mellitus without complication, without long-term current use of insulin (HCC)    Plantar fasciitis    METATARSALGIA 04/11/2010   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   Obstructive sleep apnea 06/10/2009   GERD 11/09/2008   Essential hypertension 03/02/2008   Class 1 obesity due to excess calories with serious comorbidity and body mass index (BMI) of 33.0 to 33.9 in adult 11/18/2007   Allergic asthma, mild intermittent, uncomplicated 03/29/2007   Endometriosis 03/29/2007   Past Medical History:  Diagnosis Date   Anxiety    Asthma    Back pain    Bilateral ovarian cysts    Bruxism    Constipation    Diabetes mellitus type II    mild   Diverticulosis 2014   Mild   Dry mouth    Elevated blood pressure reading without diagnosis of hypertension    Endometriosis    Severe   Fibroids    GERD (gastroesophageal reflux disease)    History of ovarian cyst    HLD (hyperlipidemia)    Hx of adenomatous polyp of colon 03/24/2019   Hyperglycemia    Hyperlipidemia    Hypertension    Iron deficiency anemia    Joint pain    Menorrhagia    Obesity    Obesity    OSA on CPAP    Plantar fasciitis    with orthotics-much difficulty adjusting these   PMB (postmenopausal bleeding)    Pneumonia    history of   PONV (postoperative nausea and vomiting)    Pre-diabetes    Rectovaginal fistula    Sleep apnea    Uterine adhesion    Uterine fibroid    Wears glasses    Past Surgical History:  Procedure Laterality Date   COLONOSCOPY  01/2009   DILATATION & CURETTAGE/HYSTEROSCOPY WITH MYOSURE N/A 09/22/2018   Procedure: DILATATION & CURETTAGE/HYSTEROSCOPY;  Surgeon: Noland Fordyce, MD;  Location: [facility];  Service:  Gynecology;  Laterality: N/A;   HEMORRHOID SURGERY     LAPAROSCOPY     endometriosis   NASAL SEPTOPLASTY W/ TURBINOPLASTY Bilateral 09/02/2021   Procedure: NASAL SEPTOPLASTY WITH SUBMUCOSAL RESECTION OF TURBINATES;  Surgeon: Linus Salmons, MD;  Location: [facility];  Service: ENT;  Laterality: Bilateral;   SEPTOPLASTY     april 2023   TONSILLECTOMY AND ADENOIDECTOMY     TRIGGER FINGER RELEASE     UPPER GI ENDOSCOPY  01/2009   uterine biopsy     uterine cyst exploration     UTERINE SUSPENSION     WISDOM TOOTH EXTRACTION     Social History   Tobacco Use   Smoking status: Never   Smokeless tobacco:  Never  Vaping Use   Vaping Use: Never used  Substance Use Topics   Alcohol use: Yes    Comment: occasionally   Drug use: No   Family History  Problem Relation Age of Onset   Thyroid disease Mother        ?   Diabetes Mother    Hypertension Mother    Hyperlipidemia Mother    Anxiety disorder Mother    Sleep apnea Mother    Obesity Mother    Cancer Father        prostate   Hypertension Father    Obesity Father    Prostate cancer Father    Heart attack Other        grandfather (in his 86's)   Parkinsonism Other        grandfater   Stomach cancer Neg Hx    Colon cancer Neg Hx    Esophageal cancer Neg Hx    No Known Allergies Current Outpatient Medications on File Prior to Visit  Medication Sig Dispense Refill   albuterol (VENTOLIN HFA) 108 (90 Base) MCG/ACT inhaler Inhale 2 puffs into the lungs every 4 (four) hours as needed. 8.5 g 12   AMBULATORY NON FORMULARY MEDICATION Single glucometer with lancets, test strips-Check FBS and 2 hour PP(BID) 1 each 0   budesonide-formoterol (SYMBICORT) 80-4.5 MCG/ACT inhaler USE 2 INHALATIONS TWICE A DAY 30.6 g 4   Coenzyme Q10 (COQ10) 200 MG CAPS Take 200 mg by mouth daily.     docusate sodium (COLACE) 100 MG capsule Take 100 mg by mouth daily as needed for mild constipation.     magnesium oxide (MAG-OX) 400 MG tablet Take by mouth.      Multiple Vitamin (MULTIVITAMIN) tablet Take 1 tablet by mouth daily.     naproxen (NAPROSYN) 500 MG tablet Take 1 tablet (500 mg total) by mouth 2 (two) times daily as needed for moderate pain. With a meal 60 tablet 3   olmesartan (BENICAR) 40 MG tablet Take 1 tablet (40 mg total) by mouth daily. 90 tablet 1   OVER THE COUNTER MEDICATION Pepcid, Take one at bedtime daily     pantoprazole (PROTONIX) 40 MG tablet Take 1 tablet (40 mg total) by mouth 2 (two) times daily before a meal. Breakfast and supper (Patient taking differently: Take 40 mg by mouth 2 (two) times daily before a meal. Breakfast and supper  Does BID dosing when she takes her Naprosyn) 60 tablet 11   rosuvastatin (CRESTOR) 5 MG tablet Take 1 tablet (5 mg total) by mouth daily. 90 tablet 3   Semaglutide, 2 MG/DOSE, 8 MG/3ML SOPN Inject 2 mg as directed once a week for 28 days. 3 mL 2   TURMERIC PO Take by mouth.     No current facility-administered medications on file prior to visit.    Review of Systems  Constitutional:  Negative for activity change, appetite change, fatigue, fever and unexpected weight change.  HENT:  Negative for congestion, ear pain, rhinorrhea, sinus pressure and sore throat.   Eyes:  Negative for pain, redness and visual disturbance.  Respiratory:  Negative for cough, shortness of breath and wheezing.   Cardiovascular:  Negative for chest pain and palpitations.  Gastrointestinal:  Negative for abdominal pain, blood in stool, constipation and diarrhea.  Endocrine: Negative for polydipsia and polyuria.  Genitourinary:  Negative for dysuria, frequency and urgency.  Musculoskeletal:  Positive for neck pain. Negative for arthralgias, back pain, joint swelling and myalgias.  Hip, lumbar pain  Some neck pain   Also intermittent numbness in mid back  Skin:  Negative for pallor and rash.  Allergic/Immunologic: Negative for environmental allergies.  Neurological:  Positive for numbness. Negative for  dizziness, syncope and headaches.       Some numbness in mid TS with prolonged bending forward /work   Hematological:  Negative for adenopathy. Does not bruise/bleed easily.  Psychiatric/Behavioral:  Negative for decreased concentration and dysphoric mood. The patient is not nervous/anxious.        Objective:   Physical Exam Constitutional:      General: She is not in acute distress.    Appearance: Normal appearance. She is obese. She is not ill-appearing.  Eyes:     Conjunctiva/sclera: Conjunctivae normal.     Pupils: Pupils are equal, round, and reactive to light.  Cardiovascular:     Rate and Rhythm: Normal rate and regular rhythm.  Musculoskeletal:     Cervical back: Neck supple.     Comments: Limited rom of LS  No tenderness in TS   Normal gait   Pain is worst with bending forward   Lymphadenopathy:     Cervical: No cervical adenopathy.  Skin:    Findings: No bruising.  Neurological:     Mental Status: She is alert.     Cranial Nerves: No cranial nerve deficit.     Coordination: Coordination normal.  Psychiatric:        Mood and Affect: Mood normal.           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

## 2022-11-09 NOTE — Assessment & Plan Note (Addendum)
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

## 2022-11-09 NOTE — Patient Instructions (Addendum)
Continue the PT I will do your paperwork   Ask PT for a recommendation for a trainer   Chair yoga is a great way to start  Water exercise   Gradual start of strength training

## 2022-11-12 ENCOUNTER — Encounter: Payer: Self-pay | Admitting: Family Medicine

## 2022-11-12 NOTE — Assessment & Plan Note (Signed)
Continues to work on this  Down to 192 lb and bmi of 32.0   Commended  Encouraged to continue healthy habits

## 2022-11-18 ENCOUNTER — Encounter: Payer: Self-pay | Admitting: Internal Medicine

## 2022-11-19 ENCOUNTER — Ambulatory Visit: Payer: BC Managed Care – PPO | Admitting: Internal Medicine

## 2022-11-30 ENCOUNTER — Other Ambulatory Visit: Payer: Self-pay | Admitting: Internal Medicine

## 2022-12-01 DIAGNOSIS — H40033 Anatomical narrow angle, bilateral: Secondary | ICD-10-CM | POA: Diagnosis not present

## 2022-12-03 ENCOUNTER — Ambulatory Visit (INDEPENDENT_AMBULATORY_CARE_PROVIDER_SITE_OTHER): Payer: BC Managed Care – PPO | Admitting: Internal Medicine

## 2022-12-03 ENCOUNTER — Encounter: Payer: Self-pay | Admitting: Internal Medicine

## 2022-12-03 VITALS — BP 124/80 | HR 64 | Ht 65.0 in | Wt 191.0 lb

## 2022-12-03 DIAGNOSIS — N823 Fistula of vagina to large intestine: Secondary | ICD-10-CM | POA: Diagnosis not present

## 2022-12-03 DIAGNOSIS — K219 Gastro-esophageal reflux disease without esophagitis: Secondary | ICD-10-CM

## 2022-12-03 DIAGNOSIS — Z01419 Encounter for gynecological examination (general) (routine) without abnormal findings: Secondary | ICD-10-CM | POA: Diagnosis not present

## 2022-12-03 DIAGNOSIS — K641 Second degree hemorrhoids: Secondary | ICD-10-CM

## 2022-12-03 NOTE — Progress Notes (Signed)
Patty Williams 62 y.o. 02/25/1961 [id number]  Assessment & Plan:   Encounter Diagnoses  Name Primary?   Prolapsed internal hemorrhoids, grade 2 Yes   Rectovaginal fistula    Gastroesophageal reflux disease, unspecified whether esophagitis present    She has responded well to hemorrhoidal banding.  She has decided to not pursue further evaluation of rectovaginal fistula.   Okay to try to reduce PPI dosage.  She has required twice daily over the years to control symptoms.  She has not had erosive esophagitis.  Goal is to adjust dose for symptom control.  F/u prn  Routine colonoscopy 2027 is anticipated.    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  She has had a nice response and is not having symptoms anymore.  She is happy with that.  She has added soluble fiber to her diet and continued on Citrucel as well.  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  She saw gynecology today.  I had looked at and reviewed MRI of the lumbosacral spine but it was not helpful with respect to the fistula.  I spoke to radiology and they thought it could be difficult to find a fistula.  She has a history of significant endometriosis and has had surgeries and her gynecologist thought a surgery would be difficult given prior history as mentioned above.  The patient herself is comfortable living with this problem which she has for many years she was just wondering if there was a need to try to take care of it to prevent future problems.  She has GERD symptoms and has been on twice daily PPI with success.  She has been experimenting taking pantoprazole in the middle of the day  and to try to take 1 pill a day because she is somewhat concerned about the potential issues of osteoporosis and bone fractures.  I did explain that those are weak associations and not proven cause-and-effect. No Known Allergies Current Meds  Medication Sig   albuterol (VENTOLIN HFA) 108 (90 Base) MCG/ACT inhaler Inhale 2 puffs into the lungs every 4 (four) hours as needed.   AMBULATORY NON FORMULARY MEDICATION Single glucometer with lancets, test strips-Check FBS and 2 hour PP(BID)   budesonide-formoterol (SYMBICORT) 80-4.5 MCG/ACT inhaler USE 2 INHALATIONS TWICE A DAY   Coenzyme Q10 (COQ10) 200 MG CAPS Take 200 mg by mouth daily.   docusate sodium (COLACE) 100 MG capsule Take 100 mg by mouth daily as needed for mild constipation.   magnesium oxide (MAG-OX) 400 MG tablet Take by mouth.   Multiple Vitamin (MULTIVITAMIN) tablet Take 1 tablet by mouth daily.   naproxen (NAPROSYN) 500 MG tablet Take 1 tablet (500 mg total) by mouth 2 (two) times daily as needed for moderate pain. With a meal   olmesartan (BENICAR) 40 MG tablet Take 1 tablet (40 mg total) by mouth daily.   OVER THE COUNTER MEDICATION Pepcid, Take one at bedtime daily   pantoprazole (PROTONIX) 40 MG tablet Take 1 tablet (40 mg total) by mouth 2 (two) times daily before a meal. Breakfast and supper   rosuvastatin (CRESTOR) 5 MG tablet Take 1 tablet (5 mg total) by mouth daily.   Semaglutide (OZEMPIC, 1  MG/DOSE, [place]) Inject into the skin.   TURMERIC PO Take by mouth.   Past Medical History:  Diagnosis Date   Anxiety    Asthma    Back pain    Bilateral ovarian cysts    Bruxism    Constipation    Diabetes mellitus type II    mild   Diverticulosis 2014   Mild   Dry mouth    Elevated blood pressure reading without diagnosis of hypertension    Endometriosis    Severe   Fibroids    GERD (gastroesophageal reflux disease)    History of ovarian cyst    HLD (hyperlipidemia)    Hx of adenomatous polyp of colon 03/24/2019    Hyperglycemia    Hyperlipidemia    Hypertension    Iron deficiency anemia    Joint pain    Menorrhagia    Obesity    Obesity    OSA on CPAP    Plantar fasciitis    with orthotics-much difficulty adjusting these   PMB (postmenopausal bleeding)    Pneumonia    history of   PONV (postoperative nausea and vomiting)    Pre-diabetes    Rectovaginal fistula    Sleep apnea    Uterine adhesion    Uterine fibroid    Wears glasses    Past Surgical History:  Procedure Laterality Date   COLONOSCOPY  01/2009   DILATATION & CURETTAGE/HYSTEROSCOPY WITH MYOSURE N/A 09/22/2018   Procedure: DILATATION & CURETTAGE/HYSTEROSCOPY;  Surgeon: Noland Fordyce, MD;  Location: [facility];  Service: Gynecology;  Laterality: N/A;   HEMORRHOID BANDING     HEMORRHOID SURGERY     LAPAROSCOPY     endometriosis   NASAL SEPTOPLASTY W/ TURBINOPLASTY Bilateral 09/02/2021   Procedure: NASAL SEPTOPLASTY WITH SUBMUCOSAL RESECTION OF TURBINATES;  Surgeon: Linus Salmons, MD;  Location: [facility];  Service: ENT;  Laterality: Bilateral;   SEPTOPLASTY     april 2023   TONSILLECTOMY AND ADENOIDECTOMY     TRIGGER FINGER RELEASE     UPPER GI ENDOSCOPY  01/2009   uterine biopsy     uterine cyst exploration     UTERINE SUSPENSION     WISDOM TOOTH EXTRACTION     Social History   Social History Narrative   Permanent life partner/wife-female   She is a Education officer, community   Occasional alcohol no tobacco or drug use   family history includes Anxiety disorder in her mother; Cancer in her father; Diabetes in her mother; Heart attack in an other family member; Hyperlipidemia in her mother; Hypertension in her father and mother; Obesity in her father and mother; Parkinsonism in an other family member; Prostate cancer in her father; Sleep apnea in her mother; Thyroid disease in her mother.   Review of Systems As per HPI  Objective:   Physical Exam BP 124/80   Pulse 64   Ht 5\' 5"  (1.651 m)   Wt 191 lb (86.6  kg)   LMP 10/08/2018 (Exact Date)   BMI 31.78 kg/m

## 2022-12-03 NOTE — Patient Instructions (Signed)
Ok to cut back on the pantoprazole dosage as you are doing.  _______________________________________________________  If your blood pressure at your visit was 140/90 or greater, please contact your primary care physician to follow up on this.  _______________________________________________________  If you are age 63 or older, your body mass index should be between 23-30. Your Body mass index is 31.78 kg/m. If this is out of the aforementioned range listed, please consider follow up with your Primary Care Provider.  If you are age 74 or younger, your body mass index should be between 19-25. Your Body mass index is 31.78 kg/m. If this is out of the aformentioned range listed, please consider follow up with your Primary Care Provider.   ________________________________________________________  The [place] [facility] providers would like to encourage you to use [facility] to communicate with providers for non-urgent requests or questions.  Due to long hold times on the telephone, sending your provider a message by [facility] may be a faster and more efficient way to get a response.  Please allow 48 business hours for a response.  Please remember that this is for non-urgent requests.  _______________________________________________________  I appreciate the opportunity to care for you. Stan Head, MD, [facility]

## 2022-12-07 ENCOUNTER — Ambulatory Visit: Payer: BC Managed Care – PPO | Admitting: Internal Medicine

## 2022-12-08 ENCOUNTER — Encounter (INDEPENDENT_AMBULATORY_CARE_PROVIDER_SITE_OTHER): Payer: Self-pay | Admitting: Family Medicine

## 2022-12-08 ENCOUNTER — Ambulatory Visit (INDEPENDENT_AMBULATORY_CARE_PROVIDER_SITE_OTHER): Payer: BC Managed Care – PPO | Admitting: Family Medicine

## 2022-12-08 VITALS — BP 109/70 | HR 60 | Temp 98.0°F | Ht 65.0 in | Wt 185.0 lb

## 2022-12-08 DIAGNOSIS — Z6830 Body mass index (BMI) 30.0-30.9, adult: Secondary | ICD-10-CM | POA: Diagnosis not present

## 2022-12-08 DIAGNOSIS — E7849 Other hyperlipidemia: Secondary | ICD-10-CM | POA: Diagnosis not present

## 2022-12-08 DIAGNOSIS — E1169 Type 2 diabetes mellitus with other specified complication: Secondary | ICD-10-CM | POA: Diagnosis not present

## 2022-12-08 DIAGNOSIS — Z7985 Long-term (current) use of injectable non-insulin antidiabetic drugs: Secondary | ICD-10-CM

## 2022-12-08 DIAGNOSIS — E669 Obesity, unspecified: Secondary | ICD-10-CM | POA: Diagnosis not present

## 2022-12-08 MED ORDER — OZEMPIC (2 MG/DOSE) 8 MG/3ML ~~LOC~~ SOPN
2.0000 mg | PEN_INJECTOR | SUBCUTANEOUS | 0 refills | Status: DC
Start: 2022-12-08 — End: 2023-01-07

## 2022-12-08 NOTE — Progress Notes (Unsigned)
Chief Complaint:   OBESITY Patty Williams is here to discuss her progress with her obesity treatment plan along with follow-up of her obesity related diagnoses. Patty Williams is on the Category 2 Plan and states she is following her eating plan approximately 65% of the time. Patty Williams states she is walking and doing physical therapy for 15 minutes 4 times per week.  Today's visit was #: 32 Starting weight: 229 lbs Starting date: 06/27/2020 Today's weight: 185 lbs Today's date: 12/08/2022 Total lbs lost to date: 44 Total lbs lost since last in-office visit: 2  Interim History: Patient continues to do well with her weight loss. She is working on meal planning, but she struggles at times.   Subjective:   1. Type 2 diabetes mellitus with other specified complication, unspecified whether long term insulin use (HCC) Patient is on Ozempic, and she is working on her diet and exercise. She denies nausea, vomiting, or hypoglycemia.   2. Other hyperlipidemia Patient is on Crestor, and she is working on decreasing cholesterol in her diet. No side effects were noted.   Assessment/Plan:   1. Type 2 diabetes mellitus with other specified complication, unspecified whether long term insulin use (HCC) We will refill Ozempic 2 mg once weekly for 1 month (Patient is to take 1 mg).   - Semaglutide, 2 MG/DOSE, (OZEMPIC, 2 MG/DOSE,) 8 MG/3ML SOPN; Inject 2 mg into the skin once a week.  Dispense: 3 mL; Refill: 0  2. Other hyperlipidemia Patient will continue with increasing vegetables and lean protein, and we will recheck labs later.   3. BMI 30.0-30.9,adult  4. Obesity, Beginning BMI 38.11 Patty Williams is currently in the action stage of change. As such, her goal is to continue with weight loss efforts. She has agreed to the Category 2 Plan.   Exercise goals: As is.   Behavioral modification strategies: increasing lean protein intake and meal planning and cooking strategies.  Patty Williams has agreed to follow-up with our clinic  in 4 weeks. She was informed of the importance of frequent follow-up visits to maximize her success with intensive lifestyle modifications for her multiple health conditions.   Objective:   Blood pressure 109/70, pulse 60, temperature 98 F (36.7 C), height 5\' 5"  (1.651 m), weight 185 lb (83.9 kg), last menstrual period 10/08/2018, SpO2 96%. Body mass index is 30.79 kg/m.  Lab Results  Component Value Date   CREATININE 0.68 07/02/2022   BUN 15 07/02/2022   NA 142 07/02/2022   K 4.6 07/02/2022   CL 102 07/02/2022   CO2 23 07/02/2022   Lab Results  Component Value Date   ALT 24 07/02/2022   AST 21 07/02/2022   ALKPHOS 85 07/02/2022   BILITOT 0.4 07/02/2022   Lab Results  Component Value Date   HGBA1C 5.7 (H) 11/02/2022   HGBA1C 5.7 (H) 07/08/2022   HGBA1C 5.9 (H) 04/06/2022   HGBA1C 5.7 (H) 12/03/2021   HGBA1C 5.9 (H) 07/08/2021   Lab Results  Component Value Date   INSULIN 10.3 07/02/2022   INSULIN 19.5 12/03/2021   INSULIN 13.2 07/08/2021   INSULIN 22.6 11/06/2020   INSULIN 19.9 06/27/2020   Lab Results  Component Value Date   TSH 1.29 02/06/2022   Lab Results  Component Value Date   CHOL 161 07/02/2022   HDL 49 07/02/2022   LDLCALC 99 07/02/2022   LDLDIRECT 144.2 09/21/2008   TRIG 66 07/02/2022   CHOLHDL 3 04/14/2022   Lab Results  Component Value Date   VD25OH  63.3 11/02/2022   VD25OH 73.9 07/02/2022   VD25OH 33.76 02/06/2022   Lab Results  Component Value Date   WBC 5.8 02/06/2022   HGB 12.4 02/06/2022   HCT 37.0 02/06/2022   MCV 88.6 02/06/2022   PLT 240.0 02/06/2022   Lab Results  Component Value Date   IRON 62 11/09/2008   FERRITIN 3.9 (L) 11/09/2008   Attestation Statements:   Reviewed by clinician on day of visit: allergies, medications, problem list, medical history, surgical history, family history, social history, and previous encounter notes.   I, Burt Knack, am acting as transcriptionist for Quillian Quince, MD.  I have  reviewed the above documentation for accuracy and completeness, and I agree with the above. -  ***

## 2022-12-10 ENCOUNTER — Telehealth: Payer: Self-pay | Admitting: Internal Medicine

## 2022-12-10 NOTE — Telephone Encounter (Signed)
Patient states FAA is requiring her to complete PFT. States they now require this yearly. LOV 10/13/22. Patient states she will be paying out of pocket, so she would prefer to complete in office. Once order is placed, route back to front desk so that we can schedule.

## 2022-12-11 NOTE — Telephone Encounter (Signed)
If FAA will accept simple spirometry, this will be cheaper than full PFT. Order- simple spirometry- dx asthma mild intermittent uncomplicated

## 2022-12-11 NOTE — Telephone Encounter (Signed)
Recent PFT 03/27/22  Please advise if ok to place new order, thank you!

## 2022-12-11 NOTE — Telephone Encounter (Signed)
Call placed to patient. LMTRC.   MyChart message also sent.

## 2022-12-15 ENCOUNTER — Encounter: Payer: Self-pay | Admitting: Family Medicine

## 2022-12-15 ENCOUNTER — Other Ambulatory Visit: Payer: Self-pay | Admitting: Family Medicine

## 2022-12-15 NOTE — Telephone Encounter (Signed)
Order- full PFT     dx Asthma mild intermittent

## 2022-12-16 ENCOUNTER — Other Ambulatory Visit: Payer: Self-pay

## 2022-12-16 DIAGNOSIS — J4520 Mild intermittent asthma, uncomplicated: Secondary | ICD-10-CM

## 2022-12-16 NOTE — Telephone Encounter (Signed)
Patient has been scheduled for PFT. Closing encounter. nfn

## 2023-01-05 ENCOUNTER — Ambulatory Visit (INDEPENDENT_AMBULATORY_CARE_PROVIDER_SITE_OTHER): Payer: Self-pay | Admitting: Internal Medicine

## 2023-01-05 DIAGNOSIS — J4520 Mild intermittent asthma, uncomplicated: Secondary | ICD-10-CM

## 2023-01-05 LAB — PULMONARY FUNCTION TEST
DL/VA % pred: 97 %
DL/VA: 4.04 ml/min/mmHg/L
DLCO cor % pred: 91 %
DLCO cor: 19.06 ml/min/mmHg
DLCO unc % pred: 91 %
DLCO unc: 19.06 ml/min/mmHg
FEF 25-75 Post: 0.68 L/sec
FEF 25-75 Pre: 0.46 L/sec
FEF2575-%Change-Post: 47 %
FEF2575-%Pred-Post: 29 %
FEF2575-%Pred-Pre: 19 %
FEV1-%Change-Post: 30 %
FEV1-%Pred-Post: 60 %
FEV1-%Pred-Pre: 46 %
FEV1-Post: 1.57 L
FEV1-Pre: 1.2 L
FEV1FVC-%Change-Post: 30 %
FEV1FVC-%Pred-Pre: 49 %
FEV6-%Change-Post: 6 %
FEV6-%Pred-Post: 95 %
FEV6-%Pred-Pre: 89 %
FEV6-Post: 3.1 L
FEV6-Pre: 2.91 L
FEV6FVC-%Change-Post: 6 %
FEV6FVC-%Pred-Post: 103 %
FEV6FVC-%Pred-Pre: 97 %
FVC-%Change-Post: 0 %
FVC-%Pred-Post: 92 %
FVC-%Pred-Pre: 92 %
FVC-Post: 3.12 L
FVC-Pre: 3.11 L
Post FEV1/FVC ratio: 50 %
Post FEV6/FVC ratio: 99 %
Pre FEV1/FVC ratio: 39 %
Pre FEV6/FVC Ratio: 94 %
RV % pred: 87 %
RV: 1.82 L
TLC % pred: 96 %
TLC: 5.04 L

## 2023-01-05 NOTE — Progress Notes (Signed)
Full PFT performed today. °

## 2023-01-05 NOTE — Patient Instructions (Signed)
Full PFT performed today. °

## 2023-01-07 ENCOUNTER — Other Ambulatory Visit: Payer: Self-pay

## 2023-01-07 ENCOUNTER — Ambulatory Visit (INDEPENDENT_AMBULATORY_CARE_PROVIDER_SITE_OTHER): Payer: BC Managed Care – PPO | Admitting: Family Medicine

## 2023-01-07 ENCOUNTER — Encounter (INDEPENDENT_AMBULATORY_CARE_PROVIDER_SITE_OTHER): Payer: Self-pay | Admitting: Family Medicine

## 2023-01-07 VITALS — BP 107/73 | HR 61 | Temp 98.1°F | Ht 65.0 in | Wt 188.0 lb

## 2023-01-07 DIAGNOSIS — I152 Hypertension secondary to endocrine disorders: Secondary | ICD-10-CM

## 2023-01-07 DIAGNOSIS — E669 Obesity, unspecified: Secondary | ICD-10-CM

## 2023-01-07 DIAGNOSIS — Z6830 Body mass index (BMI) 30.0-30.9, adult: Secondary | ICD-10-CM

## 2023-01-07 DIAGNOSIS — E1159 Type 2 diabetes mellitus with other circulatory complications: Secondary | ICD-10-CM | POA: Diagnosis not present

## 2023-01-07 DIAGNOSIS — E1169 Type 2 diabetes mellitus with other specified complication: Secondary | ICD-10-CM

## 2023-01-07 DIAGNOSIS — J4520 Mild intermittent asthma, uncomplicated: Secondary | ICD-10-CM

## 2023-01-07 DIAGNOSIS — Z7985 Long-term (current) use of injectable non-insulin antidiabetic drugs: Secondary | ICD-10-CM

## 2023-01-07 MED ORDER — OZEMPIC (2 MG/DOSE) 8 MG/3ML ~~LOC~~ SOPN
2.0000 mg | PEN_INJECTOR | SUBCUTANEOUS | 0 refills | Status: AC
Start: 2023-01-07 — End: ?

## 2023-01-07 NOTE — Progress Notes (Signed)
Carlye Grippe, D.O.  ABFM, ABOM Specializing in Clinical Bariatric Medicine  Office located at: [address] [place], [place]  [address]     Assessment and Plan:   Medications Discontinued During This Encounter  Medication Reason   Semaglutide, 2 MG/DOSE, (OZEMPIC, 2 MG/DOSE,) 8 MG/3ML SOPN Reorder     Meds ordered this encounter  Medications   Semaglutide, 2 MG/DOSE, (OZEMPIC, 2 MG/DOSE,) 8 MG/3ML SOPN    Sig: Inject 2 mg into the skin once a week.    Dispense:  3 mL    Refill:  0     Type 2 diabetes mellitus with other specified complication, unspecified whether long term insulin use (HCC) Assessment & Plan: Lab Results  Component Value Date   HGBA1C 5.7 (H) 11/02/2022   HGBA1C 5.7 (H) 07/08/2022   HGBA1C 5.9 (H) 04/06/2022   INSULIN 10.3 07/02/2022   INSULIN 19.5 12/03/2021   INSULIN 13.2 07/08/2021    DM treated with Ozempic 2 mg once a wk - denies any GI upset, tolerating well - Will refill today, no change in dose. Was on Mounjaro in the past, however med was discontinued due to GI side effects. Continue to decrease simple carbs/ sugars; increase fiber and proteins -> follow her meal plan. Will continue to monitor condition.   Hypertension associated with type 2 diabetes mellitus [facility]) Assessment & Plan: BP Readings from Last 3 Encounters:  01/07/23 107/73  12/08/22 109/70  12/03/22 124/80   HTN treated with Benicar 40 mg daily, tolerating well without any adverse effects. Blood pressure stable. Denies any dizziness/lightheadedness.No concerns in this regard today. Maintain with Benicar per Dr.Tower. C/w prudent nutritional plan and low sodium diet, advance exercise as tolerated.    BMI 30.0-30.9,adult - curent BMI 31.28 Obesity, Beginning BMI 38.11 Assessment & Plan: Adrienne Mocha is here to discuss her progress with her obesity treatment plan along with follow-up of her obesity related diagnoses. See Medical Weight Management Flowsheet for  complete bioelectrical impedance results.  Since last office visit on 12/08/22 patient's muscle mass has not changed. Fat mass has increased by 2.6 lb. Total body water has increased by 2.6 lb. Counseling done on how various foods will affect these numbers and how to maximize success  Total lbs lost to date: 41 lbs Total weight loss percentage to date: 17.90%  No change to meal plan - see Subjective  Recommended [employer] Protein Chips as a snack. Also, strategized ways that she can make healthier choices when eating out.   Also discussed ways she can increase her lean protein intake (e.g cottage cheese, egg whites, greek yogurt, Fairlife Milk, Edamame/Black Bean Spaghetti, [place] Jerkey). Overall, recommended Corrie Dandy to have 6 ounces of lean protein at lunch and 8 ounces at dinner.   Behavioral Intervention Additional resources provided today:  Healthy Eating Principles Handout Evidence-based interventions for health behavior change were utilized today including the discussion of self monitoring techniques, problem-solving barriers and SMART goal setting techniques.   Regarding patient's less desirable eating habits and patterns, we employed the technique of small changes.  Pt will specifically work on: increasing lean protein intake and drinking at least 100 ounces of water daily for next visit.    FOLLOW UP: Return 02/09/23. She was informed of the importance of frequent follow up visits to maximize her success with intensive lifestyle modifications for her multiple health conditions.  Subjective:   Chief complaint: Obesity [place] is here to discuss her progress with her obesity treatment plan.  She is on the Category 2 Plan and states she is following her eating plan approximately 50% of the time. She states she is walking/doing PT 30 minutes 3 days per week.  Interval History:  Adrienne Mocha is here for a f/up office visit. Returned from a vacation to [place] and notes being very  physically active there. Since returning, she reports not eating very well. Reports that her partner has been keeping sweets at home & that it has been difficult to exercise restraint. Today, she inquires about "grab and go" foods that are high in protein.   Pharmacotherapy for weight loss: She is currently taking  Ozempic 2 mg once a wk   for medical weight loss.  Denies side effects.    Review of Systems:  Pertinent positives were addressed with patient today.  Reviewed by clinician on day of visit: allergies, medications, problem list, medical history, surgical history, family history, social history, and previous encounter notes.  Weight Summary and Biometrics   Weight Lost Since Last Visit: 0lb  Weight Gained Since Last Visit: 3lb   Vitals Temp: 98.1 F (36.7 C) BP: 107/73 Pulse Rate: 61 SpO2: 98 %   Anthropometric Measurements Height: 5\' 5"  (1.651 m) Weight: 188 lb (85.3 kg) BMI (Calculated): 31.28 Weight at Last Visit: 185lb Weight Lost Since Last Visit: 0lb Weight Gained Since Last Visit: 3lb Starting Weight: 229lb Total Weight Loss (lbs): 41 lb (18.6 kg)   Body Composition  Body Fat %: 40.9 % Fat Mass (lbs): 77 lbs Muscle Mass (lbs): 105.8 lbs Total Body Water (lbs): 77 lbs Visceral Fat Rating : 11   Other Clinical Data Fasting: no Labs: no Today's Visit #: 33 Starting Date: 06/27/20   Objective:   PHYSICAL EXAM: Blood pressure 107/73, pulse 61, temperature 98.1 F (36.7 C), height 5\' 5"  (1.651 m), weight 188 lb (85.3 kg), last menstrual period 10/08/2018, SpO2 98%. Body mass index is 31.28 kg/m.  General: Well Developed, well nourished, and in no acute distress.  HEENT: Normocephalic, atraumatic Skin: Warm and dry, cap RF less 2 sec, good turgor Chest:  Normal excursion, shape, no gross abn Respiratory: speaking in full sentences, no conversational dyspnea NeuroM-Sk: Ambulates w/o assistance, moves * 4 Psych: A and O *3, insight good,  mood-full  DIAGNOSTIC DATA REVIEWED:  BMET    Component Value Date/Time   NA 142 07/02/2022 1058   K 4.6 07/02/2022 1058   CL 102 07/02/2022 1058   CO2 23 07/02/2022 1058   GLUCOSE 87 07/02/2022 1058   GLUCOSE 94 02/06/2022 1033   BUN 15 07/02/2022 1058   CREATININE 0.68 07/02/2022 1058   CALCIUM 9.8 07/02/2022 1058   GFRNONAA 98 06/27/2020 0840   GFRAA 113 06/27/2020 0840   Lab Results  Component Value Date   HGBA1C 5.7 (H) 11/02/2022   HGBA1C 6.5 (H) 03/02/2008   Lab Results  Component Value Date   INSULIN 10.3 07/02/2022   INSULIN 19.9 06/27/2020   Lab Results  Component Value Date   TSH 1.29 02/06/2022   CBC    Component Value Date/Time   WBC 5.8 02/06/2022 1033   RBC 4.17 02/06/2022 1033   HGB 12.4 02/06/2022 1033   HGB 13.0 06/27/2020 0840   HCT 37.0 02/06/2022 1033   HCT 39.0 06/27/2020 0840   PLT 240.0 02/06/2022 1033   PLT 268 06/27/2020 0840   MCV 88.6 02/06/2022 1033   MCV 87 06/27/2020 0840   MCH 28.9 06/27/2020 0840   MCH 28.9 09/23/2018  0404   MCHC 33.5 02/06/2022 1033   RDW 14.2 02/06/2022 1033   RDW 14.3 06/27/2020 0840   Iron Studies    Component Value Date/Time   IRON 62 11/09/2008 1037   FERRITIN 3.9 (L) 11/09/2008 1037   IRONPCTSAT 11.3 (L) 11/09/2008 1037   Lipid Panel     Component Value Date/Time   CHOL 161 07/02/2022 1058   TRIG 66 07/02/2022 1058   HDL 49 07/02/2022 1058   CHOLHDL 3 04/14/2022 0758   VLDL 15.2 04/14/2022 0758   LDLCALC 99 07/02/2022 1058   LDLDIRECT 144.2 09/21/2008 1008   Hepatic Function Panel     Component Value Date/Time   PROT 7.1 07/02/2022 1058   ALBUMIN 4.5 07/02/2022 1058   AST 21 07/02/2022 1058   ALT 24 07/02/2022 1058   ALKPHOS 85 07/02/2022 1058   BILITOT 0.4 07/02/2022 1058   BILIDIR 0.1 03/02/2008 1018      Component Value Date/Time   TSH 1.29 02/06/2022 1033   Nutritional Lab Results  Component Value Date   VD25OH 63.3 11/02/2022   VD25OH 73.9 07/02/2022   VD25OH 33.76  02/06/2022    Attestations:   I, Special Puri, acting as a Stage manager for [employer], DO., have compiled all relevant documentation for today's office visit on behalf of Thomasene Lot, DO, while in the presence of [employer], DO.  I have reviewed the above documentation for accuracy and completeness, and I agree with the above. Carlye Grippe, D.O.  The 21st Century Cures Act was signed into law in 2016 which includes the topic of electronic health records.  This provides immediate access to information in MyChart.  This includes consultation notes, operative notes, office notes, lab results and pathology reports.  If you have any questions about what you read please let us know at your next visit so we can discuss your concerns and take corrective action if need be.  We are right here with you.

## 2023-01-17 ENCOUNTER — Encounter: Payer: Self-pay | Admitting: Internal Medicine

## 2023-01-17 DIAGNOSIS — G4733 Obstructive sleep apnea (adult) (pediatric): Secondary | ICD-10-CM

## 2023-01-18 ENCOUNTER — Ambulatory Visit (INDEPENDENT_AMBULATORY_CARE_PROVIDER_SITE_OTHER): Payer: BC Managed Care – PPO | Admitting: Internal Medicine

## 2023-01-18 DIAGNOSIS — J4520 Mild intermittent asthma, uncomplicated: Secondary | ICD-10-CM

## 2023-01-18 LAB — PULMONARY FUNCTION TEST
DL/VA % pred: 89 %
DL/VA: 3.72 ml/min/mmHg/L
DLCO cor % pred: 84 %
DLCO cor: 17.51 ml/min/mmHg
DLCO unc % pred: 84 %
DLCO unc: 17.51 ml/min/mmHg
FEF 25-75 Post: 0.69 L/s
FEF 25-75 Pre: 1.49 L/s
FEF2575-%Change-Post: -54 %
FEF2575-%Pred-Post: 29 %
FEF2575-%Pred-Pre: 64 %
FEV1-%Change-Post: -15 %
FEV1-%Pred-Post: 72 %
FEV1-%Pred-Pre: 85 %
FEV1-Post: 1.89 L
FEV1-Pre: 2.23 L
FEV1FVC-%Change-Post: -12 %
FEV1FVC-%Pred-Pre: 87 %
FEV6-%Change-Post: -5 %
FEV6-%Pred-Post: 95 %
FEV6-%Pred-Pre: 100 %
FEV6-Post: 3.09 L
FEV6-Pre: 3.28 L
FEV6FVC-%Change-Post: 0 %
FEV6FVC-%Pred-Post: 102 %
FEV6FVC-%Pred-Pre: 103 %
FVC-%Change-Post: -3 %
FVC-%Pred-Post: 94 %
FVC-%Pred-Pre: 97 %
FVC-Post: 3.19 L
FVC-Pre: 3.29 L
Post FEV1/FVC ratio: 59 %
Post FEV6/FVC ratio: 99 %
Pre FEV1/FVC ratio: 68 %
Pre FEV6/FVC Ratio: 100 %
RV % pred: 73 %
RV: 1.53 L
TLC % pred: 93 %
TLC: 4.86 L

## 2023-01-18 NOTE — Progress Notes (Signed)
Full PFT performed today. °

## 2023-01-18 NOTE — Patient Instructions (Signed)
Full PFT performed today. °

## 2023-01-21 ENCOUNTER — Encounter: Payer: Self-pay | Admitting: Internal Medicine

## 2023-02-02 DIAGNOSIS — H02834 Dermatochalasis of left upper eyelid: Secondary | ICD-10-CM | POA: Diagnosis not present

## 2023-02-02 DIAGNOSIS — H02831 Dermatochalasis of right upper eyelid: Secondary | ICD-10-CM | POA: Diagnosis not present

## 2023-02-02 DIAGNOSIS — H40033 Anatomical narrow angle, bilateral: Secondary | ICD-10-CM | POA: Diagnosis not present

## 2023-02-02 LAB — HM DIABETES EYE EXAM

## 2023-02-04 DIAGNOSIS — M25511 Pain in right shoulder: Secondary | ICD-10-CM | POA: Diagnosis not present

## 2023-02-04 DIAGNOSIS — Z23 Encounter for immunization: Secondary | ICD-10-CM | POA: Diagnosis not present

## 2023-02-09 ENCOUNTER — Ambulatory Visit (INDEPENDENT_AMBULATORY_CARE_PROVIDER_SITE_OTHER): Payer: BC Managed Care – PPO | Admitting: Family Medicine

## 2023-02-09 DIAGNOSIS — G4733 Obstructive sleep apnea (adult) (pediatric): Secondary | ICD-10-CM | POA: Diagnosis not present

## 2023-02-11 ENCOUNTER — Encounter (INDEPENDENT_AMBULATORY_CARE_PROVIDER_SITE_OTHER): Payer: Self-pay | Admitting: Family Medicine

## 2023-02-11 ENCOUNTER — Ambulatory Visit (INDEPENDENT_AMBULATORY_CARE_PROVIDER_SITE_OTHER): Payer: BC Managed Care – PPO | Admitting: Family Medicine

## 2023-02-11 ENCOUNTER — Other Ambulatory Visit: Payer: Self-pay | Admitting: Family Medicine

## 2023-02-11 VITALS — BP 112/74 | HR 78 | Temp 98.3°F | Ht 65.0 in | Wt 189.0 lb

## 2023-02-11 DIAGNOSIS — E669 Obesity, unspecified: Secondary | ICD-10-CM | POA: Diagnosis not present

## 2023-02-11 DIAGNOSIS — Z6831 Body mass index (BMI) 31.0-31.9, adult: Secondary | ICD-10-CM | POA: Diagnosis not present

## 2023-02-11 DIAGNOSIS — R252 Cramp and spasm: Secondary | ICD-10-CM | POA: Diagnosis not present

## 2023-02-11 DIAGNOSIS — E1169 Type 2 diabetes mellitus with other specified complication: Secondary | ICD-10-CM | POA: Diagnosis not present

## 2023-02-11 DIAGNOSIS — Z7985 Long-term (current) use of injectable non-insulin antidiabetic drugs: Secondary | ICD-10-CM

## 2023-02-11 MED ORDER — OZEMPIC (2 MG/DOSE) 8 MG/3ML ~~LOC~~ SOPN: 2.0000 mg | PEN_INJECTOR | SUBCUTANEOUS | 0 refills | Status: DC

## 2023-02-12 LAB — CMP14+EGFR
ALT: 23 [IU]/L (ref 0–32)
AST: 20 [IU]/L (ref 0–40)
Albumin: 4.2 g/dL (ref 3.9–4.9)
Alkaline Phosphatase: 81 [IU]/L (ref 44–121)
BUN/Creatinine Ratio: 34 — ABNORMAL HIGH (ref 12–28)
BUN: 22 mg/dL (ref 8–27)
Bilirubin Total: 0.3 mg/dL (ref 0.0–1.2)
CO2: 27 mmol/L (ref 20–29)
Calcium: 9.3 mg/dL (ref 8.7–10.3)
Chloride: 102 mmol/L (ref 96–106)
Creatinine, Ser: 0.65 mg/dL (ref 0.57–1.00)
Globulin, Total: 2.6 g/dL (ref 1.5–4.5)
Glucose: 79 mg/dL (ref 70–99)
Potassium: 4.3 mmol/L (ref 3.5–5.2)
Sodium: 142 mmol/L (ref 134–144)
Total Protein: 6.8 g/dL (ref 6.0–8.5)
eGFR: 99 mL/min/{1.73_m2} (ref >59)

## 2023-02-12 LAB — HEPATITIS B CORE AB W/REFLEX: Hep B Core Total Ab: NEGATIVE

## 2023-02-12 LAB — CK: Total CK: 247 U/L — ABNORMAL HIGH (ref 32–182)

## 2023-02-15 ENCOUNTER — Other Ambulatory Visit (INDEPENDENT_AMBULATORY_CARE_PROVIDER_SITE_OTHER): Payer: Self-pay | Admitting: Family Medicine

## 2023-02-15 ENCOUNTER — Encounter (INDEPENDENT_AMBULATORY_CARE_PROVIDER_SITE_OTHER): Payer: Self-pay | Admitting: Family Medicine

## 2023-02-15 NOTE — Progress Notes (Signed)
Chief Complaint:   OBESITY Patty Williams is here to discuss her progress with her obesity treatment plan along with follow-up of her obesity related diagnoses. Patty Williams is on the Category 2 Plan and states she is following her eating plan approximately 50% of the time. Patty Williams states she is exercising occasionally for 20 minutes every other day.    Today's visit was #: 34 Starting weight: 229 lbs Starting date: 06/27/2020 Today's weight: 189 lbs Today's date: 02/11/2023 Total lbs lost to date: 40 Total lbs lost since last in-office visit: 0  Interim History: Patient continues to work on meeting her protein goals.  Her calories are sometimes higher than ideal, but she is still mindful.  Her hunger is mostly controlled.  Subjective:   1. Leg cramping Patient notes increased cramping mostly lower extremity, and no improvement with OTC magnesium.  Patient is not hydrating well lately.  2. Type 2 diabetes mellitus with other specified complication, unspecified whether long term insulin use (HCC) Patient is working on her diet, and she is tolerating her medications well.  She denies nausea, vomiting, or hypoglycemia.  Assessment/Plan:   1. Leg cramping We will check labs today.  Patient was referred to Dr. Denyse Amass, sports medicine.  She is to work on increasing her water intake to 80+ ounces per day.  - CMP14+EGFR - CK - Hep B Core Ab W/Reflex - Ambulatory referral to Sports Medicine  2. Type 2 diabetes mellitus with other specified complication, unspecified whether long term insulin use (HCC) Patient will continue her medications, and we will refill Ozempic 2 mg once weekly for 1 month.  - Semaglutide, 2 MG/DOSE, (OZEMPIC, 2 MG/DOSE,) 8 MG/3ML SOPN; Inject 2 mg into the skin once a week.  Dispense: 3 mL; Refill: 0  3. BMI 31.0-31.9,adult - current BMI 31.12  4. Obesity, Beginning BMI 38.11 Patty Williams is currently in the action stage of change. As such, her goal is to continue with weight loss  efforts. She has agreed to the Category 2 Plan.   Exercise goals: As is. For substantial health benefits, adults should do at least 150 minutes (2 hours and 30 minutes) a week of moderate-intensity, or 75 minutes (1 hour and 15 minutes) a week of vigorous-intensity aerobic physical activity, or an equivalent combination of moderate- and vigorous-intensity aerobic activity. Aerobic activity should be performed in episodes of at least 10 minutes, and preferably, it should be spread throughout the week.  Behavioral modification strategies: increasing water intake.  Patty Williams has agreed to follow-up with our clinic in 4 weeks. She was informed of the importance of frequent follow-up visits to maximize her success with intensive lifestyle modifications for her multiple health conditions.   Patty Williams was informed we would discuss her lab results at her next visit unless there is a critical issue that needs to be addressed sooner. Patty Williams agreed to keep her next visit at the agreed upon time to discuss these results.  Objective:   Blood pressure 112/74, pulse 78, temperature 98.3 F (36.8 C), height 5\' 5"  (1.651 m), weight 189 lb (85.7 kg), last menstrual period 10/08/2018, SpO2 97%. Body mass index is 31.45 kg/m.  General: Cooperative, alert, well developed, in no acute distress. HEENT: Conjunctivae and lids unremarkable. Cardiovascular: Regular rhythm.  Lungs: Normal work of breathing. Neurologic: No focal deficits.   Lab Results  Component Value Date   CREATININE 0.65 02/11/2023   BUN 22 02/11/2023   NA 142 02/11/2023   K 4.3 02/11/2023   CL 102  02/11/2023   CO2 27 02/11/2023   Lab Results  Component Value Date   ALT 23 02/11/2023   AST 20 02/11/2023   ALKPHOS 81 02/11/2023   BILITOT 0.3 02/11/2023   Lab Results  Component Value Date   HGBA1C 5.7 (H) 11/02/2022   HGBA1C 5.7 (H) 07/08/2022   HGBA1C 5.9 (H) 04/06/2022   HGBA1C 5.7 (H) 12/03/2021   HGBA1C 5.9 (H) 07/08/2021   Lab Results   Component Value Date   INSULIN 10.3 07/02/2022   INSULIN 19.5 12/03/2021   INSULIN 13.2 07/08/2021   INSULIN 22.6 11/06/2020   INSULIN 19.9 06/27/2020   Lab Results  Component Value Date   TSH 1.29 02/06/2022   Lab Results  Component Value Date   CHOL 161 07/02/2022   HDL 49 07/02/2022   LDLCALC 99 07/02/2022   LDLDIRECT 144.2 09/21/2008   TRIG 66 07/02/2022   CHOLHDL 3 04/14/2022   Lab Results  Component Value Date   VD25OH 63.3 11/02/2022   VD25OH 73.9 07/02/2022   VD25OH 33.76 02/06/2022   Lab Results  Component Value Date   WBC 5.8 02/06/2022   HGB 12.4 02/06/2022   HCT 37.0 02/06/2022   MCV 88.6 02/06/2022   PLT 240.0 02/06/2022   Lab Results  Component Value Date   IRON 62 11/09/2008   FERRITIN 3.9 (L) 11/09/2008   Attestation Statements:   Reviewed by clinician on day of visit: allergies, medications, problem list, medical history, surgical history, family history, social history, and previous encounter notes.   I, Burt Knack, am acting as transcriptionist for Quillian Quince, MD.  I have reviewed the above documentation for accuracy and completeness, and I agree with the above. -  Quillian Quince, MD

## 2023-02-16 ENCOUNTER — Other Ambulatory Visit: Payer: Self-pay

## 2023-02-16 ENCOUNTER — Ambulatory Visit (INDEPENDENT_AMBULATORY_CARE_PROVIDER_SITE_OTHER): Payer: BC Managed Care – PPO | Admitting: Family Medicine

## 2023-02-16 ENCOUNTER — Encounter: Payer: Self-pay | Admitting: Family Medicine

## 2023-02-16 VITALS — BP 130/88 | HR 63 | Ht 65.0 in | Wt 196.0 lb

## 2023-02-16 DIAGNOSIS — M79622 Pain in left upper arm: Secondary | ICD-10-CM

## 2023-02-16 DIAGNOSIS — R748 Abnormal levels of other serum enzymes: Secondary | ICD-10-CM | POA: Diagnosis not present

## 2023-02-16 DIAGNOSIS — M791 Myalgia, unspecified site: Secondary | ICD-10-CM

## 2023-02-16 DIAGNOSIS — G8929 Other chronic pain: Secondary | ICD-10-CM | POA: Diagnosis not present

## 2023-02-16 DIAGNOSIS — M533 Sacrococcygeal disorders, not elsewhere classified: Secondary | ICD-10-CM | POA: Diagnosis not present

## 2023-02-16 LAB — SEDIMENTATION RATE: Sed Rate: 32 mm/h — ABNORMAL HIGH (ref 0–30)

## 2023-02-16 LAB — C-REACTIVE PROTEIN: CRP: <1 mg/dL (ref 0.5–20.0)

## 2023-02-16 NOTE — Progress Notes (Signed)
I, Stevenson Clinch, CMA acting as a scribe for Clementeen Graham, MD.  Patty Williams is a 62 y.o. female who presents to [facility] at [facility] today for leg cramping x several months. She is a pt at [facility], and labs were checked and she was advised to increase her water intake to 80+ oz/day. Had elevated CK lab 02/11/23. Pt locates pain to bilateral calves. Hx of lower back injury 2-3 years ago, limiting with ADL's; worsening sx with prolonged time in any position. Sx also worse when bending at the waist, doing house chores and walking the dog. Denies bowel/bladder dysfunction. Notes pain radiating into the gluteal region, R>L. Denies n/t in the LE but notes pain in B LE, R>L.  Also notes pain in the left arm x 1 week, worse when reaching out and when deep breathing. Notes chronic neck pain and tightness. Goes for massage and see's [facility] in [place]). Has been seen by [facility] for subclavicular pain and rotator cuff injury to the right shoulder. Hx of trigger thumb, left, with subsequent release. Denies personal or family hx of auto-immune disorder. Currently in PT but this is not covered by insurance, going once monthly (inside out body therapies in [facility]). Also notes hx of frozen shoulder, left.   She notes current discomfort in her left axillary region especially with shoulder abduction.  She feels a bandlike tightness but denies any masses.  She has an upcoming screening mammogram next month.  Aggravates: as above Treatments tried: chiro, massage, acupuncture, PT  Dx testing: 02/11/23 Labs  Pertinent review of systems: No fevers or chills  Relevant historical information: Patient is a dentist. Hypertension controlled diabetes obesity history of frozen shoulder and history of trigger finger.   Exam:  BP 130/88   Pulse 63   Ht 5\' 5"  (1.651 m)   Wt 196 lb (88.9 kg)   LMP 10/08/2018 (Exact Date)   SpO2 98%   BMI  32.62 kg/m  General: Well Developed, well nourished, and in no acute distress.   MSK: L-spine normal appearing. Nontender palpation spinal midline.  Tender palpation right SI joint. Lower extremity strength is intact.  Left shoulder normal-appearing normal motion.  Intact strength.  Negative impingement testing.    Lab and Radiology Results  Procedure: Real-time Ultrasound Guided Injection of right SI joint Device: Philips Affiniti 50G/GE Logiq Images permanently stored and available for review in PACS Verbal informed consent obtained.  Discussed risks and benefits of procedure. Warned about infection, bleeding, hyperglycemia damage to structures among others. Patient expresses understanding and agreement Time-out conducted.   Noted no overlying erythema, induration, or other signs of local infection.   Skin prepped in a sterile fashion.   Local anesthesia: Topical Ethyl chloride.   With sterile technique and under real time ultrasound guidance: 40 mg of Kenalog and 2 ml of Marcaine injected into right SI joint. Fluid seen entering the joint capsule.   Completed without difficulty   Pain immediately resolved suggesting accurate placement of the medication.   Advised to call if fevers/chills, erythema, induration, drainage, or persistent bleeding.   Images permanently stored and available for review in the ultrasound unit.  Impression: Technically successful ultrasound guided injection.   Lab Results  Component Value Date   CKTOTAL 247 (H) 02/11/2023       Assessment and Plan: 62 y.o. female with chronic low back pain chronic shoulder and knee pain and chronic or intermittent adhesive capsulitis  and trigger finger.  Additionally she had an elevated CK when checked recently.  Fundamentally I think from a holistic standpoint dedicated core strengthening is a great idea.  She is already doing that with Pilates PT in [facility].  If needed we can add aquatic physical  therapy or more formalized physical therapy.  For now I think this is a great idea and should be the fundamental treatment strategy.  She does have what seems to be right SI joint pain that appears to be chronic.  She had a immediate great response in clinic today to this injection indicating that is one of her primary pain generators.  Hopefully she will have some longer lasting relief.  She did have an MRI last year of the pelvis that did not show sacroiliitis.  Lastly she has an elevated CK in the setting of myalgias and arthralgias.  Plan for further rheumatologic workup.  She will keep me updated how she is feeling.  Check back as needed.   PDMP not reviewed this encounter. Orders Placed This Encounter  Procedures   [place] LIMITED JOINT SPACE STRUCTURES LOW LEFT(NO LINKED CHARGES)    Order Specific Question:   Reason for Exam (SYMPTOM  OR DIAGNOSIS REQUIRED)    Answer:   SI joint    Order Specific Question:   Preferred imaging location?    Answer:   [facility]   MM 3D DIAGNOSTIC MAMMOGRAM BILATERAL BREAST    Standing Status:   Future    Standing Expiration Date:   02/16/2024    Scheduling Instructions:     [facility]    Order Specific Question:   Reason for Exam (SYMPTOM  OR DIAGNOSIS REQUIRED)    Answer:   left axillary pain. Replaces scheduled screenkng mammo    Order Specific Question:   Preferred imaging location?    Answer:   [facility]   Sedimentation rate    Standing Status:   Future    Number of Occurrences:   1    Standing Expiration Date:   02/16/2024   Rheumatoid factor    Standing Status:   Future    Number of Occurrences:   1    Standing Expiration Date:   02/16/2024   ANA    Standing Status:   Future    Number of Occurrences:   1    Standing Expiration Date:   02/16/2024   HLA-B27 antigen    Standing Status:   Future    Number of Occurrences:   1    Standing Expiration Date:   02/16/2024   C-reactive protein    Standing Status:   Future     Number of Occurrences:   1    Standing Expiration Date:   02/16/2024   No orders of the defined types were placed in this encounter.    Discussed warning signs or symptoms. Please see discharge instructions. Patient expresses understanding.   The above documentation has been reviewed and is accurate and complete Clementeen Graham, M.D.

## 2023-02-16 NOTE — Patient Instructions (Addendum)
Thank you for coming in today.   I think your PT location is a really good idea.   Please get labs today before you leave   I can do injections any time for any of these hot spots.   Call or go to the ER if you develop a large red swollen joint with extreme pain or oozing puss.     I am changing the mammogram to a diagnostic mammogram  Check out these providers: [facility] with PT Pilates Ernesta Amble at [facility] - [email or web address] Rob [email or web address] at [facility] - [email or web address]

## 2023-02-18 LAB — HLA-B27 ANTIGEN: HLA-B27 Antigen: NEGATIVE

## 2023-02-18 LAB — RHEUMATOID FACTOR: Rheumatoid fact SerPl-aCnc: <10 [IU]/mL (ref <14)

## 2023-02-18 LAB — ANTI-NUCLEAR AB-TITER (ANA TITER)
ANA TITER: 1:80 {titer} — ABNORMAL HIGH
ANA Titer 1: 1:80 {titer} — ABNORMAL HIGH

## 2023-02-18 LAB — ANA: Anti Nuclear Antibody (ANA): POSITIVE — AB

## 2023-02-21 ENCOUNTER — Telehealth: Payer: Self-pay | Admitting: Family Medicine

## 2023-02-21 DIAGNOSIS — Z79899 Other long term (current) drug therapy: Secondary | ICD-10-CM

## 2023-02-21 DIAGNOSIS — I10 Essential (primary) hypertension: Secondary | ICD-10-CM

## 2023-02-21 DIAGNOSIS — E119 Type 2 diabetes mellitus without complications: Secondary | ICD-10-CM

## 2023-02-21 DIAGNOSIS — E559 Vitamin D deficiency, unspecified: Secondary | ICD-10-CM

## 2023-02-21 DIAGNOSIS — E1169 Type 2 diabetes mellitus with other specified complication: Secondary | ICD-10-CM

## 2023-02-21 NOTE — Telephone Encounter (Signed)
-----   Message from Alvina Chou sent at 02/18/2023  3:51 PM EDT ----- Regarding: Lab orders for Tue, 10.22.24 Patient is scheduled for CPX labs, please order future labs, Thanks , Camelia Eng

## 2023-02-22 NOTE — Progress Notes (Signed)
Your rheumatologic labs are little bit positive.  I will refer to rheumatology for further evaluation.  Will talk about this more thoroughly during your visit on October 18 with me.

## 2023-02-22 NOTE — Addendum Note (Signed)
Addended by: Rodolph Bong on: 02/22/2023 07:05 AM   Modules accepted: Orders

## 2023-02-25 ENCOUNTER — Encounter (INDEPENDENT_AMBULATORY_CARE_PROVIDER_SITE_OTHER): Payer: Self-pay | Admitting: Family Medicine

## 2023-02-25 ENCOUNTER — Encounter: Payer: Self-pay | Admitting: Family Medicine

## 2023-02-25 DIAGNOSIS — L820 Inflamed seborrheic keratosis: Secondary | ICD-10-CM | POA: Diagnosis not present

## 2023-02-25 DIAGNOSIS — R208 Other disturbances of skin sensation: Secondary | ICD-10-CM | POA: Diagnosis not present

## 2023-02-25 DIAGNOSIS — Z0184 Encounter for antibody response examination: Secondary | ICD-10-CM | POA: Insufficient documentation

## 2023-02-25 DIAGNOSIS — L821 Other seborrheic keratosis: Secondary | ICD-10-CM | POA: Diagnosis not present

## 2023-02-25 DIAGNOSIS — L538 Other specified erythematous conditions: Secondary | ICD-10-CM | POA: Diagnosis not present

## 2023-02-26 ENCOUNTER — Ambulatory Visit (INDEPENDENT_AMBULATORY_CARE_PROVIDER_SITE_OTHER): Payer: BC Managed Care – PPO | Admitting: Family Medicine

## 2023-02-26 ENCOUNTER — Ambulatory Visit (INDEPENDENT_AMBULATORY_CARE_PROVIDER_SITE_OTHER): Payer: BC Managed Care – PPO

## 2023-02-26 VITALS — BP 118/68 | HR 61 | Ht 65.0 in | Wt 193.0 lb

## 2023-02-26 DIAGNOSIS — M4802 Spinal stenosis, cervical region: Secondary | ICD-10-CM | POA: Diagnosis not present

## 2023-02-26 DIAGNOSIS — M546 Pain in thoracic spine: Secondary | ICD-10-CM | POA: Diagnosis not present

## 2023-02-26 DIAGNOSIS — G8929 Other chronic pain: Secondary | ICD-10-CM

## 2023-02-26 DIAGNOSIS — M47816 Spondylosis without myelopathy or radiculopathy, lumbar region: Secondary | ICD-10-CM | POA: Diagnosis not present

## 2023-02-26 DIAGNOSIS — M533 Sacrococcygeal disorders, not elsewhere classified: Secondary | ICD-10-CM

## 2023-02-26 DIAGNOSIS — M545 Low back pain, unspecified: Secondary | ICD-10-CM

## 2023-02-26 DIAGNOSIS — M542 Cervicalgia: Secondary | ICD-10-CM | POA: Diagnosis not present

## 2023-02-26 DIAGNOSIS — R748 Abnormal levels of other serum enzymes: Secondary | ICD-10-CM | POA: Diagnosis not present

## 2023-02-26 DIAGNOSIS — M47812 Spondylosis without myelopathy or radiculopathy, cervical region: Secondary | ICD-10-CM | POA: Diagnosis not present

## 2023-02-26 DIAGNOSIS — M791 Myalgia, unspecified site: Secondary | ICD-10-CM | POA: Diagnosis not present

## 2023-02-26 DIAGNOSIS — M419 Scoliosis, unspecified: Secondary | ICD-10-CM | POA: Diagnosis not present

## 2023-02-26 DIAGNOSIS — M79622 Pain in left upper arm: Secondary | ICD-10-CM

## 2023-02-26 DIAGNOSIS — M48061 Spinal stenosis, lumbar region without neurogenic claudication: Secondary | ICD-10-CM | POA: Diagnosis not present

## 2023-02-26 DIAGNOSIS — M47814 Spondylosis without myelopathy or radiculopathy, thoracic region: Secondary | ICD-10-CM | POA: Diagnosis not present

## 2023-02-26 DIAGNOSIS — M4316 Spondylolisthesis, lumbar region: Secondary | ICD-10-CM | POA: Diagnosis not present

## 2023-02-26 NOTE — Patient Instructions (Addendum)
Thank you for coming in today.   The Pain Management Workbook: Powerful CBT and Mindfulness Skills to Take Control of Pain and Reclaim Your Life ThisMLS.nl  Please get an Xray today before you leave   Let me know how it goes with rheumatology  Check back in 6 weeks

## 2023-02-26 NOTE — Progress Notes (Signed)
Rubin Payor, PhD, LAT, ATC acting as a scribe for Clementeen Graham, MD.  Patty Williams is a 62 y.o. female who presents to [facility] at [facility] today for f/u on lab results and LBP. Pt was last seen by Dr. Denyse Amass on 02/16/23 and was given a R SI joint steroid injection and further rheumatologic labs were obtained. Based on findings, she was referred to rheumatology.  Today, pt reports SI joint pain is remarkably improved since CSI at last visit. She feels more movement. She is scheduled to see rheum next week.  She notes pain neck upper back low back.  She is doing intermittent physical therapy and PT Pilates.  Dx testing: 02/16/23 Labs 02/11/23 Labs   Pertinent review of systems: No fevers or chills  Relevant historical information: Hypertension.  Diabetes.   Exam:  BP 118/68   Pulse 61   Ht 5\' 5"  (1.651 m)   Wt 193 lb (87.5 kg)   LMP 10/08/2018 (Exact Date)   SpO2 96%   BMI 32.12 kg/m  General: Well Developed, well nourished, and in no acute distress.   MSK: T-spine normal motion.  L-spine normal motion. Normal gait.   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  No acute fractures are present.  T-spine: Mild DDD mid thoracic region.  No acute fractures are visible.  No significant scoliosis is present.  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  No acute fractures are visible.  Sacrum: SI DJD bilaterally.  No acute fractures.  Await formal radiology review  Component     Latest Ref Rng 11/02/2022 02/11/2023 02/16/2023  Glucose     70 - 99 mg/dL  79    BUN     8 - 27 mg/dL  22    Creatinine     1.61 - 1.00 mg/dL  0.96    eGFR     >04 VW/UJW/1.19  99    BUN/Creatinine Ratio     12 - 28   34 (H)    Sodium     134 - 144 mmol/L  142    Potassium     3.5 - 5.2 mmol/L  4.3    Chloride     96 - 106 mmol/L  102    CO2     20 - 29 mmol/L   27    Calcium     8.7 - 10.3 mg/dL  9.3    Total Protein     6.0 - 8.5 g/dL  6.8    Albumin     3.9 - 4.9 g/dL  4.2    Globulin, Total     1.5 - 4.5 g/dL  2.6    Total Bilirubin     0.0 - 1.2 mg/dL  0.3    Alkaline Phosphatase     44 - 121 IU/L  81    AST     0 - 40 IU/L  20    ALT     0 - 32 IU/L  23    ANA Titer 1     titer   1:80 (H)   ANA Pattern 1   Nuclear, Nucleolar !   ANA TITER     titer   1:80 (H)   ANA PATTERN   Cytoplasmic !   Hemoglobin A1C     4.8 - 5.6 % 5.7 (H)     Est. average glucose Bld  gHb Est-mCnc     mg/dL 433     Vitamin D, 29-JJOACZY     30.0 - 100.0 ng/mL 63.3     CK Total     32 - 182 U/L  247 (H)    Hep B Core Total Ab     Negative   Negative    CRP     0.5 - 20.0 mg/dL   <6.0   [id number] Antigen     NEGATIVE    NEGATIVE   Anti Nuclear Antibody (ANA)     NEGATIVE    POSITIVE !   RA Latex Turbid.     <14 IU/mL   <10   Sed Rate     0 - 30 mm/hr   32 (H)     Legend: (H) High ! Abnormal    Assessment and Plan: 62 y.o. female with chronic low back pain thought to be primarily SI joint related.  She may have some anterior listhesis causing pain as well.  She has had a good trial of physical therapy already.  She had a great response to SI injection in clinic the other day.  Could repeat injection if needed or proceed with block and ablations SI joint.  Additionally she has upper back and neck pain thought to be partially muscle spasm and dysfunction and partially degenerative changes.  Physical therapy is helpful.  Lastly she has lower overall myalgias arthralgias situation with elevated CK and ANA titer.  HLA-B27 was negative.  She has not had initial establishment appointment with rheumatology next week.  This should be helpful.  Recheck 6 weeks.  PDMP not reviewed this encounter. Orders Placed This Encounter  Procedures   DG Cervical Spine 2 or 3 views    Standing Status:   Future    Number of Occurrences:   1    Standing  Expiration Date:   02/26/2024    Order Specific Question:   Reason for Exam (SYMPTOM  OR DIAGNOSIS REQUIRED)    Answer:   myalgia    Order Specific Question:   Preferred imaging location?    Answer:   [facility]   DG Thoracic Spine W/Swimmers    Standing Status:   Future    Number of Occurrences:   1    Standing Expiration Date:   02/26/2024    Order Specific Question:   Reason for Exam (SYMPTOM  OR DIAGNOSIS REQUIRED)    Answer:   thoracic back pain    Order Specific Question:   Preferred imaging location?    Answer:   [facility]   DG Lumbar Spine 2-3 Views    Standing Status:   Future    Number of Occurrences:   1    Standing Expiration Date:   03/29/2023    Order Specific Question:   Reason for Exam (SYMPTOM  OR DIAGNOSIS REQUIRED)    Answer:   myalgia    Order Specific Question:   Preferred imaging location?    Answer:   [facility]   DG Sacrum/Coccyx    Standing Status:   Future    Number of Occurrences:   1    Standing Expiration Date:   02/26/2024    Order Specific Question:   Reason for Exam (SYMPTOM  OR DIAGNOSIS REQUIRED)    Answer:   myalgia    Order Specific Question:   Preferred imaging location?    Answer:   [facility]   No orders of the  defined types were placed in this encounter.    Discussed warning signs or symptoms. Please see discharge instructions. Patient expresses understanding.   The above documentation has been reviewed and is accurate and complete Clementeen Graham, M.D.

## 2023-03-02 ENCOUNTER — Other Ambulatory Visit: Payer: BC Managed Care – PPO

## 2023-03-02 ENCOUNTER — Ambulatory Visit (INDEPENDENT_AMBULATORY_CARE_PROVIDER_SITE_OTHER): Payer: BC Managed Care – PPO | Admitting: Family Medicine

## 2023-03-05 ENCOUNTER — Other Ambulatory Visit (INDEPENDENT_AMBULATORY_CARE_PROVIDER_SITE_OTHER): Payer: BC Managed Care – PPO

## 2023-03-05 DIAGNOSIS — E785 Hyperlipidemia, unspecified: Secondary | ICD-10-CM

## 2023-03-05 DIAGNOSIS — E1169 Type 2 diabetes mellitus with other specified complication: Secondary | ICD-10-CM

## 2023-03-05 DIAGNOSIS — E559 Vitamin D deficiency, unspecified: Secondary | ICD-10-CM

## 2023-03-05 DIAGNOSIS — I10 Essential (primary) hypertension: Secondary | ICD-10-CM

## 2023-03-05 DIAGNOSIS — Z0184 Encounter for antibody response examination: Secondary | ICD-10-CM | POA: Diagnosis not present

## 2023-03-05 DIAGNOSIS — Z79899 Other long term (current) drug therapy: Secondary | ICD-10-CM

## 2023-03-05 DIAGNOSIS — E119 Type 2 diabetes mellitus without complications: Secondary | ICD-10-CM

## 2023-03-05 LAB — COMPREHENSIVE METABOLIC PANEL
ALT: 23 U/L (ref 0–35)
AST: 20 U/L (ref 0–37)
Albumin: 4.2 g/dL (ref 3.5–5.2)
Alkaline Phosphatase: 76 U/L (ref 39–117)
BUN: 19 mg/dL (ref 6–23)
CO2: 32 meq/L (ref 19–32)
Calcium: 9.4 mg/dL (ref 8.4–10.5)
Chloride: 104 meq/L (ref 96–112)
Creatinine, Ser: 0.64 mg/dL (ref 0.40–1.20)
GFR: 94.36 mL/min (ref >60.00)
Glucose, Bld: 98 mg/dL (ref 70–99)
Potassium: 4.4 meq/L (ref 3.5–5.1)
Sodium: 144 meq/L (ref 135–145)
Total Bilirubin: 0.6 mg/dL (ref 0.2–1.2)
Total Protein: 6.9 g/dL (ref 6.0–8.3)

## 2023-03-05 LAB — LIPID PANEL
Cholesterol: 201 mg/dL — ABNORMAL HIGH (ref 0–200)
HDL: 55.7 mg/dL (ref >39.00)
LDL Cholesterol: 127 mg/dL — ABNORMAL HIGH (ref 0–99)
NonHDL: 145.21
Total CHOL/HDL Ratio: 4
Triglycerides: 92 mg/dL (ref 0.0–149.0)
VLDL: 18.4 mg/dL (ref 0.0–40.0)

## 2023-03-05 LAB — CBC WITH DIFFERENTIAL/PLATELET
Basophils Absolute: 0 10*3/uL (ref 0.0–0.1)
Basophils Relative: 0.4 % (ref 0.0–3.0)
Eosinophils Absolute: 0.1 10*3/uL (ref 0.0–0.7)
Eosinophils Relative: 1.3 % (ref 0.0–5.0)
HCT: 40.3 % (ref 36.0–46.0)
Hemoglobin: 13 g/dL (ref 12.0–15.0)
Lymphocytes Relative: 36.6 % (ref 12.0–46.0)
Lymphs Abs: 2.3 10*3/uL (ref 0.7–4.0)
MCHC: 32.1 g/dL (ref 30.0–36.0)
MCV: 93.3 fL (ref 78.0–100.0)
Monocytes Absolute: 0.6 10*3/uL (ref 0.1–1.0)
Monocytes Relative: 9.2 % (ref 3.0–12.0)
Neutro Abs: 3.3 10*3/uL (ref 1.4–7.7)
Neutrophils Relative %: 52.5 % (ref 43.0–77.0)
Platelets: 238 10*3/uL (ref 150.0–400.0)
RBC: 4.33 Mil/uL (ref 3.87–5.11)
RDW: 14.2 % (ref 11.5–15.5)
WBC: 6.3 10*3/uL (ref 4.0–10.5)

## 2023-03-05 LAB — VITAMIN D 25 HYDROXY (VIT D DEFICIENCY, FRACTURES): VITD: 34.66 ng/mL (ref 30.00–100.00)

## 2023-03-05 LAB — HEMOGLOBIN A1C: Hgb A1c MFr Bld: 5.9 % (ref 4.6–6.5)

## 2023-03-05 LAB — VITAMIN B12: Vitamin B-12: 687 pg/mL (ref 211–911)

## 2023-03-05 LAB — MICROALBUMIN / CREATININE URINE RATIO
Creatinine,U: 20.1 mg/dL
Microalb Creat Ratio: 3.5 mg/g (ref 0.0–30.0)
Microalb, Ur: <0.7 mg/dL (ref 0.0–1.9)

## 2023-03-05 LAB — TSH: TSH: 2.44 u[IU]/mL (ref 0.35–5.50)

## 2023-03-06 LAB — HEPATITIS B SURFACE ANTIBODY, QUANTITATIVE: Hep B S AB Quant (Post): 26 m[IU]/mL (ref >=10)

## 2023-03-08 ENCOUNTER — Ambulatory Visit (INDEPENDENT_AMBULATORY_CARE_PROVIDER_SITE_OTHER): Payer: BC Managed Care – PPO | Admitting: Family Medicine

## 2023-03-08 ENCOUNTER — Encounter (INDEPENDENT_AMBULATORY_CARE_PROVIDER_SITE_OTHER): Payer: Self-pay | Admitting: Family Medicine

## 2023-03-08 VITALS — BP 118/77 | HR 66 | Temp 97.9°F | Ht 65.0 in | Wt 185.0 lb

## 2023-03-08 DIAGNOSIS — E1169 Type 2 diabetes mellitus with other specified complication: Secondary | ICD-10-CM

## 2023-03-08 DIAGNOSIS — I10 Essential (primary) hypertension: Secondary | ICD-10-CM

## 2023-03-08 DIAGNOSIS — E669 Obesity, unspecified: Secondary | ICD-10-CM

## 2023-03-08 DIAGNOSIS — E66811 Obesity, class 1: Secondary | ICD-10-CM

## 2023-03-08 DIAGNOSIS — E559 Vitamin D deficiency, unspecified: Secondary | ICD-10-CM

## 2023-03-08 DIAGNOSIS — Z7985 Long-term (current) use of injectable non-insulin antidiabetic drugs: Secondary | ICD-10-CM

## 2023-03-08 DIAGNOSIS — Z6830 Body mass index (BMI) 30.0-30.9, adult: Secondary | ICD-10-CM

## 2023-03-08 DIAGNOSIS — E785 Hyperlipidemia, unspecified: Secondary | ICD-10-CM

## 2023-03-08 MED ORDER — OZEMPIC (2 MG/DOSE) 8 MG/3ML ~~LOC~~ SOPN: 2.0000 mg | PEN_INJECTOR | SUBCUTANEOUS | 0 refills | Status: DC

## 2023-03-08 MED ORDER — VITAMIN D3 50 MCG (2000 UT) PO CAPS: ORAL_CAPSULE | ORAL | Status: DC

## 2023-03-08 NOTE — Progress Notes (Addendum)
Subjective:    Patient ID: Patty Williams, female    DOB: 1960-11-21, 62 y.o.   MRN: [id number]  HPI  Here for health maintenance exam and to review chronic medical problems   Wt Readings from Last 3 Encounters:  03/09/23 189 lb 2 oz (85.8 kg)  03/08/23 185 lb (83.9 kg)  02/26/23 193 lb (87.5 kg)   31.72 kg/m  Vitals:   03/09/23 1455  BP: 124/78  Pulse: 67  Temp: 98.5 F (36.9 C)  SpO2: 97%    Immunization History  Administered Date(s) Administered   Influenza Split 01/10/2012, 01/23/2013   Influenza Whole 03/02/2008, 02/21/2010   Influenza,inj,Quad PF,6+ Mos 01/13/2019, 01/19/2020   Influenza,trivalent, recombinat, inj, PF 02/04/2023   Influenza-Unspecified 01/23/2014, 01/24/2015, 01/30/2021, 01/30/2022   Moderna Covid-19 Fall Seasonal Vaccine 18yrs & older 03/10/2022, 02/04/2023   Moderna Sars-Covid-2 Vaccination 05/11/2019, 06/09/2019, 03/08/2020, 02/03/2021   Pneumococcal Polysaccharide-23 03/02/2008, 05/11/2011, 12/17/2017   Td 06/11/1997   Tdap 05/11/2011, 07/21/2018   Zoster Recombinant(Shingrix) 01/03/2018, 03/30/2018    Health Maintenance Due  Topic Date Due   OPHTHALMOLOGY EXAM  11/08/2009   FOOT EXAM  03/04/2023    Eye exam- was in sept/ sent for report   Flu shot -had this season   Mammogram 03/2022   , order is in for next one , has it scheduled for dec 10th  Self breast exam-no lumps   Gyn health Pap 09/2021  normal at gyn Has history of endometriosis   Colon cancer screening  colonoscopy 02/2019 with 10 y recall   Bone health  Dexa 2017  normal  Falls: none  Fractures: none  Supplements  Last vitamin D Lab Results  Component Value Date   VD25OH 34.66 03/05/2023  Was taking 2000- now on 4000 international units daily   Exercise  Just started using an E bike - pedals most of the time  Back is a limiting factor but doing a bit better  (dentistry really hurt her)  Some pilates and PT , massage, chiropractic care and nsaids for  back   Lots of protein   Mood    03/09/2023    3:16 PM 12/05/2021    8:09 AM 06/28/2020    1:29 PM 06/27/2020    7:57 AM 01/19/2020    4:16 PM  Depression screen PHQ 2/9  Decreased Interest 0 0 0 1 1  Down, Depressed, Hopeless 0 0 0 2 2  PHQ - 2 Score 0 0 0 3 3  Altered sleeping 1   2 1   Tired, decreased energy 1   2 0  Change in appetite 0   3 1  Feeling bad or failure about yourself  0   1 3  Trouble concentrating 0   3 1  Moving slowly or fidgety/restless 0   0 0  Suicidal thoughts 0   0 1  PHQ-9 Score 2   14 10   Difficult doing work/chores Not difficult at all   Somewhat difficult   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  Stress is going back    HTN bp is stable today  No cp or palpitations or headaches or edema  No side effects to medicines  BP Readings from Last 3 Encounters:  03/09/23 124/78  03/08/23 118/77  02/26/23 118/68    Benicar 40 mg daily   OSA- has cpap and doing well   Dm2 Lab Results  Component Value Date   HGBA1C 5.9 03/05/2023  Well controlled  Semaglutide  2 mg weekly   Lab Results  Component Value Date   MICROALBUR <0.7 03/05/2023   MICROALBUR 1.0 08/16/2009    Hyperlipidemia Lab Results  Component Value Date   CHOL 201 (H) 03/05/2023   CHOL 161 07/02/2022   CHOL 143 04/14/2022   Lab Results  Component Value Date   HDL 55.70 03/05/2023   HDL 49 07/02/2022   HDL 49.80 04/14/2022   Lab Results  Component Value Date   LDLCALC 127 (H) 03/05/2023   LDLCALC 99 07/02/2022   LDLCALC 78 04/14/2022   Lab Results  Component Value Date   TRIG 92.0 03/05/2023   TRIG 66 07/02/2022   TRIG 76.0 04/14/2022   Lab Results  Component Value Date   CHOLHDL 4 03/05/2023   CHOLHDL 3 04/14/2022   CHOLHDL 4 02/06/2022   Lab Results  Component Value Date   LDLDIRECT 144.2 09/21/2008  Previously took crestor 5 mg for now due to muscle pain and elevated cpk   Is increasing her fluids   Interested in coronary calcium score test     GERD Protonix 40 mg bid on bad days (now most days just daily) Famotidine at bedtime Watching diet  Lab Results  Component Value Date   VITAMINB12 687 03/05/2023  Wants to be able to decrease this more in future    Exercising more  Had recent positive sed rate with sport med Was ref to rheumatology for arthralgias with myalgias  Lab Results  Component Value Date   ANA POSITIVE (A) 02/16/2023   RF <10 02/16/2023   Lab Results  Component Value Date   ESRSEDRATE 32 (H) 02/16/2023     Lab Results  Component Value Date   CKTOTAL 247 (H) 02/11/2023  Holding off on crestor for now    Lab Results  Component Value Date   WBC 6.3 03/05/2023   HGB 13.0 03/05/2023   HCT 40.3 03/05/2023   MCV 93.3 03/05/2023   PLT 238.0 03/05/2023   Lab Results  Component Value Date   TSH 2.44 03/05/2023   Lab Results  Component Value Date   ALT 23 03/05/2023   AST 20 03/05/2023   ALKPHOS 76 03/05/2023   BILITOT 0.6 03/05/2023      Patient Active Problem List   Diagnosis Date Noted   Chronic back pain 03/16/2023   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 02/25/2023   Leg cramping 02/11/2023   Notalgia 08/11/2022   Hyperlipidemia associated with type 2 diabetes mellitus (HCC) 07/02/2022   Stress 06/16/2022   BMI 31.0-31.9,adult 06/16/2022   Obesity, Beginning BMI 38.11 06/16/2022   Type 2 diabetes mellitus with obesity (HCC) 05/25/2022   Heat intolerance 12/15/2021   Type 2 diabetes mellitus with other specified complication (HCC) 09/30/2021   Lumbar disc disease 02/11/2021   Other fatigue 06/27/2020   Vitamin D deficiency 06/27/2020   At risk for heart disease 06/27/2020   Intermittent diarrhea 03/15/2020   Hx of adenomatous polyp of colon 03/24/2019   Family history of pulmonary fibrosis 12/23/2018   Family history of kidney disease 12/23/2018   Colon cancer screening 12/23/2018   Current use of proton pump inhibitor 10/18/2015   History of  fracture 10/18/2015   Left knee pain 04/19/2015   Frequent UTI 10/06/2013   Seasonal and perennial allergic rhinitis 01/01/2013   Routine general medical examination at a health care facility 04/30/2011   Diabetes mellitus treated with injections of non-insulin medication (HCC)    Plantar fasciitis  METATARSALGIA 04/11/2010   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   Obstructive sleep apnea 06/10/2009   GERD 11/09/2008   Essential hypertension 03/02/2008   Class 1 obesity due to excess calories with serious comorbidity and body mass index (BMI) of 33.0 to 33.9 in adult 11/18/2007   Allergic asthma, mild intermittent, uncomplicated 03/29/2007   Endometriosis 03/29/2007   Past Medical History:  Diagnosis Date   Anxiety    Asthma    Back pain    Bilateral ovarian cysts    Bruxism    Constipation    Diabetes mellitus type II    mild   Diverticulosis 2014   Mild   Dry mouth    Elevated blood pressure reading without diagnosis of hypertension    Endometriosis    Severe   Fibroids    GERD (gastroesophageal reflux disease)    History of ovarian cyst    HLD (hyperlipidemia)    Hx of adenomatous polyp of colon 03/24/2019   Hyperglycemia    Hyperlipidemia    Hypertension    Iron deficiency anemia    Joint pain    Menorrhagia    Obesity    Obesity    OSA on CPAP    Plantar fasciitis    with orthotics-much difficulty adjusting these   PMB (postmenopausal bleeding)    Pneumonia    history of   PONV (postoperative nausea and vomiting)    Pre-diabetes    Rectovaginal fistula    Sleep apnea    Uterine adhesion    Uterine fibroid    Wears glasses    Past Surgical History:  Procedure Laterality Date   COLONOSCOPY  01/2009   DILATATION & CURETTAGE/HYSTEROSCOPY WITH MYOSURE N/A 09/22/2018   Procedure: DILATATION & CURETTAGE/HYSTEROSCOPY;  Surgeon: Noland Fordyce, MD;  Location: [facility] ;  Service: Gynecology;  Laterality: N/A;   HEMORRHOID BANDING      HEMORRHOID SURGERY     LAPAROSCOPY     endometriosis   NASAL SEPTOPLASTY W/ TURBINOPLASTY Bilateral 09/02/2021   Procedure: NASAL SEPTOPLASTY WITH SUBMUCOSAL RESECTION OF TURBINATES;  Surgeon: Linus Salmons, MD;  Location: [facility];  Service: ENT;  Laterality: Bilateral;   SEPTOPLASTY     april 2023   TONSILLECTOMY AND ADENOIDECTOMY     TRIGGER FINGER RELEASE     UPPER GI ENDOSCOPY  01/2009   uterine biopsy     uterine cyst exploration     UTERINE SUSPENSION     WISDOM TOOTH EXTRACTION     Social History   Tobacco Use   Smoking status: Never   Smokeless tobacco: Never  Vaping Use   Vaping status: Never Used  Substance Use Topics   Alcohol use: Not Currently    Comment: occasionally   Drug use: No   Family History  Problem Relation Age of Onset   Thyroid disease Mother        ?   Diabetes Mother    Hypertension Mother    Hyperlipidemia Mother    Anxiety disorder Mother    Sleep apnea Mother    Obesity Mother    Heart disease Mother    Cancer Father        prostate   Hypertension Father    Obesity Father    Prostate cancer Father    Heart attack Other        grandfather (in his 40's)   Parkinsonism Other        grandfater   Stomach cancer Neg Hx    Colon  cancer Neg Hx    Esophageal cancer Neg Hx    No Known Allergies Current Outpatient Medications on File Prior to Visit  Medication Sig Dispense Refill   albuterol (VENTOLIN HFA) 108 (90 Base) MCG/ACT inhaler Inhale 2 puffs into the lungs every 4 (four) hours as needed. 8.5 g 12   AMBULATORY NON FORMULARY MEDICATION Single glucometer with lancets, test strips-Check FBS and 2 hour PP(BID) 1 each 0   budesonide-formoterol (SYMBICORT) 80-4.5 MCG/ACT inhaler USE 2 INHALATIONS TWICE A DAY 30.6 g 4   Cholecalciferol (VITAMIN D3) 50 MCG (2000 UT) capsule 2 po every day ( 4 - 5000 IU every day)     Coenzyme Q10 (COQ10) 100 MG CAPS Take 1 capsule by mouth daily.     docusate sodium (COLACE) 100 MG capsule Take 100 mg  by mouth daily as needed for mild constipation.     MAGNESIUM GLYCINATE PO Take 240 mg by mouth daily.     Multiple Vitamin (MULTIVITAMIN) tablet Take 1 tablet by mouth daily.     naproxen (NAPROSYN) 500 MG tablet Take 1 tablet (500 mg total) by mouth 2 (two) times daily as needed for moderate pain. With a meal 60 tablet 3   olmesartan (BENICAR) 40 MG tablet Take 1 tablet (40 mg total) by mouth daily. 90 tablet 2   OVER THE COUNTER MEDICATION Pepcid, Take one at bedtime daily     pantoprazole (PROTONIX) 40 MG tablet Take 1 tablet (40 mg total) by mouth 2 (two) times daily before a meal. Breakfast and supper 60 tablet 11   Semaglutide, 2 MG/DOSE, (OZEMPIC, 2 MG/DOSE,) 8 MG/3ML SOPN Inject 2 mg into the skin once a week. 3 mL 0   rosuvastatin (CRESTOR) 5 MG tablet Take 1 tablet (5 mg total) by mouth daily. (Patient not taking: Reported on 03/08/2023) 90 tablet 1   No current facility-administered medications on file prior to visit.    Review of Systems  Constitutional:  Positive for fatigue. Negative for activity change, appetite change, fever and unexpected weight change.  HENT:  Negative for congestion, ear pain, rhinorrhea, sinus pressure and sore throat.   Eyes:  Negative for pain, redness and visual disturbance.  Respiratory:  Negative for cough, shortness of breath and wheezing.   Cardiovascular:  Negative for chest pain and palpitations.  Gastrointestinal:  Negative for abdominal pain, blood in stool, constipation and diarrhea.  Endocrine: Negative for polydipsia and polyuria.  Genitourinary:  Negative for dysuria, frequency and urgency.  Musculoskeletal:  Positive for arthralgias and myalgias. Negative for back pain.       Low back pain is 3/10 in am- takes 1-2 hours to move and get 90% of ROM back for regular activity  Her baseline is 2/10 pain    Skin:  Negative for pallor and rash.  Allergic/Immunologic: Negative for environmental allergies.  Neurological:  Negative for  dizziness, syncope and headaches.  Hematological:  Negative for adenopathy. Does not bruise/bleed easily.  Psychiatric/Behavioral:  Negative for decreased concentration and dysphoric mood. The patient is not nervous/anxious.        Objective:   Physical Exam Constitutional:      General: She is not in acute distress.    Appearance: Normal appearance. She is well-developed. She is obese. She is not ill-appearing or diaphoretic.  HENT:     Head: Normocephalic and atraumatic.     Right Ear: Tympanic membrane, ear canal and external ear normal.     Left Ear: Tympanic membrane, ear canal and  external ear normal.     Nose: Nose normal. No congestion.     Mouth/Throat:     Mouth: Mucous membranes are moist.     Pharynx: Oropharynx is clear. No posterior oropharyngeal erythema.  Eyes:     General: No scleral icterus.    Extraocular Movements: Extraocular movements intact.     Conjunctiva/sclera: Conjunctivae normal.     Pupils: Pupils are equal, round, and reactive to light.  Neck:     Thyroid: No thyromegaly.     Vascular: No carotid bruit or JVD.  Cardiovascular:     Rate and Rhythm: Normal rate and regular rhythm.     Pulses: Normal pulses.     Heart sounds: Normal heart sounds.     No gallop.  Pulmonary:     Effort: Pulmonary effort is normal. No respiratory distress.     Breath sounds: Normal breath sounds. No wheezing.     Comments: Good air exch Chest:     Chest wall: No tenderness.  Abdominal:     General: Bowel sounds are normal. There is no distension or abdominal bruit.     Palpations: Abdomen is soft. There is no mass.     Tenderness: There is no abdominal tenderness.     Hernia: No hernia is present.  Genitourinary:    Comments: Breast and pelvic exam are done by gyn provider    Musculoskeletal:        General: No tenderness. Normal range of motion.     Cervical back: Normal range of motion and neck supple. No rigidity. No muscular tenderness.     Right lower  leg: No edema.     Left lower leg: No edema.     Comments: No kyphosis   Lymphadenopathy:     Cervical: No cervical adenopathy.  Skin:    General: Skin is warm and dry.     Coloration: Skin is not pale.     Findings: No erythema or rash.     Comments: Solar lentigines diffusely   Neurological:     Mental Status: She is alert. Mental status is at baseline.     Cranial Nerves: No cranial nerve deficit.     Motor: No abnormal muscle tone.     Coordination: Coordination normal.     Gait: Gait normal.     Deep Tendon Reflexes: Reflexes are normal and symmetric. Reflexes normal.  Psychiatric:        Mood and Affect: Mood normal.        Cognition and Memory: Cognition and memory normal.           Assessment & Plan:   Problem List Items Addressed This Visit       Cardiovascular and Mediastinum   Essential hypertension    bp in fair control at this time  BP Readings from Last 1 Encounters:  03/09/23 124/78   No changes needed Plan to continue generic benicar 40 mg daily  Most recent labs reviewed  Disc lifstyle change with low sodium diet and exercise  Plans to keep working on wt loss and fitness  No restrictions for hot air balloon activities         Respiratory   Obstructive sleep apnea    Cpap is helpful        Digestive   GERD    Continues protonix Does not always need it bid  Watching diet  Hope this will continue to also improve with weight loss  Endocrine   Diabetes mellitus treated with injections of non-insulin medication (HCC)    Lab Results  Component Value Date   HGBA1C 5.9 03/05/2023   Continues semaglutide 1-2 mg weekly from weight clinic  Encouraged low glycemic diet Working on self care and exercise  Microalb utd  Very good foot care  Off statin (may be temporary) for possible myopathy /elevated cpk  On arb       Hyperlipidemia associated with type 2 diabetes mellitus (HCC)    Disc goals for lipids and reasons to control  them Rev last labs with pt Rev low sat fat diet in detail LDL is up to 127 - currently off statin for elevated cpk (mild) and possible myopathy  Planning cardiac calcium score test   Planning rheum visit as well      Relevant Orders   CT CARDIAC SCORING (SELF PAY ONLY)     Other   BMI 31.0-31.9,adult    [facility]ues to [facility]       Chronic back pain    Both low back and mid back   Low back pain is 3/10 in am- takes 1-2 hours to move and get 90% of ROM back for regular activity  Her baseline is 2/10 pain   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 between shoulder blades after 15/20 mintues   Plan to continue PT, pilates, masssage, chiropractic care , nsaid and sport med care In process of getting imaging       Current use of proton pump inhibitor    Lab Results  Component Value Date   VITAMINB12 687 03/05/2023   Last vitamin D Lab Results  Component Value Date   VD25OH 34.66 03/05/2023          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    Positive immunity       Routine general medical examination at a health care facility - Primary    Reviewed health habits including diet and exercise and skin cancer prevention Reviewed appropriate screening tests for age  Also reviewed health mt list, fam hx and immunization status , as well as social and family history   See HPI Labs reviewed and ordered Self care is improving Sent for recent eye exam report  Mammogram scheduled for 04/20/23 Colonoscopy utd 02/2019  Dexa normal 2017  Discussed fall prevention, supplements and exercise for bone density  PHQ 0        Vitamin D deficiency    Last vitamin D Lab Results  Component Value Date   VD25OH 34.66 03/05/2023   Recently increase her D3 to 4000 international units daily for bone and overall health

## 2023-03-08 NOTE — Progress Notes (Signed)
Carlye Grippe, D.O.  ABFM, ABOM Specializing in Clinical Bariatric Medicine  Office located at: [address] [place], [place]  [address]     Assessment and Plan:  Pt requested we review labs done by PCP today.  Medications Discontinued During This Encounter  Medication Reason   Semaglutide, 2 MG/DOSE, (OZEMPIC, 2 MG/DOSE,) 8 MG/3ML SOPN Reorder    Meds ordered this encounter  Medications   Semaglutide, 2 MG/DOSE, (OZEMPIC, 2 MG/DOSE,) 8 MG/3ML SOPN    Sig: Inject 2 mg into the skin once a week.    Dispense:  3 mL    Refill:  0   Cholecalciferol (VITAMIN D3) 50 MCG (2000 UT) capsule    Sig: 2 po every day ( 4 - 5000 IU every day)   Hyperlipidemia associated with type 2 diabetes mellitus (HCC) Assessment: Condition is Not optimized.. Pt LDL increased from 78 to 127 but her HDL also increased from 49.80 to 55.70. Pt informed me that she discontinued Crestor due to leg cramps on 02/22/2023. She continues Co Q 10 without difficulty. Pt has a maternal grandfather that passed at the age of 33 due to acute MI.  Lab Results  Component Value Date   CHOL 201 (H) 03/05/2023   HDL 55.70 03/05/2023   LDLCALC 127 (H) 03/05/2023   LDLDIRECT 144.2 09/21/2008   TRIG 92.0 03/05/2023   CHOLHDL 4 03/05/2023   Plan: - If she wishes to resume Crestor she is to make sure she is hydrated first and then start every other day.   - Pt is to discuss with PCP about the option of having a coronary calcium score being done.   - I stressed the importance that patient continue with our prudent nutritional plan that is low in saturated and trans fats, and low in fatty carbs to improve these numbers.   - We recommend: aerobic activity with eventual goal of a minimum of 150+ min wk plus 2 days/ week of resistance or strength training.    - We will continue routine screening as patient continues to achieve health goals along their weight loss journey   Labs were reviewed with patient today and  education provided on them. We discussed how the foods patient eats may influence these laboratory findings.  All of the patient's questions about them were answered    Vitamin D deficiency Assessment: Condition is Not at goal.. Pt vitamin D levels have dropped from 63.3 to 34.66 as of 03/05/2023. She continues with a multi-vitamin and OTC vitamin D supplement 2,000 once daily. Her B-12 levels is within optimal range.  Lab Results  Component Value Date   VD25OH 34.66 03/05/2023   VD25OH 63.3 11/02/2022   VD25OH 73.9 07/02/2022   Lab Results  Component Value Date   VITAMINB12 687 03/05/2023   Plan:- Begin taking her OTC vitamin D supplement from once daily to BID. Continue multivitamin as directed. I will refill.   - I reviewed possible symptoms of low Vitamin D: muscle aches, joint aches, osteoporosis etc. with patient.  - weight loss will likely improve availability of vitamin D, thus encouraged Briannie to continue with meal plan and their weight loss efforts to further improve this condition.  Thus, we will need to monitor levels regularly (every 3-4 mo on average) to keep levels within normal limits and prevent over supplementation.  Labs were reviewed with patient today and education provided on them. We discussed how the foods patient eats may influence these laboratory findings.  All  of the patient's questions about them were answered    Essential hypertension Assessment: Condition is Controlled.. This is being treated with Benicar once daily and she tolerates this well. She has been complaint with regularly taking this. Pt is asymptomatic.  Last 3 blood pressure readings in our office are as follows: BP Readings from Last 3 Encounters:  03/08/23 118/77  02/26/23 118/68  02/16/23 130/88   Plan: - Continue with Benicar 40mg  daily.   - Salisha H Appling BP is 118/77 at goal today.   - Counseled Adrienne Mocha on pathophysiology of disease and discussed treatment plan, which always  includes dietary and lifestyle modification as first line.   - We will continue to monitor closely alongside PCP/ specialists.  Pt reminded to also f/up with those individuals as instructed by them.    Type 2 diabetes mellitus with other specified complication, unspecified whether long term insulin use (HCC) Assessment: Condition is Not at goal.. Pt A1c has increased from 5.7 to 5.9 as of 03/05/2023. Her CBC, CMP, thyroid levels are all within normal levels. Pt continues Ozempic without difficulty. She denies any GI upset, N/V/D. She endorses her hunger and cravings being mainly controlled.  Lab Results  Component Value Date   HGBA1C 5.9 03/05/2023   HGBA1C 5.7 (H) 11/02/2022   HGBA1C 5.7 (H) 07/08/2022   INSULIN 10.3 07/02/2022   INSULIN 19.5 12/03/2021   INSULIN 13.2 07/08/2021   Lab Results  Component Value Date   WBC 6.3 03/05/2023   HGB 13.0 03/05/2023   HCT 40.3 03/05/2023   MCV 93.3 03/05/2023   PLT 238.0 03/05/2023   Lab Results  Component Value Date   CREATININE 0.64 03/05/2023   BUN 19 03/05/2023   NA 144 03/05/2023   K 4.4 03/05/2023   CL 104 03/05/2023   CO2 32 03/05/2023      Component Value Date/Time   PROT 6.9 03/05/2023 0824   PROT 6.8 02/11/2023 1555   ALBUMIN 4.2 03/05/2023 0824   ALBUMIN 4.2 02/11/2023 1555   AST 20 03/05/2023 0824   ALT 23 03/05/2023 0824   ALKPHOS 76 03/05/2023 0824   BILITOT 0.6 03/05/2023 0824   BILITOT 0.3 02/11/2023 1555   BILIDIR 0.1 03/02/2008 1018   Lab Results  Component Value Date   TSH 2.44 03/05/2023   T3TOTAL 87 12/15/2021   T4TOTAL 6.6 06/27/2020   Plan: - Continue with Ozempic 2mg  as directed.   I will refill.   - Drink at least 50oz of water daily and cut back on her caffeine intake.   - Reminded Adrienne Mocha if she feels poorly- check Blood Sugar and Blood Pressure at that time.     - Recommend that any concerns about medicines should be directed at the prescribing provider  Labs were reviewed with  patient today and education provided on them. We discussed how the foods patient eats may influence these laboratory findings.  All of the patient's questions about them were answered    TREATMENT PLAN FOR OBESITY: Obesity (BMI 30.0-34.9) -current BM 30.79 Obesity, Beginning BMI 38.11 Assessment:  Adrienne Mocha is here to discuss her progress with her obesity treatment plan along with follow-up of her obesity related diagnoses. See Medical Weight Management Flowsheet for complete bioelectrical impedance results.  Condition is improving but not optimized. Biometric data collected today, was reviewed with patient.   Since last office visit on 02/11/2023 patient's  Muscle mass is the same. Fat mass has decreased by 3.4lb. Total body  water has decreased by 4lb.  Counseling done on how various foods will affect these numbers and how to maximize success  Total lbs lost to date: 44 Total weight loss percentage to date: 19.21%  Plan: - Guida will continue to follow the Category 2 meal plan.   - I recommended she take a sip of water between each bite of a meal.   Behavioral Intervention Additional resources provided today:  Levels of support handout Evidence-based interventions for health behavior change were utilized today including the discussion of self monitoring techniques, problem-solving barriers and SMART goal setting techniques.   Regarding patient's less desirable eating habits and patterns, we employed the technique of small changes.  Pt will specifically work on: Drink 3-4 bottles or 50oz of water daily for next visit.     She has agreed to Continue current level of physical activity    FOLLOW UP: Return in about 17 days (around 03/25/2023).  She was informed of the importance of frequent follow up visits to maximize her success with intensive lifestyle modifications for her multiple health conditions.  Subjective:   Chief complaint: Obesity Kery is here to discuss her  progress with her obesity treatment plan. She is on the the Category 2 Plan and states she is following her eating plan approximately 60% of the time. She states she is walking 20 minutes 4 days per week, going to physical therapy 1 day a week and riding her E-bike.   Interval History:  Adrienne Mocha is here for a follow up office visit.     Since last office visit:  She is excited to have more free time and less stress soon. 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 She has recently been using her E-bike and loves using this. She notes trying to eat on plan and that her wife brings unhealthy food into the household. She notes drinking an average of only 16oz of water.    We reviewed her meal plan and all questions were answered.   Pharmacotherapy for weight loss: She is currently taking  Ozempic  for medical weight loss.  Denies side effects.    Review of Systems:  Pertinent positives were addressed with patient today.  Reviewed by clinician on day of visit: allergies, medications, problem list, medical history, surgical history, family history, social history, and previous encounter notes.  Weight Summary and Biometrics   Weight Lost Since Last Visit: 4lb  Weight Gained Since Last Visit: 0lb   Vitals Temp: 97.9 F (36.6 C) BP: 118/77 Pulse Rate: 66 SpO2: 98 %   Anthropometric Measurements Height: 5\' 5"  (1.651 m) Weight: 185 lb (83.9 kg) BMI (Calculated): 30.79 Weight at Last Visit: 189lb Weight Lost Since Last Visit: 4lb Weight Gained Since Last Visit: 0lb Starting Weight: 229lb Total Weight Loss (lbs): 44 lb (20 kg)   Body Composition  Body Fat %: 40.9 % Fat Mass (lbs): 76 lbs Muscle Mass (lbs): 104.2 lbs Total Body Water (lbs): 73 lbs Visceral Fat Rating : 11   Other Clinical Data Fasting: no Labs: no Today's Visit #: 35 Starting Date: 06/27/20     Objective:   PHYSICAL EXAM: Blood pressure  118/77, pulse 66, temperature 97.9 F (36.6 C), height 5\' 5"  (1.651 m), weight 185 lb (83.9 kg), last menstrual period 10/08/2018, SpO2 98%. Body mass index is 30.79 kg/m.  General: Well Developed, well nourished, and in no acute distress.  HEENT: Normocephalic, atraumatic Skin:  Warm and dry, cap RF less 2 sec, good turgor Chest:  Normal excursion, shape, no gross abn Respiratory: speaking in full sentences, no conversational dyspnea NeuroM-Sk: Ambulates w/o assistance, moves * 4 Psych: A and O *3, insight good, mood-full  DIAGNOSTIC DATA REVIEWED:  BMET    Component Value Date/Time   NA 144 03/05/2023 0824   NA 142 02/11/2023 1555   K 4.4 03/05/2023 0824   CL 104 03/05/2023 0824   CO2 32 03/05/2023 0824   GLUCOSE 98 03/05/2023 0824   BUN 19 03/05/2023 0824   BUN 22 02/11/2023 1555   CREATININE 0.64 03/05/2023 0824   CALCIUM 9.4 03/05/2023 0824   GFRNONAA 98 06/27/2020 0840   GFRAA 113 06/27/2020 0840   Lab Results  Component Value Date   HGBA1C 5.9 03/05/2023   HGBA1C 6.5 (H) 03/02/2008   Lab Results  Component Value Date   INSULIN 10.3 07/02/2022   INSULIN 19.9 06/27/2020   Lab Results  Component Value Date   TSH 2.44 03/05/2023   CBC    Component Value Date/Time   WBC 6.3 03/05/2023 0824   RBC 4.33 03/05/2023 0824   HGB 13.0 03/05/2023 0824   HGB 13.0 06/27/2020 0840   HCT 40.3 03/05/2023 0824   HCT 39.0 06/27/2020 0840   PLT 238.0 03/05/2023 0824   PLT 268 06/27/2020 0840   MCV 93.3 03/05/2023 0824   MCV 87 06/27/2020 0840   MCH 28.9 06/27/2020 0840   MCH 28.9 09/23/2018 0404   MCHC 32.1 03/05/2023 0824   RDW 14.2 03/05/2023 0824   RDW 14.3 06/27/2020 0840   Iron Studies    Component Value Date/Time   IRON 62 11/09/2008 1037   FERRITIN 3.9 (L) 11/09/2008 1037   IRONPCTSAT 11.3 (L) 11/09/2008 1037   Lipid Panel     Component Value Date/Time   CHOL 201 (H) 03/05/2023 0824   CHOL 161 07/02/2022 1058   TRIG 92.0 03/05/2023 0824   HDL 55.70  03/05/2023 0824   HDL 49 07/02/2022 1058   CHOLHDL 4 03/05/2023 0824   VLDL 18.4 03/05/2023 0824   LDLCALC 127 (H) 03/05/2023 0824   LDLCALC 99 07/02/2022 1058   LDLDIRECT 144.2 09/21/2008 1008   Hepatic Function Panel     Component Value Date/Time   PROT 6.9 03/05/2023 0824   PROT 6.8 02/11/2023 1555   ALBUMIN 4.2 03/05/2023 0824   ALBUMIN 4.2 02/11/2023 1555   AST 20 03/05/2023 0824   ALT 23 03/05/2023 0824   ALKPHOS 76 03/05/2023 0824   BILITOT 0.6 03/05/2023 0824   BILITOT 0.3 02/11/2023 1555   BILIDIR 0.1 03/02/2008 1018      Component Value Date/Time   TSH 2.44 03/05/2023 0824   Nutritional Lab Results  Component Value Date   VD25OH 34.66 03/05/2023   VD25OH 63.3 11/02/2022   VD25OH 73.9 07/02/2022    Attestations:   This encounter took 43 total minutes of time including any pre-visit and post-visit time spent on this date of service, including taking a thorough history, reviewing any labs and/or imaging, reviewing prior notes, as well as documenting in the electronic health record on the date of service. Over 50% of that time was in direct face-to-face counseling and coordinating care for the patient today  I, Clinical biochemist, acting as a Stage manager for [employer], DO., have compiled all relevant documentation for today's office visit on behalf of Thomasene Lot, DO, while in the presence of [employer], DO.  I have reviewed the above  documentation for accuracy and completeness, and I agree with the above. Carlye Grippe, D.O.  The 21st Century Cures Act was signed into law in 2016 which includes the topic of electronic health records.  This provides immediate access to information in MyChart.  This includes consultation notes, operative notes, office notes, lab results and pathology reports.  If you have any questions about what you read please let us know at your next visit so we can discuss your concerns and take corrective action if need be.  We  are right here with you.

## 2023-03-09 ENCOUNTER — Ambulatory Visit (INDEPENDENT_AMBULATORY_CARE_PROVIDER_SITE_OTHER): Payer: BC Managed Care – PPO | Admitting: Family Medicine

## 2023-03-09 ENCOUNTER — Encounter: Payer: Self-pay | Admitting: Family Medicine

## 2023-03-09 VITALS — BP 124/78 | HR 67 | Temp 98.5°F | Ht 64.75 in | Wt 189.1 lb

## 2023-03-09 DIAGNOSIS — Z0184 Encounter for antibody response examination: Secondary | ICD-10-CM

## 2023-03-09 DIAGNOSIS — E785 Hyperlipidemia, unspecified: Secondary | ICD-10-CM

## 2023-03-09 DIAGNOSIS — G4733 Obstructive sleep apnea (adult) (pediatric): Secondary | ICD-10-CM | POA: Diagnosis not present

## 2023-03-09 DIAGNOSIS — Z7985 Long-term (current) use of injectable non-insulin antidiabetic drugs: Secondary | ICD-10-CM

## 2023-03-09 DIAGNOSIS — K219 Gastro-esophageal reflux disease without esophagitis: Secondary | ICD-10-CM

## 2023-03-09 DIAGNOSIS — Z6831 Body mass index (BMI) 31.0-31.9, adult: Secondary | ICD-10-CM

## 2023-03-09 DIAGNOSIS — M549 Dorsalgia, unspecified: Secondary | ICD-10-CM

## 2023-03-09 DIAGNOSIS — Z79899 Other long term (current) drug therapy: Secondary | ICD-10-CM

## 2023-03-09 DIAGNOSIS — Z0000 Encounter for general adult medical examination without abnormal findings: Secondary | ICD-10-CM | POA: Diagnosis not present

## 2023-03-09 DIAGNOSIS — I10 Essential (primary) hypertension: Secondary | ICD-10-CM

## 2023-03-09 DIAGNOSIS — E119 Type 2 diabetes mellitus without complications: Secondary | ICD-10-CM

## 2023-03-09 DIAGNOSIS — E1169 Type 2 diabetes mellitus with other specified complication: Secondary | ICD-10-CM

## 2023-03-09 DIAGNOSIS — E559 Vitamin D deficiency, unspecified: Secondary | ICD-10-CM

## 2023-03-09 DIAGNOSIS — G8929 Other chronic pain: Secondary | ICD-10-CM

## 2023-03-09 NOTE — Assessment & Plan Note (Signed)
Positive immunity

## 2023-03-09 NOTE — Assessment & Plan Note (Signed)
[facility]

## 2023-03-09 NOTE — Assessment & Plan Note (Signed)
Reviewed health habits including diet and exercise and skin cancer prevention Reviewed appropriate screening tests for age  Also reviewed health mt list, fam hx and immunization status , as well as social and family history   See HPI Labs reviewed and ordered Self care is improving Sent for recent eye exam report  Mammogram scheduled for 04/20/23 Colonoscopy utd 02/2019  Dexa normal 2017  Discussed fall prevention, supplements and exercise for bone density  PHQ 0

## 2023-03-09 NOTE — Assessment & Plan Note (Signed)
Cpap is helpful

## 2023-03-09 NOTE — Patient Instructions (Addendum)
I put the referral in for coronary calcium score  Please let us know if you don't hear in 1-2 weeks   When you determine what you can do safely  Add some strength training to your routine, this is important for bone and brain health and can reduce your risk of falls and help your body use insulin properly and regulate weight  Light weights, exercise bands , and internet videos are a good way to start  Yoga (chair or regular), machines , floor exercises or a gym with machines are also good options   Take care of yourself

## 2023-03-09 NOTE — Assessment & Plan Note (Signed)
Continues protonix Does not always need it bid  Watching diet  Hope this will continue to also improve with weight loss

## 2023-03-09 NOTE — Assessment & Plan Note (Signed)
Lab Results  Component Value Date   VITAMINB12 687 03/05/2023   Last vitamin D Lab Results  Component Value Date   VD25OH 34.66 03/05/2023

## 2023-03-09 NOTE — Assessment & Plan Note (Signed)
Last vitamin D Lab Results  Component Value Date   VD25OH 34.66 03/05/2023   Recently increase her D3 to 4000 international units daily for bone and overall health

## 2023-03-09 NOTE — Assessment & Plan Note (Signed)
bp in fair control at this time  BP Readings from Last 1 Encounters:  03/09/23 124/78   No changes needed Plan to continue generic benicar 40 mg daily  Most recent labs reviewed  Disc lifstyle change with low sodium diet and exercise  Plans to keep working on wt loss and fitness  No restrictions for hot air balloon activities

## 2023-03-09 NOTE — Assessment & Plan Note (Signed)
Lab Results  Component Value Date   HGBA1C 5.9 03/05/2023   Continues semaglutide 1-2 mg weekly from weight clinic  Encouraged low glycemic diet Working on self care and exercise  Microalb utd  Very good foot care  Off statin (may be temporary) for possible myopathy /elevated cpk  On arb

## 2023-03-09 NOTE — Assessment & Plan Note (Signed)
Disc goals for lipids and reasons to control them Rev last labs with pt Rev low sat fat diet in detail LDL is up to 127 - currently off statin for elevated cpk (mild) and possible myopathy  Planning cardiac calcium score test   Planning rheum visit as well

## 2023-03-12 DIAGNOSIS — G4733 Obstructive sleep apnea (adult) (pediatric): Secondary | ICD-10-CM | POA: Diagnosis not present

## 2023-03-14 ENCOUNTER — Encounter: Payer: Self-pay | Admitting: Family Medicine

## 2023-03-16 DIAGNOSIS — M359 Systemic involvement of connective tissue, unspecified: Secondary | ICD-10-CM | POA: Diagnosis not present

## 2023-03-16 DIAGNOSIS — G8929 Other chronic pain: Secondary | ICD-10-CM | POA: Insufficient documentation

## 2023-03-16 DIAGNOSIS — M791 Myalgia, unspecified site: Secondary | ICD-10-CM | POA: Diagnosis not present

## 2023-03-16 DIAGNOSIS — M2550 Pain in unspecified joint: Secondary | ICD-10-CM | POA: Diagnosis not present

## 2023-03-16 DIAGNOSIS — R768 Other specified abnormal immunological findings in serum: Secondary | ICD-10-CM | POA: Diagnosis not present

## 2023-03-16 NOTE — Assessment & Plan Note (Signed)
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

## 2023-03-18 ENCOUNTER — Ambulatory Visit
Admission: RE | Admit: 2023-03-18 | Discharge: 2023-03-18 | Disposition: A | Discharge status: Home / Self Care | Payer: BC Managed Care – PPO | Source: Ambulatory Visit | Attending: Family Medicine | Admitting: Family Medicine

## 2023-03-18 DIAGNOSIS — E1169 Type 2 diabetes mellitus with other specified complication: Secondary | ICD-10-CM | POA: Insufficient documentation

## 2023-03-18 DIAGNOSIS — E785 Hyperlipidemia, unspecified: Secondary | ICD-10-CM

## 2023-03-19 ENCOUNTER — Telehealth: Payer: Self-pay | Admitting: Family Medicine

## 2023-03-19 ENCOUNTER — Encounter: Payer: Self-pay | Admitting: Family Medicine

## 2023-03-19 NOTE — Telephone Encounter (Signed)
Patient called in returning a call she received.

## 2023-03-19 NOTE — Telephone Encounter (Signed)
Addressed through mychart

## 2023-03-21 ENCOUNTER — Encounter: Payer: Self-pay | Admitting: Family Medicine

## 2023-03-23 NOTE — Progress Notes (Signed)
Cervical spine x-ray shows arthritis and evidence of neck muscle spasm.

## 2023-03-23 NOTE — Progress Notes (Signed)
Thoracic spine x-ray shows a little bit of scoliosis with a little bit of arthritis.

## 2023-03-23 NOTE — Progress Notes (Signed)
X-ray of the sacrum and coccyx looks normal to radiology.

## 2023-03-23 NOTE — Progress Notes (Signed)
Lumbar spine x-ray shows some arthritis changes in the low back.

## 2023-03-25 ENCOUNTER — Encounter (INDEPENDENT_AMBULATORY_CARE_PROVIDER_SITE_OTHER): Payer: Self-pay | Admitting: Family Medicine

## 2023-03-25 ENCOUNTER — Ambulatory Visit (INDEPENDENT_AMBULATORY_CARE_PROVIDER_SITE_OTHER): Payer: BC Managed Care – PPO | Admitting: Family Medicine

## 2023-03-25 VITALS — BP 108/78 | HR 65 | Temp 98.1°F | Ht 65.0 in | Wt 187.0 lb

## 2023-03-25 DIAGNOSIS — I10 Essential (primary) hypertension: Secondary | ICD-10-CM | POA: Diagnosis not present

## 2023-03-25 DIAGNOSIS — R252 Cramp and spasm: Secondary | ICD-10-CM

## 2023-03-25 DIAGNOSIS — E669 Obesity, unspecified: Secondary | ICD-10-CM

## 2023-03-25 DIAGNOSIS — E1169 Type 2 diabetes mellitus with other specified complication: Secondary | ICD-10-CM

## 2023-03-25 DIAGNOSIS — M79661 Pain in right lower leg: Secondary | ICD-10-CM | POA: Insufficient documentation

## 2023-03-25 DIAGNOSIS — E785 Hyperlipidemia, unspecified: Secondary | ICD-10-CM | POA: Diagnosis not present

## 2023-03-25 DIAGNOSIS — E119 Type 2 diabetes mellitus without complications: Secondary | ICD-10-CM | POA: Diagnosis not present

## 2023-03-25 DIAGNOSIS — Z7985 Long-term (current) use of injectable non-insulin antidiabetic drugs: Secondary | ICD-10-CM

## 2023-03-25 DIAGNOSIS — E7849 Other hyperlipidemia: Secondary | ICD-10-CM

## 2023-03-25 DIAGNOSIS — Z6831 Body mass index (BMI) 31.0-31.9, adult: Secondary | ICD-10-CM

## 2023-03-25 MED ORDER — OZEMPIC (2 MG/DOSE) 8 MG/3ML ~~LOC~~ SOPN: 2.0000 mg | PEN_INJECTOR | SUBCUTANEOUS | 0 refills | Status: DC

## 2023-03-25 NOTE — Progress Notes (Signed)
.[facility]  Office: [phone]  /  Fax: [phone]  WEIGHT SUMMARY AND BIOMETRICS  Anthropometric Measurements Height: 5\' 5"  (1.651 m) Weight: 187 lb (84.8 kg) BMI (Calculated): 31.12 Weight at Last Visit: 185 lb Weight Lost Since Last Visit: 0 Weight Gained Since Last Visit: 2 lb Starting Weight: 229 lb Total Weight Loss (lbs): 42 lb (19.1 kg)   Body Composition  Body Fat %: 41.2 % Fat Mass (lbs): 77.2 lbs Muscle Mass (lbs): 104.8 lbs Total Body Water (lbs): 74 lbs Visceral Fat Rating : 11   Other Clinical Data Fasting: No Labs: No Today's Visit #: 36 Starting Date: 06/27/20    Chief Complaint: OBESITY   History of Present Illness   The patient, with a history of type 2 diabetes, hypertension, and obesity, presents for a routine follow-up. She is currently on Ozempic 2 mg weekly for diabetes management and has been making dietary modifications, specifically reducing simple carbohydrates. Her blood pressure is well-controlled on Benicar, with a recent reading of 108/78. However, she has gained two pounds since the last visit three weeks ago. She reports adherence to her category two eating plan about fifty percent of the time and engages in physical therapy and additional walking for approximately thirty minutes three times a week.  The patient recently celebrated her birthday, during which she had difficulty adhering to her dietary plan due to the nature of the celebrations. She expresses concern about not getting enough protein during these instances and is considering incorporating protein drinks as a supplement. She also expresses a desire to start cooking more at home and is seeking resources for meal planning and preparation.  The patient also reports muscle cramping, primarily in the right calf, and describes her back as a "solid block." She has tried dry needling for her lower back in the past and is considering it for her calf muscle. She is currently off Crestor due  to concerns about potential side effects and is awaiting further consultation with a rheumatologist. She expresses a desire to decrease her risk for heart attacks and is considering different options for cholesterol management.          PHYSICAL EXAM:  Blood pressure 108/78, pulse 65, temperature 98.1 F (36.7 C), height 5\' 5"  (1.651 m), weight 187 lb (84.8 kg), last menstrual period 10/08/2018, SpO2 91%. Body mass index is 31.12 kg/m.  DIAGNOSTIC DATA REVIEWED:  BMET    Component Value Date/Time   NA 144 03/05/2023 0824   NA 142 02/11/2023 1555   K 4.4 03/05/2023 0824   CL 104 03/05/2023 0824   CO2 32 03/05/2023 0824   GLUCOSE 98 03/05/2023 0824   BUN 19 03/05/2023 0824   BUN 22 02/11/2023 1555   CREATININE 0.64 03/05/2023 0824   CALCIUM 9.4 03/05/2023 0824   GFRNONAA 98 06/27/2020 0840   GFRAA 113 06/27/2020 0840   Lab Results  Component Value Date   HGBA1C 5.9 03/05/2023   HGBA1C 6.5 (H) 03/02/2008   Lab Results  Component Value Date   INSULIN 10.3 07/02/2022   INSULIN 19.9 06/27/2020   Lab Results  Component Value Date   TSH 2.44 03/05/2023   CBC    Component Value Date/Time   WBC 6.3 03/05/2023 0824   RBC 4.33 03/05/2023 0824   HGB 13.0 03/05/2023 0824   HGB 13.0 06/27/2020 0840   HCT 40.3 03/05/2023 0824   HCT 39.0 06/27/2020 0840   PLT 238.0 03/05/2023 0824   PLT 268 06/27/2020 0840   MCV 93.3  03/05/2023 0824   MCV 87 06/27/2020 0840   MCH 28.9 06/27/2020 0840   MCH 28.9 09/23/2018 0404   MCHC 32.1 03/05/2023 0824   RDW 14.2 03/05/2023 0824   RDW 14.3 06/27/2020 0840   Iron Studies    Component Value Date/Time   IRON 62 11/09/2008 1037   FERRITIN 3.9 (L) 11/09/2008 1037   IRONPCTSAT 11.3 (L) 11/09/2008 1037   Lipid Panel     Component Value Date/Time   CHOL 201 (H) 03/05/2023 0824   CHOL 161 07/02/2022 1058   TRIG 92.0 03/05/2023 0824   HDL 55.70 03/05/2023 0824   HDL 49 07/02/2022 1058   CHOLHDL 4 03/05/2023 0824   VLDL 18.4  03/05/2023 0824   LDLCALC 127 (H) 03/05/2023 0824   LDLCALC 99 07/02/2022 1058   LDLDIRECT 144.2 09/21/2008 1008   Hepatic Function Panel     Component Value Date/Time   PROT 6.9 03/05/2023 0824   PROT 6.8 02/11/2023 1555   ALBUMIN 4.2 03/05/2023 0824   ALBUMIN 4.2 02/11/2023 1555   AST 20 03/05/2023 0824   ALT 23 03/05/2023 0824   ALKPHOS 76 03/05/2023 0824   BILITOT 0.6 03/05/2023 0824   BILITOT 0.3 02/11/2023 1555   BILIDIR 0.1 03/02/2008 1018      Component Value Date/Time   TSH 2.44 03/05/2023 0824   Nutritional Lab Results  Component Value Date   VD25OH 34.66 03/05/2023   VD25OH 63.3 11/02/2022   VD25OH 73.9 07/02/2022     Assessment and Plan    Type 2 Diabetes Mellitus   Type 2 diabetes mellitus, managed with Ozempic 2 mg weekly. Weight gain of 2 pounds since the last visit. Emphasized balanced diet, adequate hydration, and protein intake for metabolism and muscle function.   - Refill Ozempic 2 mg weekly   - Continue dietary modifications to reduce simple carbohydrates   - Encourage adherence to exercise regimen   - Recommend protein intake of at least 80 grams per day    Obesity   Obesity with recent weight gain of 2 pounds. Following a category two eating plan 50% of the time and exercising 30 minutes three times a week. Discussed using ChatGPT for meal planning and grocery lists to aid dietary adherence.   - Encourage adherence to category two eating plan   - Increase exercise regimen adherence   - Use ChatGPT for meal planning and grocery lists    Hypertension   Hypertension, well-controlled on Benicar with BP 108/78 mmHg.   - Continue Benicar as prescribed    Hyperlipidemia   Hyperlipidemia with statin intolerance. Seeing a rheumatologist for abnormal results and potential Sjogren's syndrome. Discussed alternative lipid-lowering therapies like PCSK9 inhibitors, noting their potency, cost, and insurance requirements.   - Discuss with rheumatologist  about reinitiating Crestor   - Consider PCSK9 inhibitors if statins are not tolerated    Muscle Cramping  R calf Muscle cramping in the right calf and back, exacerbated by physical activity. Magnesium levels are normal. Discussed potential benefit of dry needling with physical therapy.   - Consider dry needling with physical therapy   - Encourage adequate hydration    Follow-up   - Ensure follow-up with Dr. Val Eagle as scheduled.         I have personally spent 40 minutes total time today in preparation, patient care, and documentation for this visit, including the following: review of clinical lab tests; review of medical tests/procedures/services.    She was informed of the importance of frequent follow  up visits to maximize her success with intensive lifestyle modifications for her multiple health conditions.    Quillian Quince, MD

## 2023-03-29 DIAGNOSIS — H57813 Brow ptosis, bilateral: Secondary | ICD-10-CM | POA: Diagnosis not present

## 2023-03-29 DIAGNOSIS — H02839 Dermatochalasis of unspecified eye, unspecified eyelid: Secondary | ICD-10-CM | POA: Diagnosis not present

## 2023-03-29 DIAGNOSIS — H5340 Unspecified visual field defects: Secondary | ICD-10-CM | POA: Diagnosis not present

## 2023-04-07 ENCOUNTER — Ambulatory Visit (INDEPENDENT_AMBULATORY_CARE_PROVIDER_SITE_OTHER): Payer: BC Managed Care – PPO | Admitting: Family Medicine

## 2023-04-07 ENCOUNTER — Telehealth: Payer: Self-pay | Admitting: *Deleted

## 2023-04-07 ENCOUNTER — Other Ambulatory Visit: Payer: Self-pay | Admitting: Family Medicine

## 2023-04-07 VITALS — BP 138/86 | HR 66 | Ht 65.0 in | Wt 193.0 lb

## 2023-04-07 DIAGNOSIS — M533 Sacrococcygeal disorders, not elsewhere classified: Secondary | ICD-10-CM

## 2023-04-07 DIAGNOSIS — M791 Myalgia, unspecified site: Secondary | ICD-10-CM

## 2023-04-07 DIAGNOSIS — M79622 Pain in left upper arm: Secondary | ICD-10-CM | POA: Diagnosis not present

## 2023-04-07 DIAGNOSIS — M7918 Myalgia, other site: Secondary | ICD-10-CM | POA: Diagnosis not present

## 2023-04-07 DIAGNOSIS — G8929 Other chronic pain: Secondary | ICD-10-CM

## 2023-04-07 DIAGNOSIS — M5412 Radiculopathy, cervical region: Secondary | ICD-10-CM | POA: Diagnosis not present

## 2023-04-07 NOTE — Addendum Note (Signed)
Addended by: Adron Bene on: 04/07/2023 02:19 PM   Modules accepted: Orders

## 2023-04-07 NOTE — Telephone Encounter (Signed)
[facility] called the office pt's MRI was cancelled that was scheduled on 04/11/23 saying that the referral dpt didn't do the authorization part for her insurance. PCP didn't order MRI looks like Dr. Denyse Amass with sports med ordered it today. They are asking that you get authorization approved and then call [facility] back to let them know so they can get pt r/s

## 2023-04-07 NOTE — Patient Instructions (Addendum)
Thank you for coming in today.   You should hear from MRI scheduling within 1 week. If you do not hear please let me know.    Continue PT and consider dry needling.  Check back in 3 months from 10/8 for a repeat SI joint injection.  Check back after we get the MRI results back

## 2023-04-07 NOTE — Addendum Note (Signed)
Addended by: Evon Slack on: 04/07/2023 03:59 PM   Modules accepted: Orders

## 2023-04-07 NOTE — Addendum Note (Signed)
Addended by: Debbe Odea R on: 04/07/2023 11:00 AM   Modules accepted: Orders

## 2023-04-07 NOTE — Progress Notes (Signed)
Rubin Payor, PhD, LAT, ATC acting as a scribe for Clementeen Graham, MD.  Patty Williams is a 62 y.o. female who presents to [facility] today for f/u back pain. Pt was last seen by Dr. Denyse Amass on 02/26/23. Based on lab findings, she was referred to rheumatology.   Today, pt reports she was seen by rheum, who ordered extensive labs, but she has not yet heard about the results. She is wondering if she can resume taking her Crestor. Pt reports improvement in her back pain after her R SI joint steroid injection done on Oct 8th. She is needing a written order for the diagnostic mammogram.  She c/o mid-back pain ongoing intermittently for 2 years now, w/ increased frequency and duration. Pt locates pain to in between scapula along the bra-line w/ tingling along this area. No radiating pain. She will be performing a complicated dental procedure next week and she is nervous about managing this pain.  Dx testing: 02/26/23 C-spine, T-spine, L-spine, & sacrum/coccyx XR 02/16/23 Labs 02/11/23 Labs  Pertinent review of systems: No fevers or chills  Relevant historical information: Hypertension and diabetes.   Exam:  BP 138/86   Pulse 66   Ht 5\' 5"  (1.651 m)   Wt 193 lb (87.5 kg)   LMP 10/08/2018 (Exact Date)   SpO2 99%   BMI 32.12 kg/m  General: Well Developed, well nourished, and in no acute distress.   MSK: C-spine decreased cervical motion.  Tender palpation cervical paraspinal musculature.      Assessment and Plan: 62 y.o. female with neck and upper back pain with pain radiating to the left upper arm thought to be potentially cervical radiculopathy or referred pain from degenerative changes in the C-spine.  She has had a good trial of physical therapy over the last longer than 6 months.  She goes to physical therapy in [place].  Will proceed with MRI cervical spine to evaluate for the paresthesias and pain in her upper back and the pain and paresthesias  radiating down her left upper arm thought to be perhaps C8 cervical radiculopathy. Will go ahead and write a specific PT order to allow for dry needling in the upper back as well.  Additionally I recommended a diagnostic mammogram to ask for help evaluate that left axillary pain.  She needs an order for that printed out to take to [place] today.  Thankfully her low back pain improved considerably following a trial of an SI joint injection.  Rheumatology evaluation is still pending.  PDMP not reviewed this encounter. Orders Placed This Encounter  Procedures   MR CERVICAL SPINE WO CONTRAST    [employer] EPIC  WT: 187 HT: 5'5 / NO NEEDS/NO CLAUS/** PT AWARE OF 75$ NO SHOW FEE**NO METAL REMOVED BY DOCTOR/ NO IMPLANTS/ NO BULLETS OR BB'S/ NO GLUCOSE MONITOR, STIMULATOR OR INJECTORS DEFIBRULLATOR NO PORT NO BRAIN CLIP / NO PREV SX/ NO BRAIN HEART OR EAR SX / Sherron Monday W PATIENT JW 04/07/23    Standing Status:   Future    Standing Expiration Date:   04/06/2024    Order Specific Question:   What is the patient's sedation requirement?    Answer:   No Sedation    Order Specific Question:   Does the patient have a pacemaker or implanted devices?    Answer:   No    Order Specific Question:   Preferred imaging location?    Answer:   [facility] (table limit-550lbs)  MM 3D SCREENING MAMMOGRAM UNILATERAL LEFT BREAST W/IMPLANT    Standing Status:   Future    Standing Expiration Date:   04/06/2024    Order Specific Question:   Reason for Exam (SYMPTOM  OR DIAGNOSIS REQUIRED)    Answer:   axillary pain    Order Specific Question:   Preferred imaging location?    Answer:   [facility]   Ambulatory referral to Physical Therapy    Referral Priority:   Routine    Referral Type:   Physical Medicine    Referral Reason:   Specialty Services Required    Requested Specialty:   Physical Therapy    Number of Visits Requested:   1   Ambulatory referral to Physical Therapy    Referral Priority:   Routine     Referral Type:   Physical Medicine    Referral Reason:   Specialty Services Required    Requested Specialty:   Physical Therapy    Number of Visits Requested:   1   No orders of the defined types were placed in this encounter.    Discussed warning signs or symptoms. Please see discharge instructions. Patient expresses understanding.   The above documentation has been reviewed and is accurate and complete Clementeen Graham, M.D. Total encounter time 30 minutes including face-to-face time with the patient and, reviewing past medical record, and charting on the date of service.

## 2023-04-11 ENCOUNTER — Other Ambulatory Visit: Payer: BC Managed Care – PPO

## 2023-04-11 ENCOUNTER — Ambulatory Visit
Admission: RE | Admit: 2023-04-11 | Discharge: 2023-04-11 | Disposition: A | Discharge status: Home / Self Care | Payer: BC Managed Care – PPO | Source: Ambulatory Visit | Attending: Family Medicine | Admitting: Family Medicine

## 2023-04-11 DIAGNOSIS — M50322 Other cervical disc degeneration at C5-C6 level: Secondary | ICD-10-CM | POA: Diagnosis not present

## 2023-04-11 DIAGNOSIS — M5412 Radiculopathy, cervical region: Secondary | ICD-10-CM

## 2023-04-11 DIAGNOSIS — M47812 Spondylosis without myelopathy or radiculopathy, cervical region: Secondary | ICD-10-CM | POA: Diagnosis not present

## 2023-04-11 DIAGNOSIS — G4733 Obstructive sleep apnea (adult) (pediatric): Secondary | ICD-10-CM | POA: Diagnosis not present

## 2023-04-11 DIAGNOSIS — M4802 Spinal stenosis, cervical region: Secondary | ICD-10-CM | POA: Diagnosis not present

## 2023-04-12 ENCOUNTER — Other Ambulatory Visit: Payer: Self-pay | Admitting: Family Medicine

## 2023-04-13 ENCOUNTER — Telehealth: Payer: Self-pay | Admitting: Family Medicine

## 2023-04-13 NOTE — Telephone Encounter (Signed)
Please change PT referral from [facility] TO:  [facility] [phone] Fax [phone]

## 2023-04-13 NOTE — Telephone Encounter (Signed)
Dr. Maple Hudson refill last. Please advise

## 2023-04-13 NOTE — Telephone Encounter (Signed)
Please ask her if she is switching these to me -if so that is fine/ just want to be sure

## 2023-04-14 NOTE — Telephone Encounter (Signed)
Faxed to new location

## 2023-04-14 NOTE — Telephone Encounter (Signed)
Called patient she states that if you would like to fill she would like you to Dr. Maple Hudson will be retiring soon.

## 2023-04-15 ENCOUNTER — Encounter (INDEPENDENT_AMBULATORY_CARE_PROVIDER_SITE_OTHER): Payer: Self-pay | Admitting: Family Medicine

## 2023-04-15 ENCOUNTER — Ambulatory Visit (INDEPENDENT_AMBULATORY_CARE_PROVIDER_SITE_OTHER): Payer: BC Managed Care – PPO | Admitting: Family Medicine

## 2023-04-15 ENCOUNTER — Encounter: Payer: Self-pay | Admitting: Family Medicine

## 2023-04-15 VITALS — BP 115/70 | HR 88 | Temp 98.2°F | Ht 65.0 in | Wt 188.0 lb

## 2023-04-15 DIAGNOSIS — E1169 Type 2 diabetes mellitus with other specified complication: Secondary | ICD-10-CM | POA: Diagnosis not present

## 2023-04-15 DIAGNOSIS — Z6831 Body mass index (BMI) 31.0-31.9, adult: Secondary | ICD-10-CM | POA: Diagnosis not present

## 2023-04-15 DIAGNOSIS — Z7985 Long-term (current) use of injectable non-insulin antidiabetic drugs: Secondary | ICD-10-CM

## 2023-04-15 DIAGNOSIS — E669 Obesity, unspecified: Secondary | ICD-10-CM

## 2023-04-15 MED ORDER — OZEMPIC (2 MG/DOSE) 8 MG/3ML ~~LOC~~ SOPN: 2.0000 mg | PEN_INJECTOR | SUBCUTANEOUS | 0 refills | Status: DC

## 2023-04-15 NOTE — Progress Notes (Signed)
Carlye Grippe, D.O.  ABFM, ABOM Specializing in Clinical Bariatric Medicine  Office located at: [address] [place], [place]  [address]   Assessment and Plan:   FOR THE DISEASE OF OBESITY: BMI 31.0-31.9,adult - current BMI 31.28 Obesity, Beginning BMI 38.11 Assessment & Plan: Since last office visit on 03/25/23 patient's  muscle mass has increased by 1.6 lb. Fat mass has decreased by 0.4 lb. Total body water has increased by 3.6 lb.  Counseling done on how various foods will affect these numbers and how to maximize success  Total lbs lost to date: 41 lbs  Total weight loss percentage to date: 17.90%    Recommended Dietary Goals Nakima is currently in the action stage of change. As such, her goal is to continue weight management plan.  She has agreed to: continue current plan   Behavioral Intervention We discussed the following today: exploring Long-Life meal prep, focusing on food with a 10:1 ratio of calories: grams of protein and using GPT or another AI platform for recipe ideas- searching "low calorie, low carb, high protein chicken recipes" etc  Additional resources provided today: None  Evidence-based interventions for health behavior change were utilized today including the discussion of self monitoring techniques, problem-solving barriers and SMART goal setting techniques.   Regarding patient's less desirable eating habits and patterns, we employed the technique of small changes.   Pt will specifically work on: journaling intake at least 4 days a week.    Recommended Physical Activity Goals Tawanna has been advised to work up to 150 minutes of moderate intensity aerobic activity a week and strengthening exercises 2-3 times per week for cardiovascular health, weight loss maintenance and preservation of muscle mass.   She has agreed to : Continue current level of physical activity    Pharmacotherapy We both agreed to : continue current anti-obesity medication  regimen   FOR ASSOCIATED CONDITIONS ADDRESSED TODAY:  Type 2 diabetes mellitus with other specified complication, unspecified whether long term insulin use [facility]) Assessment & Plan: Lab Results  Component Value Date   HGBA1C 5.9 03/05/2023   HGBA1C 5.7 (H) 11/02/2022   HGBA1C 5.7 (H) 07/08/2022   INSULIN 10.3 07/02/2022   INSULIN 19.5 12/03/2021   INSULIN 13.2 07/08/2021    T2DM managed with Ozempic 1 mg once a week - per pt, Dr.Beasley showed her how to administer 1 mg Ozempic from a 2 mg pen. She is tolerating medication well, denies any N/V/D. Her hunger and cravings are well controlled. She will maintain with Ozempic at current dose. Continue to decrease simple carbs/ sugars; increase fiber and proteins -> follow her meal plan.    Orders: -     Ozempic (2 MG/DOSE); Inject 2 mg into the skin once a week.  Dispense: 3 mL; Refill: 0   Follow up:   Return 04/27/23. She was informed of the importance of frequent follow up visits to maximize her success with intensive lifestyle modifications for her multiple health conditions.  Subjective:   Chief complaint: Obesity Lakeyda is here to discuss her progress with her obesity treatment plan. She is on the the Category 2 Plan and states she is following her eating plan approximately 60% of the time. She states she is walking/strength training 20 minutes, 3 days per week.   Interval History:  Adrienne Mocha is here for a follow up office visit. Since last OV,  Maddalena is up 1 lb. She endorses being very protein focused, but admits to "overshooting  in the calories by 300 or so" on some days. No complaints of hunger and cravings today.   Pharmacotherapy for weight loss: She is currently taking  Ozempic 2 mg once a week .   Review of Systems:  Pertinent positives were addressed with patient today.  Reviewed by clinician on day of visit: allergies, medications, problem list, medical history, surgical history, family history, social history, and  previous encounter notes.  Weight Summary and Biometrics   Weight Lost Since Last Visit: 0lb  Weight Gained Since Last Visit: 1lb    Vitals Temp: 98.2 F (36.8 C) BP: 115/70 Pulse Rate: 88 SpO2: 95 %   Anthropometric Measurements Height: 5\' 5"  (1.651 m) Weight: 188 lb (85.3 kg) BMI (Calculated): 31.28 Weight at Last Visit: 187lb Weight Lost Since Last Visit: 0lb Weight Gained Since Last Visit: 1lb Starting Weight: 229lb Total Weight Loss (lbs): 41 lb (18.6 kg)   Body Composition  Body Fat %: 40.7 % Fat Mass (lbs): 76.8 lbs Muscle Mass (lbs): 106.4 lbs Total Body Water (lbs): 77.6 lbs Visceral Fat Rating : 11   Other Clinical Data Fasting: no Labs: no Today's Visit #: 37 Starting Date: 06/27/20   Objective:   PHYSICAL EXAM: Blood pressure 115/70, pulse 88, temperature 98.2 F (36.8 C), height 5\' 5"  (1.651 m), weight 188 lb (85.3 kg), last menstrual period 10/08/2018, SpO2 95%. Body mass index is 31.28 kg/m.  General: she is overweight, cooperative and in no acute distress. PSYCH: Has normal mood, affect and thought process.   HEENT: EOMI, sclerae are anicteric. Lungs: Normal breathing effort, no conversational dyspnea. Extremities: Moves * 4 Neurologic: A and O * 3, good insight  DIAGNOSTIC DATA REVIEWED: BMET    Component Value Date/Time   NA 144 03/05/2023 0824   NA 142 02/11/2023 1555   K 4.4 03/05/2023 0824   CL 104 03/05/2023 0824   CO2 32 03/05/2023 0824   GLUCOSE 98 03/05/2023 0824   BUN 19 03/05/2023 0824   BUN 22 02/11/2023 1555   CREATININE 0.64 03/05/2023 0824   CALCIUM 9.4 03/05/2023 0824   GFRNONAA 98 06/27/2020 0840   GFRAA 113 06/27/2020 0840   Lab Results  Component Value Date   HGBA1C 5.9 03/05/2023   HGBA1C 6.5 (H) 03/02/2008   Lab Results  Component Value Date   INSULIN 10.3 07/02/2022   INSULIN 19.9 06/27/2020   Lab Results  Component Value Date   TSH 2.44 03/05/2023   CBC    Component Value Date/Time   WBC  6.3 03/05/2023 0824   RBC 4.33 03/05/2023 0824   HGB 13.0 03/05/2023 0824   HGB 13.0 06/27/2020 0840   HCT 40.3 03/05/2023 0824   HCT 39.0 06/27/2020 0840   PLT 238.0 03/05/2023 0824   PLT 268 06/27/2020 0840   MCV 93.3 03/05/2023 0824   MCV 87 06/27/2020 0840   MCH 28.9 06/27/2020 0840   MCH 28.9 09/23/2018 0404   MCHC 32.1 03/05/2023 0824   RDW 14.2 03/05/2023 0824   RDW 14.3 06/27/2020 0840   Iron Studies    Component Value Date/Time   IRON 62 11/09/2008 1037   FERRITIN 3.9 (L) 11/09/2008 1037   IRONPCTSAT 11.3 (L) 11/09/2008 1037   Lipid Panel     Component Value Date/Time   CHOL 201 (H) 03/05/2023 0824   CHOL 161 07/02/2022 1058   TRIG 92.0 03/05/2023 0824   HDL 55.70 03/05/2023 0824   HDL 49 07/02/2022 1058   CHOLHDL 4 03/05/2023 0824   VLDL  18.4 03/05/2023 0824   LDLCALC 127 (H) 03/05/2023 0824   LDLCALC 99 07/02/2022 1058   LDLDIRECT 144.2 09/21/2008 1008   Hepatic Function Panel     Component Value Date/Time   PROT 6.9 03/05/2023 0824   PROT 6.8 02/11/2023 1555   ALBUMIN 4.2 03/05/2023 0824   ALBUMIN 4.2 02/11/2023 1555   AST 20 03/05/2023 0824   ALT 23 03/05/2023 0824   ALKPHOS 76 03/05/2023 0824   BILITOT 0.6 03/05/2023 0824   BILITOT 0.3 02/11/2023 1555   BILIDIR 0.1 03/02/2008 1018      Component Value Date/Time   TSH 2.44 03/05/2023 0824   Nutritional Lab Results  Component Value Date   VD25OH 34.66 03/05/2023   VD25OH 63.3 11/02/2022   VD25OH 73.9 07/02/2022    Attestations:   I, Special Puri, acting as a Stage manager for [employer], DO., have compiled all relevant documentation for today's office visit on behalf of Thomasene Lot, DO, while in the presence of [employer], DO.  I have reviewed the above documentation for accuracy and completeness, and I agree with the above. Carlye Grippe, D.O.  The 21st Century Cures Act was signed into law in 2016 which includes the topic of electronic health records.  This provides  immediate access to information in MyChart.  This includes consultation notes, operative notes, office notes, lab results and pathology reports.  If you have any questions about what you read please let us know at your next visit so we can discuss your concerns and take corrective action if need be.  We are right here with you.

## 2023-04-16 DIAGNOSIS — M546 Pain in thoracic spine: Secondary | ICD-10-CM | POA: Diagnosis not present

## 2023-04-16 MED ORDER — PREDNISONE 50 MG PO TABS: 50.0000 mg | ORAL_TABLET | Freq: Every day | ORAL | 0 refills | Status: DC

## 2023-04-19 DIAGNOSIS — M546 Pain in thoracic spine: Secondary | ICD-10-CM | POA: Diagnosis not present

## 2023-04-20 DIAGNOSIS — M79622 Pain in left upper arm: Secondary | ICD-10-CM | POA: Diagnosis not present

## 2023-04-20 DIAGNOSIS — M791 Myalgia, unspecified site: Secondary | ICD-10-CM | POA: Diagnosis not present

## 2023-04-20 LAB — HM MAMMOGRAPHY

## 2023-04-26 ENCOUNTER — Encounter: Payer: Self-pay | Admitting: Family Medicine

## 2023-04-26 ENCOUNTER — Telehealth: Payer: Self-pay | Admitting: Family Medicine

## 2023-04-26 DIAGNOSIS — M5412 Radiculopathy, cervical region: Secondary | ICD-10-CM

## 2023-04-26 DIAGNOSIS — M546 Pain in thoracic spine: Secondary | ICD-10-CM | POA: Diagnosis not present

## 2023-04-26 NOTE — Telephone Encounter (Signed)
Cervical epidural steroid injection ordered

## 2023-04-26 NOTE — Progress Notes (Signed)
Cervical spine MRI shows areas where the nerves are being pinched causing left arm pain.  Typically next step would be an epidural steroid injection.  I think that could help.  I have ordered the injection.  [facility] should contact you soon.  Their phone number for scheduling is [phone]

## 2023-04-26 NOTE — Telephone Encounter (Signed)
Forwarding to Dr. Corey to review and advise.  

## 2023-04-27 ENCOUNTER — Ambulatory Visit (INDEPENDENT_AMBULATORY_CARE_PROVIDER_SITE_OTHER): Payer: BC Managed Care – PPO | Admitting: Family Medicine

## 2023-04-27 ENCOUNTER — Encounter (INDEPENDENT_AMBULATORY_CARE_PROVIDER_SITE_OTHER): Payer: Self-pay

## 2023-04-29 ENCOUNTER — Encounter: Payer: Self-pay | Admitting: Family Medicine

## 2023-05-10 NOTE — Discharge Instructions (Signed)

## 2023-05-11 ENCOUNTER — Ambulatory Visit
Admission: RE | Admit: 2023-05-11 | Discharge: 2023-05-11 | Disposition: A | Discharge status: Home / Self Care | Payer: BC Managed Care – PPO | Source: Ambulatory Visit | Attending: Family Medicine | Admitting: Family Medicine

## 2023-05-11 DIAGNOSIS — M5412 Radiculopathy, cervical region: Secondary | ICD-10-CM

## 2023-05-11 DIAGNOSIS — M4722 Other spondylosis with radiculopathy, cervical region: Secondary | ICD-10-CM | POA: Diagnosis not present

## 2023-05-11 MED ORDER — TRIAMCINOLONE ACETONIDE 40 MG/ML IJ SUSP (RADIOLOGY)
60.0000 mg | Freq: Once | INTRAMUSCULAR | Status: AC
  Administered 2023-05-11: 60 mg via EPIDURAL

## 2023-05-11 MED ORDER — IOPAMIDOL (ISOVUE-M 300) INJECTION 61%
1.0000 mL | Freq: Once | INTRAMUSCULAR | Status: AC | PRN
Start: 2023-05-11 — End: 2023-05-11
  Administered 2023-05-11: 1 mL via EPIDURAL

## 2023-05-12 DIAGNOSIS — G4733 Obstructive sleep apnea (adult) (pediatric): Secondary | ICD-10-CM | POA: Diagnosis not present

## 2023-05-13 DIAGNOSIS — M546 Pain in thoracic spine: Secondary | ICD-10-CM | POA: Diagnosis not present

## 2023-05-14 DIAGNOSIS — G4733 Obstructive sleep apnea (adult) (pediatric): Secondary | ICD-10-CM | POA: Diagnosis not present

## 2023-05-18 ENCOUNTER — Ambulatory Visit (INDEPENDENT_AMBULATORY_CARE_PROVIDER_SITE_OTHER): Payer: BC Managed Care – PPO | Admitting: Family Medicine

## 2023-05-18 ENCOUNTER — Encounter (INDEPENDENT_AMBULATORY_CARE_PROVIDER_SITE_OTHER): Payer: Self-pay | Admitting: Family Medicine

## 2023-05-18 VITALS — BP 97/63 | HR 66 | Temp 98.2°F | Ht 65.0 in | Wt 188.0 lb

## 2023-05-18 DIAGNOSIS — Z6831 Body mass index (BMI) 31.0-31.9, adult: Secondary | ICD-10-CM

## 2023-05-18 DIAGNOSIS — R252 Cramp and spasm: Secondary | ICD-10-CM

## 2023-05-18 DIAGNOSIS — E669 Obesity, unspecified: Secondary | ICD-10-CM | POA: Diagnosis not present

## 2023-05-18 DIAGNOSIS — I10 Essential (primary) hypertension: Secondary | ICD-10-CM | POA: Diagnosis not present

## 2023-05-18 DIAGNOSIS — Z7985 Long-term (current) use of injectable non-insulin antidiabetic drugs: Secondary | ICD-10-CM

## 2023-05-18 DIAGNOSIS — E1169 Type 2 diabetes mellitus with other specified complication: Secondary | ICD-10-CM

## 2023-05-18 DIAGNOSIS — E119 Type 2 diabetes mellitus without complications: Secondary | ICD-10-CM

## 2023-05-18 MED ORDER — OZEMPIC (2 MG/DOSE) 8 MG/3ML ~~LOC~~ SOPN: 2.0000 mg | PEN_INJECTOR | SUBCUTANEOUS | 0 refills | Status: DC

## 2023-05-18 NOTE — Progress Notes (Signed)
 .smr  Office: [phone]  /  Fax: [phone]  WEIGHT SUMMARY AND BIOMETRICS  Anthropometric Measurements Height: 5' 5 (1.651 m) Weight: 188 lb (85.3 kg) BMI (Calculated): 31.28 Weight at Last Visit: 188 lb Weight Lost Since Last Visit: 0 Weight Gained Since Last Visit: 0 Starting Weight: 229 lb Total Weight Loss (lbs): 41 lb (18.6 kg)   Body Composition  Body Fat %: 40.2 % Fat Mass (lbs): 75.6 lbs Muscle Mass (lbs): 106.8 lbs Total Body Water (lbs): 71.6 lbs Visceral Fat Rating : 11   Other Clinical Data Fasting: No Labs: No Today's Visit #: 34 Starting Date: 06/27/20    Chief Complaint: OBESITY   History of Present Illness   The patient, a retiree with a history of type 2 diabetes, hypertension, and obesity, presents for a routine follow-up. The patient's blood pressure is well-controlled on olmesartan  40mg  daily. She is also on Ozempic  2mg  weekly for diabetes management. The patient has been adhering to a diet and exercise regimen, maintaining her weight over the holiday period and following a category two eating plan about 40% of the time. She reports engaging in walking and physical therapy for about 20 minutes, three times per week.  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  The patient has been experiencing lightheadedness, particularly during physical therapy sessions when changing positions. She has self-adjusted her olmesartan  dosage on occasion due to these symptoms. The patient also reports leg cramps, particularly at night, but is reluctant to discontinue Crestor  due to these symptoms.  The patient's dietary habits have been challenged by the holiday season, with instances of eating out and ordering in. She expresses difficulty in  portion control and making healthy choices when faced with limited options. Despite these challenges, the patient managed to maintain her weight over the holiday period.  The patient's insurance will change at the end of February, which may affect her access to Ozempic . She expresses a desire to have her lab work done in February before the insurance change. The patient also reports difficulty in staying hydrated, acknowledging this as a current challenge. She has been experimenting with different recipes and cooking methods to manage her calorie and protein intake. She expresses interest in learning more about meal planning and cooking for health.          PHYSICAL EXAM:  Blood pressure 97/63, pulse 66, temperature 98.2 F (36.8 C), height 5' 5 (1.651 m), weight 188 lb (85.3 kg), last menstrual period 10/08/2018, SpO2 97%. Body mass index is 31.28 kg/m.  DIAGNOSTIC DATA REVIEWED:  BMET    Component Value Date/Time   NA 144 03/05/2023 0824   NA 142 02/11/2023 1555   K 4.4 03/05/2023 0824   CL 104 03/05/2023 0824   CO2 32 03/05/2023 0824   GLUCOSE 98 03/05/2023 0824   BUN 19 03/05/2023 0824   BUN 22 02/11/2023 1555   CREATININE 0.64 03/05/2023 0824   CALCIUM  9.4 03/05/2023 0824   GFRNONAA 98 06/27/2020 0840   GFRAA 113 06/27/2020 0840   Lab Results  Component Value Date   HGBA1C 5.9 03/05/2023   HGBA1C 6.5 (H) 03/02/2008   Lab Results  Component Value Date   INSULIN  10.3 07/02/2022   INSULIN  19.9 06/27/2020   Lab Results  Component Value Date   TSH 2.44 03/05/2023  CBC    Component Value Date/Time   WBC 6.3 03/05/2023 0824   RBC 4.33 03/05/2023 0824   HGB 13.0 03/05/2023 0824   HGB 13.0 06/27/2020 0840   HCT 40.3 03/05/2023 0824   HCT 39.0 06/27/2020 0840   PLT 238.0 03/05/2023 0824   PLT 268 06/27/2020 0840   MCV 93.3 03/05/2023 0824   MCV 87 06/27/2020 0840   MCH 28.9 06/27/2020 0840   MCH 28.9 09/23/2018 0404   MCHC 32.1 03/05/2023 0824   RDW 14.2  03/05/2023 0824   RDW 14.3 06/27/2020 0840   Iron Studies    Component Value Date/Time   IRON 62 11/09/2008 1037   FERRITIN 3.9 (L) 11/09/2008 1037   IRONPCTSAT 11.3 (L) 11/09/2008 1037   Lipid Panel     Component Value Date/Time   CHOL 201 (H) 03/05/2023 0824   CHOL 161 07/02/2022 1058   TRIG 92.0 03/05/2023 0824   HDL 55.70 03/05/2023 0824   HDL 49 07/02/2022 1058   CHOLHDL 4 03/05/2023 0824   VLDL 18.4 03/05/2023 0824   LDLCALC 127 (H) 03/05/2023 0824   LDLCALC 99 07/02/2022 1058   LDLDIRECT 144.2 09/21/2008 1008   Hepatic Function Panel     Component Value Date/Time   PROT 6.9 03/05/2023 0824   PROT 6.8 02/11/2023 1555   ALBUMIN 4.2 03/05/2023 0824   ALBUMIN 4.2 02/11/2023 1555   AST 20 03/05/2023 0824   ALT 23 03/05/2023 0824   ALKPHOS 76 03/05/2023 0824   BILITOT 0.6 03/05/2023 0824   BILITOT 0.3 02/11/2023 1555   BILIDIR 0.1 03/02/2008 1018      Component Value Date/Time   TSH 2.44 03/05/2023 0824   Nutritional Lab Results  Component Value Date   VD25OH 34.66 03/05/2023   VD25OH 63.3 11/02/2022   VD25OH 73.9 07/02/2022     Assessment and Plan    Type 2 Diabetes Mellitus Type 2 diabetes mellitus, managed with Ozempic  2 mg weekly. Patient adheres to diet and exercise regimen 40% and 20 minutes three times per week, respectively. Maintained weight over holidays. Last HbA1c was 6.5%. Discussed insurance changes and importance of Ozempic . - Continue Ozempic  2 mg weekly - Order lab work in February - Refill Ozempic  prescription  Hypertension Hypertension, well-controlled with olmesartan  40 mg daily. Blood pressure today is 97/63 mmHg. Patient reports lightheadedness during physical therapy. Discussed reducing olmesartan  to 20 mg daily to prevent hypotension while maintaining cardiac and renal benefits. - Reduce olmesartan  to 20 mg daily - Advise patient to monitor blood pressure and symptoms - Encourage hydration  Obesity Obesity, patient actively  managing weight through diet and exercise. Maintained weight over holidays. Discussed using ChatGPT for meal planning and recipes. - Continue current diet and exercise regimen - Encourage use of ChatGPT for meal planning and recipes - Consider attending cooking classes at [facility]  Muscle Cramps Muscle cramps in calves and feet, possibly related to Crestor . Patient taking magnesium glycinate. Discussed switching to magnesium citrate, which may also help with constipation, a common side effect of GLP-1 agonists. - Recommend magnesium citrate, 50 mg at night - Consider adding melatonin for sleep  General Health Maintenance Discussed hydration and dietary habits. Emphasized calorie-free and caffeine-free beverages for hydration. - Encourage hydration with calorie-free and caffeine-free beverages - Advise on dietary choices focusing on protein and vegetables - Discussed using ChatGPT for meal planning and dietary substitutions  Follow-up - Schedule next appointment 4 weeks         She was informed of the  importance of frequent follow up visits to maximize her success with intensive lifestyle modifications for her multiple health conditions.    Louann Penton, MD

## 2023-05-19 NOTE — Progress Notes (Signed)
 LILLETTE Ileana Collet, PhD, LAT, ATC acting as a scribe for Artist Lloyd, MD.  Patty Williams is a 63 y.o. female who presents to [facility] at [facility] today for f/u thoracic back pain and cervical radiculitis w/ MRI review. Pt was last seen by Dr. Lloyd on 04/07/23 and a MRI was ordered and she was advised to cont PT, completing 4 visits. Based on MRI findings ESI was ordered; performed on 12/31.  Today, pt reports improvement in L arm pain, from axilla to elbow. She c/o cont'd numbness between her scapula. Her pain is exacerbated when performing dental procedures. She was seen by chiro yesterday and has another PT visit next wk.   Dx testing: 04/11/23 C-spine MRI 02/26/23 C-spine, T-spine, L-spine, & sacrum/coccyx XR 02/16/23 Labs 02/11/23 Labs  Pertinent review of systems: No fevers or chills  Relevant historical information: Hypertension and diabetes   Exam:  BP 112/78   Pulse 70   Ht 5' 5 (1.651 m)   Wt 196 lb (88.9 kg)   LMP 10/08/2018 (Exact Date)   SpO2 98%   BMI 32.62 kg/m  General: Well Developed, well nourished, and in no acute distress.   MSK: T-spine tender palpation bilateral thoracic paraspinal muscular/rhomboid musculature.    Lab and Radiology Results  Procedure: Real-time Ultrasound Guided Injection of trigger point right rhomboid Device: Philips Affiniti 50G/GE Logiq Images permanently stored and available for review in PACS Verbal informed consent obtained.  Discussed risks and benefits of procedure. Warned about infection, bleeding, hyperglycemia damage to structures, risk of pneumothorax among others. Patient expresses understanding and agreement Time-out conducted.   Noted no overlying erythema, induration, or other signs of local infection.   Skin prepped in a sterile fashion.   Local anesthesia: Topical Ethyl chloride.   With sterile technique and under real time ultrasound guidance: 40 mg of Kenalog  and 1 mL of Marcaine  injected  into right rhomboid trigger point. Fluid seen entering the trigger poin.   Completed without difficulty   Pain immediately resolved suggesting accurate placement of the medication.   Advised to call if fevers/chills, erythema, induration, drainage, or persistent bleeding.   Images permanently stored and available for review in the ultrasound unit.  Impression: Technically successful ultrasound guided injection.    Procedure: Real-time Ultrasound Guided Injection of left rhomboid trigger point Device: Philips Affiniti 50G/GE Logiq Images permanently stored and available for review in PACS Verbal informed consent obtained.  Discussed risks and benefits of procedure. Warned about infection, bleeding, hyperglycemia damage to structures, risk of pneumothorax among others. Patient expresses understanding and agreement Time-out conducted.   Noted no overlying erythema, induration, or other signs of local infection.   Skin prepped in a sterile fashion.   Local anesthesia: Topical Ethyl chloride.   With sterile technique and under real time ultrasound guidance: 40 mg of Kenalog  and 1 mL of Marcaine  injected into left rhomboid trigger point. Fluid seen entering the trigger point.   Completed without difficulty   Pain immediately resolved suggesting accurate placement of the medication.   Advised to call if fevers/chills, erythema, induration, drainage, or persistent bleeding.   Images permanently stored and available for review in the ultrasound unit.  Impression: Technically successful ultrasound guided injection.        Assessment and Plan: 63 y.o. 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  She has had good trials of conservative management read including physical therapy.  Will add trigger point injections today to  rhomboids.  Recheck in about a month.  Consider SI joint injection at that time.  That will be 4 months since her previous SI joint  injection where she still getting benefit from.   PDMP not reviewed this encounter. Orders Placed This Encounter  Procedures   US  LIMITED JOINT SPACE STRUCTURES LOW LEFT(NO LINKED CHARGES)    Reason for Exam (SYMPTOM  OR DIAGNOSIS REQUIRED):   SI joint pain    Preferred imaging location?:    [facility]   No orders of the defined types were placed in this encounter.    Discussed warning signs or symptoms. Please see discharge instructions. Patient expresses understanding.   The above documentation has been reviewed and is accurate and complete Artist Lloyd, M.D.

## 2023-05-20 ENCOUNTER — Other Ambulatory Visit: Payer: Self-pay

## 2023-05-20 ENCOUNTER — Ambulatory Visit (INDEPENDENT_AMBULATORY_CARE_PROVIDER_SITE_OTHER): Payer: BC Managed Care – PPO | Admitting: Family Medicine

## 2023-05-20 VITALS — BP 112/78 | HR 70 | Ht 65.0 in | Wt 196.0 lb

## 2023-05-20 DIAGNOSIS — M7918 Myalgia, other site: Secondary | ICD-10-CM

## 2023-05-20 DIAGNOSIS — G8929 Other chronic pain: Secondary | ICD-10-CM

## 2023-05-20 DIAGNOSIS — M533 Sacrococcygeal disorders, not elsewhere classified: Secondary | ICD-10-CM

## 2023-05-20 NOTE — Patient Instructions (Addendum)
 Thank you for coming in today.   You received an injection today. Seek immediate medical attention if the joint becomes red, extremely painful, or is oozing fluid.  Schedule with me in Feb prior to your insurance changes for possible SI joint injections

## 2023-05-24 DIAGNOSIS — M546 Pain in thoracic spine: Secondary | ICD-10-CM | POA: Diagnosis not present

## 2023-05-28 DIAGNOSIS — D225 Melanocytic nevi of trunk: Secondary | ICD-10-CM | POA: Diagnosis not present

## 2023-05-28 DIAGNOSIS — D2272 Melanocytic nevi of left lower limb, including hip: Secondary | ICD-10-CM | POA: Diagnosis not present

## 2023-05-28 DIAGNOSIS — D2262 Melanocytic nevi of left upper limb, including shoulder: Secondary | ICD-10-CM | POA: Diagnosis not present

## 2023-05-28 DIAGNOSIS — D2261 Melanocytic nevi of right upper limb, including shoulder: Secondary | ICD-10-CM | POA: Diagnosis not present

## 2023-06-08 ENCOUNTER — Encounter (INDEPENDENT_AMBULATORY_CARE_PROVIDER_SITE_OTHER): Payer: Self-pay | Admitting: Family Medicine

## 2023-06-08 ENCOUNTER — Ambulatory Visit (INDEPENDENT_AMBULATORY_CARE_PROVIDER_SITE_OTHER): Payer: BC Managed Care – PPO | Admitting: Family Medicine

## 2023-06-08 VITALS — BP 115/75 | HR 73 | Temp 97.9°F | Ht 65.0 in | Wt 188.0 lb

## 2023-06-08 DIAGNOSIS — H40033 Anatomical narrow angle, bilateral: Secondary | ICD-10-CM | POA: Diagnosis not present

## 2023-06-08 DIAGNOSIS — E669 Obesity, unspecified: Secondary | ICD-10-CM

## 2023-06-08 DIAGNOSIS — E1169 Type 2 diabetes mellitus with other specified complication: Secondary | ICD-10-CM

## 2023-06-08 DIAGNOSIS — Z7985 Long-term (current) use of injectable non-insulin antidiabetic drugs: Secondary | ICD-10-CM

## 2023-06-08 DIAGNOSIS — Z6831 Body mass index (BMI) 31.0-31.9, adult: Secondary | ICD-10-CM | POA: Diagnosis not present

## 2023-06-08 DIAGNOSIS — R252 Cramp and spasm: Secondary | ICD-10-CM

## 2023-06-08 MED ORDER — OZEMPIC (2 MG/DOSE) 8 MG/3ML ~~LOC~~ SOPN: 2.0000 mg | PEN_INJECTOR | SUBCUTANEOUS | 0 refills | Status: DC

## 2023-06-08 NOTE — Progress Notes (Signed)
Patty Williams, D.O.  ABFM, ABOM Specializing in Clinical Bariatric Medicine  Office located at: [address] [place], [place]  [address]   Assessment and Plan:   FOR THE DISEASE OF OBESITY: BMI 31.0-31.9,adult - current BMI 31.28 Obesity, Beginning BMI 38.11 Assessment & Plan: Since last office visit on 05/18/2023 patient's  Muscle mass has decreased by 1.8 lb. Fat mass has increased by 1.8 lb. Total body water has increased by 3.6 lb.  Counseling done on how various foods will affect these numbers and how to maximize success  Total lbs lost to date: 41 lbs  Total weight loss percentage to date: 17.90%    Recommended Dietary Goals Patty Williams is currently in the action stage of change. As such, her goal is to continue weight management plan.  She has agreed to: continue current plan   Behavioral Intervention We discussed the following today: eating out strategies, tracking and journaling calories, different sources of lean protein, ChatGPT for meal planning and recipes, making time for self-care, relaxation, and stress management.   Additional resources provided today:  handout on healthy eating principles  Evidence-based interventions for health behavior change were utilized today including the discussion of self monitoring techniques, problem-solving barriers and SMART goal setting techniques.   Regarding patient's less desirable eating habits and patterns, we employed the technique of small changes.   Pt will specifically work on: n/a   Recommended Physical Activity Goals Patty Williams has been advised to work up to 150 minutes of moderate intensity aerobic activity a week and strengthening exercises 2-3 times per week for cardiovascular health, weight loss maintenance and preservation of muscle mass.   She has agreed to : Continue current level of physical activity    Pharmacotherapy We both agreed to : continue with nutritional and behavioral strategies and continue Ozempic  at current dose.   FOR ASSOCIATED CONDITIONS ADDRESSED TODAY:  Type 2 diabetes mellitus with other specified complication, unspecified whether long term insulin use (HCC) Assessment & Plan: T2DM managed well with Ozmepic 2 mg weekly. Pt tolerating GLP-1 therapy well - she denies gastrointestinal issues. Denies hyperglycemia or hypoglycemia. Hunger/cravings are stable. Continue current medication and balanced diet focusing on protein, fruits, and vegetables while limiting simple carbohydrates. Losing 10% or more of body weight may improve condition.   Orders: -     Ozempic (2 MG/DOSE); Inject 2 mg into the skin once a week.  Dispense: 3 mL; Refill: 0   Muscle cramping Assessment & Plan: Pt endorses having muscle cramps in her calves and feet at night.  She has explored going off her Crestor for a short period but this did not improve symptoms. She also has been taking Coq10 100 mg daily and Magnesium 240 mg daily which have also not made any difference to her cramping. The only thing she not tried is increasing her water intake.   Increase Coq10 to 200 mg daily. Continue Crestor and Magnesium.  Increase water intake to at least half of your weight in ounces of water per day. Will continue to monitor condition.    Follow up:   Return 07/08/23. She was informed of the importance of frequent follow up visits to maximize her success with intensive lifestyle modifications for her multiple health conditions.  Subjective:   Chief complaint: Obesity Patty Williams is here to discuss her progress with her obesity treatment plan. She is on the Category 2 Plan + Journaling and states she is following her eating plan approximately 60% of the  time. She states she is walking + PT 20 minutes 3-5  days per week.  Interval History:  Patty Williams is here for a follow up office visit. Since last OV on 05/18/2023, Patty Williams's weight has not changed. She endorses having difficulty logging her foods when eating out. She  has no complaints of hunger/cravings today.   Pharmacotherapy for weight loss: She is currently taking  Ozempic 2 mg weekly .   Review of Systems:  Pertinent positives were addressed with patient today.  Reviewed by clinician on day of visit: allergies, medications, problem list, medical history, surgical history, family history, social history, and previous encounter notes.  Weight Summary and Biometrics   Weight Lost Since Last Visit: 0  Weight Gained Since Last Visit: 0   Vitals Temp: 97.9 F (36.6 C) BP: 115/75 Pulse Rate: 73 SpO2: 96 %   Anthropometric Measurements Height: 5\' 5"  (1.651 m) Weight: 188 lb (85.3 kg) BMI (Calculated): 31.28 Weight at Last Visit: 188 lb Weight Lost Since Last Visit: 0 Weight Gained Since Last Visit: 0 Starting Weight: 229 lb Total Weight Loss (lbs): 41 lb (18.6 kg)   Body Composition  Body Fat %: 41.2 % Fat Mass (lbs): 77.4 lbs Muscle Mass (lbs): 105 lbs Total Body Water (lbs): 75.2 lbs Visceral Fat Rating : 11   No data recorded  Objective:   PHYSICAL EXAM: Blood pressure 115/75, pulse 73, temperature 97.9 F (36.6 C), height 5\' 5"  (1.651 m), weight 188 lb (85.3 kg), last menstrual period 10/08/2018, SpO2 96%. Body mass index is 31.28 kg/m.  General: she is overweight, cooperative and in no acute distress. PSYCH: Has normal mood, affect and thought process.   HEENT: EOMI, sclerae are anicteric. Lungs: Normal breathing effort, no conversational dyspnea. Extremities: Moves * 4 Neurologic: A and O * 3, good insight  DIAGNOSTIC DATA REVIEWED: BMET    Component Value Date/Time   NA 144 03/05/2023 0824   NA 142 02/11/2023 1555   K 4.4 03/05/2023 0824   CL 104 03/05/2023 0824   CO2 32 03/05/2023 0824   GLUCOSE 98 03/05/2023 0824   BUN 19 03/05/2023 0824   BUN 22 02/11/2023 1555   CREATININE 0.64 03/05/2023 0824   CALCIUM 9.4 03/05/2023 0824   GFRNONAA 98 06/27/2020 0840   GFRAA 113 06/27/2020 0840   Lab Results   Component Value Date   HGBA1C 5.9 03/05/2023   HGBA1C 6.5 (H) 03/02/2008   Lab Results  Component Value Date   INSULIN 10.3 07/02/2022   INSULIN 19.9 06/27/2020   Lab Results  Component Value Date   TSH 2.44 03/05/2023   CBC    Component Value Date/Time   WBC 6.3 03/05/2023 0824   RBC 4.33 03/05/2023 0824   HGB 13.0 03/05/2023 0824   HGB 13.0 06/27/2020 0840   HCT 40.3 03/05/2023 0824   HCT 39.0 06/27/2020 0840   PLT 238.0 03/05/2023 0824   PLT 268 06/27/2020 0840   MCV 93.3 03/05/2023 0824   MCV 87 06/27/2020 0840   MCH 28.9 06/27/2020 0840   MCH 28.9 09/23/2018 0404   MCHC 32.1 03/05/2023 0824   RDW 14.2 03/05/2023 0824   RDW 14.3 06/27/2020 0840   Iron Studies    Component Value Date/Time   IRON 62 11/09/2008 1037   FERRITIN 3.9 (L) 11/09/2008 1037   IRONPCTSAT 11.3 (L) 11/09/2008 1037   Lipid Panel     Component Value Date/Time   CHOL 201 (H) 03/05/2023 0824   CHOL 161 07/02/2022 1058  TRIG 92.0 03/05/2023 0824   HDL 55.70 03/05/2023 0824   HDL 49 07/02/2022 1058   CHOLHDL 4 03/05/2023 0824   VLDL 18.4 03/05/2023 0824   LDLCALC 127 (H) 03/05/2023 0824   LDLCALC 99 07/02/2022 1058   LDLDIRECT 144.2 09/21/2008 1008   Hepatic Function Panel     Component Value Date/Time   PROT 6.9 03/05/2023 0824   PROT 6.8 02/11/2023 1555   ALBUMIN 4.2 03/05/2023 0824   ALBUMIN 4.2 02/11/2023 1555   AST 20 03/05/2023 0824   ALT 23 03/05/2023 0824   ALKPHOS 76 03/05/2023 0824   BILITOT 0.6 03/05/2023 0824   BILITOT 0.3 02/11/2023 1555   BILIDIR 0.1 03/02/2008 1018      Component Value Date/Time   TSH 2.44 03/05/2023 0824   Nutritional Lab Results  Component Value Date   VD25OH 34.66 03/05/2023   VD25OH 63.3 11/02/2022   VD25OH 73.9 07/02/2022    Attestations:   I, Special Puri, acting as a Stage manager for [employer], DO., have compiled all relevant documentation for today's office visit on behalf of Patty Lot, DO, while in the presence  of [employer], DO.  I have reviewed the above documentation for accuracy and completeness, and I agree with the above. Patty Williams, D.O.  The 21st Century Cures Act was signed into law in 2016 which includes the topic of electronic health records.  This provides immediate access to information in MyChart.  This includes consultation notes, operative notes, office notes, lab results and pathology reports.  If you have any questions about what you read please let us know at your next visit so we can discuss your concerns and take corrective action if need be.  We are right here with you.

## 2023-06-10 DIAGNOSIS — M216X2 Other acquired deformities of left foot: Secondary | ICD-10-CM | POA: Diagnosis not present

## 2023-06-10 DIAGNOSIS — M2042 Other hammer toe(s) (acquired), left foot: Secondary | ICD-10-CM | POA: Diagnosis not present

## 2023-06-10 DIAGNOSIS — R252 Cramp and spasm: Secondary | ICD-10-CM | POA: Diagnosis not present

## 2023-06-10 DIAGNOSIS — M216X1 Other acquired deformities of right foot: Secondary | ICD-10-CM | POA: Diagnosis not present

## 2023-06-11 DIAGNOSIS — M546 Pain in thoracic spine: Secondary | ICD-10-CM | POA: Diagnosis not present

## 2023-06-14 ENCOUNTER — Ambulatory Visit: Payer: BC Managed Care – PPO | Admitting: Family Medicine

## 2023-06-14 ENCOUNTER — Other Ambulatory Visit: Payer: Self-pay

## 2023-06-14 ENCOUNTER — Ambulatory Visit (INDEPENDENT_AMBULATORY_CARE_PROVIDER_SITE_OTHER): Payer: BC Managed Care – PPO | Admitting: Family Medicine

## 2023-06-14 VITALS — BP 124/82 | HR 76 | Ht 65.0 in | Wt 194.0 lb

## 2023-06-14 DIAGNOSIS — G4733 Obstructive sleep apnea (adult) (pediatric): Secondary | ICD-10-CM | POA: Diagnosis not present

## 2023-06-14 DIAGNOSIS — M7918 Myalgia, other site: Secondary | ICD-10-CM | POA: Diagnosis not present

## 2023-06-14 DIAGNOSIS — M533 Sacrococcygeal disorders, not elsewhere classified: Secondary | ICD-10-CM

## 2023-06-14 DIAGNOSIS — G8929 Other chronic pain: Secondary | ICD-10-CM

## 2023-06-14 NOTE — Patient Instructions (Addendum)
Thank you for coming in today.   You received an injection today. Seek immediate medical attention if the joint becomes red, extremely painful, or is oozing fluid.   Return as needed.

## 2023-06-14 NOTE — Progress Notes (Signed)
Rubin Payor, PhD, LAT, ATC acting as a scribe for Clementeen Graham, MD.  Patty Williams is a 63 y.o. female who presents to [facility] at [facility] today for cont'd SI joint pain. Pt was last seen by Dr. Denyse Amass on 05/20/23 and was given a L rhomboid trigger point injection. Last R SI joint steroid injection was on 02/16/23.  Today, pt reports much relief from prior trigger point injection, 80% improvement. R SI pain is not completely resolved and she is wondering if another injection would complete resolve her pain. She is also wondering if there is any other trigger points that could be targeted today.   Dx testing: 04/11/23 C-spine MRI 02/26/23 C-spine, T-spine, L-spine, & sacrum/coccyx XR 02/16/23 Labs 02/11/23 Labs  Pertinent review of systems: No fevers or chills  Relevant historical information: Hypertension and diabetes.   Exam:  BP 124/82   Pulse 76   Ht 5\' 5"  (1.651 m)   Wt 194 lb (88 kg)   LMP 10/08/2018 (Exact Date)   SpO2 96%   BMI 32.28 kg/m  General: Well Developed, well nourished, and in no acute distress.   MSK: Trigger points: Patient has palpable trigger points along the inferior rhomboids bilaterally.    Lab and Radiology Results  Procedure: Real-time Ultrasound Guided Injection of right inferior rhomboid trigger point Device: Philips Affiniti 50G/GE Logiq Images permanently stored and available for review in PACS Verbal informed consent obtained.  Discussed risks and benefits of procedure. Warned about infection, bleeding, hyperglycemia damage to structures among others. Patient expresses understanding and agreement Time-out conducted.   Noted no overlying erythema, induration, or other signs of local infection.   Skin prepped in a sterile fashion.   Local anesthesia: Topical Ethyl chloride.   With sterile technique and under real time ultrasound guidance: 40 mg of Kenalog and 2 mL of Marcaine injected into trigger point. Fluid seen  entering the trigger point.   Completed without difficulty   Pain immediately resolved suggesting accurate placement of the medication.   Advised to call if fevers/chills, erythema, induration, drainage, or persistent bleeding.   Images permanently stored and available for review in the ultrasound unit.  Impression: Technically successful ultrasound guided injection.    Procedure: Real-time Ultrasound Guided Injection of left inferior rhomboid trigger point Device: Philips Affiniti 50G/GE Logiq Images permanently stored and available for review in PACS Verbal informed consent obtained.  Discussed risks and benefits of procedure. Warned about infection, bleeding, hyperglycemia damage to structures among others. Patient expresses understanding and agreement Time-out conducted.   Noted no overlying erythema, induration, or other signs of local infection.   Skin prepped in a sterile fashion.   Local anesthesia: Topical Ethyl chloride.   With sterile technique and under real time ultrasound guidance: 40 mg of Kenalog and 2 mL of Marcaine injected into left inferior rhomboid trigger point. Fluid seen entering the trigger point.   Completed without difficulty   Pain immediately resolved suggesting accurate placement of the medication.   Advised to call if fevers/chills, erythema, induration, drainage, or persistent bleeding.   Images permanently stored and available for review in the ultrasound unit.  Impression: Technically successful ultrasound guided injection.       Assessment and Plan: 63 y.o. female with bilateral upper back pain chronic ongoing issue failing to improve with PT.  She had good benefit from trigger point injections superior rhomboids few weeks ago.  Plan for inferior rhomboid trigger point injections today.  Recheck in a  month consider SI joint injections at that time.   PDMP not reviewed this encounter. Orders Placed This Encounter  Procedures   [place] LIMITED JOINT  SPACE STRUCTURES LOW RIGHT(NO LINKED CHARGES)    Reason for Exam (SYMPTOM  OR DIAGNOSIS REQUIRED):   right SI joint pain    Preferred imaging location?:   [facility]   No orders of the defined types were placed in this encounter.    Discussed warning signs or symptoms. Please see discharge instructions. Patient expresses understanding.   The above documentation has been reviewed and is accurate and complete Clementeen Graham, M.D.

## 2023-06-17 ENCOUNTER — Encounter: Payer: Self-pay | Admitting: Internal Medicine

## 2023-06-18 ENCOUNTER — Telehealth: Payer: Self-pay | Admitting: Internal Medicine

## 2023-06-18 DIAGNOSIS — J849 Interstitial pulmonary disease, unspecified: Secondary | ICD-10-CM

## 2023-06-18 DIAGNOSIS — I517 Cardiomegaly: Secondary | ICD-10-CM

## 2023-06-18 NOTE — Telephone Encounter (Signed)
 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 Plan- ordering HRCT chest ILD protocol

## 2023-06-21 ENCOUNTER — Telehealth: Payer: Self-pay | Admitting: Internal Medicine

## 2023-06-21 ENCOUNTER — Other Ambulatory Visit: Payer: Self-pay | Admitting: Internal Medicine

## 2023-06-21 NOTE — Telephone Encounter (Signed)
 PT needs her CT sched before March 3rd because she is losing her insurance. Pls call [phone]  Can go to [facility]  [address] [place]

## 2023-06-21 NOTE — Telephone Encounter (Signed)
Pt has been scheduled and contacted.

## 2023-06-21 NOTE — Telephone Encounter (Signed)
 Patty Williams states CT scan scheduled 06/24/2023 needs to be prior authorized. [place] phone number is [phone].

## 2023-06-24 ENCOUNTER — Ambulatory Visit
Admission: RE | Admit: 2023-06-24 | Discharge: 2023-06-24 | Discharge status: Home / Self Care | Payer: BC Managed Care – PPO | Source: Ambulatory Visit | Attending: Internal Medicine | Admitting: Internal Medicine

## 2023-06-24 DIAGNOSIS — J849 Interstitial pulmonary disease, unspecified: Secondary | ICD-10-CM | POA: Diagnosis not present

## 2023-06-25 DIAGNOSIS — M546 Pain in thoracic spine: Secondary | ICD-10-CM | POA: Diagnosis not present

## 2023-07-01 ENCOUNTER — Other Ambulatory Visit: Payer: Self-pay

## 2023-07-01 ENCOUNTER — Encounter: Payer: Self-pay | Admitting: Family Medicine

## 2023-07-01 ENCOUNTER — Ambulatory Visit (INDEPENDENT_AMBULATORY_CARE_PROVIDER_SITE_OTHER): Payer: BC Managed Care – PPO | Admitting: Family Medicine

## 2023-07-01 VITALS — BP 124/84 | HR 68 | Ht 65.0 in | Wt 194.0 lb

## 2023-07-01 DIAGNOSIS — G8929 Other chronic pain: Secondary | ICD-10-CM | POA: Diagnosis not present

## 2023-07-01 DIAGNOSIS — M533 Sacrococcygeal disorders, not elsewhere classified: Secondary | ICD-10-CM | POA: Diagnosis not present

## 2023-07-01 DIAGNOSIS — M359 Systemic involvement of connective tissue, unspecified: Secondary | ICD-10-CM | POA: Diagnosis not present

## 2023-07-01 DIAGNOSIS — M791 Myalgia, unspecified site: Secondary | ICD-10-CM | POA: Diagnosis not present

## 2023-07-01 DIAGNOSIS — M2550 Pain in unspecified joint: Secondary | ICD-10-CM | POA: Diagnosis not present

## 2023-07-01 DIAGNOSIS — R768 Other specified abnormal immunological findings in serum: Secondary | ICD-10-CM | POA: Diagnosis not present

## 2023-07-01 NOTE — Progress Notes (Signed)
   I, Stevenson Clinch, CMA acting as a scribe for Clementeen Graham, MD.  Patty Williams is a 63 y.o. female who presents to [facility] at [facility] today for f/u R SI joint pain. Pt was last seen by Dr. Denyse Amass on 06/14/23 and was bilat rhomboid trigger point injections. Last R SI joint steroid injection was on 02/16/23. Pt has cont'd PT, completing 7 visits.  Today, pt reports significant relief of lower back sx s/p SI joint injection. Over the past couple of weeks, sx have started to occur more frequently. Some pain radiating into the glute but not into the leg.   Dx testing: 04/11/23 C-spine MRI 02/26/23 C-spine, T-spine, L-spine, & sacrum/coccyx XR 02/16/23 Labs 02/11/23 Labs  Pertinent review of systems: No fevers or chills  Relevant historical information: Hypertension and diabetes   Exam:  BP 124/84   Pulse 68   Ht 5\' 5"  (1.651 m)   Wt 194 lb (88 kg)   LMP 10/08/2018 (Exact Date)   SpO2 94%   BMI 32.28 kg/m  General: Well Developed, well nourished, and in no acute distress.   MSK: Right SI joint tender to palpation.  Normal lumbar motion.    Lab and Radiology Results  Procedure: Real-time Ultrasound Guided Injection of right SI joint Device: Philips Affiniti 50G/GE Logiq Images permanently stored and available for review in PACS Verbal informed consent obtained.  Discussed risks and benefits of procedure. Warned about infection, bleeding, hyperglycemia damage to structures among others. Patient expresses understanding and agreement Time-out conducted.   Noted no overlying erythema, induration, or other signs of local infection.   Skin prepped in a sterile fashion.   Local anesthesia: Topical Ethyl chloride.   With sterile technique and under real time ultrasound guidance: 40 mg of Kenalog and 2 mL of Marcaine injected into SI joint. Fluid seen entering the joint capsule.   Completed without difficulty   Pain immediately resolved suggesting accurate placement  of the medication.   Advised to call if fevers/chills, erythema, induration, drainage, or persistent bleeding.   Images permanently stored and available for review in the ultrasound unit.  Impression: Technically successful ultrasound guided injection.        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  Previous injection was about 4 months ago.  Proceed with injection today continue home exercise program taught by PT.  Check back as needed for this or other issues.   PDMP not reviewed this encounter. Orders Placed This Encounter  Procedures   [place] LIMITED JOINT SPACE STRUCTURES LOW RIGHT(NO LINKED CHARGES)    Reason for Exam (SYMPTOM  OR DIAGNOSIS REQUIRED):   right hip/low back pain    Preferred imaging location?:   [facility]   No orders of the defined types were placed in this encounter.    Discussed warning signs or symptoms. Please see discharge instructions. Patient expresses understanding.   The above documentation has been reviewed and is accurate and complete Clementeen Graham, M.D.

## 2023-07-01 NOTE — Patient Instructions (Signed)
 Thank you for coming in today.   We can do more if needed.   Let me know.

## 2023-07-02 DIAGNOSIS — M546 Pain in thoracic spine: Secondary | ICD-10-CM | POA: Diagnosis not present

## 2023-07-06 ENCOUNTER — Telehealth: Payer: Self-pay | Admitting: Internal Medicine

## 2023-07-06 NOTE — Telephone Encounter (Signed)
 Patient has requested a sooner appointment with Dr.Young and needs the results of her CT scan to be requested from [facility] before Thursday February 27th at 11:00am which is her next appointment. Please call [facility] for CT results at [phone]

## 2023-07-07 NOTE — Progress Notes (Unsigned)
 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 -Symbicort 80,Ventolin hfa,  Body weight today-  195 lbs CPAP auto 5-15/ [facility]                AirSense 10 AutoSet   Download- compliance since 01/16/22-10/12/22- 98-100%, AHI 1-5-1.9/ hr Download reviewed.  She is comfortable with CPAP and maintains excellent compliance and control.  She indicates she is sleeping well with CPAP and denies daytime tiredness. History of mild intermittent asthma has been clinically insignificant.  She continues routine use of her Symbicort with very rare need for her albuterol rescue inhaler. Triggers have mostly been limited to exposure to strong smells such as air freshener. Prognosis for both problems remains excellent. She needs updated documentation for FAA.  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 -Symbicort 80,Ventolin hfa,  Body weight today-   CPAP auto 5-15/ [facility]                AirSense 10 AutoSet   Download compliance-  HR CT chest 06/24/23 IMPRESSION: 1. Very mild peripheral and basilar intralobular and interlobular lines with ground-glass, concerning for early/mild interstitial lung disease. Findings are indeterminate for UIP per consensus guidelines: Diagnosis of Idiopathic  Pulmonary Fibrosis: An Official ATS/ERS/JRS/ALAT Clinical Practice Guideline. Am Patty Williams Crit Care Med Vol 198, Iss 5, [phone], Jan 09 2017. 2. Air trapping is indicative of small airways disease. 3. Left anterior descending coronary artery calcification.   ROS-see HPI   + = positive Constitutional:    weight loss, night sweats, fevers, chills, fatigue, lassitude. HEENT:    headaches, difficulty swallowing, tooth/dental problems, sore throat,       sneezing, itching, ear ache, nasal congestion, post nasal drip, snoring CV:    chest pain, orthopnea, PND, swelling in lower extremities, anasarca,                                   dizziness, palpitations Resp:   +shortness of breath with exertion or at rest.                productive cough,   non-productive cough, coughing up of blood.              change in color of mucus.  wheezing.   Skin:    rash or lesions. GI:    +heartburn, indigestion, abdominal pain, nausea, vomiting, diarrhea,                 change in bowel habits, loss of appetite GU: dysuria, change in color of urine, no urgency or frequency,  flank pain. MS:   joint pain, stiffness, decreased range of motion, back pain. Neuro-     nothing unusual Psych:  change in mood or affect.  depression or  anxiety.   memory loss.  OBJ- Physical Exam General- Alert, Oriented, Affect-appropriate, Distress- none acute, + overweight Skin- rash-none, lesions- none, excoriation- none Lymphadenopathy- none Head- atraumatic            Eyes- Gross vision intact, PERRLA, conjunctivae and secretions clear            Ears- Hearing, canals-normal            Nose- Clear, no-Septal dev, mucus, polyps, erosion, perforation             Throat- Mallampati II-III , mucosa clear , drainage- none, tonsils- atrophic Neck- flexible , trachea midline, no stridor , thyroid nl, carotid no bruit Chest - symmetrical excursion , unlabored           Heart/CV- RRR , no murmur , no gallop  , no rub, nl s1 s2                            - JVD- none , edema- none, stasis changes- none, varices- none           Lung- clear to P&A- no crackles, wheeze- none, cough- none , dullness-none, rub- none           Chest wall-  Abd-  Br/ Gen/ Rectal- Not done, not indicated Extrem- cyanosis- none, clubbing, none, atrophy- none, strength- nl Neuro- grossly intact to observation

## 2023-07-07 NOTE — Telephone Encounter (Signed)
 Spoke to Diane with [place] [facility] and requested CT to be read prior to 2/27 appt.  Nothing further needed.

## 2023-07-08 ENCOUNTER — Encounter (INDEPENDENT_AMBULATORY_CARE_PROVIDER_SITE_OTHER): Payer: Self-pay | Admitting: Family Medicine

## 2023-07-08 ENCOUNTER — Ambulatory Visit (INDEPENDENT_AMBULATORY_CARE_PROVIDER_SITE_OTHER): Payer: BC Managed Care – PPO | Admitting: Internal Medicine

## 2023-07-08 ENCOUNTER — Ambulatory Visit (INDEPENDENT_AMBULATORY_CARE_PROVIDER_SITE_OTHER): Payer: BC Managed Care – PPO | Admitting: Family Medicine

## 2023-07-08 ENCOUNTER — Encounter: Payer: Self-pay | Admitting: Internal Medicine

## 2023-07-08 VITALS — BP 108/64 | HR 61 | Temp 97.9°F | Ht 65.0 in | Wt 194.6 lb

## 2023-07-08 VITALS — BP 116/75 | HR 66 | Temp 98.1°F | Ht 65.0 in | Wt 189.0 lb

## 2023-07-08 DIAGNOSIS — G4733 Obstructive sleep apnea (adult) (pediatric): Secondary | ICD-10-CM | POA: Diagnosis not present

## 2023-07-08 DIAGNOSIS — I10 Essential (primary) hypertension: Secondary | ICD-10-CM

## 2023-07-08 DIAGNOSIS — Z7985 Long-term (current) use of injectable non-insulin antidiabetic drugs: Secondary | ICD-10-CM

## 2023-07-08 DIAGNOSIS — Z6831 Body mass index (BMI) 31.0-31.9, adult: Secondary | ICD-10-CM

## 2023-07-08 DIAGNOSIS — E119 Type 2 diabetes mellitus without complications: Secondary | ICD-10-CM | POA: Diagnosis not present

## 2023-07-08 DIAGNOSIS — E785 Hyperlipidemia, unspecified: Secondary | ICD-10-CM | POA: Diagnosis not present

## 2023-07-08 DIAGNOSIS — E1169 Type 2 diabetes mellitus with other specified complication: Secondary | ICD-10-CM | POA: Diagnosis not present

## 2023-07-08 DIAGNOSIS — K760 Fatty (change of) liver, not elsewhere classified: Secondary | ICD-10-CM | POA: Diagnosis not present

## 2023-07-08 DIAGNOSIS — E66812 Obesity, class 2: Secondary | ICD-10-CM

## 2023-07-08 DIAGNOSIS — R252 Cramp and spasm: Secondary | ICD-10-CM

## 2023-07-08 DIAGNOSIS — R7401 Elevation of levels of liver transaminase levels: Secondary | ICD-10-CM | POA: Diagnosis not present

## 2023-07-08 DIAGNOSIS — E669 Obesity, unspecified: Secondary | ICD-10-CM

## 2023-07-08 DIAGNOSIS — E559 Vitamin D deficiency, unspecified: Secondary | ICD-10-CM | POA: Diagnosis not present

## 2023-07-08 MED ORDER — OZEMPIC (2 MG/DOSE) 8 MG/3ML ~~LOC~~ SOPN: 2.0000 mg | PEN_INJECTOR | SUBCUTANEOUS | 0 refills | Status: DC

## 2023-07-08 NOTE — Patient Instructions (Addendum)
 You are doing great with CPAP. We can continue auto 5-15.  Your mild intermittent uncomplicated asthma is doing well. We can continue current meds.  Order - referral to Dr Marchelle Gearing consult for new dx ILD  Ok to cancel the March appointment and keep the June 9 appointment with me.

## 2023-07-08 NOTE — Progress Notes (Signed)
 .smr  Office: [phone]  /  Fax: [phone]  WEIGHT SUMMARY AND BIOMETRICS  Anthropometric Measurements Height: 5\' 5"  (1.651 m) Weight: 189 lb (85.7 kg) BMI (Calculated): 31.45 Weight at Last Visit: 188 lb Weight Lost Since Last Visit: 0 Weight Gained Since Last Visit: 1 lb Starting Weight: 229 lb Total Weight Loss (lbs): 40 lb (18.1 kg) Peak Weight: 232 lb   Body Composition  Body Fat %: 42 % Fat Mass (lbs): 79.6 lbs Muscle Mass (lbs): 104.4 lbs Total Body Water (lbs): 73.8 lbs Visceral Fat Rating : 11   Other Clinical Data Fasting: no Labs: no Today's Visit #: 39 Starting Date: 06/27/20    Chief Complaint: OBESITY   History of Present Illness   Patty Williams is a 63 year old female with obesity, type 2 diabetes, hyperlipidemia, and hypertension who presents for obesity treatment plan discussion and progress monitoring.  She is adhering to her category two obesity treatment plan about fifty percent of the time and has gained one pound in the last month. She engages in physical therapy and walks for twenty minutes four times per week. She is fasting for labs today.  She has type 2 diabetes and is currently on Ozempic. She is working on improving her diet, exercise, and weight loss to manage her diabetes. She is due for lab work.  She has hyperlipidemia and is on Crestor. She is focusing on diet, exercise, and weight loss to manage this condition.  She has hypertension, which is well controlled.  She experiences muscle cramping, primarily in her calves, the bottom of her feet, and one part of her back, mostly in the evenings and mornings. She has seen a rheumatologist who found no issues. She stopped taking Crestor without improvement. She drinks 80-90 ounces of fluids daily, but the cramps persist. She is taking magnesium and CoQ10, having recently increased her CoQ10 dose to 300 mg. She also has a history of restless leg syndrome according to a sleep  study.  She has a history of vitamin D deficiency and is taking over-the-counter vitamin D at 4000 IU per day.          PHYSICAL EXAM:  Blood pressure 116/75, pulse 66, temperature 98.1 F (36.7 C), height 5\' 5"  (1.651 m), weight 189 lb (85.7 kg), last menstrual period 10/08/2018, SpO2 98%. Body mass index is 31.45 kg/m.  DIAGNOSTIC DATA REVIEWED:  BMET    Component Value Date/Time   NA 144 03/05/2023 0824   NA 142 02/11/2023 1555   K 4.4 03/05/2023 0824   CL 104 03/05/2023 0824   CO2 32 03/05/2023 0824   GLUCOSE 98 03/05/2023 0824   BUN 19 03/05/2023 0824   BUN 22 02/11/2023 1555   CREATININE 0.64 03/05/2023 0824   CALCIUM 9.4 03/05/2023 0824   GFRNONAA 98 06/27/2020 0840   GFRAA 113 06/27/2020 0840   Lab Results  Component Value Date   HGBA1C 5.9 03/05/2023   HGBA1C 6.5 (H) 03/02/2008   Lab Results  Component Value Date   INSULIN 10.3 07/02/2022   INSULIN 19.9 06/27/2020   Lab Results  Component Value Date   TSH 2.44 03/05/2023   CBC    Component Value Date/Time   WBC 6.3 03/05/2023 0824   RBC 4.33 03/05/2023 0824   HGB 13.0 03/05/2023 0824   HGB 13.0 06/27/2020 0840   HCT 40.3 03/05/2023 0824   HCT 39.0 06/27/2020 0840   PLT 238.0 03/05/2023 0824   PLT 268 06/27/2020 0840   MCV 93.3  03/05/2023 0824   MCV 87 06/27/2020 0840   MCH 28.9 06/27/2020 0840   MCH 28.9 09/23/2018 0404   MCHC 32.1 03/05/2023 0824   RDW 14.2 03/05/2023 0824   RDW 14.3 06/27/2020 0840   Iron Studies    Component Value Date/Time   IRON 62 11/09/2008 1037   FERRITIN 3.9 (L) 11/09/2008 1037   IRONPCTSAT 11.3 (L) 11/09/2008 1037   Lipid Panel     Component Value Date/Time   CHOL 201 (H) 03/05/2023 0824   CHOL 161 07/02/2022 1058   TRIG 92.0 03/05/2023 0824   HDL 55.70 03/05/2023 0824   HDL 49 07/02/2022 1058   CHOLHDL 4 03/05/2023 0824   VLDL 18.4 03/05/2023 0824   LDLCALC 127 (H) 03/05/2023 0824   LDLCALC 99 07/02/2022 1058   LDLDIRECT 144.2 09/21/2008 1008    Hepatic Function Panel     Component Value Date/Time   PROT 6.9 03/05/2023 0824   PROT 6.8 02/11/2023 1555   ALBUMIN 4.2 03/05/2023 0824   ALBUMIN 4.2 02/11/2023 1555   AST 20 03/05/2023 0824   ALT 23 03/05/2023 0824   ALKPHOS 76 03/05/2023 0824   BILITOT 0.6 03/05/2023 0824   BILITOT 0.3 02/11/2023 1555   BILIDIR 0.1 03/02/2008 1018      Component Value Date/Time   TSH 2.44 03/05/2023 0824   Nutritional Lab Results  Component Value Date   VD25OH 34.66 03/05/2023   VD25OH 63.3 11/02/2022   VD25OH 73.9 07/02/2022     Assessment and Plan    Obesity Following category two plan about 50% of the time, gained 1 lb in the last month. Engaging in physical therapy and walking 20 minutes four times per week. Discussed challenges with making healthy food choices, especially when eating out or during social events. Provided education on better food choices and strategies for eating out. Discussed the importance of protein intake and strategies to avoid high-calorie ingredients. - Continue category two plan - Continue physical therapy and walking - Provide updated eating out handout - Discuss strategies for making healthier food choices during social events  Type 2 Diabetes Mellitus Currently on Ozempic. Due for lab work. Discussed the importance of diet, exercise, and weight loss in managing diabetes. Patient expressed a goal to eventually discontinue Ozempic with improved weight and control. - Order labs for diabetes monitoring - Continue Ozempic - Monitor blood glucose levels  Hyperlipidemia Currently on Crestor. Working on diet, exercise, and weight loss. Due for lab work. Stopped Crestor previously without improvement in muscle cramps. - Order labs for lipid profile - Continue Crestor - Continue diet and exercise regimen  Hypertension Well-controlled with a blood pressure of 116/75. Due for lab work. - Order labs for hypertension monitoring - Continue current  antihypertensive regimen  Muscle Cramping Reports muscle cramping, primarily in the calves and bottom of the feet, mostly in the evenings and mornings. Rheumatologist evaluation was normal. Stopped Crestor without improvement. Increased fluid intake and magnesium supplementation. Taking CoQ10 (300 mg). Discussed potential causes including electrolyte imbalances and anemia. - Order labs to check electrolytes, magnesium, phosphorus, folic acid, and anemia-related parameters - Continue CoQ10 (300 mg) - Continue magnesium supplementation  Vitamin D Deficiency Currently taking over-the-counter vitamin D (4000 IU daily). Due for lab work. - Order labs for vitamin D levels - Continue current vitamin D supplementation  General Health Maintenance Discussed general health maintenance strategies including diet, exercise, and weight management. Provided education on making healthier food choices and managing social eating situations. - Provide education  on healthier food choices - Discuss strategies for managing social eating situations  Follow-up - Follow up in four weeks - Set up next appointment - Call in [facility] prescription - Monitor insurance coverage for Ozempic.         I have personally spent 42 minutes total time today in preparation, patient care, and documentation for this visit, including the following: review of clinical lab tests; review of medical tests/procedures/services.    She was informed of the importance of frequent follow up visits to maximize her success with intensive lifestyle modifications for her multiple health conditions.    Quillian Quince, MD

## 2023-07-09 ENCOUNTER — Encounter: Payer: Self-pay | Admitting: Internal Medicine

## 2023-07-09 DIAGNOSIS — M546 Pain in thoracic spine: Secondary | ICD-10-CM | POA: Diagnosis not present

## 2023-07-09 LAB — IRON AND TIBC
Iron Saturation: 24 % (ref 15–55)
Iron: 92 ug/dL (ref 27–139)
Total Iron Binding Capacity: 382 ug/dL (ref 250–450)
UIBC: 290 ug/dL (ref 118–369)

## 2023-07-09 LAB — PHOSPHORUS: Phosphorus: 3.4 mg/dL (ref 3.0–4.3)

## 2023-07-09 LAB — VITAMIN B12: Vitamin B-12: 878 pg/mL (ref 232–1245)

## 2023-07-09 LAB — CMP14+EGFR
ALT: 35 IU/L — ABNORMAL HIGH (ref 0–32)
AST: 25 IU/L (ref 0–40)
Albumin: 4.2 g/dL (ref 3.9–4.9)
Alkaline Phosphatase: 69 IU/L (ref 44–121)
BUN/Creatinine Ratio: 25 (ref 12–28)
BUN: 16 mg/dL (ref 8–27)
Bilirubin Total: 0.5 mg/dL (ref 0.0–1.2)
CO2: 23 mmol/L (ref 20–29)
Calcium: 9.3 mg/dL (ref 8.7–10.3)
Chloride: 106 mmol/L (ref 96–106)
Creatinine, Ser: 0.63 mg/dL (ref 0.57–1.00)
Globulin, Total: 2.5 g/dL (ref 1.5–4.5)
Glucose: 95 mg/dL (ref 70–99)
Potassium: 4 mmol/L (ref 3.5–5.2)
Sodium: 146 mmol/L — ABNORMAL HIGH (ref 134–144)
Total Protein: 6.7 g/dL (ref 6.0–8.5)
eGFR: 100 mL/min/{1.73_m2} (ref >59)

## 2023-07-09 LAB — HEMOGLOBIN A1C
Est. average glucose Bld gHb Est-mCnc: 123 mg/dL
Hgb A1c MFr Bld: 5.9 % — ABNORMAL HIGH (ref 4.8–5.6)

## 2023-07-09 LAB — VITAMIN D 25 HYDROXY (VIT D DEFICIENCY, FRACTURES): Vit D, 25-Hydroxy: 67.2 ng/mL (ref 30.0–100.0)

## 2023-07-09 LAB — CBC WITH DIFFERENTIAL/PLATELET
Basophils Absolute: 0 10*3/uL (ref 0.0–0.2)
Basos: 0 %
EOS (ABSOLUTE): 0 10*3/uL (ref 0.0–0.4)
Eos: 1 %
Hematocrit: 40.1 % (ref 34.0–46.6)
Hemoglobin: 13.4 g/dL (ref 11.1–15.9)
Immature Grans (Abs): 0 10*3/uL (ref 0.0–0.1)
Immature Granulocytes: 0 %
Lymphocytes Absolute: 2.5 10*3/uL (ref 0.7–3.1)
Lymphs: 35 %
MCH: 31.2 pg (ref 26.6–33.0)
MCHC: 33.4 g/dL (ref 31.5–35.7)
MCV: 93 fL (ref 79–97)
Monocytes Absolute: 0.5 10*3/uL (ref 0.1–0.9)
Monocytes: 7 %
Neutrophils Absolute: 4.1 10*3/uL (ref 1.4–7.0)
Neutrophils: 57 %
Platelets: 233 10*3/uL (ref 150–450)
RBC: 4.3 x10E6/uL (ref 3.77–5.28)
RDW: 13.9 % (ref 11.7–15.4)
WBC: 7.2 10*3/uL (ref 3.4–10.8)

## 2023-07-09 LAB — LIPID PANEL WITH LDL/HDL RATIO
Cholesterol, Total: 166 mg/dL (ref 100–199)
HDL: 64 mg/dL (ref >39)
LDL Chol Calc (NIH): 92 mg/dL (ref 0–99)
LDL/HDL Ratio: 1.4 ratio (ref 0.0–3.2)
Triglycerides: 50 mg/dL (ref 0–149)
VLDL Cholesterol Cal: 10 mg/dL (ref 5–40)

## 2023-07-09 LAB — MAGNESIUM: Magnesium: 2.1 mg/dL (ref 1.6–2.3)

## 2023-07-09 LAB — FERRITIN: Ferritin: 40 ng/mL (ref 15–150)

## 2023-07-09 LAB — FOLATE: Folate: >20 ng/mL (ref >3.0)

## 2023-07-09 LAB — INSULIN, RANDOM: INSULIN: 27.3 u[IU]/mL — ABNORMAL HIGH (ref 2.6–24.9)

## 2023-07-12 ENCOUNTER — Other Ambulatory Visit: Payer: BC Managed Care – PPO

## 2023-07-14 NOTE — Addendum Note (Signed)
 Addended by: Jama Flavors on: 07/14/2023 12:26 PM   Modules accepted: Orders

## 2023-07-22 ENCOUNTER — Ambulatory Visit: Admitting: Family Medicine

## 2023-07-23 ENCOUNTER — Encounter: Payer: Self-pay | Admitting: Family Medicine

## 2023-07-23 ENCOUNTER — Ambulatory Visit: Payer: Self-pay | Admitting: Family Medicine

## 2023-07-23 VITALS — BP 112/80 | HR 67 | Temp 98.6°F | Ht 65.0 in | Wt 195.0 lb

## 2023-07-23 DIAGNOSIS — I517 Cardiomegaly: Secondary | ICD-10-CM

## 2023-07-23 DIAGNOSIS — E1169 Type 2 diabetes mellitus with other specified complication: Secondary | ICD-10-CM | POA: Diagnosis not present

## 2023-07-23 DIAGNOSIS — Z7985 Long-term (current) use of injectable non-insulin antidiabetic drugs: Secondary | ICD-10-CM

## 2023-07-23 DIAGNOSIS — J84112 Idiopathic pulmonary fibrosis: Secondary | ICD-10-CM

## 2023-07-23 DIAGNOSIS — I10 Essential (primary) hypertension: Secondary | ICD-10-CM | POA: Diagnosis not present

## 2023-07-23 DIAGNOSIS — R252 Cramp and spasm: Secondary | ICD-10-CM

## 2023-07-23 DIAGNOSIS — E785 Hyperlipidemia, unspecified: Secondary | ICD-10-CM

## 2023-07-23 MED ORDER — ROSUVASTATIN CALCIUM 10 MG PO TABS: 10.0000 mg | ORAL_TABLET | Freq: Every day | ORAL | 3 refills | Status: AC

## 2023-07-23 NOTE — Patient Instructions (Addendum)
 Call Dr Roxy Cedar office to check on status of echo cardiogram    Try to go up to 10 mg for crestor -goal LDL under 70  If worse cramps or side effects let us know   Try increasing magnesium to 500 mg daily for cramps  Mustard 1 spoon per day   Keep up the protein intake   Add more strength training if you can    Try to reserve time to care for yourself - to shop and prep food   Let's check cholesterol in 6 weeks

## 2023-07-23 NOTE — Assessment & Plan Note (Signed)
 Findings of this noted on CT of chest recently  Pt has appointment upcoming with Dr Marchelle Gearing to discuss  Has asthma  Breathing has been a bit worse  Unfortunately runs in family and according to a paper also increase risk in dentists No change in exam today  Pulse ox 98%

## 2023-07-23 NOTE — Assessment & Plan Note (Signed)
 Mild/noted incidentally on CT  Planning echocardiogram with pulmonary  We can re order if she has trouble getting this scheduled

## 2023-07-23 NOTE — Assessment & Plan Note (Signed)
.  Disc goals for lipids and reasons to control them Rev last labs with pt Rev low sat fat diet in detail  Last LDL 92 Goal under 70 if possible  Increase crestor from 5 to 10 mg daily  Alert if side effects  Plan re check

## 2023-07-23 NOTE — Progress Notes (Signed)
 Subjective:    Patient ID: Patty Williams, female    DOB: Sep 07, 1960, 63 y.o.   MRN: [id number]  HPI  Wt Readings from Last 3 Encounters:  07/23/23 195 lb (88.5 kg)  07/08/23 194 lb 9.6 oz (88.3 kg)  07/08/23 189 lb (85.7 kg)   32.45 kg/m  Vitals:   07/23/23 0827  BP: 112/80  Pulse: 67  Temp: 98.6 F (37 C)  SpO2: 98%    Pt presents to discuss findings from pulmonary visit  Pulm fibrosis  Possible cardiomegally  Muscle cramps   Went down on olmesartan dose -blood pressure was getting lower    Recent CT   IMPRESSION: 1. Very mild peripheral and basilar intralobular and interlobular lines with ground-glass, concerning for early/mild interstitial lung disease. Findings are indeterminate for UIP per consensus guidelines: Diagnosis of Idiopathic Pulmonary Fibrosis: An Official ATS/ERS/JRS/ALAT Clinical Practice Guideline. Am [name] Crit Care Med Vol 198, Iss 5, [phone], Jan 09 2017. 2. Air trapping is indicative of small airways disease. 3. Left anterior descending coronary artery calcification.  Planning a re check  Mother died of Pulm fibrosis Father has it   There is increase in risk of PF for dentists  Unsure why - ? Silicates/ powder/ grinds stone   She has appointment with Dr Marchelle Gearing - has some interest in the research  Breathing is a bit worse recently  (cleaning storage buildings) Went up on symbicort dose  Not using rescue inhaler also   Also  Cardiovascular: Minimal atherosclerotic calcification of the aorta with small amount of ulcerative plaque in the aortic arch. Left anterior descending coronary artery calcification. Heart is at the upper limits of normal in size to mildly enlarged. No pericardial effusion.  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 [employer]  Seeing patients one day per week   Lab Results  Component Value Date   NA 146 (H) 07/08/2023   K 4.0 07/08/2023   CO2 23 07/08/2023   GLUCOSE 95 07/08/2023   BUN 16 07/08/2023   CREATININE 0.63 07/08/2023   CALCIUM 9.3 07/08/2023   GFR 94.36 03/05/2023   EGFR 100 07/08/2023   GFRNONAA 98 06/27/2020   Lab Results  Component Value Date   ALT 35 (H) 07/08/2023   AST 25 07/08/2023   ALKPHOS 69 07/08/2023   BILITOT 0.5 07/08/2023   Lab Results  Component Value Date   TSH 2.44 03/05/2023   Lab Results  Component Value Date   WBC 7.2 07/08/2023   HGB 13.4 07/08/2023   HCT 40.1 07/08/2023   MCV 93 07/08/2023   PLT 233 07/08/2023    Still on ozempic  No time to plan her eating  / she only controls how much she eats  Very hard on busy schedule   Trying to keep protein high in diet  Wishes for more time to prep food and exercise   HTN bp is stable today  No cp or palpitations or headaches or edema  No side effects to medicines  BP Readings from Last 3 Encounters:  07/23/23 112/80  07/08/23 108/64  07/08/23 116/75    Olmesartan dose was dropped  to 20 mg daily       Patient Active Problem List   Diagnosis Date Noted   IPF (idiopathic pulmonary fibrosis) (HCC) 07/23/2023   Cardiomegaly 07/23/2023   Right calf pain 03/25/2023   Chronic back pain 03/16/2023   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 02/25/2023   Muscle cramps 02/11/2023   Notalgia 08/11/2022   Hyperlipidemia associated with type 2 diabetes mellitus (HCC) 07/02/2022   Stress 06/16/2022   BMI 31.0-31.9,adult 06/16/2022   Obesity, Beginning BMI 38.11 06/16/2022   Type 2 diabetes mellitus with obesity (HCC) 05/25/2022   Other hyperlipidemia 01/05/2022   Heat intolerance 12/15/2021   Type 2 diabetes mellitus with other specified complication (HCC) 09/30/2021   Lumbar  disc disease 02/11/2021   Other fatigue 06/27/2020   Vitamin D deficiency 06/27/2020   At risk for heart disease 06/27/2020   Intermittent diarrhea 03/15/2020   Hx of adenomatous polyp of colon 03/24/2019   Family history of pulmonary fibrosis 12/23/2018   Family history of kidney disease 12/23/2018   Colon cancer screening 12/23/2018   Current use of proton pump inhibitor 10/18/2015   History of fracture 10/18/2015   Left knee pain 04/19/2015   Frequent UTI 10/06/2013   Seasonal and perennial allergic rhinitis 01/01/2013   Routine general medical examination at a health care facility 04/30/2011   Diabetes mellitus treated with injections of non-insulin medication (HCC)    Plantar fasciitis    METATARSALGIA 04/11/2010   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   Obstructive sleep apnea 06/10/2009   GERD 11/09/2008   Essential hypertension 03/02/2008   Class 1 obesity due to excess calories with serious comorbidity and body mass index (BMI) of 33.0 to 33.9 in adult 11/18/2007   Allergic asthma, mild intermittent, uncomplicated 03/29/2007   Endometriosis 03/29/2007   Past Medical History:  Diagnosis Date   Anxiety    Asthma    Back pain    Bilateral ovarian cysts    Bruxism    Constipation    Diabetes mellitus type II    mild   Diverticulosis 2014   Mild   Dry mouth    Elevated blood pressure reading without diagnosis of hypertension    Endometriosis    Severe   Fibroids    GERD (gastroesophageal reflux disease)    History of ovarian cyst    HLD (hyperlipidemia)    Hx of adenomatous polyp of colon 03/24/2019   Hyperglycemia    Hyperlipidemia    Hypertension    Iron deficiency anemia    Joint pain    Menorrhagia    Obesity    Obesity    OSA on CPAP    Plantar fasciitis    with orthotics-much difficulty adjusting these   PMB (postmenopausal bleeding)    Pneumonia    history of   PONV (postoperative nausea and vomiting)    Pre-diabetes    Rectovaginal fistula     Sleep apnea    Uterine adhesion    Uterine fibroid    Wears glasses    Past Surgical History:  Procedure Laterality Date   COLONOSCOPY  01/2009   DILATATION & CURETTAGE/HYSTEROSCOPY WITH MYOSURE N/A 09/22/2018   Procedure: DILATATION & CURETTAGE/HYSTEROSCOPY;  Surgeon: Noland Fordyce, MD;  Location: [facility] ;  Service: Gynecology;  Laterality: N/A;   HEMORRHOID BANDING     HEMORRHOID SURGERY     LAPAROSCOPY     endometriosis   NASAL SEPTOPLASTY W/ TURBINOPLASTY Bilateral 09/02/2021   Procedure: NASAL SEPTOPLASTY  WITH SUBMUCOSAL RESECTION OF TURBINATES;  Surgeon: Linus Salmons, MD;  Location: [facility];  Service: ENT;  Laterality: Bilateral;   SEPTOPLASTY     april 2023   TONSILLECTOMY AND ADENOIDECTOMY     TRIGGER FINGER RELEASE     UPPER GI ENDOSCOPY  01/2009   uterine biopsy     uterine cyst exploration     UTERINE SUSPENSION     WISDOM TOOTH EXTRACTION     Social History   Tobacco Use   Smoking status: Never   Smokeless tobacco: Never  Vaping Use   Vaping status: Never Used  Substance Use Topics   Alcohol use: Not Currently    Comment: occasionally   Drug use: No   Family History  Problem Relation Age of Onset   Thyroid disease Mother        ?   Diabetes Mother    Hypertension Mother    Hyperlipidemia Mother    Anxiety disorder Mother    Sleep apnea Mother    Obesity Mother    Heart disease Mother    Cancer Father        prostate   Hypertension Father    Obesity Father    Prostate cancer Father    Heart attack Other        grandfather (in his 32's)   Parkinsonism Other        grandfater   Stomach cancer Neg Hx    Colon cancer Neg Hx    Esophageal cancer Neg Hx    No Known Allergies Current Outpatient Medications on File Prior to Visit  Medication Sig Dispense Refill   albuterol (VENTOLIN HFA) 108 (90 Base) MCG/ACT inhaler Inhale 2 puffs into the lungs every 4 (four) hours as needed. 8.5 g 5   AMBULATORY NON FORMULARY  MEDICATION Single glucometer with lancets, test strips-Check FBS and 2 hour PP(BID) 1 each 0   budesonide-formoterol (SYMBICORT) 80-4.5 MCG/ACT inhaler USE 2 INHALATIONS TWICE A DAY 10.2 g 3   Cholecalciferol (VITAMIN D3) 50 MCG (2000 UT) capsule 2 po every day ( 4 - 5000 IU every day)     Coenzyme Q10 (COQ10) 100 MG CAPS Take 1 capsule by mouth daily. Taking 300mg  daily     docusate sodium (COLACE) 100 MG capsule Take 100 mg by mouth daily as needed for mild constipation.     MAGNESIUM GLYCINATE PO Take 240 mg by mouth daily.     Multiple Vitamin (MULTIVITAMIN) tablet Take 1 tablet by mouth daily.     naproxen (NAPROSYN) 500 MG tablet Take 1 tablet (500 mg total) by mouth 2 (two) times daily as needed for moderate pain. With a meal 60 tablet 3   olmesartan (BENICAR) 40 MG tablet Take 1 tablet (40 mg total) by mouth daily. (Patient taking differently: Take 20 mg by mouth daily.) 90 tablet 2   OVER THE COUNTER MEDICATION Pepcid, Take one at bedtime daily     pantoprazole (PROTONIX) 40 MG tablet Take 1 tablet (40 mg total) by mouth 2 (two) times daily before a meal. Breakfast and supper 60 tablet 11   Semaglutide, 2 MG/DOSE, (OZEMPIC, 2 MG/DOSE,) 8 MG/3ML SOPN Inject 2 mg into the skin once a week. 3 mL 0   No current facility-administered medications on file prior to visit.    Review of Systems  Constitutional:  Negative for activity change, appetite change, fatigue, fever and unexpected weight change.  HENT:  Negative for congestion, ear pain, rhinorrhea, sinus pressure and sore throat.  Eyes:  Negative for pain, redness and visual disturbance.  Respiratory:  Positive for chest tightness and wheezing. Negative for cough and shortness of breath.        Occational wheeze and chest tightness    Cardiovascular:  Negative for chest pain and palpitations.  Gastrointestinal:  Negative for abdominal pain, blood in stool, constipation and diarrhea.  Endocrine: Negative for polydipsia and polyuria.   Genitourinary:  Negative for dysuria, frequency and urgency.  Musculoskeletal:  Positive for arthralgias, back pain and myalgias.  Skin:  Negative for pallor and rash.  Allergic/Immunologic: Negative for environmental allergies.  Neurological:  Negative for dizziness, syncope and headaches.  Hematological:  Negative for adenopathy. Does not bruise/bleed easily.  Psychiatric/Behavioral:  Negative for decreased concentration and dysphoric mood. The patient is not nervous/anxious.        Objective:   Physical Exam Constitutional:      General: She is not in acute distress.    Appearance: Normal appearance. She is well-developed. She is obese. She is not ill-appearing or diaphoretic.  HENT:     Head: Normocephalic and atraumatic.  Eyes:     Conjunctiva/sclera: Conjunctivae normal.     Pupils: Pupils are equal, round, and reactive to light.  Neck:     Thyroid: No thyromegaly.     Vascular: No carotid bruit or JVD.  Cardiovascular:     Rate and Rhythm: Normal rate and regular rhythm.     Heart sounds: Normal heart sounds.     No gallop.  Pulmonary:     Effort: Pulmonary effort is normal. No respiratory distress.     Breath sounds: Normal breath sounds. No stridor. No wheezing, rhonchi or rales.     Comments: Good air exch No crackles or rales Mildly distant bs at bases  No wheeze  Abdominal:     General: There is no distension or abdominal bruit.     Palpations: Abdomen is soft.  Musculoskeletal:     Cervical back: Normal range of motion and neck supple.     Right lower leg: No edema.     Left lower leg: No edema.  Lymphadenopathy:     Cervical: No cervical adenopathy.  Skin:    General: Skin is warm and dry.     Coloration: Skin is not pale.     Findings: No rash.  Neurological:     Mental Status: She is alert.     Coordination: Coordination normal.     Deep Tendon Reflexes: Reflexes are normal and symmetric. Reflexes normal.  Psychiatric:        Mood and Affect: Mood  normal.           Assessment & Plan:   Problem List Items Addressed This Visit       Cardiovascular and Mediastinum   Essential hypertension   bp in fair control at this time  BP Readings from Last 1 Encounters:  07/23/23 112/80   No changes needed Doing well with olmesartan 20 mg daily (dropped from 40)   Most recent labs reviewed  Disc lifstyle change with low sodium diet and exercise  Plans to keep working on wt loss and fitness  Mild cardiomegally noted on CT - echo was ordered by pulm and is pending       Relevant Medications   rosuvastatin (CRESTOR) 10 MG tablet   Cardiomegaly   Mild/noted incidentally on CT  Planning echocardiogram with pulmonary  We can re order if she has trouble getting this scheduled  Relevant Medications   rosuvastatin (CRESTOR) 10 MG tablet     Respiratory   IPF (idiopathic pulmonary fibrosis) (HCC)   Findings of this noted on CT of chest recently  Pt has appointment upcoming with Dr Marchelle Gearing to discuss  Has asthma  Breathing has been a bit worse  Unfortunately runs in family and according to a paper also increase risk in dentists No change in exam today  Pulse ox 98%         Endocrine   Hyperlipidemia associated with type 2 diabetes mellitus (HCC) - Primary   .Disc goals for lipids and reasons to control them Rev last labs with pt Rev low sat fat diet in detail  Last LDL 92 Goal under 70 if possible  Increase crestor from 5 to 10 mg daily  Alert if side effects  Plan re check       Relevant Medications   rosuvastatin (CRESTOR) 10 MG tablet   Other Relevant Orders   Lipid panel   ALT   AST     Other   Muscle cramps   In legs and sometimes hands  Discussed stretching and good hydration   Also consider increase in magnesium if well tolerated

## 2023-07-23 NOTE — Assessment & Plan Note (Signed)
 bp in fair control at this time  BP Readings from Last 1 Encounters:  07/23/23 112/80   No changes needed Doing well with olmesartan 20 mg daily (dropped from 40)   Most recent labs reviewed  Disc lifstyle change with low sodium diet and exercise  Plans to keep working on wt loss and fitness  Mild cardiomegally noted on CT - echo was ordered by pulm and is pending

## 2023-07-23 NOTE — Assessment & Plan Note (Signed)
 In legs and sometimes hands  Discussed stretching and good hydration   Also consider increase in magnesium if well tolerated

## 2023-07-27 ENCOUNTER — Telehealth: Payer: Self-pay | Admitting: Physician Assistant

## 2023-07-27 ENCOUNTER — Telehealth: Payer: Self-pay | Admitting: Internal Medicine

## 2023-07-27 NOTE — Telephone Encounter (Signed)
 PT now has [employer]. It was scanned in, she says. No more [employer].

## 2023-07-27 NOTE — Telephone Encounter (Signed)
 PT has not been called on the Echo. States it can be sched in [place] area since she lives in that area. Please call to advise what "the hold up" is. Her # is [phone]

## 2023-07-30 NOTE — Telephone Encounter (Signed)
 I will schedule the ECHO on Monday for the PT.

## 2023-08-02 NOTE — Telephone Encounter (Signed)
Pt has been scheduled and called.

## 2023-08-03 ENCOUNTER — Other Ambulatory Visit

## 2023-08-04 ENCOUNTER — Other Ambulatory Visit: Payer: Self-pay | Admitting: Family Medicine

## 2023-08-04 NOTE — Telephone Encounter (Signed)
 NFN

## 2023-08-05 ENCOUNTER — Ambulatory Visit: Attending: Cardiology

## 2023-08-05 ENCOUNTER — Encounter (INDEPENDENT_AMBULATORY_CARE_PROVIDER_SITE_OTHER): Payer: Self-pay | Admitting: Family Medicine

## 2023-08-05 ENCOUNTER — Ambulatory Visit (INDEPENDENT_AMBULATORY_CARE_PROVIDER_SITE_OTHER): Payer: BC Managed Care – PPO | Admitting: Family Medicine

## 2023-08-05 VITALS — BP 138/82 | HR 55 | Ht 65.0 in | Wt 192.0 lb

## 2023-08-05 DIAGNOSIS — E669 Obesity, unspecified: Secondary | ICD-10-CM

## 2023-08-05 DIAGNOSIS — I10 Essential (primary) hypertension: Secondary | ICD-10-CM

## 2023-08-05 DIAGNOSIS — I517 Cardiomegaly: Secondary | ICD-10-CM | POA: Diagnosis not present

## 2023-08-05 DIAGNOSIS — E119 Type 2 diabetes mellitus without complications: Secondary | ICD-10-CM

## 2023-08-05 DIAGNOSIS — R252 Cramp and spasm: Secondary | ICD-10-CM | POA: Diagnosis not present

## 2023-08-05 DIAGNOSIS — Z7985 Long-term (current) use of injectable non-insulin antidiabetic drugs: Secondary | ICD-10-CM

## 2023-08-05 DIAGNOSIS — R7401 Elevation of levels of liver transaminase levels: Secondary | ICD-10-CM | POA: Diagnosis not present

## 2023-08-05 DIAGNOSIS — J84112 Idiopathic pulmonary fibrosis: Secondary | ICD-10-CM

## 2023-08-05 DIAGNOSIS — E1169 Type 2 diabetes mellitus with other specified complication: Secondary | ICD-10-CM

## 2023-08-05 DIAGNOSIS — J8410 Pulmonary fibrosis, unspecified: Secondary | ICD-10-CM

## 2023-08-05 DIAGNOSIS — Z6831 Body mass index (BMI) 31.0-31.9, adult: Secondary | ICD-10-CM

## 2023-08-05 LAB — ECHOCARDIOGRAM COMPLETE
AR max vel: 2.34 cm2
AV Area VTI: 2.33 cm2
AV Area mean vel: 2.25 cm2
AV Mean grad: 4 mmHg
AV Peak grad: 6.8 mmHg
Ao pk vel: 1.3 m/s
Area-P 1/2: 3.89 cm2
Calc EF: 51.4 %
Height: 65 in
S' Lateral: 4 cm
Single Plane A2C EF: 50.8 %
Single Plane A4C EF: 47.4 %
Weight: 3072 [oz_av]

## 2023-08-05 MED ORDER — OZEMPIC (2 MG/DOSE) 8 MG/3ML ~~LOC~~ SOPN: 2.0000 mg | PEN_INJECTOR | SUBCUTANEOUS | 0 refills | Status: DC

## 2023-08-05 NOTE — Progress Notes (Signed)
 Office: [phone]  /  Fax: [phone]  WEIGHT SUMMARY AND BIOMETRICS  Anthropometric Measurements Height: 5\' 5"  (1.651 m) Weight: 192 lb (87.1 kg) BMI (Calculated): 31.95 Weight at Last Visit: 189 lb Weight Lost Since Last Visit: 0 Weight Gained Since Last Visit: 3 lb Starting Weight: 229 lb Total Weight Loss (lbs): 37 lb (16.8 kg)   Body Composition  Body Fat %: 41.9 % Fat Mass (lbs): 80.6 lbs Muscle Mass (lbs): 106.2 lbs Total Body Water (lbs): 74.8 lbs Visceral Fat Rating : 11   Other Clinical Data Fasting: No Labs: No Today's Visit #: 40 Starting Date: 06/27/20    Chief Complaint: OBESITY   History of Present Illness   Patty Williams is a 63 year old female with obesity, hypertension, and type 2 diabetes who presents for obesity treatment and progress monitoring.  She is adhering to her category two eating plan approximately sixty percent of the time and participates in physical therapy for exercise, twenty minutes three times per week. Despite these efforts, she has gained three pounds in the last month since her last visit. Due to dental aligners that prevent her back teeth from touching, she focuses on softer foods and proteins like salmon, tuna, and cottage cheese, as chewing salads and vegetables is difficult.  She experiences severe cramps in her legs, particularly in the calf muscles, which are intense enough to wake her at night. She has attempted to alleviate the cramps by discontinuing Crestor, increasing magnesium to 500 mg, and CoQ10 to 300 mg, but these measures have not been effective. Dry needling provided temporary relief, and the cramps occur primarily in the morning and night. No significant changes in electrolytes have been noted.  She has a history of hypertension and is currently taking Benicar for management. Her blood pressure was initially elevated at 142/87 but normalized to 138/82 upon repeat measurement during today's visit.  She has a  history of type 2 diabetes, managed with Ozempic and dietary modifications. She requests a refill of Ozempic today.  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 This new role has impacted her ability to maintain her dietary and exercise routines.          PHYSICAL EXAM:  Blood pressure 138/82, pulse (!) 55, height 5\' 5"  (1.651 m), weight 192 lb (87.1 kg), last menstrual period 10/08/2018, SpO2 98%. Body mass index is 31.95 kg/m.  DIAGNOSTIC DATA REVIEWED:  BMET    Component Value Date/Time   NA 146 (H) 07/08/2023 1006   K 4.0 07/08/2023 1006   CL 106 07/08/2023 1006   CO2 23 07/08/2023 1006   GLUCOSE 95 07/08/2023 1006   GLUCOSE 98 03/05/2023 0824   BUN 16 07/08/2023 1006   CREATININE 0.63 07/08/2023 1006   CALCIUM 9.3 07/08/2023 1006   GFRNONAA 98 06/27/2020 0840   GFRAA 113 06/27/2020 0840   Lab Results  Component Value Date   HGBA1C 5.9 (H) 07/08/2023   HGBA1C 6.5 (H) 03/02/2008   Lab Results  Component Value Date   INSULIN 27.3 (H) 07/08/2023   INSULIN 19.9 06/27/2020   Lab Results  Component Value Date   TSH 2.44 03/05/2023   CBC    Component Value Date/Time   WBC 7.2 07/08/2023 1006   WBC 6.3 03/05/2023 0824   RBC 4.30 07/08/2023 1006   RBC 4.33 03/05/2023 0824   HGB  13.4 07/08/2023 1006   HCT 40.1 07/08/2023 1006   PLT 233 07/08/2023 1006   MCV 93 07/08/2023 1006   MCH 31.2 07/08/2023 1006   MCH 28.9 09/23/2018 0404   MCHC 33.4 07/08/2023 1006   MCHC 32.1 03/05/2023 0824   RDW 13.9 07/08/2023 1006   Iron Studies    Component Value Date/Time   IRON 92 07/08/2023 1006   TIBC 382 07/08/2023 1006   FERRITIN 40 07/08/2023 1006   IRONPCTSAT 24 07/08/2023 1006   Lipid Panel     Component Value Date/Time   CHOL 166 07/08/2023 1006   TRIG 50 07/08/2023 1006   HDL 64 07/08/2023  1006   CHOLHDL 4 03/05/2023 0824   VLDL 18.4 03/05/2023 0824   LDLCALC 92 07/08/2023 1006   LDLDIRECT 144.2 09/21/2008 1008   Hepatic Function Panel     Component Value Date/Time   PROT 6.7 07/08/2023 1006   ALBUMIN 4.2 07/08/2023 1006   AST 25 07/08/2023 1006   ALT 35 (H) 07/08/2023 1006   ALKPHOS 69 07/08/2023 1006   BILITOT 0.5 07/08/2023 1006   BILIDIR 0.1 03/02/2008 1018      Component Value Date/Time   TSH 2.44 03/05/2023 0824   Nutritional Lab Results  Component Value Date   VD25OH 67.2 07/08/2023   VD25OH 34.66 03/05/2023   VD25OH 63.3 11/02/2022     Assessment and Plan    Obesity She follows her category two eating plan 60% of the time and engages in physical therapy for exercise, 20 minutes three times per week. Despite these efforts, she has gained three pounds in the last month, attributed to muscle gain and water retention. Challenges in maintaining a healthy diet, especially when eating out, are acknowledged. Emphasis is placed on protein intake and meal planning, with guidance on strategies to handle social situations involving food. - Continue category two eating plan. - Continue physical therapy for exercise. - Emphasize protein intake and meal planning. - Encourage strategies for handling social eating situations.  Type 2 Diabetes Mellitus She is treated with Ozempic and diet. She requests a refill of Ozempic. Current management plan is supported with emphasis on the importance of diet in controlling blood glucose levels. - Refill Ozempic prescription. - Continue diet, exercise and weight loss   Hypertension Her blood pressure was initially elevated at 142/87 but normalized to 138/82 upon repeat measurement. She is currently on Benicar. No concern is expressed about the current blood pressure readings. - Continue Benicar for hypertension management.  Leg Cramps She reports experiencing severe cramps in her legs, especially in the calf muscles, which  wake her up at night. Previous attempts to address the issue by stopping Crestor and increasing magnesium and CoQ10 have not been effective. Electrolytes are within normal limits, except for a slightly elevated sodium level, attributed to hydration status. The possibility of periodic limb movement disorder is considered, and consultation with a sleep specialist is suggested for further evaluation. - Consult sleep specialist for evaluation of periodic limb movement disorder. -Continue to hydrate and increase H2O to 80-100 oz per day  Pulmonary Fibrosis She has minimal pulmonary fibrosis and a family history of the condition. A high-resolution CT scan was performed, and she will be seeing a physician for further evaluation. She is advised to follow up with her pulmonologist. - Follow up with pulmonologist for further evaluation of pulmonary fibrosis. -continue to work on weight loss to help decrease intra-abdominal pressure on the lungs/diaphragm  Elevated Liver Enzymes She has a  slightly elevated ALT level at 35, with normal levels being up to 32. She has a history of transient elevations in liver enzymes, which may be related to fatty liver or recent illness. Plans to recheck the liver enzymes at the end of spring to ensure normalization. - Order liver function test at the end of spring to recheck ALT levels. -Continue diet and exercise  Follow-up 4 weeks         I have personally spent 40 minutes total time today in preparation, patient care, and documentation for this visit, including the following: review of clinical lab tests; review of medical tests/procedures/services.    She was informed of the importance of frequent follow up visits to maximize her success with intensive lifestyle modifications for her multiple health conditions.    Quillian Quince, MD

## 2023-08-08 ENCOUNTER — Telehealth: Payer: Self-pay | Admitting: Internal Medicine

## 2023-08-08 DIAGNOSIS — Z0289 Encounter for other administrative examinations: Secondary | ICD-10-CM

## 2023-08-08 NOTE — Telephone Encounter (Signed)
 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

## 2023-08-09 ENCOUNTER — Ambulatory Visit: Payer: BC Managed Care – PPO | Admitting: Internal Medicine

## 2023-08-10 ENCOUNTER — Other Ambulatory Visit: Payer: Self-pay | Admitting: Family Medicine

## 2023-08-13 LAB — CMP14+EGFR
ALT: 35 IU/L — ABNORMAL HIGH (ref 0–32)
AST: 27 IU/L (ref 0–40)
Albumin: 4.2 g/dL (ref 3.9–4.9)
Alkaline Phosphatase: 73 IU/L (ref 44–121)
BUN/Creatinine Ratio: 28 (ref 12–28)
BUN: 21 mg/dL (ref 8–27)
Bilirubin Total: 0.4 mg/dL (ref 0.0–1.2)
CO2: 23 mmol/L (ref 20–29)
Calcium: 9.2 mg/dL (ref 8.7–10.3)
Chloride: 101 mmol/L (ref 96–106)
Creatinine, Ser: 0.76 mg/dL (ref 0.57–1.00)
Globulin, Total: 2.4 g/dL (ref 1.5–4.5)
Glucose: 92 mg/dL (ref 70–99)
Potassium: 4.2 mmol/L (ref 3.5–5.2)
Sodium: 142 mmol/L (ref 134–144)
Total Protein: 6.6 g/dL (ref 6.0–8.5)
eGFR: 88 mL/min/{1.73_m2} (ref >59)

## 2023-08-16 ENCOUNTER — Ambulatory Visit: Admitting: Family Medicine

## 2023-08-16 ENCOUNTER — Encounter: Payer: Self-pay | Admitting: Family Medicine

## 2023-08-16 VITALS — BP 121/70 | HR 75 | Temp 98.4°F | Ht 65.0 in | Wt 196.2 lb

## 2023-08-16 DIAGNOSIS — R252 Cramp and spasm: Secondary | ICD-10-CM | POA: Diagnosis not present

## 2023-08-16 DIAGNOSIS — I10 Essential (primary) hypertension: Secondary | ICD-10-CM

## 2023-08-16 DIAGNOSIS — G8929 Other chronic pain: Secondary | ICD-10-CM

## 2023-08-16 DIAGNOSIS — J84112 Idiopathic pulmonary fibrosis: Secondary | ICD-10-CM

## 2023-08-16 DIAGNOSIS — R7401 Elevation of levels of liver transaminase levels: Secondary | ICD-10-CM | POA: Diagnosis not present

## 2023-08-16 DIAGNOSIS — I5189 Other ill-defined heart diseases: Secondary | ICD-10-CM | POA: Diagnosis not present

## 2023-08-16 DIAGNOSIS — M549 Dorsalgia, unspecified: Secondary | ICD-10-CM

## 2023-08-16 DIAGNOSIS — F439 Reaction to severe stress, unspecified: Secondary | ICD-10-CM

## 2023-08-16 DIAGNOSIS — M722 Plantar fascial fibromatosis: Secondary | ICD-10-CM

## 2023-08-16 DIAGNOSIS — M79661 Pain in right lower leg: Secondary | ICD-10-CM

## 2023-08-16 NOTE — Assessment & Plan Note (Signed)
 Grade 1 noted on her echo  In setting of PF, OSA, obesity  HTN and cholesterol -controlled with medications   Pt is interested in getting est with cardiology  Referral done   Taking benicar 20 mg  Crestor 10

## 2023-08-16 NOTE — Assessment & Plan Note (Signed)
 New job more stressful than anticipated Cuts into time for self care Plans to go part time

## 2023-08-16 NOTE — Assessment & Plan Note (Signed)
 ALT very mildly elevated at 35  Twice in a row  Has had some speed bumps for weight loss recently /working too much and some chronic pain problems   Still going to weight loss clinic   Suspect this ALT increase is from increase in crestor to 10  Do want to assess for fatty liver  US abd ordered   No symptoms

## 2023-08-16 NOTE — Progress Notes (Signed)
 Subjective:    Patient ID: Patty Williams, female    DOB: [birth year], 63 y.o.   MRN: [id number]  HPI  Wt Readings from Last 3 Encounters:  08/16/23 196 lb 4 oz (89 kg)  08/05/23 192 lb (87.1 kg)  07/23/23 195 lb (88.5 kg)   32.66 kg/m  Vitals:   08/16/23 0845 08/16/23 0914  BP: (!) 140/94 121/70  Pulse: 75   Temp: 98.4 F (36.9 C)   SpO2: 97%    Pt presents for follow up of echocardiogram Leg cramps Abnormal liver labs  HTN Fill out forms for partial disability due to back pain and other problems    Liver- ALT elevated times 2 / same number Mild  Lab Results  Component Value Date   ALT 35 (H) 08/12/2023   AST 27 08/12/2023   ALKPHOS 73 08/12/2023   BILITOT 0.4 08/12/2023   Naproxen- taking it less and less  Crestor -went up to 10 , this may be reason   No etoh  No tylenol   Is at risk for fatty liver   Leg cramps Thinks orthotics play a role?  Worse if she sits all day  Better if she does cardio /more exercise  Right side is more crampy side More plantar fasciitis = playing around with all the factors  Takes 500 mg magnesium  51 oz of water daily , some coffee in am    Is going to go part time to take care of herself  Weight started going back up when she took the job     HTN bp is stable today (better on 2nd check)  No cp or palpitations or headaches or edema  No side effects to medicines  BP Readings from Last 3 Encounters:  08/16/23 121/70  08/05/23 138/82  07/23/23 112/80    Did recently decrease benicar dose to 20   Paperwork  [employer] -partial disability  Chronic back and foot and leg pain / incl SI joints and rhomboid and calf  Is in PT  Since last visit : Trigger point inj - in calf and back/ dry needling   Osteopath- considering accupuncture   [facility]- 2 SI joint inj - steroid  Rhomboid trigger point injections    Cholesterol  Lab Results  Component Value Date   CHOL 166 07/08/2023   HDL 64 07/08/2023    LDLCALC 92 07/08/2023   LDLDIRECT 144.2 09/21/2008   TRIG 50 07/08/2023   CHOLHDL 4 03/05/2023   Crestor 10     Patient Active Problem List   Diagnosis Date Noted   Diastolic dysfunction 08/16/2023   IPF (idiopathic pulmonary fibrosis) (HCC) 07/23/2023   Cardiomegaly 07/23/2023   Right calf pain 03/25/2023   Chronic back pain 03/16/2023   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 02/25/2023   Muscle cramps 02/11/2023   Notalgia 08/11/2022   Hyperlipidemia associated with type 2 diabetes mellitus (HCC) 07/02/2022   Elevated ALT measurement 07/02/2022   Stress 06/16/2022   BMI 31.0-31.9,adult 06/16/2022   Obesity, Beginning BMI 38.11 06/16/2022   Type 2 diabetes mellitus with obesity (HCC) 05/25/2022   Other hyperlipidemia 01/05/2022   Heat intolerance 12/15/2021   Type 2 diabetes mellitus with other specified complication (HCC) 09/30/2021   Lumbar disc disease 02/11/2021   Other fatigue 06/27/2020   Vitamin D deficiency 06/27/2020   At risk for heart disease 06/27/2020   Intermittent diarrhea 03/15/2020   Hx of adenomatous polyp of colon 03/24/2019   Family  history of pulmonary fibrosis 12/23/2018   Family history of kidney disease 12/23/2018   Colon cancer screening 12/23/2018   Current use of proton pump inhibitor 10/18/2015   History of fracture 10/18/2015   Left knee pain 04/19/2015   Frequent UTI 10/06/2013   Seasonal and perennial allergic rhinitis 01/01/2013   Routine general medical examination at a health care facility 04/30/2011   Diabetes mellitus treated with injections of non-insulin medication (HCC)    Plantar fasciitis    METATARSALGIA 04/11/2010   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   Obstructive sleep apnea 06/10/2009   GERD 11/09/2008   Essential hypertension 03/02/2008   Class 1 obesity due to excess calories with serious comorbidity and body mass index (BMI) of 33.0 to 33.9 in adult 11/18/2007   Allergic asthma, mild  intermittent, uncomplicated 03/29/2007   Endometriosis 03/29/2007   Past Medical History:  Diagnosis Date   Anxiety    Asthma    Back pain    Bilateral ovarian cysts    Bruxism    Constipation    Diabetes mellitus type II    mild   Diverticulosis 2014   Mild   Dry mouth    Elevated blood pressure reading without diagnosis of hypertension    Endometriosis    Severe   Fibroids    GERD (gastroesophageal reflux disease)    History of ovarian cyst    HLD (hyperlipidemia)    Hx of adenomatous polyp of colon 03/24/2019   Hyperglycemia    Hyperlipidemia    Hypertension    Iron deficiency anemia    Joint pain    Menorrhagia    Obesity    Obesity    OSA on CPAP    Plantar fasciitis    with orthotics-much difficulty adjusting these   PMB (postmenopausal bleeding)    Pneumonia    history of   PONV (postoperative nausea and vomiting)    Pre-diabetes    Rectovaginal fistula    Sleep apnea    Uterine adhesion    Uterine fibroid    Wears glasses    Past Surgical History:  Procedure Laterality Date   COLONOSCOPY  01/2009   DILATATION & CURETTAGE/HYSTEROSCOPY WITH MYOSURE N/A 09/22/2018   Procedure: DILATATION & CURETTAGE/HYSTEROSCOPY;  Surgeon: Noland Fordyce, MD;  Location: [facility];  Service: Gynecology;  Laterality: N/A;   HEMORRHOID BANDING     HEMORRHOID SURGERY     LAPAROSCOPY     endometriosis   NASAL SEPTOPLASTY W/ TURBINOPLASTY Bilateral 09/02/2021   Procedure: NASAL SEPTOPLASTY WITH SUBMUCOSAL RESECTION OF TURBINATES;  Surgeon: Linus Salmons, MD;  Location: [facility];  Service: ENT;  Laterality: Bilateral;   SEPTOPLASTY     april 2023   TONSILLECTOMY AND ADENOIDECTOMY     TRIGGER FINGER RELEASE     UPPER GI ENDOSCOPY  01/2009   uterine biopsy     uterine cyst exploration     UTERINE SUSPENSION     WISDOM TOOTH EXTRACTION     Social History   Tobacco Use   Smoking status: Never   Smokeless tobacco: Never  Vaping Use   Vaping  status: Never Used  Substance Use Topics   Alcohol use: Not Currently    Comment: occasionally   Drug use: No   Family History  Problem Relation Age of Onset   Thyroid disease Mother        ?   Diabetes Mother    Hypertension Mother    Hyperlipidemia Mother    Anxiety disorder  Mother    Sleep apnea Mother    Obesity Mother    Heart disease Mother    Cancer Father        prostate   Hypertension Father    Obesity Father    Prostate cancer Father    Heart attack Other        grandfather (in his 95's)   Parkinsonism Other        grandfater   Stomach cancer Neg Hx    Colon cancer Neg Hx    Esophageal cancer Neg Hx    No Known Allergies Current Outpatient Medications on File Prior to Visit  Medication Sig Dispense Refill   albuterol (VENTOLIN HFA) 108 (90 Base) MCG/ACT inhaler Inhale 2 puffs into the lungs every 4 (four) hours as needed. 8.5 g 5   AMBULATORY NON FORMULARY MEDICATION Single glucometer with lancets, test strips-Check FBS and 2 hour PP(BID) 1 each 0   budesonide-formoterol (SYMBICORT) 80-4.5 MCG/ACT inhaler USE 2 INHALATIONS TWICE A DAY 10.2 g 5   Cholecalciferol (VITAMIN D3) 50 MCG (2000 UT) capsule 2 po every day ( 4 - 5000 IU every day)     Coenzyme Q10 (CO Q 10 PO) Take 300 mg by mouth daily.     docusate sodium (COLACE) 100 MG capsule Take 100 mg by mouth daily as needed for mild constipation.     famotidine (PEPCID) 40 MG tablet Take 40 mg by mouth daily.     MAGNESIUM GLYCINATE PO Take 240 mg by mouth in the morning and at bedtime.     Multiple Vitamin (MULTIVITAMIN) tablet Take 1 tablet by mouth daily.     naproxen (NAPROSYN) 500 MG tablet Take 1 tablet (500 mg total) by mouth 2 (two) times daily as needed for moderate pain. With a meal 60 tablet 3   olmesartan (BENICAR) 40 MG tablet Take 1 tablet (40 mg total) by mouth daily. (Patient taking differently: Take 20 mg by mouth daily.) 90 tablet 2   pantoprazole (PROTONIX) 40 MG tablet Take 1 tablet (40 mg  total) by mouth 2 (two) times daily before a meal. Breakfast and supper 60 tablet 11   rosuvastatin (CRESTOR) 10 MG tablet Take 1 tablet (10 mg total) by mouth daily. 90 tablet 3   Semaglutide, 2 MG/DOSE, (OZEMPIC, 2 MG/DOSE,) 8 MG/3ML SOPN Inject 2 mg into the skin once a week. 3 mL 0   No current facility-administered medications on file prior to visit.    Review of Systems  Constitutional:  Negative for activity change, appetite change, fatigue, fever and unexpected weight change.  HENT:  Negative for congestion, ear pain, rhinorrhea, sinus pressure and sore throat.   Eyes:  Negative for pain, redness and visual disturbance.  Respiratory:  Negative for cough, shortness of breath and wheezing.   Cardiovascular:  Negative for chest pain and palpitations.  Gastrointestinal:  Negative for abdominal pain, blood in stool, constipation and diarrhea.  Endocrine: Negative for polydipsia and polyuria.  Genitourinary:  Negative for dysuria, frequency and urgency.  Musculoskeletal:  Positive for arthralgias and back pain. Negative for myalgias.       Muscle cramps   Skin:  Negative for pallor and rash.  Allergic/Immunologic: Negative for environmental allergies.  Neurological:  Negative for dizziness, syncope and headaches.  Hematological:  Negative for adenopathy. Does not bruise/bleed easily.  Psychiatric/Behavioral:  Negative for decreased concentration and dysphoric mood. The patient is not nervous/anxious.        Objective:   Physical Exam Constitutional:  General: She is not in acute distress.    Appearance: Normal appearance. She is well-developed. She is obese. She is not ill-appearing or diaphoretic.  HENT:     Head: Normocephalic and atraumatic.  Eyes:     Conjunctiva/sclera: Conjunctivae normal.     Pupils: Pupils are equal, round, and reactive to light.  Neck:     Thyroid: No thyromegaly.     Vascular: No carotid bruit or JVD.  Cardiovascular:     Rate and Rhythm:  Normal rate and regular rhythm.     Heart sounds: Normal heart sounds.     No gallop.  Pulmonary:     Effort: Pulmonary effort is normal. No respiratory distress.     Breath sounds: Normal breath sounds. No wheezing or rales.  Abdominal:     General: There is no distension or abdominal bruit.     Palpations: Abdomen is soft.  Musculoskeletal:     Cervical back: Normal range of motion and neck supple.     Right lower leg: No edema.     Left lower leg: No edema.  Lymphadenopathy:     Cervical: No cervical adenopathy.  Skin:    General: Skin is warm and dry.     Coloration: Skin is not pale.     Findings: No rash.  Neurological:     Mental Status: She is alert.     Coordination: Coordination normal.     Deep Tendon Reflexes: Reflexes are normal and symmetric. Reflexes normal.  Psychiatric:        Mood and Affect: Mood normal.           Assessment & Plan:   Problem List Items Addressed This Visit       Cardiovascular and Mediastinum   Essential hypertension - Primary   bp in fair control at this time  BP Readings from Last 1 Encounters:  08/16/23 121/70   No changes needed Doing well with olmesartan 20 mg daily (dropped from 40)   Most recent labs reviewed  Disc lifstyle change with low sodium diet and exercise  Plans to keep working on wt loss and fitness  Echo noted some DD and referred to cardiology to discuss        Respiratory   IPF (idiopathic pulmonary fibrosis) (HCC)   Continues pulmonary care No clinical changes         Musculoskeletal and Integument   Plantar fasciitis   Some pain with walking/ has never done well with her orthotics Needs more exercise Recommend more use of exercise bike Strength training that does not bother her feet   ? If needs new/different orthotics or shoes         Other   Stress   New job more stressful than anticipated Cuts into time for self care Plans to go part time         Right calf pain   Did have  some dry needling    Considering acupuncture for all pain issues       Muscle cramps   These seem to be improved when pt exercises more (but plantar fasciitis is making this more difficult)  Suggest riding exercise bike for regular intervals Continue magnesium and fluids   Last labs reviewed        Elevated ALT measurement   ALT very mildly elevated at 35  Twice in a row  Has had some speed bumps for weight loss recently /working too much and some chronic pain problems  Still going to weight loss clinic   Suspect this ALT increase is from increase in crestor to 10  Do want to assess for fatty liver  US abd ordered   No symptoms      Relevant Orders   US Abdomen Limited RUQ (LIVER/GB)   Diastolic dysfunction   Grade 1 noted on her echo  In setting of PF, OSA, obesity  HTN and cholesterol -controlled with medications   Pt is interested in getting est with cardiology  Referral done   Taking benicar 20 mg  Crestor 10        Relevant Orders   Ambulatory referral to Cardiology   Chronic back pain   Has had 2 SI joint injections and rhomboid trigger point injections (anesthetic and steroid) from [facility] in PT  Also arranging for acupuncture    Worked on forms for partial disability

## 2023-08-16 NOTE — Assessment & Plan Note (Signed)
 These seem to be improved when pt exercises more (but plantar fasciitis is making this more difficult)  Suggest riding exercise bike for regular intervals Continue magnesium and fluids   Last labs reviewed

## 2023-08-16 NOTE — Assessment & Plan Note (Signed)
 bp in fair control at this time  BP Readings from Last 1 Encounters:  08/16/23 121/70   No changes needed Doing well with olmesartan 20 mg daily (dropped from 40)   Most recent labs reviewed  Disc lifstyle change with low sodium diet and exercise  Plans to keep working on wt loss and fitness  Echo noted some DD and referred to cardiology to discuss

## 2023-08-16 NOTE — Assessment & Plan Note (Addendum)
 Has had 2 SI joint injections and rhomboid trigger point injections (anesthetic and steroid) from [facility] in [facility]  Also arranging for acupuncture    Worked on forms for partial disability

## 2023-08-16 NOTE — Assessment & Plan Note (Signed)
 Some pain with walking/ has never done well with her orthotics Needs more exercise Recommend more use of exercise bike Strength training that does not bother her feet   ? If needs new/different orthotics or shoes

## 2023-08-16 NOTE — Assessment & Plan Note (Signed)
 Continues pulmonary care No clinical changes

## 2023-08-16 NOTE — Assessment & Plan Note (Signed)
 Did have some dry needling    Considering acupuncture for all pain issues

## 2023-08-16 NOTE — Patient Instructions (Addendum)
 Start using the stationary bike more  See if this exercise helps your cramps   Incorporate strength training if/when you can   Warm up  Then exercise Then stretch   I want to get an abdominal ultrasound to look at your liver   I put the referral in for abdominal ultrasound and also cardiology visit to get established  Please let us know if you don't hear in 1-2 weeks

## 2023-08-19 ENCOUNTER — Ambulatory Visit
Admission: RE | Admit: 2023-08-19 | Discharge: 2023-08-19 | Discharge status: Home / Self Care | Source: Ambulatory Visit | Attending: Family Medicine | Admitting: Family Medicine

## 2023-08-19 ENCOUNTER — Encounter: Payer: Self-pay | Admitting: Family Medicine

## 2023-08-19 DIAGNOSIS — R7401 Elevation of levels of liver transaminase levels: Secondary | ICD-10-CM

## 2023-08-22 ENCOUNTER — Encounter: Payer: Self-pay | Admitting: Family Medicine

## 2023-08-22 DIAGNOSIS — M7060 Trochanteric bursitis, unspecified hip: Secondary | ICD-10-CM | POA: Insufficient documentation

## 2023-08-22 DIAGNOSIS — M7600 Gluteal tendinitis, unspecified hip: Secondary | ICD-10-CM | POA: Insufficient documentation

## 2023-08-22 DIAGNOSIS — M5090 Cervical disc disorder, unspecified, unspecified cervical region: Secondary | ICD-10-CM | POA: Insufficient documentation

## 2023-08-24 ENCOUNTER — Other Ambulatory Visit

## 2023-08-25 ENCOUNTER — Telehealth: Payer: Self-pay

## 2023-08-25 NOTE — Telephone Encounter (Signed)
 Left message to return call to office. Need to follow up regarding FMLA/disability forms.

## 2023-08-30 NOTE — Telephone Encounter (Signed)
 Reached out to patient regarding question to [facility], she is going to speak with her attorney and get back to me.

## 2023-08-31 ENCOUNTER — Other Ambulatory Visit: Payer: Self-pay | Admitting: Family Medicine

## 2023-08-31 NOTE — Telephone Encounter (Signed)
 Forms completed, faxed, copy sent to scan, and a copy for the patient to pick up is at the front desk per patient request.

## 2023-09-01 ENCOUNTER — Other Ambulatory Visit: Payer: Self-pay | Admitting: Family Medicine

## 2023-09-01 NOTE — Telephone Encounter (Signed)
 Note on last OV (08/16/23) says pt decreased med to 20 mg, ? If you want to refill the 40 or send in Rx in for 20 mg. Please advise

## 2023-09-06 ENCOUNTER — Ambulatory Visit (INDEPENDENT_AMBULATORY_CARE_PROVIDER_SITE_OTHER): Payer: BC Managed Care – PPO | Admitting: Family Medicine

## 2023-09-06 ENCOUNTER — Encounter (INDEPENDENT_AMBULATORY_CARE_PROVIDER_SITE_OTHER): Payer: Self-pay | Admitting: Family Medicine

## 2023-09-06 VITALS — BP 124/81 | HR 72 | Temp 98.6°F | Ht 65.0 in | Wt 191.0 lb

## 2023-09-06 DIAGNOSIS — E1169 Type 2 diabetes mellitus with other specified complication: Secondary | ICD-10-CM | POA: Diagnosis not present

## 2023-09-06 DIAGNOSIS — E66811 Obesity, class 1: Secondary | ICD-10-CM

## 2023-09-06 DIAGNOSIS — Z6831 Body mass index (BMI) 31.0-31.9, adult: Secondary | ICD-10-CM | POA: Diagnosis not present

## 2023-09-06 DIAGNOSIS — I10 Essential (primary) hypertension: Secondary | ICD-10-CM

## 2023-09-06 DIAGNOSIS — E669 Obesity, unspecified: Secondary | ICD-10-CM | POA: Diagnosis not present

## 2023-09-06 DIAGNOSIS — Z7985 Long-term (current) use of injectable non-insulin antidiabetic drugs: Secondary | ICD-10-CM

## 2023-09-06 NOTE — Progress Notes (Signed)
 Rae Bugler, D.O.  ABFM, ABOM Specializing in Clinical Bariatric Medicine  Office located at: [address] [place], [place]  [address]   Assessment and Plan:  No orders of the defined types were placed in this encounter.  There are no discontinued medications.   No orders of the defined types were placed in this encounter.    FOR THE DISEASE OF OBESITY:  Obesity (BMI 30.0-34.9) -current BM 31.78 Obesity, Beginning BMI 38.11 Assessment & Plan: Since last office visit on 08/05/2023, patient's muscle mass has decreased by 0.6 lbs. Fat mass has not changed. Total body water has increased by 0.4 lbs.  Counseling done on how various foods will affect these numbers and how to maximize success  Total lbs lost to date: 38 lbs Total weight loss percentage to date: 16.59%    Recommended Dietary Goals Patty Williams is currently in the action stage of change. As such, her goal is to continue weight management plan.  She has agreed to: continue current plan   Behavioral Intervention We discussed the following today: High protein snacking options [facility], [employer] protein chips, etc)  Additional resources provided today: Handout on [employer] Guide, Handout on Common Characteristics of Successful Weight Losers,   Evidence-based interventions for health behavior change were utilized today including the discussion of self monitoring techniques, problem-solving barriers and SMART goal setting techniques.   Regarding patient's less desirable eating habits and patterns, we employed the technique of small changes.   Pt will specifically work on: Setting boundaries for next visit.    Recommended Physical Activity Goals Kajsa has been advised to work up to 300-450 minutes of moderate intensity aerobic activity a week and strengthening exercises 2-3 times per week for cardiovascular health, weight loss maintenance and preservation of muscle mass.   She has agreed to: Exercise  at least 30 minutes daily   Pharmacotherapy We both agreed to : continue with nutritional and behavioral strategies and current medication regimen   FOR ASSOCIATED CONDITIONS ADDRESSED TODAY:  Type 2 diabetes mellitus with other specified complication, unspecified whether long term insulin  use [facility]) Assessment & Plan: Patty Williams is taking Ozempic  2 mg once weekly. Tolerating well, denies any N/V/D. Hunger/cravings well controlled. Intensive lifestyle modification including diet, exercise and weight loss are the first line of treatment for diabetes. We extensively discussed the importance of decreasing simple carbs and how certain foods they eat will affect their blood sugars. Hypoglycemia prevention discussed with the patient.  Eat on a regular basis- no skipping or going long periods without eating. Will recheck levels 3-4 months from last obtained results.    Essential hypertension Assessment & Plan: BP Readings from Last 3 Encounters:  09/06/23 124/81  08/16/23 121/70  08/05/23 138/82  Patty Williams takes Benicar  20 mg daily. BP is at goal today. Lifestyle changes such as following our low salt, heart healthy meal plan and engaging in a regular exercise program discussed. Ambulatory blood pressure monitoring encouraged.  Reminded patient that if they ever feel poorly in any way, to check their blood pressure and pulse as well. We will continue to monitor symptoms as they relate to the her weight loss journey.  Follow up:   Return in about 31 days (around 10/07/2023). She was informed of the importance of frequent follow up visits to maximize her success with intensive lifestyle modifications for her multiple health conditions.  Subjective:   Chief complaint: Obesity Patty Williams is here to discuss her progress with her obesity treatment plan. She  is on the the Category 2 Plan and states she is following her eating plan approximately 50% of the time. She states she is walking or doing physical therapy 90  minutes per week.  Interval History:  Patty Williams is here for a follow up office visit. Since last OV on 08/05/2023, pt is down 1 lb. 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 Additionally, Patty Williams endorses not always being able to control what she is eating, but is watching her protein intake. Pt denies skipping meals, and is drinking about 50-64 ounces of water daily.   Pharmacotherapy for weight loss: She is currently taking Ozempic  2 mg once weekly.   Review of Systems:  Pertinent positives were addressed with patient today.  Reviewed by clinician on day of visit: allergies, medications, problem list, medical history, surgical history, family history, social history, and previous encounter notes.  Weight Summary and Biometrics   Weight Lost Since Last Visit: 1lb  Weight Gained Since Last Visit: 0lb   Vitals Temp: 98.6 F (37 C) BP: 124/81 Pulse Rate: 72 SpO2: 99 %   Anthropometric Measurements Height: 5\' 5"  (1.651 m) Weight: 191 lb (86.6 kg) BMI (Calculated): 31.78 Weight at Last Visit: 192lb Weight Lost Since Last Visit: 1lb Weight Gained Since Last Visit: 0lb Starting Weight: 229lb Total Weight Loss (lbs): 38 lb (17.2 kg)   Body Composition  Body Fat %: 42 % Fat Mass (lbs): 80.6 lbs Muscle Mass (lbs): 105.6 lbs Total Body Water (lbs): 75.2 lbs Visceral Fat Rating : 11   Other Clinical Data Fasting: No Labs: No Today's Visit #: 41 Starting Date: 06/27/20    Objective:   PHYSICAL EXAM: Blood pressure 124/81, pulse 72, temperature 98.6 F (37 C), height 5\' 5"  (1.651 m), weight 191 lb (86.6 kg), last menstrual period 10/08/2018, SpO2 99%. Body mass index is 31.78 kg/m.  General: she is overweight, cooperative and in no acute distress. PSYCH: Has normal mood, affect and thought process.   HEENT: EOMI, sclerae are anicteric. Lungs: Normal breathing effort, no conversational dyspnea. Extremities: Moves *  4 Neurologic: A and O * 3, good insight  DIAGNOSTIC DATA REVIEWED: BMET    Component Value Date/Time   NA 142 08/12/2023 1607   K 4.2 08/12/2023 1607   CL 101 08/12/2023 1607   CO2 23 08/12/2023 1607   GLUCOSE 92 08/12/2023 1607   GLUCOSE 98 03/05/2023 0824   BUN 21 08/12/2023 1607   CREATININE 0.76 08/12/2023 1607   CALCIUM  9.2 08/12/2023 1607   GFRNONAA 98 06/27/2020 0840   GFRAA 113 06/27/2020 0840   Lab Results  Component Value Date   HGBA1C 5.9 (H) 07/08/2023   HGBA1C 6.5 (H) 03/02/2008   Lab Results  Component Value Date   INSULIN  27.3 (H) 07/08/2023   INSULIN  19.9 06/27/2020   Lab Results  Component Value Date   TSH 2.44 03/05/2023   CBC    Component Value Date/Time   WBC 7.2 07/08/2023 1006   WBC 6.3 03/05/2023 0824   RBC 4.30 07/08/2023 1006   RBC 4.33 03/05/2023 0824   HGB 13.4 07/08/2023 1006   HCT 40.1 07/08/2023 1006   PLT 233 07/08/2023 1006   MCV 93 07/08/2023 1006   MCH 31.2 07/08/2023 1006   MCH 28.9 09/23/2018 0404   MCHC 33.4 07/08/2023 1006   MCHC 32.1 03/05/2023 0824   RDW 13.9 07/08/2023 1006   Iron Studies    Component Value Date/Time   IRON  92 07/08/2023 1006   TIBC 382 07/08/2023 1006   FERRITIN 40 07/08/2023 1006   IRONPCTSAT 24 07/08/2023 1006   Lipid Panel     Component Value Date/Time   CHOL 166 07/08/2023 1006   TRIG 50 07/08/2023 1006   HDL 64 07/08/2023 1006   CHOLHDL 4 03/05/2023 0824   VLDL 18.4 03/05/2023 0824   LDLCALC 92 07/08/2023 1006   LDLDIRECT 144.2 09/21/2008 1008   Hepatic Function Panel     Component Value Date/Time   PROT 6.6 08/12/2023 1607   ALBUMIN 4.2 08/12/2023 1607   AST 27 08/12/2023 1607   ALT 35 (H) 08/12/2023 1607   ALKPHOS 73 08/12/2023 1607   BILITOT 0.4 08/12/2023 1607   BILIDIR 0.1 03/02/2008 1018      Component Value Date/Time   TSH 2.44 03/05/2023 0824   Nutritional Lab Results  Component Value Date   VD25OH 67.2 07/08/2023   VD25OH 34.66 03/05/2023   VD25OH 63.3  11/02/2022    Attestations:   I, Camryn Mix, acting as a Stage manager for [employer], DO., have compiled all relevant documentation for today's office visit on behalf of Marceil Sensor, DO, while in the presence of [employer], DO.  Reviewed by clinician on day of visit: allergies, medications, problem list, medical history, surgical history, family history, social history, and previous encounter notes pertinent to patient's obesity diagnosis. I have spent 40 minutes in the care of the patient today specifically: 32 minutes spent on direct face to face counseling with patient on self care strategies, food prepping and planning, setting boundaries with self and others, learning ways to uplift self, and goal setting for mental/physical wellbeing, 8 minutes spent on any pre/post care notes in addition to information below preparing to see patient (e.g. review and interpretation of tests, old notes ), obtaining and/or reviewing separately obtained history, performing a medically appropriate examination or evaluation, counseling and educating the patient, ordering medications, test or procedures, documenting clinical information in the electronic or other health care record, and independently interpreting results and communicating results to the patient, family, or caregiver   I have reviewed the above documentation for accuracy and completeness, and I agree with the above. Rae Bugler, D.O.  The 21st Century Cures Act was signed into law in 2016 which includes the topic of electronic health records.  This provides immediate access to information in MyChart.  This includes consultation notes, operative notes, office notes, lab results and pathology reports.  If you have any questions about what you read please let us  know at your next visit so we can discuss your concerns and take corrective action if need be.  We are right here with you.

## 2023-09-08 ENCOUNTER — Other Ambulatory Visit (INDEPENDENT_AMBULATORY_CARE_PROVIDER_SITE_OTHER): Payer: Self-pay | Admitting: Family Medicine

## 2023-09-08 DIAGNOSIS — E1169 Type 2 diabetes mellitus with other specified complication: Secondary | ICD-10-CM

## 2023-09-16 ENCOUNTER — Encounter: Payer: Self-pay | Admitting: Family Medicine

## 2023-09-20 ENCOUNTER — Other Ambulatory Visit: Payer: Self-pay | Admitting: *Deleted

## 2023-09-20 ENCOUNTER — Encounter: Payer: Self-pay | Admitting: Internal Medicine

## 2023-09-20 ENCOUNTER — Ambulatory Visit: Payer: BC Managed Care – PPO | Admitting: Internal Medicine

## 2023-09-20 VITALS — BP 108/76 | HR 77 | Ht 65.0 in | Wt 199.0 lb

## 2023-09-20 DIAGNOSIS — J849 Interstitial pulmonary disease, unspecified: Secondary | ICD-10-CM | POA: Diagnosis not present

## 2023-09-20 DIAGNOSIS — Z836 Family history of other diseases of the respiratory system: Secondary | ICD-10-CM

## 2023-09-20 DIAGNOSIS — R768 Other specified abnormal immunological findings in serum: Secondary | ICD-10-CM

## 2023-09-20 DIAGNOSIS — R911 Solitary pulmonary nodule: Secondary | ICD-10-CM | POA: Diagnosis not present

## 2023-09-20 DIAGNOSIS — I2510 Atherosclerotic heart disease of native coronary artery without angina pectoris: Secondary | ICD-10-CM

## 2023-09-20 DIAGNOSIS — I2584 Coronary atherosclerosis due to calcified coronary lesion: Secondary | ICD-10-CM | POA: Diagnosis not present

## 2023-09-20 MED ORDER — OLMESARTAN MEDOXOMIL 40 MG PO TABS: 40.0000 mg | ORAL_TABLET | Freq: Every day | ORAL | 3 refills | Status: AC

## 2023-09-20 NOTE — Telephone Encounter (Signed)
 Sent mychart asking pt what pharmacy and advised her of Dr. Belva Boyden comments

## 2023-09-20 NOTE — Telephone Encounter (Signed)
 Yes, stay on this dose I pended the prescription  Please send to the pharmacy of choice, thanks

## 2023-09-20 NOTE — Telephone Encounter (Signed)
 Pt uses [facility], Rx sent and pt aware

## 2023-09-20 NOTE — Patient Instructions (Addendum)
 Family history of pulmonary fibrosis - ILD (interstitial lung disease) (HCC) -  Positive ANA 1: 80 in 2024  - I think of interstitial lung abnormality and heart disease.  Pulmonary function test is normal and you are asymptomatic  Plan - Do risk assessment for future progression  -0 complete serology profile. - Refer to genetics counselor Marijo Shove - Keep pulmonary function test appointment previously scheduled for June 2025 - Do high-resolution CT chest March 2026 - Take ILD questionnaire with you home and email it or bring it back   Nodule of lower lobe of left lung 4mm in fEb 2025 - c/w Benign  Plan  - capture information March 2026 HRCT for ILA   Coronary artery calcification seen on CAT scan   Plan  - keep appt with Dr Addie Holstein in cardiology   Follow-up - Video visit or face-to-face visit 15 minutes in 3 months

## 2023-09-20 NOTE — Progress Notes (Signed)
 OV 09/20/2023  Subjective:  Patient ID: Patty Williams, female , DOB: [birth year] , age 63 y.o. , MRN: [id number] , ADDRESS: [address] [place] [address] PCP Tower, Manley Seeds, MD Patient Care Team: Clemens Curt, MD as PCP - General  This Provider for this visit: Treatment Team:  Attending Provider: Maire Scot, MD    09/20/2023 -   Chief Complaint  Patient presents with   Follow-up    Referred by Dr Rosa College for eval of ILD.  Breathing is currently stable.      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  She saw the practice and now is doing education.  She is originally from [place].  She is concerned that she has ILD.  She says the general American dental Association published study showing that the started high risk for getting ILD.  In addition she quoted Dr. 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  I am aware of the second study.  She also stated that her second risk factor is that her parents suffer/suffered from pulmonary fibrosis.  Her mother died in 10-10-2016 from pulmonary fibrosis at age 50.  Her father age 69 is still alive and lives in the [place] side of [place] and suffers from pulmonary fibrosis details are not much known.  She is essentially asymptomatic although she states that she always feels like she is never gets a good deep breath.  When she had pulmonary function testing the albuterol  inhaler has helped.  She has seen Dr. Linder Revere and she is on albuterol  and Symbicort  for this.  She is also trying to lose weight with Ozempic .  Because of the concern of ILD she did have a high-resolution CT chest in the February 2025.  I personally visualized it and shows interstitial lung abnormalities in my opinion.  She has never had COVID.   There is a 4 mm nodule that I believe is a lymph node  She also has coronary artery calcification.  She had a recent echo where her ejection fraction is low normal.  In the past and slight trace +1: 80.  She is open to get seeing the genetics counselor.  SYMPTOM SCALE - ILD 09/20/2023  Current weight   O2 use ra  Shortness of Breath 0 -> 5 scale with 5 being worst (score 6 If unable to do)  At rest 0  Simple tasks - showers, clothes change, eating, shaving 0  Household (dishes, doing bed, laundry) 00  Shopping 0  Walking level at own pace 0  Walking up Stairs 0  Total (30-36) Dyspnea Score 0  How bad is your cough? 0  How bad is your fatigue 0  How bad is nausea 0  How bad is vomiting?  0  How bad is diarrhea? 0  How bad is anxiety? 0  How bad is depression 00  Any chronic pain - if so where and how bad 0         SIT STAND TEST - goal 15 times   09/20/2023    O2 used ra   PRobe - finter or forehead forhead   Number sit and stand completed - goal 15 15   Time taken to complete 40 sec  Resting Pulse Ox/HR/Dyspnea  99% and 72/min and dyspnea of 1/10    Peak measures 97 % and 102/min and dyspnea of 1/10   Final Pulse Ox/HR 97% and 84/min and dyspnea of 1/10   Desaturated </= 88% no   Desaturated <= 3% points no   Got Tachycardic >/= 90/min yes   Miscellaneous comments x       CT Chest data from date: FEB 2025  - personally visualized and independently interpreted : YES - my findings are: ILD - not ILD arrative & Impression  CLINICAL DATA:  Evaluate for interstitial lung disease, family history, confusional exposure.   EXAM: CT CHEST WITHOUT CONTRAST   TECHNIQUE: Multidetector CT imaging of the chest was performed following the standard protocol without intravenous contrast. High resolution imaging of the lungs, as well as inspiratory and expiratory imaging, was performed.   RADIATION DOSE REDUCTION: This exam was performed according to  the departmental dose-optimization program which includes automated exposure control, adjustment of the mA and/or kV according to patient size and/or use of iterative reconstruction technique.   COMPARISON:  Cardiac CT 03/18/2023 and CT abdomen 10/21/2012.   FINDINGS: Cardiovascular: Minimal atherosclerotic calcification of the aorta with small amount of ulcerative plaque in the aortic arch. Left anterior descending coronary artery calcification. Heart is at the upper limits of normal in size to mildly enlarged. No pericardial effusion.   Mediastinum/Nodes: No pathologically enlarged mediastinal or axillary lymph nodes. Hilar regions are difficult to definitively evaluate without IV contrast. Esophagus is grossly unremarkable.   Lungs/Pleura: Very mild peripheral and basilar intralobular and interlobular lines with ground-glass. No bronchiectasis/bronchiolectasis, architectural distortion or honeycombing. 4 mm juxtapleural nodule in the lateral left lower lobe, considered benign. No suspicious pulmonary nodules. No pleural fluid. Airway is unremarkable. There is air trapping.   Upper Abdomen: Tiny left hepatic lobe cyst. No specific follow-up necessary. Partially imaged 1.6 cm low-attenuation lesion in the head of the pancreas (2/168), present dating back to 10/21/2012 and considered benign. Visualized portions of the liver, gallbladder, adrenal glands, kidneys, spleen, pancreas, stomach and bowel are otherwise grossly unremarkable. No upper abdominal adenopathy.   Musculoskeletal: Degenerative changes in the spine.   IMPRESSION: 1. Very mild peripheral and basilar intralobular and interlobular lines with ground-glass, concerning for early/mild interstitial lung disease. Findings are indeterminate for UIP per consensus guidelines: Diagnosis of Idiopathic Pulmonary Fibrosis: An Official ATS/ERS/JRS/ALAT Clinical Practice Guideline. Am Annie Barton Crit Care Med Vol 198, Iss 5,  [phone], Jan 09 2017. 2. Air trapping is indicative of small airways disease. 3. Left anterior descending coronary artery calcification.     Electronically Signed   By: Shearon Denis M.D.   On: 07/07/2023 11:24    PFT     Latest Ref Rng & Units 01/18/2023    8:03 AM 01/05/2023   11:03 AM 03/27/2022    2:56 PM  PFT Results  FVC-Pre L 3.29  3.11  3.05   FVC-Predicted Pre % 97  92  90   FVC-Post L 3.19  3.12  2.95   FVC-Predicted Post % 94  92  87   Pre FEV1/FVC % % 68  39  86   Post FEV1/FCV % % 59  50  89   FEV1-Pre L 2.23  1.20  2.63   FEV1-Predicted Pre % 85  46  101   FEV1-Post L 1.89  1.57  2.64   DLCO uncorrected ml/min/mmHg 17.51  19.06  18.43   DLCO UNC% % 84  91  88   DLCO corrected ml/min/mmHg 17.51  19.06  18.43   DLCO COR %Predicted % 84  91  88   DLVA Predicted % 89  97  98   TLC L 4.86  5.04  4.94   TLC % Predicted % 93  96  94   RV % Predicted % 73  87  84      Latest Reference Range & Units 02/16/23 11:06  Anti Nuclear Antibody (ANA) NEGATIVE  POSITIVE !  ANA Pattern 1  Nuclear, Nucleolar !  ANA Titer 1 titer 1:80 (H)  RA Latex Turbid. <14 IU/mL <10  !: Data is abnormal (H): Data is abnormally high   LAB RESULTS last 96 hours No results found.    Latest Reference Range & Units 12/16/12 09:58  Plantain kU/L <0.10  Lamb's Quarters kU/L <0.10  Oak kU/L <0.10  G005 Rye, Perennial kU/L <0.10  IgE (Immunoglobulin E), Serum 0.0 - 180.0 IU/mL 71.6  Allergen, D pternoyssinus,d7 kU/L <0.10  Cat Dander kU/L 0.38 (H)  Dog Dander kU/L 0.37 (H)  [place]ia Grass kU/L <0.10  [place] Grass kU/L <0.10  Timothy Grass kU/L <0.10  Aspergillus fumigatus, m3 kU/L <0.10  Elm IgE kU/L <0.10  Sycamore Tree kU/L <0.10  Common Ragweed kU/L <0.10  Curvularia lunata kU/L <0.10  D. farinae kU/L <0.10  Helminthosporium halodes kU/L <0.10  Stemphylium Botryosum kU/L <0.10  Candida albicans kU/L <0.10  Fescue kU/L <0.10  Goldenrod kU/L <0.10  Allergen,Goose feathers,  e70 kU/L <0.10  House Dust [place] kU/L 0.10 (H)  [place] IgE kU/L <0.10  G009 Red Top kU/L <0.10  (H): Data is abnormally high    has a past medical history of Anxiety, Asthma, Back pain, Bilateral ovarian cysts, Bruxism, Constipation, Diabetes mellitus type II, Diverticulosis (2014), Dry mouth, Elevated blood pressure reading without diagnosis of hypertension, Endometriosis, Fibroids, GERD (gastroesophageal reflux disease), History of ovarian cyst, HLD (hyperlipidemia), adenomatous polyp of colon (03/24/2019), Hyperglycemia, Hyperlipidemia, Hypertension, Iron deficiency anemia, Joint pain, Menorrhagia, Obesity, Obesity, OSA on CPAP, Plantar fasciitis, PMB (postmenopausal bleeding), Pneumonia, PONV (postoperative nausea and vomiting), Pre-diabetes, Rectovaginal fistula, Sleep apnea, Uterine adhesion, Uterine fibroid, and Wears glasses.   reports that she has never smoked. She has never used smokeless tobacco.  Past Surgical History:  Procedure Laterality Date   COLONOSCOPY  01/2009   DILATATION & CURETTAGE/HYSTEROSCOPY WITH MYOSURE N/A 09/22/2018   Procedure: DILATATION & CURETTAGE/HYSTEROSCOPY;  Surgeon: Audelia Leaks, MD;  Location: [facility];  Service: Gynecology;  Laterality: N/A;   HEMORRHOID BANDING     HEMORRHOID SURGERY     LAPAROSCOPY     endometriosis   NASAL SEPTOPLASTY W/ TURBINOPLASTY Bilateral 09/02/2021   Procedure: NASAL SEPTOPLASTY WITH SUBMUCOSAL RESECTION OF TURBINATES;  Surgeon: Lesly Raspberry, MD;  Location: [facility];  Service: ENT;  Laterality: Bilateral;   SEPTOPLASTY     april 2023   TONSILLECTOMY AND ADENOIDECTOMY     TRIGGER FINGER RELEASE     UPPER GI ENDOSCOPY  01/2009   uterine biopsy     uterine cyst exploration     UTERINE SUSPENSION     WISDOM TOOTH EXTRACTION      No Known Allergies  Immunization History  Administered Date(s) Administered   Influenza Split 01/10/2012, 01/23/2013   Influenza Whole 03/02/2008, 02/21/2010    Influenza,inj,Quad PF,6+ Mos 01/13/2019, 01/19/2020   Influenza,trivalent, recombinat, inj, PF 02/04/2023   Influenza-Unspecified 01/23/2014, 01/24/2015, 01/30/2021, 01/30/2022   Moderna Covid-19 Fall Seasonal Vaccine 67yrs & older 03/10/2022, 02/04/2023  Moderna Sars-Covid-2 Vaccination 05/11/2019, 06/09/2019, 03/08/2020, 02/03/2021   Pneumococcal Polysaccharide-23 03/02/2008, 05/11/2011, 12/17/2017   Td 06/11/1997   Tdap 05/11/2011, 07/21/2018   Zoster Recombinant(Shingrix) 01/03/2018, 03/30/2018    Family History  Problem Relation Age of Onset   Thyroid  disease Mother        ?   Diabetes Mother    Hypertension Mother    Hyperlipidemia Mother    Anxiety disorder Mother    Sleep apnea Mother    Obesity Mother    Heart disease Mother    Cancer Father        prostate   Hypertension Father    Obesity Father    Prostate cancer Father    Heart attack Other        grandfather (in his 32's)   Parkinsonism Other        grandfater   Stomach cancer Neg Hx    Colon cancer Neg Hx    Esophageal cancer Neg Hx      Current Outpatient Medications:    albuterol  (VENTOLIN  HFA) 108 (90 Base) MCG/ACT inhaler, Inhale 2 puffs into the lungs every 4 (four) hours as needed., Disp: 8.5 g, Rfl: 5   AMBULATORY NON FORMULARY MEDICATION, Single glucometer with lancets, test strips-Check FBS and 2 hour PP(BID), Disp: 1 each, Rfl: 0   budesonide -formoterol  (SYMBICORT ) 80-4.5 MCG/ACT inhaler, USE 2 INHALATIONS TWICE A DAY, Disp: 10.2 g, Rfl: 5   Cholecalciferol (VITAMIN D3) 50 MCG (2000 UT) capsule, 2 po every day ( 4 - 5000 IU every day), Disp: , Rfl:    Coenzyme Q10 (CO Q 10 PO), Take 300 mg by mouth daily., Disp: , Rfl:    docusate sodium (COLACE) 100 MG capsule, Take 100 mg by mouth daily as needed for mild constipation., Disp: , Rfl:    famotidine (PEPCID) 40 MG tablet, Take 40 mg by mouth daily., Disp: , Rfl:    MAGNESIUM GLYCINATE PO, Take 240 mg by mouth in the morning and at bedtime.,  Disp: , Rfl:    Multiple Vitamin (MULTIVITAMIN) tablet, Take 1 tablet by mouth daily., Disp: , Rfl:    naproxen  (NAPROSYN ) 500 MG tablet, Take 1 tablet (500 mg total) by mouth 2 (two) times daily as needed for moderate pain. With a meal, Disp: 60 tablet, Rfl: 3   olmesartan  (BENICAR ) 40 MG tablet, Take 40 mg by mouth daily., Disp: , Rfl:    pantoprazole  (PROTONIX ) 40 MG tablet, Take 1 tablet (40 mg total) by mouth 2 (two) times daily before a meal. Breakfast and supper, Disp: 60 tablet, Rfl: 11   rosuvastatin  (CRESTOR ) 10 MG tablet, Take 1 tablet (10 mg total) by mouth daily., Disp: 90 tablet, Rfl: 3   Semaglutide , 2 MG/DOSE, (OZEMPIC , 2 MG/DOSE,) 8 MG/3ML SOPN, Inject 2 mg into the skin once a week., Disp: 3 mL, Rfl: 0      Objective:   Vitals:   09/20/23 0904  BP: 108/76  Pulse: 77  SpO2: 97%  Weight: 199 lb (90.3 kg)  Height: 5\' 5"  (1.651 m)    Estimated body mass index is 33.12 kg/m as calculated from the following:   Height as of this encounter: 5\' 5"  (1.651 m).   Weight as of this encounter: 199 lb (90.3 kg).  @WEIGHTCHANGE @  Filed Weights   09/20/23 0904  Weight: 199 lb (90.3 kg)     Physical Exam   General: No distress. Looks well O2 at rest: no Cane present: no Sitting in wheel chair: no Frail:  no Obese: no Neuro: Alert and Oriented x 3. GCS 15. Speech normal Psych: Pleasant Resp:  Barrel Chest - no.  Wheeze - no, Crackles - no, No overt respiratory distress CVS: Normal heart sounds. Murmurs - no Ext: Stigmata of Connective Tissue Disease - no HEENT: Normal upper airway. PEERL +. No post nasal drip        Assessment:       ICD-10-CM   1. Family history of pulmonary fibrosis  Z83.6 Ambulatory referral to Genetics    Antinuclear Antib (ANA)    Anti-scleroderma antibody    Sjogrens syndrome-A extractable nuclear antibody    Sjogrens syndrome-B extractable nuclear antibody    CK Total (and CKMB)    Aldolase    QuantiFERON-TB Gold Plus    CT Chest  High Resolution    2. ILD (interstitial lung disease) (HCC)  J84.9 Ambulatory referral to Genetics    Antinuclear Antib (ANA)    Anti-scleroderma antibody    Sjogrens syndrome-A extractable nuclear antibody    Sjogrens syndrome-B extractable nuclear antibody    CK Total (and CKMB)    Aldolase    QuantiFERON-TB Gold Plus    CT Chest High Resolution    3. Nodule of lower lobe of left lung  R91.1 Ambulatory referral to Genetics    Antinuclear Antib (ANA)    Anti-scleroderma antibody    Sjogrens syndrome-A extractable nuclear antibody    Sjogrens syndrome-B extractable nuclear antibody    CK Total (and CKMB)    Aldolase    QuantiFERON-TB Gold Plus    CT Chest High Resolution    4. Coronary artery calcification seen on CAT scan  I25.10 Ambulatory referral to Genetics    Antinuclear Antib (ANA)    Anti-scleroderma antibody    Sjogrens syndrome-A extractable nuclear antibody    Sjogrens syndrome-B extractable nuclear antibody    CK Total (and CKMB)    Aldolase    QuantiFERON-TB Gold Plus    CT Chest High Resolution    5. ANA positive  R76.8 Antinuclear Antib (ANA)    Anti-scleroderma antibody    Sjogrens syndrome-A extractable nuclear antibody    Sjogrens syndrome-B extractable nuclear antibody    CK Total (and CKMB)    Aldolase    QuantiFERON-TB Gold Plus    CT Chest High Resolution         Plan:     Patient Instructions  Family history of pulmonary fibrosis - ILD (interstitial lung disease) (HCC) -  Positive ANA 1: 80 in 2024  - I think of interstitial lung abnormality and heart disease.  Pulmonary function test is normal and you are asymptomatic  Plan - Do risk assessment for future progression  -0 complete serology profile. - Refer to genetics counselor Marijo Shove - Keep pulmonary function test appointment previously scheduled for June 2025 - Do high-resolution CT chest March 2026 - Take ILD questionnaire with you home and email it or bring it  back   Nodule of lower lobe of left lung 4mm in fEb 2025 - c/w Benign  Plan  - capture information March 2026 HRCT for ILA   Coronary artery calcification seen on CAT scan   Plan  - keep appt with Dr Addie Holstein in cardiology   Follow-up - Video visit or face-to-face visit 15 minutes in 3 months   FOLLOWUP Return for 15 min visit, with Dr Bertrum Brodie, Face to Face OR Video Visit.  ( Level 05 visit E&M 2024: Estb >= 40 min n  visit type:  on-site physical face to visit  in total care time and counseling or/and coordination of care by this undersigned MD - Dr Maire Scot. This includes one or more of the following on this same day 09/20/2023: pre-charting, chart review, note writing, documentation discussion of test results, diagnostic or treatment recommendations, prognosis, risks and benefits of management options, instructions, education, compliance or risk-factor reduction. It excludes time spent by the CMA or office staff in the care of the patient. Actual time 45 min)   SIGNATURE    Dr. Maire Scot, M.D., F.C.C.P,  Pulmonary and Critical Care Medicine Staff Physician, [facility] Director - Interstitial Lung Disease  Program  Pulmonary Fibrosis [facility] at [facility] [place], [place], [address]  Pager: [phone], If no answer or between  15:00h - 7:00h: call [phone] Telephone: [phone]  9:44 AM 09/20/2023

## 2023-09-20 NOTE — Telephone Encounter (Signed)
 Pt sent a message saying:  My diastolic has slowly decreased over the weekend. Sunday evening BP was 104/77 and AM was 124/82 Last night(Saturday) 113/78 but in the AM on Saturday it was 124/87. All in all 77-87 for diastolic.  Continue our plan to stay on 40 mg Olmesartan ? Do I need a new Rx? Thank you!!

## 2023-09-22 LAB — ANTI-NUCLEAR AB-TITER (ANA TITER)
ANA TITER: 1:160 {titer} — ABNORMAL HIGH
ANA Titer 1: 1:160 {titer} — ABNORMAL HIGH

## 2023-09-22 LAB — ANA: Anti Nuclear Antibody (ANA): POSITIVE — AB

## 2023-09-22 LAB — QUANTIFERON-TB GOLD PLUS
Mitogen-NIL: 7.38 [IU]/mL
NIL: 0.02 [IU]/mL
QuantiFERON-TB Gold Plus: NEGATIVE
TB1-NIL: 0.02 [IU]/mL
TB2-NIL: 0.01 [IU]/mL

## 2023-09-22 LAB — CK TOTAL AND CKMB (NOT AT ARMC)
CK, MB: 1.6 ng/mL (ref 0–5.0)
Relative Index: 1 (ref 0–4.0)
Total CK: 166 U/L (ref 20–243)

## 2023-09-22 LAB — ANTI-SCLERODERMA ANTIBODY: Scleroderma (Scl-70) (ENA) Antibody, IgG: <1 AI

## 2023-09-22 LAB — ALDOLASE: Aldolase: 4.8 U/L (ref <=8.1)

## 2023-09-22 LAB — SJOGRENS SYNDROME-A EXTRACTABLE NUCLEAR ANTIBODY: SSA (Ro) (ENA) Antibody, IgG: <1 AI

## 2023-09-22 LAB — SJOGRENS SYNDROME-B EXTRACTABLE NUCLEAR ANTIBODY: SSB (La) (ENA) Antibody, IgG: <1 AI

## 2023-10-07 ENCOUNTER — Encounter (INDEPENDENT_AMBULATORY_CARE_PROVIDER_SITE_OTHER): Payer: Self-pay | Admitting: Family Medicine

## 2023-10-07 ENCOUNTER — Ambulatory Visit (INDEPENDENT_AMBULATORY_CARE_PROVIDER_SITE_OTHER): Admitting: Family Medicine

## 2023-10-07 VITALS — BP 111/72 | HR 64 | Temp 97.8°F | Ht 65.0 in | Wt 192.0 lb

## 2023-10-07 DIAGNOSIS — E669 Obesity, unspecified: Secondary | ICD-10-CM

## 2023-10-07 DIAGNOSIS — E785 Hyperlipidemia, unspecified: Secondary | ICD-10-CM

## 2023-10-07 DIAGNOSIS — E119 Type 2 diabetes mellitus without complications: Secondary | ICD-10-CM | POA: Diagnosis not present

## 2023-10-07 DIAGNOSIS — K760 Fatty (change of) liver, not elsewhere classified: Secondary | ICD-10-CM

## 2023-10-07 DIAGNOSIS — E1169 Type 2 diabetes mellitus with other specified complication: Secondary | ICD-10-CM

## 2023-10-07 DIAGNOSIS — Z6831 Body mass index (BMI) 31.0-31.9, adult: Secondary | ICD-10-CM

## 2023-10-07 DIAGNOSIS — Z7985 Long-term (current) use of injectable non-insulin antidiabetic drugs: Secondary | ICD-10-CM

## 2023-10-07 MED ORDER — OZEMPIC (2 MG/DOSE) 8 MG/3ML ~~LOC~~ SOPN: 2.0000 mg | PEN_INJECTOR | SUBCUTANEOUS | 0 refills | Status: DC

## 2023-10-07 NOTE — Progress Notes (Signed)
 Office: [phone]  /  Fax: [phone]  WEIGHT SUMMARY AND BIOMETRICS  Anthropometric Measurements Height: 5\' 5"  (1.651 m) Weight: 192 lb (87.1 kg) BMI (Calculated): 31.95 Weight at Last Visit: 191 lb Weight Lost Since Last Visit: 0 Weight Gained Since Last Visit: 1 lb Starting Weight: 229 lb Total Weight Loss (lbs): 37 lb (16.8 kg) Peak Weight: 232 lb   Body Composition  Body Fat %: 42 % Fat Mass (lbs): 80.8 lbs Muscle Mass (lbs): 105.8 lbs Total Body Water (lbs): 77.4 lbs Visceral Fat Rating : 11   Other Clinical Data Fasting: no Labs: no Today's Visit #: 42 Starting Date: 06/27/20    Chief Complaint: OBESITY   History of Present Illness Patty Williams is a 63 year old female with obesity and type 2 diabetes who presents for obesity treatment and progress assessment.  She is adhering to a category two eating plan approximately 90% of the time and has increased her adherence to 110% since May 18th after attending a motivational speech. Despite these efforts, she has gained one pound since her last visit a month ago. She engages in walking for exercise, 20 minutes five times a week.  Her type 2 diabetes is well-controlled, and she is on Ozempic  1 mg weekly. Recently, she self-adjusted her dose to 29 clicks, down from 39 clicks, for the past week. She wants to decrease her Ozempic  dosage further and seeks guidance on maintaining carbohydrate intake under 100 grams per day.  She has a recent diagnosis of fatty liver, which she suspects may be related to her use of Crestor . She is taking vitamin K2 as recommended by a nutritionist. She is concerned about the implications of her fatty liver and seeks advice on addressing it through diet and lifestyle changes.  She is actively tracking her nutritional intake using the 'Lose It' app and is focused on learning how to balance her macronutrients. She is also reading 'Glucose Revolution' to better understand her dietary  needs.  A positive ANA test was noted, considered mildly positive, with additional tests for scleroderma returning negative results. Her ANA results are not indicative of a specific condition at this time.      PHYSICAL EXAM:  Blood pressure 111/72, pulse 64, temperature 97.8 F (36.6 C), height 5\' 5"  (1.651 m), weight 192 lb (87.1 kg), last menstrual period 10/08/2018, SpO2 99%. Body mass index is 31.95 kg/m.  DIAGNOSTIC DATA REVIEWED:  BMET    Component Value Date/Time   NA 142 08/12/2023 1607   K 4.2 08/12/2023 1607   CL 101 08/12/2023 1607   CO2 23 08/12/2023 1607   GLUCOSE 92 08/12/2023 1607   GLUCOSE 98 03/05/2023 0824   BUN 21 08/12/2023 1607   CREATININE 0.76 08/12/2023 1607   CALCIUM  9.2 08/12/2023 1607   GFRNONAA 98 06/27/2020 0840   GFRAA 113 06/27/2020 0840   Lab Results  Component Value Date   HGBA1C 5.9 (H) 07/08/2023   HGBA1C 6.5 (H) 03/02/2008   Lab Results  Component Value Date   INSULIN  27.3 (H) 07/08/2023   INSULIN  19.9 06/27/2020   Lab Results  Component Value Date   TSH 2.44 03/05/2023   CBC    Component Value Date/Time   WBC 7.2 07/08/2023 1006   WBC 6.3 03/05/2023 0824   RBC 4.30 07/08/2023 1006   RBC 4.33 03/05/2023 0824   HGB 13.4 07/08/2023 1006   HCT 40.1 07/08/2023 1006   PLT 233 07/08/2023 1006   MCV 93 07/08/2023 1006  MCH 31.2 07/08/2023 1006   MCH 28.9 09/23/2018 0404   MCHC 33.4 07/08/2023 1006   MCHC 32.1 03/05/2023 0824   RDW 13.9 07/08/2023 1006   Iron Studies    Component Value Date/Time   IRON 92 07/08/2023 1006   TIBC 382 07/08/2023 1006   FERRITIN 40 07/08/2023 1006   IRONPCTSAT 24 07/08/2023 1006   Lipid Panel     Component Value Date/Time   CHOL 166 07/08/2023 1006   TRIG 50 07/08/2023 1006   HDL 64 07/08/2023 1006   CHOLHDL 4 03/05/2023 0824   VLDL 18.4 03/05/2023 0824   LDLCALC 92 07/08/2023 1006   LDLDIRECT 144.2 09/21/2008 1008   Hepatic Function Panel     Component Value Date/Time   PROT  6.6 08/12/2023 1607   ALBUMIN 4.2 08/12/2023 1607   AST 27 08/12/2023 1607   ALT 35 (H) 08/12/2023 1607   ALKPHOS 73 08/12/2023 1607   BILITOT 0.4 08/12/2023 1607   BILIDIR 0.1 03/02/2008 1018      Component Value Date/Time   TSH 2.44 03/05/2023 0824   Nutritional Lab Results  Component Value Date   VD25OH 67.2 07/08/2023   VD25OH 34.66 03/05/2023   VD25OH 63.3 11/02/2022     Assessment and Plan Assessment & Plan Obesity Obesity with recent weight gain of one pound despite adherence to a category two eating plan and regular exercise. She treats refined sugar consumption as an addiction, aiding in dietary discipline. She is motivated by a personal experience shared at a meeting. - Continue category two eating plan with focus on reducing refined sugars. - Encourage continuation of exercise routine, walking 20 minutes five times a week. - Discuss the importance of maintaining a supportive environment for dietary success.  Metabolic-associated steatotic liver disease (MASLD) MASLD with concerns about fatty liver potentially exacerbated by Crestor . Emphasis on weight loss and dietary management to improve liver health. Ozempic  is beneficial for fatty liver due to its effects on insulin  resistance and weight loss. Discussed the risk of cirrhosis and liver failure, noting that 10% of those with fatty liver develop scarring, and 10% of those with scarring may progress to liver failure. - Continue weight loss efforts to reduce liver fat. - Maintain Ozempic  dosage to support liver health. - Monitor liver function tests regularly.  Type 2 diabetes mellitus Well-controlled type 2 diabetes mellitus managed with Ozempic  1 mg weekly. She has self-adjusted to 29 clicks. Emphasized the role of Ozempic  in managing insulin  resistance and its FDA approval for fatty liver treatment. - Continue Ozempic  at 1 mg weekly (36 clicks) to manage diabetes and support weight loss. - Monitor blood glucose  levels and adjust treatment as necessary. - Discuss potential future reduction of Ozempic  dosage contingent on significant weight loss and stabilization.  Hyperlipidemia Hyperlipidemia managed with Crestor . She is concerned about the impact of Crestor  on fatty liver. Discussed the importance of Crestor  for cardiovascular health despite potential side effects. She is also taking vitamin K2 as recommended by a nutritionist to manage calcium  in blood vessels. - Continue Crestor  as prescribed. - Continue vitamin K2 supplementation as recommended by nutritionist. - Monitor lipid levels and liver function tests.     I have personally spent 43 minutes total time today in preparation, patient care, and documentation for this visit, including the following: review of clinical lab tests; review of medical history, review of pathophysiology of MASLD and education on macronutrient goals for her overall health. Nutritional counseling done today.   She was informed  of the importance of frequent follow up visits to maximize her success with intensive lifestyle modifications for her multiple health conditions.    Jasmine Mesi, MD

## 2023-10-17 NOTE — Progress Notes (Signed)
 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 -Symbicort  80,Ventolin  hfa,  Body weight today-  194 lbs CPAP auto 5-15/ [employer]                AirSense 10 AutoSet   Download compliance-100%, AHI 1.4/hr ------Wearing CPAP-doing well with continued benefit. Discussed the use of AI scribe software for clinical note transcription with the patient, who gave verbal consent to proceed. History of Present Illness   The patient, with a history of mild intermittent asthma and sleep apnea managed with daily Symbicort  and CPAP respectively, presents for a follow-up visit. She reports recent exposure to storage dust and spice inhalation, which aggravated her asthma symptoms. She used her rescue inhaler due to this exacerbation. Rare need for rescue inhaler. Continues daily Symbicort  and feels very well controlled. CPAP download reviewed. She uses it every night and sleeps well. Compliance and control are excellent with no change needed.  The main focus of the visit is the discussion of recent high-resolution CT results. We reviewed radiology report and images.  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 We are referring to our ILD expert to manage this problem.  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    HR CT chest 06/24/23 IMPRESSION: 1. Very mild peripheral and basilar intralobular and interlobular lines with ground-glass, concerning for early/mild interstitial lung disease. Findings are indeterminate for UIP per consensus guidelines: Diagnosis of Idiopathic Pulmonary Fibrosis: An Official ATS/ERS/JRS/ALAT Clinical Practice Guideline. Am JINNY [employer] Crit Care Med Vol 198, Iss 5, [phone], Jan 09 2017. 2. Air trapping is indicative of small airways disease. 3. Left anterior descending coronary artery calcification.   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 -Symbicort  80,Ventolin  hfa,  Body weight today-  CPAP auto 5-15/ [employer]                AirSense 10 AutoSet   Download compliance- 100%, AHI 2/hr (5/7-10/14/23) She saw Dr Geronimo in May for eval of very mild fibrosis, indeterminate for UIP. Plan for video f/u July 31. Download reviewed. She continues very compliant with CPAP with excellent control and no daytime somnolence concerns. Asthma mild intermittent un complicated. Hasn't had any significant exacerbation and hsn't needed to use rescue inhaler in the past year. PFT scheduled for July. FAA forms for asthma and OSA are due end of July, and will need 12 month CPAP compliance download then. Assessment & Plan Assessment and Plan:    Interstitial Lung Disease (ILD) High resolution CT shows very mild peripheral and basilar intralobular and  lines with ground glass, concerning for early mild  ILD. Indeterminate for the specific diagnosis of Usual Interstitial Pneumonia (UIP). No distinctive fine crackles heard on auscultation. -Refered to Dr. Geronimo, a fibrosis specialist, for further evaluation and management.  Asthma Mild  intermittent asthma, well-controlled with daily Symbicort . Recent exacerbation due to environmental triggers (storage smell, spices). -Continue daily Symbicort . -Use rescue inhaler as needed for exacerbations. -Continue to avoid known triggers.  Obstructive Sleep Apnea Well-managed with CPAP. -Continue current CPAP settings.  Follow-up Scheduled for October 18, 2023, for [facility] paperwork. Cancel August 09, 2023, appointment.      ROS-see HPI   + = positive Constitutional:    weight loss, night sweats, fevers, chills, fatigue, lassitude. HEENT:    headaches, difficulty swallowing, tooth/dental problems, sore throat,       sneezing, itching, ear ache, nasal congestion, post nasal drip, snoring CV:    chest pain, orthopnea, PND, swelling in lower extremities, anasarca,                                   dizziness, palpitations Resp:   +shortness of breath with exertion or at rest.                productive cough,   non-productive cough, coughing up of blood.              change in color of mucus.  wheezing.   Skin:    rash or lesions. GI:    +heartburn, indigestion, abdominal pain, nausea, vomiting, diarrhea,                 change in bowel habits, loss of appetite GU: dysuria, change in color of urine, no urgency or frequency,  flank pain. MS:   joint pain, stiffness, decreased range of motion, back pain. Neuro-     nothing unusual Psych:  change in mood or affect.  depression or anxiety.   memory loss.  OBJ- Physical Exam General- Alert, Oriented, Affect-appropriate, Distress- none acute, + overweight Skin- rash-none, lesions- none, excoriation- none Lymphadenopathy- none Head- atraumatic            Eyes- Gross vision intact, PERRLA, conjunctivae and secretions clear            Ears- Hearing, canals-normal            Nose- Clear, no-Septal dev, mucus, polyps, erosion, perforation             Throat- Mallampati II-III , mucosa clear , drainage- none, tonsils- atrophic Neck- flexible ,  trachea midline, no stridor , thyroid  nl, carotid no bruit Chest - symmetrical excursion , unlabored           Heart/CV- RRR , no murmur , no gallop  , no rub, nl s1 s2                           - JVD- none , edema- none, stasis changes- none, varices- none           Lung- clear to P&A- no crackles, wheeze- none, cough- none , dullness-none, rub- none           Chest wall-  Abd-  Br/ Gen/ Rectal- Not done, not indicated Extrem- cyanosis- none, clubbing, none, atrophy- none, strength- nl Neuro- grossly intact to observation

## 2023-10-17 NOTE — Progress Notes (Deleted)
 OV 10/17/2023  Subjective:  Patient ID: Patty Williams, female , DOB: [birth year] , age 63 y.o. , MRN: [id number] , AD[address]ESS: [address] [place] [address] PCP Tower, Manley Seeds, MD Patient Care Team: Tower, Manley Seeds, MD as PCP - General  This Provider for this visit: Treatment Team:  Attending Provider: Faustina Hood, MD    10/17/2023 -   No chief complaint on file.    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  She saw the practice and now is doing education.  She is originally from [place].  She is concerned that she has ILD.  She says the general American dental Association published study showing that the started high risk for getting ILD.  In addition she quoted Dr. 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  I am aware of the second study.  She also stated that her second risk factor is that her parents suffer/suffered from pulmonary fibrosis.  Her mother died in 11-19-16 from pulmonary fibrosis at age 65.  Her father age 40 is still alive and lives in the [place] side of [place] and suffers from pulmonary fibrosis details are not much known.  She is essentially asymptomatic although she states that she always feels like she is never gets a good deep breath.  When she had pulmonary function testing the albuterol  inhaler has helped.  She has seen Dr. Linder Revere and she is on albuterol  and Symbicort  for this.  She is also trying to lose weight with Ozempic .  Because of the concern of ILD she did have a high-resolution CT chest in the February 2025.  I personally visualized it and shows interstitial lung abnormalities in my opinion.  She has never had COVID.  There is a 4 mm nodule that I believe is a lymph node  She also has coronary artery calcification.  She had a  recent echo where her ejection fraction is low normal.  In the past and slight trace +1: 80.  She is open to get seeing the genetics counselor.  SYMPTOM SCALE - ILD 10/17/2023  Current weight   O2 use ra  Shortness of Breath 0 -> 5 scale with 5 being worst (score 6 If unable to do)  At rest 0  Simple tasks - showers, clothes change, eating, shaving 0  Household (dishes, doing bed, laundry) 00  Shopping 0  Walking level at own pace 0  Walking up Stairs 0  Total (30-36) Dyspnea Score 0  How bad is your cough? 0  How bad is your fatigue 0  How bad is nausea 0  How bad is vomiting?  0  How bad is diarrhea? 0  How bad is anxiety? 0  How bad is depression 00  Any chronic pain - if so where and how bad 0         SIT STAND TEST - goal 15 times   10/17/2023    O2 used ra   PRobe - finter or forehead forhead   Number sit and stand completed - goal 15 15   Time taken to complete 40 sec   Resting Pulse Ox/HR/Dyspnea  99% and 72/min and dyspnea of 1/10    Peak measures 97 % and 102/min and dyspnea  of 1/10   Final Pulse Ox/HR 97% and 84/min and dyspnea of 1/10   Desaturated </= 88% no   Desaturated <= 3% points no   Got Tachycardic >/= 90/min yes   Miscellaneous comments x       CT Chest data from date: FEB 2025  - personally visualized and independently interpreted : YES - my findings are: ILD - not ILD arrative & Impression  CLINICAL DATA:  Evaluate for interstitial lung disease, family history, confusional exposure.   EXAM: CT CHEST WITHOUT CONTRAST   TECHNIQUE: Multidetector CT imaging of the chest was performed following the standard protocol without intravenous contrast. High resolution imaging of the lungs, as well as inspiratory and expiratory imaging, was performed.   RADIATION DOSE REDUCTION: This exam was performed according to the departmental dose-optimization program which includes automated exposure control, adjustment of the mA and/or kV according  to patient size and/or use of iterative reconstruction technique.   COMPARISON:  Cardiac CT 03/18/2023 and CT abdomen 10/21/2012.   FINDINGS: Cardiovascular: Minimal atherosclerotic calcification of the aorta with small amount of ulcerative plaque in the aortic arch. Left anterior descending coronary artery calcification. Heart is at the upper limits of normal in size to mildly enlarged. No pericardial effusion.   Mediastinum/Nodes: No pathologically enlarged mediastinal or axillary lymph nodes. Hilar regions are difficult to definitively evaluate without IV contrast. Esophagus is grossly unremarkable.   Lungs/Pleura: Very mild peripheral and basilar intralobular and interlobular lines with ground-glass. No bronchiectasis/bronchiolectasis, architectural distortion or honeycombing. 4 mm juxtapleural nodule in the lateral left lower lobe, considered benign. No suspicious pulmonary nodules. No pleural fluid. Airway is unremarkable. There is air trapping.   Upper Abdomen: Tiny left hepatic lobe cyst. No specific follow-up necessary. Partially imaged 1.6 cm low-attenuation lesion in the head of the pancreas (2/168), present dating back to 10/21/2012 and considered benign. Visualized portions of the liver, gallbladder, adrenal glands, kidneys, spleen, pancreas, stomach and bowel are otherwise grossly unremarkable. No upper abdominal adenopathy.   Musculoskeletal: Degenerative changes in the spine.   IMPRESSION: 1. Very mild peripheral and basilar intralobular and interlobular lines with ground-glass, concerning for early/mild interstitial lung disease. Findings are indeterminate for UIP per consensus guidelines: Diagnosis of Idiopathic Pulmonary Fibrosis: An Official ATS/ERS/JRS/ALAT Clinical Practice Guideline. Am Annie Barton Crit Care Med Vol 198, Iss 5, [phone], Jan 09 2017. 2. Air trapping is indicative of small airways disease. 3. Left anterior descending coronary artery  calcification.     Electronically Signed   By: Shearon Denis M.D.   On: 07/07/2023 11:24    PFT     Latest Ref Rng & Units 01/18/2023    8:03 AM 01/05/2023   11:03 AM 03/27/2022    2:56 PM  PFT Results  FVC-Pre L 3.29  3.11  3.05   FVC-Predicted Pre % 97  92  90   FVC-Post L 3.19  3.12  2.95   FVC-Predicted Post % 94  92  87   Pre FEV1/FVC % % 68  39  86   Post FEV1/FCV % % 59  50  89   FEV1-Pre L 2.23  1.20  2.63   FEV1-Predicted Pre % 85  46  101   FEV1-Post L 1.89  1.57  2.64   DLCO uncorrected ml/min/mmHg 17.51  19.06  18.43   DLCO UNC% % 84  91  88   DLCO corrected ml/min/mmHg 17.51  19.06  18.43   DLCO COR %Predicted % 84  91  88  DLVA Predicted % 89  97  98   TLC L 4.86  5.04  4.94   TLC % Predicted % 93  96  94   RV % Predicted % 73  87  84      Latest Reference Range & Units 02/16/23 11:06  Anti Nuclear Antibody (ANA) NEGATIVE  POSITIVE !  ANA Pattern 1  Nuclear, Nucleolar !  ANA Titer 1 titer 1:80 (H)  RA Latex Turbid. <14 IU/mL <10  !: Data is abnormal (H): Data is abnormally high   LAB RESULTS last 96 hours No results found.    Latest Reference Range & Units 12/16/12 09:58  Plantain kU/L <0.10  Lamb's Quarters kU/L <0.10  Oak kU/L <0.10  G005 Rye, Perennial kU/L <0.10  IgE (Immunoglobulin E), Serum 0.0 - 180.0 IU/mL 71.6  Allergen, D pternoyssinus,d7 kU/L <0.10  Cat Dander kU/L 0.38 (H)  Dog Dander kU/L 0.37 (H)  B[place]ia Grass kU/L <0.10  [place] Grass kU/L <0.10  Timothy Grass kU/L <0.10  Aspergillus fumigatus, m3 kU/L <0.10  Elm IgE kU/L <0.10  Sycamore Tree kU/L <0.10  Common Ragweed kU/L <0.10  Curvularia lunata kU/L <0.10  D. farinae kU/L <0.10  Helminthosporium halodes kU/L <0.10  Stemphylium Botryosum kU/L <0.10  Candida albicans kU/L <0.10  Fescue kU/L <0.10  Goldenrod kU/L <0.10  Allergen,Goose feathers, e70 kU/L <0.10  House Dust [place] kU/L 0.10 (H)  [place] IgE kU/L <0.10  G009 Red Top kU/L <0.10  (H): Data is  abnormally high    has a past medical history of Anxiety, Asthma, Back pain, Bilateral ovarian cysts, Bruxism, Constipation, Diabetes mellitus type II, Diverticulosis (2014), Dry mouth, Elevated blood pressure reading without diagnosis of hypertension, Endometriosis, Fibroids, GERD (gastroesophageal reflux disease), History of ovarian cyst, HLD (hyperlipidemia), adenomatous polyp of colon (03/24/2019), Hyperglycemia, Hyperlipidemia, Hypertension, Iron deficiency anemia, Joint pain, Menorrhagia, Obesity, Obesity, OSA on CPAP, Plantar fasciitis, PMB (postmenopausal bleeding), Pneumonia, PONV (postoperative nausea and vomiting), Pre-diabetes, Rectovaginal fistula, Sleep apnea, Uterine adhesion, Uterine fibroid, and Wears glasses.   reports that she has never smoked. She has never used smokeless tobacco.  Past Surgical History:  Procedure Laterality Date   COLONOSCOPY  01/2009   DILATATION & CURETTAGE/HYSTEROSCOPY WITH MYOSURE N/A 09/22/2018   Procedure: DILATATION & CURETTAGE/HYSTEROSCOPY;  Surgeon: Audelia Leaks, MD;  Location[facility];  Service: Gynecology;  Laterality: N/A;   HEMORRHOID BANDING     HEMORRHOID SURGERY     LAPAROSCOPY     endometriosis   NASAL SEPTOPLASTY W/ TURBINOPLASTY Bilateral 09/02/2021   Procedure: NASAL SEPTOPLASTY WITH SUBMUCOSAL RESECTION OF TURBINATES;  Surgeon: Lesly Raspberry, MD;  Location: [facility];  Service: ENT;  Laterality: Bilateral;   SEPTOPLASTY     april 2023   TONSILLECTOMY AND ADENOIDECTOMY     TRIGGER FINGER RELEASE     UPPER GI ENDOSCOPY  01/2009   uterine biopsy     uterine cyst exploration     UTERINE SUSPENSION     WISDOM TOOTH EXTRACTION      No Known Allergies  Immunization History  Administered Date(s) Administered   Influenza Split 01/10/2012, 01/23/2013   Influenza Whole 03/02/2008, 02/21/2010   Influenza,inj,Quad PF,6+ Mos 01/13/2019, 01/19/2020   Influenza,trivalent, recombinat, inj, PF 02/04/2023    Influenza-Unspecified 01/23/2014, 01/24/2015, 01/30/2021, 01/30/2022   Moderna Covid-19 Fall Seasonal Vaccine 25yrs & older 03/10/2022, 02/04/2023   Moderna Sars-Covid-2 Vaccination 05/11/2019, 06/09/2019, 03/08/2020, 02/03/2021   Pneumococcal Polysaccharide-23 03/02/2008, 05/11/2011, 12/17/2017   Td 06/11/1997   Tdap 05/11/2011, 07/21/2018  Zoster Recombinant(Shingrix) 01/03/2018, 03/30/2018    Family History  Problem Relation Age of Onset   Thyroid  disease Mother        ?   Diabetes Mother    Hypertension Mother    Hyperlipidemia Mother    Anxiety disorder Mother    Sleep apnea Mother    Obesity Mother    Heart disease Mother    Cancer Father        prostate   Hypertension Father    Obesity Father    Prostate cancer Father    Heart attack Other        grandfather (in his 89's)   Parkinsonism Other        grandfater   Stomach cancer Neg Hx    Colon cancer Neg Hx    Esophageal cancer Neg Hx      Current Outpatient Medications:    albuterol  (VENTOLIN  HFA) 108 (90 Base) MCG/ACT inhaler, Inhale 2 puffs into the lungs every 4 (four) hours as needed., Disp: 8.5 g, Rfl: 5   AMBULATORY NON FORMULARY MEDICATION, Single glucometer with lancets, test strips-Check FBS and 2 hour PP(BID), Disp: 1 each, Rfl: 0   budesonide -formoterol  (SYMBICORT ) 80-4.5 MCG/ACT inhaler, USE 2 INHALATIONS TWICE A DAY, Disp: 10.2 g, Rfl: 5   Cholecalciferol (VITAMIN D3) 50 MCG (2000 UT) capsule, 2 po every day ( 4 - 5000 IU every day), Disp: , Rfl:    Coenzyme Q10 (CO Q 10 PO), Take 300 mg by mouth daily., Disp: , Rfl:    docusate sodium (COLACE) 100 MG capsule, Take 100 mg by mouth daily as needed for mild constipation., Disp: , Rfl:    famotidine (PEPCID) 40 MG tablet, Take 40 mg by mouth daily., Disp: , Rfl:    MAGNESIUM GLYCINATE PO, Take 240 mg by mouth in the morning and at bedtime., Disp: , Rfl:    Multiple Vitamin (MULTIVITAMIN) tablet, Take 1 tablet by mouth daily., Disp: , Rfl:    naproxen   (NAPROSYN ) 500 MG tablet, Take 1 tablet (500 mg total) by mouth 2 (two) times daily as needed for moderate pain. With a meal, Disp: 60 tablet, Rfl: 3   olmesartan  (BENICAR ) 40 MG tablet, Take 1 tablet (40 mg total) by mouth daily., Disp: 90 tablet, Rfl: 3   pantoprazole  (PROTONIX ) 40 MG tablet, Take 1 tablet (40 mg total) by mouth 2 (two) times daily before a meal. Breakfast and supper, Disp: 60 tablet, Rfl: 11   rosuvastatin  (CRESTOR ) 10 MG tablet, Take 1 tablet (10 mg total) by mouth daily., Disp: 90 tablet, Rfl: 3   Semaglutide , 2 MG/DOSE, (OZEMPIC , 2 MG/DOSE,) 8 MG/3ML SOPN, Inject 2 mg into the skin once a week., Disp: 3 mL, Rfl: 0      Objective:   There were no vitals filed for this visit.   Estimated body mass index is 31.95 kg/m as calculated from the following:   Height as of 10/07/23: 5\' 5"  (1.651 m).   Weight as of 10/07/23: 192 lb (87.1 kg).  @WEIGHTCHANGE @  There were no vitals filed for this visit.    Physical Exam   General: No distress. Looks well O2 at rest: no Cane present: no Sitting in wheel chair: no Frail: no Obese: no Neuro: Alert and Oriented x 3. GCS 15. Speech normal Psych: Pleasant Resp:  Barrel Chest - no.  Wheeze - no, Crackles - no, No overt respiratory distress CVS: Normal heart sounds. Murmurs - no Ext: Stigmata of Connective Tissue Disease - no  HEENT: Normal upper airway. PEERL +. No post nasal drip        Assessment:     No diagnosis found.      Plan:     There are no Patient Instructions on file for this visit.   FOLLOWUP No follow-ups on file.  ( Level 05 visit E&M 2024: Estb >= 40 min n  visit type: on-site physical face to visit  in total care time and counseling or/and coordination of care by this undersigned MD - Dr Maire Scot. This includes one or more of the following on this same day 10/17/2023: pre-charting, chart review, note writing, documentation discussion of test results, diagnostic or treatment  recommendations, prognosis, risks and benefits of management options, instructions, education, compliance or risk-factor reduction. It excludes time spent by the CMA or office staff in the care of the patient. Actual time 45 min)   SIGNATURE    Dr. Maire Scot, M.D., F.C.C.P,  Pulmonary and Critical Care Medicine Staff Physician, [facility] Director - Interstitial Lung Disease  Program  Pulmonary Fibrosis [facility] at [facility] [place], [place], [address]  Pager: [phone], If no answer or between  15:00h - 7:00h: call [phone] Telephone: [phone]  8:32 PM 10/17/2023

## 2023-10-18 ENCOUNTER — Encounter: Payer: Self-pay | Admitting: Internal Medicine

## 2023-10-18 ENCOUNTER — Ambulatory Visit: Payer: BC Managed Care – PPO | Admitting: Internal Medicine

## 2023-10-18 VITALS — BP 114/70 | HR 68 | Temp 98.5°F | Ht 65.0 in | Wt 195.6 lb

## 2023-10-18 DIAGNOSIS — G4733 Obstructive sleep apnea (adult) (pediatric): Secondary | ICD-10-CM | POA: Diagnosis not present

## 2023-10-18 NOTE — Patient Instructions (Signed)
 We will put together your 12 month CPAP compliance and the PFT results in July for FAA. Keep your follow-up with Dr Bertrum Brodie as planned.   Anticipating follow-up for our issues in a year, we could arrange it at our [place] office if you like.

## 2023-10-19 ENCOUNTER — Encounter: Payer: Self-pay | Admitting: Family Medicine

## 2023-10-19 ENCOUNTER — Other Ambulatory Visit: Payer: Self-pay

## 2023-10-19 ENCOUNTER — Ambulatory Visit: Admitting: Family Medicine

## 2023-10-19 VITALS — BP 102/80 | HR 74 | Ht 65.0 in | Wt 179.0 lb

## 2023-10-19 DIAGNOSIS — M25511 Pain in right shoulder: Secondary | ICD-10-CM | POA: Diagnosis not present

## 2023-10-19 DIAGNOSIS — G8929 Other chronic pain: Secondary | ICD-10-CM | POA: Diagnosis not present

## 2023-10-19 NOTE — Progress Notes (Unsigned)
   I, Miquel Amen, CMA acting as a scribe for Garlan Juniper, MD.  Wava Kildow is a 63 y.o. female who presents to [facility] today for R shoulder pain. Pt was previously seen by Dr. Alease Hunter for SI joint pain and bilat rhomboid trigger points.  Today, pt c/o R shoulder pain x 1 year, worsening over the past 1-2 months after bumping the shoulder. Pt locates pain to anterior aspect of the shoulder, deep. Sx worse with lateral raises, limited ROM when reaching across the body. Avoid NSAID.   Radiates: localized Aggravates: lateral raises Treatments tried: cupping  Pertinent review of systems: No fevers or chills  Relevant historical information: Hypertension and diabetes   Exam:  BP 102/80   Pulse 74   Ht 5' 5 (1.651 m)   Wt 179 lb (81.2 kg)   LMP 10/08/2018 (Exact Date)   SpO2 96%   BMI 29.79 kg/m  General: Well Developed, well nourished, and in no acute distress.   MSK: Right shoulder normal-appearing normal motion pain with abduction. Intact strength. Positive Hawkins and Neer's test. Negative Yergason's and speeds test. Positive crossover arm compression test.    Lab and Radiology Results  Procedure: Real-time Ultrasound Guided Injection of the right shoulder subacromial bursa Device: Philips Affiniti 50G/GE Logiq Images permanently stored and available for review in PACS Verbal informed consent obtained.  Discussed risks and benefits of procedure. Warned about infection, bleeding, hyperglycemia damage to structures among others. Patient expresses understanding and agreement Time-out conducted.   Noted no overlying erythema, induration, or other signs of local infection.   Skin prepped in a sterile fashion.   Local anesthesia: Topical Ethyl chloride.   With sterile technique and under real time ultrasound guidance: 40 mg of Kenalog  and 2 mL of Marcaine  injected into subacromial bursa. Fluid seen entering the bursa.   Completed without  difficulty   Pain immediately resolved suggesting accurate placement of the medication.   Advised to call if fevers/chills, erythema, induration, drainage, or persistent bleeding.   Images permanently stored and available for review in the ultrasound unit.  Impression: Technically successful ultrasound guided injection.        Assessment and Plan: 63 y.o. female with right shoulder pain due to subacromial impingement and bursitis.  Plan for subacromial injection and home exercise program.  Consider physical therapy if needed.  Recheck as needed.   PDMP not reviewed this encounter. Orders Placed This Encounter  Procedures   US  LIMITED JOINT SPACE STRUCTURES UP RIGHT(NO LINKED CHARGES)    Reason for Exam (SYMPTOM  OR DIAGNOSIS REQUIRED):   right shoulder pain    Preferred imaging location?:   [facility]   No orders of the defined types were placed in this encounter.    Discussed warning signs or symptoms. Please see discharge instructions. Patient expresses understanding.   The above documentation has been reviewed and is accurate and complete Garlan Juniper, M.D.

## 2023-10-19 NOTE — Patient Instructions (Addendum)
 Thank you for coming in today.   You received an injection today. Seek immediate medical attention if the joint becomes red, extremely painful, or is oozing fluid.   Check out YouTube videos for home exercises for Subacromial Bursitis.   See you back as needed.

## 2023-11-02 ENCOUNTER — Encounter (INDEPENDENT_AMBULATORY_CARE_PROVIDER_SITE_OTHER): Payer: Self-pay | Admitting: Family Medicine

## 2023-11-02 ENCOUNTER — Ambulatory Visit (INDEPENDENT_AMBULATORY_CARE_PROVIDER_SITE_OTHER): Admitting: Family Medicine

## 2023-11-02 VITALS — BP 115/70 | HR 72 | Temp 97.8°F | Ht 65.0 in | Wt 185.0 lb

## 2023-11-02 DIAGNOSIS — Z6830 Body mass index (BMI) 30.0-30.9, adult: Secondary | ICD-10-CM

## 2023-11-02 DIAGNOSIS — E669 Obesity, unspecified: Secondary | ICD-10-CM | POA: Diagnosis not present

## 2023-11-02 DIAGNOSIS — E1169 Type 2 diabetes mellitus with other specified complication: Secondary | ICD-10-CM | POA: Diagnosis not present

## 2023-11-02 DIAGNOSIS — E66811 Obesity, class 1: Secondary | ICD-10-CM

## 2023-11-02 DIAGNOSIS — K760 Fatty (change of) liver, not elsewhere classified: Secondary | ICD-10-CM

## 2023-11-02 DIAGNOSIS — Z7984 Long term (current) use of oral hypoglycemic drugs: Secondary | ICD-10-CM

## 2023-11-02 MED ORDER — OZEMPIC (2 MG/DOSE) 8 MG/3ML ~~LOC~~ SOPN: 2.0000 mg | PEN_INJECTOR | SUBCUTANEOUS | 0 refills | Status: DC

## 2023-11-02 NOTE — Progress Notes (Signed)
 Patty Williams, D.O.  ABFM, ABOM Specializing in Clinical Bariatric Medicine  Office located at: [address] [place], [place]  [address]   Assessment and Plan:   Medications Discontinued During This Encounter  Medication Reason   Semaglutide , 2 MG/DOSE, (OZEMPIC , 2 MG/DOSE,) 8 MG/3ML SOPN Reorder     Meds ordered this encounter  Medications   Semaglutide , 2 MG/DOSE, (OZEMPIC , 2 MG/DOSE,) 8 MG/3ML SOPN    Sig: Inject 2 mg into the skin once a week.    Dispense:  3 mL    Refill:  0    Not to be filled until 7/24 or so     FOR THE DISEASE OF OBESITY:  Obesity (BMI 30.0-34.9) -current BM 30.79 Obesity, Beginning BMI 38.11 Assessment & Plan: Since last office visit on 10/07/2023 patient's muscle mass has decreased by 2.2 lb. Fat mass has decreased by 4.8 lb. Total body water has decreased by 1 lb.  Counseling done on how various foods will affect these numbers and how to maximize success  Total lbs lost to date: 42 lbs  Total weight loss percentage to date: 19.21%    Recommended Dietary Goals Patty Williams is currently in the action stage of change. As such, her goal is to continue weight management plan.  She has agreed to: continue current plan   Behavioral Intervention We discussed the following today: protein content of regular egg vs egg whites, healthy spaghetti alternatives, increasing lean protein intake to established goals, increasing lower glycemic fruits, work on meal planning and preparation, and using GPT or another AI platform for recipe ideas- searching low calorie, low carb, high protein chicken recipes etc, decreasing consumption of processed foods, and creating time for self-care.   Additional resources provided today: Handout on balanced plate concepts.  , Handout on complex carbohydrates and lean sources of protein, Handout on reduced calorie nutrition plan concepts, and Handout on Examples of Low Glycemic Index and Low Calorie Fruits &  Vegetables  Evidence-based interventions for health behavior change were utilized today including the discussion of self monitoring techniques, problem-solving barriers and SMART goal setting techniques.   Regarding patient's less desirable eating habits and patterns, we employed the technique of small changes.   Pt will specifically work on: n/a   Recommended Physical Activity Goals Patty Williams has been advised to work up to 300-450 minutes of moderate intensity aerobic activity a week and strengthening exercises 2-3 times per week for cardiovascular health, weight loss maintenance and preservation of muscle mass.   Continue walking regimen. Strongly encouraged resistance training with a goal of 3 sessions a week.    Pharmacotherapy Continue same regimen.    ASSOCIATED CONDITIONS ADDRESSED TODAY:   Type 2 diabetes mellitus with obesity [facility]) Assessment & Plan: Lab Results  Component Value Date   HGBA1C 5.9 (H) 07/08/2023   HGBA1C 5.9 03/05/2023   HGBA1C 5.7 (H) 11/02/2022   INSULIN  27.3 (H) 07/08/2023   INSULIN  10.3 07/02/2022   INSULIN  19.5 12/03/2021    HgbA1c is at goal for age and comorbid conditions. Denies symptoms of hypoglycemia or hyperglycemia. On Semaglutide  2 mg wkly with good adherence and tolerance. Good control of hunger and cravings. Has been mindful of cutting back on refined sugars.  Continue GLP-1 therapy and  balanced diet focusing on protein, fruits, and vegetables while limiting simple carbohydrates. Losing 10% or more of body weight may improve condition. Advance exercise as able; recommend strength training 2-3 days/week.     Metabolic dysfunction-associated steatotic liver disease (  MASLD) Assessment & Plan:    Component Value Date/Time   PROT 6.6 08/12/2023 1607   ALBUMIN 4.2 08/12/2023 1607   AST 27 08/12/2023 1607   ALT 35 (H) 08/12/2023 1607   ALKPHOS 73 08/12/2023 1607   BILITOT 0.4 08/12/2023 1607   BILIDIR 0.1 03/02/2008 1018    Per hx. We  discussed reducing saturated fats as well as simple and added sugars in their diet. Continue GLP-1 therapy. Losing 5-10% of body weight may also improve condition.     Follow up:   Return 12/02/2023 at 9:00 AM. She was informed of the importance of frequent follow up visits to maximize her success with intensive lifestyle modifications for her multiple health conditions.   Subjective:   Chief complaint: Obesity Patty Williams is here to discuss her progress with her obesity treatment plan. She is on the Category 2 Plan and states she is following her eating plan approximately 80% of the time. She states she is walking 20 minutes 4 days per week + 1 hr of pilates 1 day a week  Interval History:  Patty Williams is here for a follow up office visit. Since last OV on 10/07/2023, Patty Williams is down 7 lbs. Is reading Glucose Revolution book and has been very mindful of cutting out refined sugars in her diet. Eating 100+ grams protein daily; has one protein shake a day.     Pharmacotherapy for weight loss: She is currently taking Semaglutide  2 mg wkly.    Review of Systems:  Pertinent positives were addressed with patient today.  Reviewed by clinician on day of visit: allergies, medications, problem list, medical history, surgical history, family history, social history, and previous encounter notes.   Weight Summary and Biometrics   Weight Lost Since Last Visit: 7lb  Weight Gained Since Last Visit: 0lb   Vitals Temp: 97.8 F (36.6 C) BP: 115/70 Pulse Rate: 72 SpO2: 97 %   Anthropometric Measurements Height: 5' 5 (1.651 m) Weight: 185 lb (83.9 kg) BMI (Calculated): 30.79 Weight at Last Visit: 192lb Weight Lost Since Last Visit: 7lb Weight Gained Since Last Visit: 0lb Starting Weight: 229lb Total Weight Loss (lbs): 42 lb (19.1 kg) Peak Weight: 232lb   Body Composition  Body Fat %: 41 % Fat Mass (lbs): 76 lbs Muscle Mass (lbs): 103.6 lbs Total Body Water (lbs): 76.4  lbs Visceral Fat Rating : 11   Other Clinical Data Fasting: No Labs: no Today's Visit #: 43 Starting Date: 06/27/20    Objective:   PHYSICAL EXAM: Blood pressure 115/70, pulse 72, temperature 97.8 F (36.6 C), height 5' 5 (1.651 m), weight 185 lb (83.9 kg), last menstrual period 10/08/2018, SpO2 97%. Body mass index is 30.79 kg/m.  General: she is overweight, cooperative and in no acute distress. PSYCH: Has normal mood, affect and thought process.   HEENT: EOMI, sclerae are anicteric. Lungs: Normal breathing effort, no conversational dyspnea. Extremities: Moves * 4 Neurologic: A and O * 3, good insight  DIAGNOSTIC DATA REVIEWED: BMET    Component Value Date/Time   NA 142 08/12/2023 1607   K 4.2 08/12/2023 1607   CL 101 08/12/2023 1607   CO2 23 08/12/2023 1607   GLUCOSE 92 08/12/2023 1607   GLUCOSE 98 03/05/2023 0824   BUN 21 08/12/2023 1607   CREATININE 0.76 08/12/2023 1607   CALCIUM  9.2 08/12/2023 1607   GFRNONAA 98 06/27/2020 0840   GFRAA 113 06/27/2020 0840   Lab Results  Component Value Date   HGBA1C 5.9 (H) 07/08/2023  HGBA1C 6.5 (H) 03/02/2008   Lab Results  Component Value Date   INSULIN  27.3 (H) 07/08/2023   INSULIN  19.9 06/27/2020   Lab Results  Component Value Date   TSH 2.44 03/05/2023   CBC    Component Value Date/Time   WBC 7.2 07/08/2023 1006   WBC 6.3 03/05/2023 0824   RBC 4.30 07/08/2023 1006   RBC 4.33 03/05/2023 0824   HGB 13.4 07/08/2023 1006   HCT 40.1 07/08/2023 1006   PLT 233 07/08/2023 1006   MCV 93 07/08/2023 1006   MCH 31.2 07/08/2023 1006   MCH 28.9 09/23/2018 0404   MCHC 33.4 07/08/2023 1006   MCHC 32.1 03/05/2023 0824   RDW 13.9 07/08/2023 1006   Iron Studies    Component Value Date/Time   IRON 92 07/08/2023 1006   TIBC 382 07/08/2023 1006   FERRITIN 40 07/08/2023 1006   IRONPCTSAT 24 07/08/2023 1006   Lipid Panel     Component Value Date/Time   CHOL 166 07/08/2023 1006   TRIG 50 07/08/2023 1006   HDL  64 07/08/2023 1006   CHOLHDL 4 03/05/2023 0824   VLDL 18.4 03/05/2023 0824   LDLCALC 92 07/08/2023 1006   LDLDIRECT 144.2 09/21/2008 1008   Hepatic Function Panel     Component Value Date/Time   PROT 6.6 08/12/2023 1607   ALBUMIN 4.2 08/12/2023 1607   AST 27 08/12/2023 1607   ALT 35 (H) 08/12/2023 1607   ALKPHOS 73 08/12/2023 1607   BILITOT 0.4 08/12/2023 1607   BILIDIR 0.1 03/02/2008 1018      Component Value Date/Time   TSH 2.44 03/05/2023 0824   Nutritional Lab Results  Component Value Date   VD25OH 67.2 07/08/2023   VD25OH 34.66 03/05/2023   VD25OH 63.3 11/02/2022    Attestations:   I, Special Puri, acting as a Stage manager for [employer], DO., have compiled all relevant documentation for today's office visit on behalf of Patty Jenkins, DO, while in the presence of [employer], DO.  Reviewed by clinician on day of visit: allergies, medications, problem list, medical history, surgical history, family history, social history, and previous encounter notes pertinent to patient's obesity diagnosis.  I have spent 52 minutes in the care of the patient today including 47 minutes face-to-face assessing/reviewing listed medical problems above as outlined in office visit note, ordering medications, and providing very extensive nutritional and behavioral counseling as outlined in obesity care plan.  I have reviewed the above documentation for accuracy and completeness, and I agree with the above. Patty JINNY Williams, D.O.  The 21st Century Cures Act was signed into law in 2016 which includes the topic of electronic health records.  This provides immediate access to information in MyChart.  This includes consultation notes, operative notes, office notes, lab results and pathology reports.  If you have any questions about what you read please let us  know at your next visit so we can discuss your concerns and take corrective action if need be.  We are right here with you.

## 2023-11-14 ENCOUNTER — Encounter: Payer: Self-pay | Admitting: Internal Medicine

## 2023-11-16 ENCOUNTER — Encounter

## 2023-11-16 ENCOUNTER — Telehealth: Payer: Self-pay | Admitting: *Deleted

## 2023-11-16 ENCOUNTER — Encounter: Payer: Self-pay | Admitting: Family Medicine

## 2023-11-16 NOTE — Telephone Encounter (Signed)
 Pt sent a mychart message saying:  Hello Dr Randeen. I tested positive for Covid yesterday morning (exposed on Thursday evening). I am symptomatic, mainly stuffy sinuses. Currently no cough and I am taking my Symbicort  one puff spread out over 4-5 times a day. Do I need to do anything else such as a virtual visit for Paxlovid? Or not? I heard there is a rebound effect etc. I am diligent about DayQuil and NyQuil. Your advice on the next step if any and what to watch for would be appreciated.

## 2023-11-16 NOTE — Telephone Encounter (Signed)
 I'm glad symptoms are mild and hopefully they will not get bad -thanks for letting us  know  I am out of the office/out of town   A virtual visit is a good idea if there is availability

## 2023-11-16 NOTE — Telephone Encounter (Signed)
Sent mychart letting pt know Dr. Tower's comments. 

## 2023-11-17 NOTE — Telephone Encounter (Signed)
 Please schedule virtual visit with any available provider for Covid + pt

## 2023-11-18 ENCOUNTER — Ambulatory Visit: Admitting: Cardiology

## 2023-11-18 ENCOUNTER — Encounter: Payer: Self-pay | Admitting: Internal Medicine

## 2023-11-18 ENCOUNTER — Telehealth: Admitting: Internal Medicine

## 2023-11-18 VITALS — Temp 98.7°F | Ht 65.0 in | Wt 185.0 lb

## 2023-11-18 DIAGNOSIS — U071 COVID-19: Secondary | ICD-10-CM

## 2023-11-18 NOTE — Progress Notes (Signed)
 Subjective:    Patient ID: Patty Williams, female    DOB: [birth year], 63 y.o.   MRN: [id number]  HPI Video virtual visit due to COVID infection Identification done Reviewed limitation and billing and she gave consent Participants --patient in her home and I am in my office  Symptoms started 5-6 days ago Started with chest tightness and headace Tested negative at first--then positive 3 days ago Was exposed 2 days before her symptoms  Brief low grade fever---but taking nyquil/dayquil every 4 hours Brief chlls/sweats at first--along with muscle aches No wheezing or SOB Tightness in chest was only for the first day Has increased the symbicort  with the illness Only slight cough  No sore throat now Some ear fullness  Current Outpatient Medications on File Prior to Visit  Medication Sig Dispense Refill   albuterol  (VENTOLIN  HFA) 108 (90 Base) MCG/ACT inhaler Inhale 2 puffs into the lungs every 4 (four) hours as needed. 8.5 g 5   AMBULATORY NON FORMULARY MEDICATION Single glucometer with lancets, test strips-Check FBS and 2 hour PP(BID) 1 each 0   budesonide -formoterol  (SYMBICORT ) 80-4.5 MCG/ACT inhaler USE 2 INHALATIONS TWICE A DAY 10.2 g 5   Cholecalciferol (VITAMIN D3) 50 MCG (2000 UT) capsule 2 po every day ( 4 - 5000 IU every day)     Coenzyme Q10 (CO Q 10 PO) Take 300 mg by mouth daily.     docusate sodium (COLACE) 100 MG capsule Take 100 mg by mouth daily as needed for mild constipation.     famotidine (PEPCID) 40 MG tablet Take 40 mg by mouth daily.     MAGNESIUM GLYCINATE PO Take 240 mg by mouth in the morning and at bedtime.     Multiple Vitamin (MULTIVITAMIN) tablet Take 1 tablet by mouth daily.     naproxen  (NAPROSYN ) 500 MG tablet Take 1 tablet (500 mg total) by mouth 2 (two) times daily as needed for moderate pain. With a meal 60 tablet 3   olmesartan  (BENICAR ) 40 MG tablet Take 1 tablet (40 mg total) by mouth daily. 90 tablet 3   pantoprazole  (PROTONIX ) 40 MG tablet  Take 1 tablet (40 mg total) by mouth 2 (two) times daily before a meal. Breakfast and supper 60 tablet 11   rosuvastatin  (CRESTOR ) 10 MG tablet Take 1 tablet (10 mg total) by mouth daily. 90 tablet 3   Semaglutide , 2 MG/DOSE, (OZEMPIC , 2 MG/DOSE,) 8 MG/3ML SOPN Inject 2 mg into the skin once a week. 3 mL 0   No current facility-administered medications on file prior to visit.    No Known Allergies  Past Medical History:  Diagnosis Date   Anxiety    Asthma    Back pain    Bilateral ovarian cysts    Bruxism    Constipation    Diabetes mellitus type II    mild   Diverticulosis 2014   Mild   Dry mouth    Elevated blood pressure reading without diagnosis of hypertension    Endometriosis    Severe   Fibroids    GERD (gastroesophageal reflux disease)    History of ovarian cyst    HLD (hyperlipidemia)    Hx of adenomatous polyp of colon 03/24/2019   Hyperglycemia    Hyperlipidemia    Hypertension    Iron deficiency anemia    Joint pain    Menorrhagia    Obesity    Obesity    OSA on CPAP    Plantar fasciitis    with  orthotics-much difficulty adjusting these   PMB (postmenopausal bleeding)    Pneumonia    history of   PONV (postoperative nausea and vomiting)    Pre-diabetes    Rectovaginal fistula    Sleep apnea    Uterine adhesion    Uterine fibroid    Wears glasses     Past Surgical History:  Procedure Laterality Date   COLONOSCOPY  01/2009   DILATATION & CURETTAGE/HYSTEROSCOPY WITH MYOSURE N/A 09/22/2018   Procedure: DILATATION & CURETTAGE/HYSTEROSCOPY;  Surgeon: Kandyce Sor, MD;  Location: [facility] [place];  Service: Gynecology;  Laterality: N/A;   HEMORRHOID BANDING     HEMORRHOID SURGERY     LAPAROSCOPY     endometriosis   NASAL SEPTOPLASTY W/ TURBINOPLASTY Bilateral 09/02/2021   Procedure: NASAL SEPTOPLASTY WITH SUBMUCOSAL RESECTION OF TURBINATES;  Surgeon: Herminio Miu, MD;  Location: [facility];  Service: ENT;  Laterality: Bilateral;    SEPTOPLASTY     april 2023   TONSILLECTOMY AND ADENOIDECTOMY     TRIGGER FINGER RELEASE     UPPER GI ENDOSCOPY  01/2009   uterine biopsy     uterine cyst exploration     UTERINE SUSPENSION     WISDOM TOOTH EXTRACTION      Family History  Problem Relation Age of Onset   Thyroid  disease Mother        ?   Diabetes Mother    Hypertension Mother    Hyperlipidemia Mother    Anxiety disorder Mother    Sleep apnea Mother    Obesity Mother    Heart disease Mother    Cancer Father        prostate   Hypertension Father    Obesity Father    Prostate cancer Father    Heart attack Other        grandfather (in his 47's)   Parkinsonism Other        grandfater   Stomach cancer Neg Hx    Colon cancer Neg Hx    Esophageal cancer Neg Hx     Social History   Socioeconomic History   Marital status: Married    Spouse name: Delon   Number of children: 0   Years of education: Not on file   Highest education level: Professional school degree (e.g., MD, DDS, DVM, JD)  Occupational History   Occupation: Dentist    Comment: Solo Practitioner  Tobacco Use   Smoking status: Never   Smokeless tobacco: Never  Vaping Use   Vaping status: Never Used  Substance and Sexual Activity   Alcohol use: Not Currently    Comment: occasionally   Drug use: No   Sexual activity: Not Currently    Partners: Female    Birth control/protection: Post-menopausal  Other Topics Concern   Not on file  Social History Narrative   Permanent life partner/wife-female   She is a Education officer, community   Occasional alcohol no tobacco or drug use   Social Drivers of Corporate investment banker Strain: Low Risk  (07/20/2023)   Overall Financial Resource Strain (CARDIA)    Difficulty of Paying Living Expenses: Not hard at all  Food Insecurity: No Food Insecurity (07/20/2023)   Hunger Vital Sign    Worried About Running Out of Food in the Last Year: Never true    Ran Out of Food in the Last Year: Never true  Transportation  Needs: No Transportation Needs (07/20/2023)   PRAPARE - Administrator, Civil Service (Medical): No  Lack of Transportation (Non-Medical): No  Physical Activity: Insufficiently Active (07/20/2023)   Exercise Vital Sign    Days of Exercise per Week: 3 days    Minutes of Exercise per Session: 20 min  Stress: No Stress Concern Present (07/20/2023)   [employer] of Occupational Health - Occupational Stress Questionnaire    Feeling of Stress : Not at all  Social Connections: Unknown (07/20/2023)   Social Connection and Isolation Panel    Frequency of Communication with Friends and Family: Once a week    Frequency of Social Gatherings with Friends and Family: Patient declined    Attends Religious Services: More than 4 times per year    Active Member of [employer] or Organizations: Yes    Attends Engineer, structural: More than 4 times per year    Marital Status: Married  Catering manager Violence: Unknown (09/15/2021)   Received from [facility]   HITS    Physically Hurt: Not on file    Insult or Talk Down To: Not on file    Threaten Physical Harm: Not on file    Scream or Curse: Not on file   Review of Systems No loss of smell or taste Slight nausea--but no vomiting Able to eat    Objective:   Physical Exam Constitutional:      Appearance: Normal appearance.  Pulmonary:     Effort: Pulmonary effort is normal. No respiratory distress.  Neurological:     Mental Status: She is alert.            Assessment & Plan:

## 2023-11-18 NOTE — Assessment & Plan Note (Signed)
 Already 6 days out---so outside window for paxlovid Discussed ongoing symptomatic care Asthma has been controlled with extra symbicort --will wean back down slowly. If worsens, would give brief prednisone  course Okay to return to work next week if feeling back to normal (with mask)

## 2023-11-19 ENCOUNTER — Encounter: Payer: Self-pay | Admitting: Internal Medicine

## 2023-11-19 DIAGNOSIS — G4733 Obstructive sleep apnea (adult) (pediatric): Secondary | ICD-10-CM | POA: Diagnosis not present

## 2023-11-26 ENCOUNTER — Inpatient Hospital Stay: Attending: Nurse Practitioner | Admitting: Genetic Counselor

## 2023-11-26 ENCOUNTER — Other Ambulatory Visit

## 2023-11-26 ENCOUNTER — Encounter: Payer: Self-pay | Admitting: Genetic Counselor

## 2023-11-26 DIAGNOSIS — Z836 Family history of other diseases of the respiratory system: Secondary | ICD-10-CM | POA: Insufficient documentation

## 2023-11-26 DIAGNOSIS — J849 Interstitial pulmonary disease, unspecified: Secondary | ICD-10-CM | POA: Diagnosis not present

## 2023-11-26 DIAGNOSIS — Z8042 Family history of malignant neoplasm of prostate: Secondary | ICD-10-CM | POA: Insufficient documentation
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
## 2023-11-29 ENCOUNTER — Ambulatory Visit: Admitting: Family Medicine

## 2023-11-29 ENCOUNTER — Encounter: Payer: Self-pay | Admitting: Family Medicine

## 2023-11-29 VITALS — BP 124/82 | HR 62 | Temp 98.4°F | Ht 65.0 in | Wt 190.2 lb

## 2023-11-29 DIAGNOSIS — K219 Gastro-esophageal reflux disease without esophagitis: Secondary | ICD-10-CM | POA: Diagnosis not present

## 2023-11-29 DIAGNOSIS — E119 Type 2 diabetes mellitus without complications: Secondary | ICD-10-CM | POA: Diagnosis not present

## 2023-11-29 DIAGNOSIS — J4521 Mild intermittent asthma with (acute) exacerbation: Secondary | ICD-10-CM | POA: Diagnosis not present

## 2023-11-29 DIAGNOSIS — M519 Unspecified thoracic, thoracolumbar and lumbosacral intervertebral disc disorder: Secondary | ICD-10-CM | POA: Diagnosis not present

## 2023-11-29 DIAGNOSIS — I10 Essential (primary) hypertension: Secondary | ICD-10-CM | POA: Diagnosis not present

## 2023-11-29 DIAGNOSIS — J45909 Unspecified asthma, uncomplicated: Secondary | ICD-10-CM | POA: Insufficient documentation

## 2023-11-29 NOTE — Assessment & Plan Note (Signed)
 A bit worse since having covid (did increase freq of symbicort )  Reassuring exam  Will return to regular activity as tolerated If worse consider cxr

## 2023-11-29 NOTE — Assessment & Plan Note (Signed)
 Once respiratory symptoms are better, interested in weaning ppi  May have to come off of glp-1 (not sure) Watching triggers and losing weight   Protonix  40 mg daily currently with famotidine at night  If she decides to wean would go to every other day 1 mo then every 3rd 1 mo  Would increase famotidine to 20-40  mg bid  She will let us  know if/when she is ready  Aware to hold off on nsaid also

## 2023-11-29 NOTE — Patient Instructions (Addendum)
 Don't start weaning ppi until you are fully over covid and reactive airways are better   In future if you are going to try and wean Take every other day -1 month Every third day- 1 month  Then stop  Expect breakthrough symptoms   Continue watching diet    We would put you on famotidine 20 mg twice daily   Find out if your insurance would cover a dexa (bone density test) before age 64   Blood pressure is in good control

## 2023-11-29 NOTE — Assessment & Plan Note (Signed)
 Still bothersome  Seeing osteopath for manipulation  Has had 2 shots  Doing acupuncture

## 2023-11-29 NOTE — Assessment & Plan Note (Signed)
 bp in fair control at this time  No symptoms BP Readings from Last 1 Encounters:  11/29/23 124/82   No changes needed Doing well with olmesartan  20 mg daily (dropped from 40)  Tolerates this    Most recent labs reviewed  Disc lifstyle change with low sodium diet and exercise  Plans to keep working on wt loss and fitness  Echo noted some DD and referred to cardiology to discuss

## 2023-11-29 NOTE — Progress Notes (Signed)
 Subjective:    Patient ID: Patty Williams, female    DOB: 20-Aug-1960, 63 y.o.   MRN: [id number]  HPI  Wt Readings from Last 3 Encounters:  11/29/23 190 lb 4 oz (86.3 kg)  11/18/23 185 lb (83.9 kg)  11/02/23 185 lb (83.9 kg)   31.66 kg/m  Vitals:   11/29/23 1029  BP: 124/82  Pulse: 62  Temp: 98.4 F (36.9 C)  SpO2: 97%     Pt presents to discuss GERD / desire to get off ppi medication  HTN  Form for CACI (hypertenstion work sheet)  Back pain  Recent case of covid    Had covid  Exposed July 3  Symptoms July 5th  Saw Dr Jimmy July 10  Still some reactive airway symptoms -did increase her symbicort  to multiple times daily  Some albuterol      GERD Takes protonix  daily mid day to evening  At bedtime takes famotidine  Dr Avram is her GI   Alginate float-per nutritionist -helps also  Last dexa normal 2017    Takes semaglutide  2 mg weekly  On it from her weight loss doctor  No refined sugars at all (she feels addicted to refined sugar)- this is day 63  Has a goal to get off GLP-1 med    Nsaid use -naproxen  is on med list but takes infrequently     Back pain  Living with her low back pain a bit better now  Is not gone Injections helped first time but not 2nd  Seeing osteopathic dr for acupuncture and also adjustments  Does some strength training at home/ would love to have a trainer     Stress Next spring will retire fully   Takes vit D with mag   Lab Results  Component Value Date   VITAMINB12 878 07/08/2023     EGD 01/2009 Noted abn mucosa in 2nd portion of duodenum and bx sent (these were b9 and no signs of celiac dz)  HTN bp is stable today  No cp or palpitations or headaches or edema  No side effects to medicines  BP Readings from Last 3 Encounters:  11/29/23 124/82  11/02/23 115/70  10/19/23 102/80    Olmesartan  20 mg daily   Lab Results  Component Value Date   NA 142 08/12/2023   K 4.2 08/12/2023   CO2 23 08/12/2023    GLUCOSE 92 08/12/2023   BUN 21 08/12/2023   CREATININE 0.76 08/12/2023   CALCIUM  9.2 08/12/2023   GFR 94.36 03/05/2023   EGFR 88 08/12/2023   GFRNONAA 98 06/27/2020   Stable since last visit and before  No symptoms     Patient Active Problem List   Diagnosis Date Noted   Reactive airway disease 11/29/2023   COVID-19 virus infection 11/18/2023   Cervical disc disease 08/22/2023   Trochanteric bursitis 08/22/2023   Gluteal tendonitis 08/22/2023   Diastolic dysfunction 08/16/2023   IPF (idiopathic pulmonary fibrosis) (HCC) 07/23/2023   Cardiomegaly 07/23/2023   Right calf pain 03/25/2023   Chronic back pain 03/16/2023   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 02/25/2023   Muscle cramps 02/11/2023   Notalgia 08/11/2022   Hyperlipidemia associated with type 2 diabetes mellitus (HCC) 07/02/2022   Elevated ALT measurement 07/02/2022   Stress 06/16/2022   BMI 31.0-31.9,adult 06/16/2022   Obesity, Beginning BMI 38.11 06/16/2022   Type 2 diabetes mellitus with obesity (HCC) 05/25/2022   Other hyperlipidemia 01/05/2022   Heat intolerance 12/15/2021   Type  2 diabetes mellitus with other specified complication (HCC) 09/30/2021   Lumbar disc disease 02/11/2021   Other fatigue 06/27/2020   Vitamin D  deficiency 06/27/2020   At risk for heart disease 06/27/2020   Intermittent diarrhea 03/15/2020   Hx of adenomatous polyp of colon 03/24/2019   Family history of pulmonary fibrosis 12/23/2018   Family history of kidney disease 12/23/2018   Colon cancer screening 12/23/2018   Current use of proton pump inhibitor 10/18/2015   History of fracture 10/18/2015   Left knee pain 04/19/2015   Frequent UTI 10/06/2013   Seasonal and perennial allergic rhinitis 01/01/2013   Routine general medical examination at a health care facility 04/30/2011   Diabetes mellitus treated with injections of non-insulin  medication (HCC)    Plantar fasciitis    METATARSALGIA 04/11/2010    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   Obstructive sleep apnea 06/10/2009   GERD 11/09/2008   Essential hypertension 03/02/2008   Class 1 obesity due to excess calories with serious comorbidity and body mass index (BMI) of 33.0 to 33.9 in adult 11/18/2007   Allergic asthma, mild intermittent, uncomplicated 03/29/2007   Endometriosis 03/29/2007   Past Medical History:  Diagnosis Date   Anxiety    Asthma    Back pain    Bilateral ovarian cysts    Bruxism    Constipation    Diabetes mellitus type II    mild   Diverticulosis 2014   Mild   Dry mouth    Elevated blood pressure reading without diagnosis of hypertension    Endometriosis    Severe   Fibroids    GERD (gastroesophageal reflux disease) 0   Not sure when   History of ovarian cyst    HLD (hyperlipidemia)    Hx of adenomatous polyp of colon 03/24/2019   Hyperglycemia    Hyperlipidemia    Hypertension    Iron deficiency anemia    Joint pain    Menorrhagia    Obesity    Obesity    OSA on CPAP    Plantar fasciitis    with orthotics-much difficulty adjusting these   PMB (postmenopausal bleeding)    Pneumonia    history of   PONV (postoperative nausea and vomiting)    Pre-diabetes    Rectovaginal fistula    Sleep apnea    Uterine adhesion    Uterine fibroid    Wears glasses    Past Surgical History:  Procedure Laterality Date   COLONOSCOPY  01/2009   DILATATION & CURETTAGE/HYSTEROSCOPY WITH MYOSURE N/A 09/22/2018   Procedure: DILATATION & CURETTAGE/HYSTEROSCOPY;  Surgeon: Kandyce Sor, MD;  Location: [facility] [place];  Service: Gynecology;  Laterality: N/A;   HEMORRHOID BANDING     HEMORRHOID SURGERY     LAPAROSCOPY     endometriosis   NASAL SEPTOPLASTY W/ TURBINOPLASTY Bilateral 09/02/2021   Procedure: NASAL SEPTOPLASTY WITH SUBMUCOSAL RESECTION OF TURBINATES;  Surgeon: Herminio Miu, MD;  Location: [facility];  Service: ENT;  Laterality: Bilateral;   SEPTOPLASTY     april 2023    TONSILLECTOMY AND ADENOIDECTOMY     TRIGGER FINGER RELEASE     UPPER GI ENDOSCOPY  01/2009   uterine biopsy     uterine cyst exploration     UTERINE SUSPENSION     WISDOM TOOTH EXTRACTION     Social History   Tobacco Use   Smoking status: Never   Smokeless tobacco: Never  Vaping Use   Vaping status: Never Used  Substance Use Topics  Alcohol use: Not Currently    Comment: occasionally   Drug use: No   Family History  Problem Relation Age of Onset   Thyroid  disease Mother        ?   Diabetes Mother    Hypertension Mother    Hyperlipidemia Mother    Anxiety disorder Mother    Sleep apnea Mother    Obesity Mother    Heart disease Mother    Pulmonary fibrosis Mother 45   Pulmonary fibrosis Father 13   Hypertension Father    Obesity Father    Prostate cancer Father 43 - 17   Cancer Father    Heart attack Maternal Grandfather 1 - 20   Parkinson's disease Paternal Grandfather    Emphysema Paternal Grandfather    Stomach cancer Neg Hx    Colon cancer Neg Hx    Esophageal cancer Neg Hx    No Known Allergies Current Outpatient Medications on File Prior to Visit  Medication Sig Dispense Refill   albuterol  (VENTOLIN  HFA) 108 (90 Base) MCG/ACT inhaler Inhale 2 puffs into the lungs every 4 (four) hours as needed. 8.5 g 5   AMBULATORY NON FORMULARY MEDICATION Single glucometer with lancets, test strips-Check FBS and 2 hour PP(BID) 1 each 0   budesonide -formoterol  (SYMBICORT ) 80-4.5 MCG/ACT inhaler USE 2 INHALATIONS TWICE A DAY 10.2 g 5   Cholecalciferol (VITAMIN D3) 50 MCG (2000 UT) capsule 2 po every day ( 4 - 5000 IU every day)     Coenzyme Q10 (CO Q 10 PO) Take 300 mg by mouth daily.     docusate sodium (COLACE) 100 MG capsule Take 100 mg by mouth daily as needed for mild constipation.     famotidine (PEPCID) 40 MG tablet Take 40 mg by mouth daily.     MAGNESIUM GLYCINATE PO Take 240 mg by mouth in the morning and at bedtime.     Multiple Vitamin (MULTIVITAMIN) tablet Take  1 tablet by mouth daily.     naproxen  (NAPROSYN ) 500 MG tablet Take 1 tablet (500 mg total) by mouth 2 (two) times daily as needed for moderate pain. With a meal 60 tablet 3   olmesartan  (BENICAR ) 40 MG tablet Take 1 tablet (40 mg total) by mouth daily. 90 tablet 3   pantoprazole  (PROTONIX ) 40 MG tablet Take 1 tablet (40 mg total) by mouth 2 (two) times daily before a meal. Breakfast and supper 60 tablet 11   rosuvastatin  (CRESTOR ) 10 MG tablet Take 1 tablet (10 mg total) by mouth daily. 90 tablet 3   Semaglutide , 2 MG/DOSE, (OZEMPIC , 2 MG/DOSE,) 8 MG/3ML SOPN Inject 2 mg into the skin once a week. 3 mL 0   No current facility-administered medications on file prior to visit.    Review of Systems  Constitutional:  Negative for activity change, appetite change, fatigue, fever and unexpected weight change.  HENT:  Negative for congestion, ear pain, rhinorrhea, sinus pressure and sore throat.   Eyes:  Negative for pain, redness and visual disturbance.  Respiratory:  Negative for cough, shortness of breath and wheezing.   Cardiovascular:  Negative for chest pain and palpitations.  Gastrointestinal:  Negative for abdominal pain, blood in stool, constipation and diarrhea.  Endocrine: Negative for polydipsia and polyuria.  Genitourinary:  Negative for dysuria, frequency and urgency.  Musculoskeletal:  Negative for arthralgias, back pain and myalgias.  Skin:  Negative for pallor and rash.  Allergic/Immunologic: Negative for environmental allergies.  Neurological:  Negative for dizziness, syncope and headaches.  Hematological:  Negative for adenopathy. Does not bruise/bleed easily.  Psychiatric/Behavioral:  Negative for decreased concentration and dysphoric mood. The patient is not nervous/anxious.        Objective:   Physical Exam Constitutional:      General: She is not in acute distress.    Appearance: Normal appearance. She is well-developed. She is obese.  HENT:     Head: Normocephalic  and atraumatic.     Mouth/Throat:     Mouth: Mucous membranes are moist.     Pharynx: Oropharynx is clear.  Eyes:     General:        Right eye: No discharge.        Left eye: No discharge.     Conjunctiva/sclera: Conjunctivae normal.     Pupils: Pupils are equal, round, and reactive to light.  Neck:     Thyroid : No thyromegaly.     Vascular: No carotid bruit or JVD.  Cardiovascular:     Rate and Rhythm: Normal rate and regular rhythm.     Heart sounds: Normal heart sounds.     No gallop.  Pulmonary:     Effort: Pulmonary effort is normal. No respiratory distress.     Breath sounds: Normal breath sounds. No stridor. No wheezing, rhonchi or rales.     Comments: Good air exch Abdominal:     General: There is no distension or abdominal bruit.     Palpations: Abdomen is soft.  Musculoskeletal:     Cervical back: Normal range of motion and neck supple.     Right lower leg: No edema.     Left lower leg: No edema.     Comments: Limited rom of LS  Lymphadenopathy:     Cervical: No cervical adenopathy.  Skin:    General: Skin is warm and dry.     Coloration: Skin is not jaundiced or pale.     Findings: No bruising or rash.  Neurological:     Mental Status: She is alert.     Coordination: Coordination normal.     Deep Tendon Reflexes: Reflexes are normal and symmetric. Reflexes normal.  Psychiatric:        Mood and Affect: Mood normal.           Assessment & Plan:   Problem List Items Addressed This Visit       Cardiovascular and Mediastinum   Essential hypertension   bp in fair control at this time  No symptoms BP Readings from Last 1 Encounters:  11/29/23 124/82   No changes needed Doing well with olmesartan  20 mg daily (dropped from 40)  Tolerates this    Most recent labs reviewed  Disc lifstyle change with low sodium diet and exercise  Plans to keep working on wt loss and fitness  Echo noted some DD and referred to cardiology to discuss         Respiratory   Reactive airway disease   A bit worse since having covid (did increase freq of symbicort )  Reassuring exam  Will return to regular activity as tolerated If worse consider cxr          Digestive   GERD - Primary   Once respiratory symptoms are better, interested in weaning ppi  May have to come off of glp-1 (not sure) Watching triggers and losing weight   Protonix  40 mg daily currently with famotidine at night  If she decides to wean would go to every other day 1 mo then every  3rd 1 mo  Would increase famotidine to 20-40  mg bid  She will let us  know if/when she is ready  Aware to hold off on nsaid also          Musculoskeletal and Integument   Lumbar disc disease   Still bothersome  Seeing osteopath for manipulation  Has had 2 shots  Doing acupuncture

## 2023-11-30 ENCOUNTER — Telehealth (INDEPENDENT_AMBULATORY_CARE_PROVIDER_SITE_OTHER): Payer: Self-pay

## 2023-11-30 DIAGNOSIS — H40033 Anatomical narrow angle, bilateral: Secondary | ICD-10-CM | POA: Diagnosis not present

## 2023-11-30 LAB — OPHTHALMOLOGY REPORT-SCANNED

## 2023-11-30 NOTE — Telephone Encounter (Signed)
 Request Reference Number: [id number]. MOUNJARO  INJ 2.5/0.5 is approved through 11/29/2024. Your patient may now fill this prescription and it will be covered.   Authorization Expiration Date: November 29, 2024.

## 2023-12-02 ENCOUNTER — Encounter (INDEPENDENT_AMBULATORY_CARE_PROVIDER_SITE_OTHER): Payer: Self-pay | Admitting: Family Medicine

## 2023-12-02 ENCOUNTER — Ambulatory Visit (INDEPENDENT_AMBULATORY_CARE_PROVIDER_SITE_OTHER): Admitting: Family Medicine

## 2023-12-02 VITALS — BP 122/78 | HR 57 | Temp 97.8°F | Ht 65.0 in | Wt 186.0 lb

## 2023-12-02 DIAGNOSIS — E119 Type 2 diabetes mellitus without complications: Secondary | ICD-10-CM

## 2023-12-02 DIAGNOSIS — E1169 Type 2 diabetes mellitus with other specified complication: Secondary | ICD-10-CM

## 2023-12-02 DIAGNOSIS — E669 Obesity, unspecified: Secondary | ICD-10-CM | POA: Diagnosis not present

## 2023-12-02 DIAGNOSIS — Z6830 Body mass index (BMI) 30.0-30.9, adult: Secondary | ICD-10-CM

## 2023-12-02 DIAGNOSIS — E559 Vitamin D deficiency, unspecified: Secondary | ICD-10-CM

## 2023-12-02 DIAGNOSIS — Z7985 Long-term (current) use of injectable non-insulin antidiabetic drugs: Secondary | ICD-10-CM

## 2023-12-02 DIAGNOSIS — K760 Fatty (change of) liver, not elsewhere classified: Secondary | ICD-10-CM | POA: Diagnosis not present

## 2023-12-02 MED ORDER — OZEMPIC (2 MG/DOSE) 8 MG/3ML ~~LOC~~ SOPN: 2.0000 mg | PEN_INJECTOR | SUBCUTANEOUS | 0 refills | Status: DC

## 2023-12-02 NOTE — Progress Notes (Signed)
 Office: [phone]  /  Fax: [phone]  WEIGHT SUMMARY AND BIOMETRICS  Anthropometric Measurements Height: 5' 5 (1.651 m) Weight: 186 lb (84.4 kg) BMI (Calculated): 30.95 Weight at Last Visit: 185 lb Weight Lost Since Last Visit: 0 Weight Gained Since Last Visit: 1 lb Starting Weight: 229 lb Total Weight Loss (lbs): 43 lb (19.5 kg) Peak Weight: 232 lb   Body Composition  Body Fat %: 41.4 % Fat Mass (lbs): 77 lbs Muscle Mass (lbs): 103.4 lbs Total Body Water (lbs): 75 lbs Visceral Fat Rating : 11   Other Clinical Data Fasting: yes Labs: no Today's Visit #: 44 Starting Date: 06/27/20    Chief Complaint: OBESITY    History of Present Illness Patty Williams is a 63 year old female who presents for a follow-up on her obesity treatment and progress.  She has been adhering to the category two eating plan 80% of the time but has not been exercising recently due to illness. She has gained one pound in the last month. She is concerned about potential muscle loss, having previously lost 2.5 pounds of muscle. Currently, she is doing beginner's Pilates once a week and walking, but has stopped going to the gym since being sick.  She recently had COVID-19, contracted from a friend, with symptoms including rhinorrhea, nasal congestion, and headache, but no pharyngitis. She used Symbicort  six times a day to manage her symptoms and experienced chest tightness initially but did not have a productive cough. She has a history of reactive airway, which causes her to cough when exposed to cold air or after talking or laughing too much. She is feeling better now but is dealing with a lingering cough.  She is actively monitoring her protein intake to prevent muscle loss, especially since she was not exercising while sick. She has been on a no-glucose diet for 66 days and is managing her blood sugars well. She is taking Ozempic  and has discussed the possibility of stopping PPIs, but is  worried about her bone health due to long-term PPI use. She consumes a lot of dairy to meet her calcium  needs and is taking vitamin D .  She is interested in tracking her fatty liver improvement and is involved in ballooning activities, which require physical exertion and are part of her active lifestyle. She is planning to get her labs done at [facility] in [place] and is preparing for an upcoming appointment with her AME in August.      PHYSICAL EXAM:  Blood pressure 122/78, pulse (!) 57, temperature 97.8 F (36.6 C), height 5' 5 (1.651 m), weight 186 lb (84.4 kg), last menstrual period 10/08/2018, SpO2 99%. Body mass index is 30.95 kg/m.  DIAGNOSTIC DATA REVIEWED:  BMET    Component Value Date/Time   NA 142 08/12/2023 1607   K 4.2 08/12/2023 1607   CL 101 08/12/2023 1607   CO2 23 08/12/2023 1607   GLUCOSE 92 08/12/2023 1607   GLUCOSE 98 03/05/2023 0824   BUN 21 08/12/2023 1607   CREATININE 0.76 08/12/2023 1607   CALCIUM  9.2 08/12/2023 1607   GFRNONAA 98 06/27/2020 0840   GFRAA 113 06/27/2020 0840   Lab Results  Component Value Date   HGBA1C 5.9 (H) 07/08/2023   HGBA1C 6.5 (H) 03/02/2008   Lab Results  Component Value Date   INSULIN  27.3 (H) 07/08/2023   INSULIN  19.9 06/27/2020   Lab Results  Component Value Date   TSH 2.44 03/05/2023   CBC    Component Value Date/Time  WBC 7.2 07/08/2023 1006   WBC 6.3 03/05/2023 0824   RBC 4.30 07/08/2023 1006   RBC 4.33 03/05/2023 0824   HGB 13.4 07/08/2023 1006   HCT 40.1 07/08/2023 1006   PLT 233 07/08/2023 1006   MCV 93 07/08/2023 1006   MCH 31.2 07/08/2023 1006   MCH 28.9 09/23/2018 0404   MCHC 33.4 07/08/2023 1006   MCHC 32.1 03/05/2023 0824   RDW 13.9 07/08/2023 1006   Iron Studies    Component Value Date/Time   IRON 92 07/08/2023 1006   TIBC 382 07/08/2023 1006   FERRITIN 40 07/08/2023 1006   IRONPCTSAT 24 07/08/2023 1006   Lipid Panel     Component Value Date/Time   CHOL 166 07/08/2023 1006   TRIG  50 07/08/2023 1006   HDL 64 07/08/2023 1006   CHOLHDL 4 03/05/2023 0824   VLDL 18.4 03/05/2023 0824   LDLCALC 92 07/08/2023 1006   LDLDIRECT 144.2 09/21/2008 1008   Hepatic Function Panel     Component Value Date/Time   PROT 6.6 08/12/2023 1607   ALBUMIN 4.2 08/12/2023 1607   AST 27 08/12/2023 1607   ALT 35 (H) 08/12/2023 1607   ALKPHOS 73 08/12/2023 1607   BILITOT 0.4 08/12/2023 1607   BILIDIR 0.1 03/02/2008 1018      Component Value Date/Time   TSH 2.44 03/05/2023 0824   Nutritional Lab Results  Component Value Date   VD25OH 67.2 07/08/2023   VD25OH 34.66 03/05/2023   VD25OH 63.3 11/02/2022     Assessment and Plan Assessment & Plan  Type 2 Diabetes On day 66 of no glucose intake, reporting stable blood glucose levels. Managing with dietary changes and Ozempic . Discussed potential need to stop GLP-1 if discontinuing PPIs, but current calcium  and vitamin D  intake mitigates osteoporosis risk from chronic PPI use. Advised to track calcium  intake to ensure 1500 mg/day. - Continue Ozempic . - Monitor calcium  and vitamin D  intake. - Consider stopping GLP-1 if discontinuing PPIs. - Track calcium  intake to meet 1500 mg/day.  Obesity Following category two eating plan 80% of the time, with recent exercise lapse due to COVID-19 infection. Gained one pound last month. Emphasized protein intake and strengthening exercises to prevent muscle mass loss. Lost 43 pounds overall. Suggested adding ankle weights to walking routine and discussed personal trainer for accountability. - Continue category two eating plan. - Add two-pound ankle weights to walking routine. - Encourage protein intake and strengthening exercises. - Consider personal trainer for accountability. - Follow up in one month.  Fatty Liver Disease Inquired about non-scan methods to assess improvement. Explained scans are definitive for assessing improvement, though liver function tests are monitored. Advised to continue  current management plan and consider scans if necessary. - Continue current management plan with weight loss efforts - Consider abdominal ultrasound or fibroscan if needed.  General Health Maintenance Actively managing health through diet, exercise, and regular check-ups. Engages in activities like ballooning and Pilates. Discussed benefits of spreading calcium  intake throughout the day compared to supplements. - Encourage continued engagement in physical activities like ballooning and Pilates. - Monitor calcium  intake to meet 1500 mg/day. - Spread calcium  intake throughout the day. - Will check labs and fill out FAA form when results return      She was informed of the importance of frequent follow up visits to maximize her success with intensive lifestyle modifications for her multiple health conditions.    Louann Penton, MD

## 2023-12-03 LAB — CMP14+EGFR
ALT: 28 IU/L (ref 0–32)
AST: 27 IU/L (ref 0–40)
Albumin: 4.2 g/dL (ref 3.9–4.9)
Alkaline Phosphatase: 78 IU/L (ref 44–121)
BUN/Creatinine Ratio: 25 (ref 12–28)
BUN: 14 mg/dL (ref 8–27)
Bilirubin Total: 0.4 mg/dL (ref 0.0–1.2)
CO2: 23 mmol/L (ref 20–29)
Calcium: 9.3 mg/dL (ref 8.7–10.3)
Chloride: 104 mmol/L (ref 96–106)
Creatinine, Ser: 0.55 mg/dL — ABNORMAL LOW (ref 0.57–1.00)
Globulin, Total: 2.4 g/dL (ref 1.5–4.5)
Glucose: 88 mg/dL (ref 70–99)
Potassium: 4.2 mmol/L (ref 3.5–5.2)
Sodium: 142 mmol/L (ref 134–144)
Total Protein: 6.6 g/dL (ref 6.0–8.5)
eGFR: 103 mL/min/1.73 (ref >59)

## 2023-12-03 LAB — MICROALBUMIN / CREATININE URINE RATIO
Creatinine, Urine: 14.4 mg/dL
Microalb/Creat Ratio: <21 mg/g{creat} (ref 0–29)
Microalbumin, Urine: <3 ug/mL

## 2023-12-03 LAB — CBC WITH DIFFERENTIAL/PLATELET
Basophils Absolute: 0 x10E3/uL (ref 0.0–0.2)
Basos: 1 %
EOS (ABSOLUTE): 0 x10E3/uL (ref 0.0–0.4)
Eos: 1 %
Hematocrit: 38.1 % (ref 34.0–46.6)
Hemoglobin: 12.5 g/dL (ref 11.1–15.9)
Immature Grans (Abs): 0 x10E3/uL (ref 0.0–0.1)
Immature Granulocytes: 0 %
Lymphocytes Absolute: 2.2 x10E3/uL (ref 0.7–3.1)
Lymphs: 39 %
MCH: 30.2 pg (ref 26.6–33.0)
MCHC: 32.8 g/dL (ref 31.5–35.7)
MCV: 92 fL (ref 79–97)
Monocytes Absolute: 0.5 x10E3/uL (ref 0.1–0.9)
Monocytes: 10 %
Neutrophils Absolute: 2.8 x10E3/uL (ref 1.4–7.0)
Neutrophils: 49 %
Platelets: 274 x10E3/uL (ref 150–450)
RBC: 4.14 x10E6/uL (ref 3.77–5.28)
RDW: 13.3 % (ref 11.7–15.4)
WBC: 5.5 x10E3/uL (ref 3.4–10.8)

## 2023-12-03 LAB — VITAMIN B12: Vitamin B-12: 1449 pg/mL — ABNORMAL HIGH (ref 232–1245)

## 2023-12-03 LAB — INSULIN, RANDOM: INSULIN: 13.8 u[IU]/mL (ref 2.6–24.9)

## 2023-12-03 LAB — LIPID PANEL WITH LDL/HDL RATIO
Cholesterol, Total: 138 mg/dL (ref 100–199)
HDL: 45 mg/dL (ref >39)
LDL Chol Calc (NIH): 82 mg/dL (ref 0–99)
LDL/HDL Ratio: 1.8 ratio (ref 0.0–3.2)
Triglycerides: 52 mg/dL (ref 0–149)
VLDL Cholesterol Cal: 11 mg/dL (ref 5–40)

## 2023-12-03 LAB — TSH: TSH: 1.1 u[IU]/mL (ref 0.450–4.500)

## 2023-12-03 LAB — HEMOGLOBIN A1C
Est. average glucose Bld gHb Est-mCnc: 123 mg/dL
Hgb A1c MFr Bld: 5.9 % — ABNORMAL HIGH (ref 4.8–5.6)

## 2023-12-03 LAB — VITAMIN D 25 HYDROXY (VIT D DEFICIENCY, FRACTURES): Vit D, 25-Hydroxy: 108 ng/mL — ABNORMAL HIGH (ref 30.0–100.0)

## 2023-12-06 ENCOUNTER — Ambulatory Visit: Admitting: Internal Medicine

## 2023-12-06 DIAGNOSIS — Z0289 Encounter for other administrative examinations: Secondary | ICD-10-CM | POA: Diagnosis not present

## 2023-12-06 DIAGNOSIS — R059 Cough, unspecified: Secondary | ICD-10-CM

## 2023-12-06 LAB — PULMONARY FUNCTION TEST
DL/VA % pred: 101 %
DL/VA: 4.24 ml/min/mmHg/L
DLCO cor % pred: 100 %
DLCO cor: 20.5 ml/min/mmHg
DLCO unc % pred: 97 %
DLCO unc: 19.91 ml/min/mmHg
FEF 25-75 Post: 3.97 L/s
FEF 25-75 Pre: 3.46 L/s
FEF2575-%Change-Post: 14 %
FEF2575-%Pred-Post: 174 %
FEF2575-%Pred-Pre: 151 %
FEV1-%Change-Post: 3 %
FEV1-%Pred-Post: 107 %
FEV1-%Pred-Pre: 103 %
FEV1-Post: 2.77 L
FEV1-Pre: 2.67 L
FEV1FVC-%Change-Post: 1 %
FEV1FVC-%Pred-Pre: 109 %
FEV6-%Change-Post: 1 %
FEV6-%Pred-Post: 99 %
FEV6-%Pred-Pre: 97 %
FEV6-Post: 3.2 L
FEV6-Pre: 3.14 L
FEV6FVC-%Pred-Post: 103 %
FEV6FVC-%Pred-Pre: 103 %
FVC-%Change-Post: 1 %
FVC-%Pred-Post: 95 %
FVC-%Pred-Pre: 93 %
FVC-Post: 3.2 L
FVC-Pre: 3.14 L
Post FEV1/FVC ratio: 86 %
Post FEV6/FVC ratio: 100 %
Pre FEV1/FVC ratio: 85 %
Pre FEV6/FVC Ratio: 100 %
RV % pred: 93 %
RV: 1.95 L
TLC % pred: 99 %
TLC: 5.15 L

## 2023-12-06 NOTE — Progress Notes (Signed)
 Full PFT completed today ? ?

## 2023-12-06 NOTE — Patient Instructions (Signed)
 Full PFT completed today ? ?

## 2023-12-07 ENCOUNTER — Other Ambulatory Visit (INDEPENDENT_AMBULATORY_CARE_PROVIDER_SITE_OTHER): Payer: Self-pay | Admitting: Family Medicine

## 2023-12-07 ENCOUNTER — Encounter (INDEPENDENT_AMBULATORY_CARE_PROVIDER_SITE_OTHER): Payer: Self-pay | Admitting: Family Medicine

## 2023-12-07 DIAGNOSIS — Z01411 Encounter for gynecological examination (general) (routine) with abnormal findings: Secondary | ICD-10-CM | POA: Diagnosis not present

## 2023-12-07 DIAGNOSIS — N952 Postmenopausal atrophic vaginitis: Secondary | ICD-10-CM | POA: Diagnosis not present

## 2023-12-07 DIAGNOSIS — Z124 Encounter for screening for malignant neoplasm of cervix: Secondary | ICD-10-CM | POA: Diagnosis not present

## 2023-12-08 ENCOUNTER — Encounter: Payer: Self-pay | Admitting: Internal Medicine

## 2023-12-08 ENCOUNTER — Telehealth: Payer: Self-pay

## 2023-12-08 ENCOUNTER — Encounter: Payer: Self-pay | Admitting: Genetic Counselor

## 2023-12-08 NOTE — Telephone Encounter (Signed)
 Left message on patients VM.  Paperwork has been completed for [employer] Insurance account manager) by Dr. Neysa.  Please ask patient if she wants to pick up paperwork or have this mailed to her.  Patient has an MyChart video visit with Dr. Geronimo on 12/09/2023. Paperwork is in envelope on Dr. Sheryll desk in [facility] (in wire basket on side).

## 2023-12-08 NOTE — Telephone Encounter (Signed)
 Called patient.  Patient will come by office on Friday, 12/10/2023 to pick up forms.  Forms ready and placed at front desk.

## 2023-12-09 ENCOUNTER — Ambulatory Visit: Admitting: Internal Medicine

## 2023-12-09 DIAGNOSIS — J84112 Idiopathic pulmonary fibrosis: Secondary | ICD-10-CM

## 2023-12-09 DIAGNOSIS — R911 Solitary pulmonary nodule: Secondary | ICD-10-CM

## 2023-12-09 NOTE — Patient Instructions (Addendum)
 Family history of pulmonary fibrosis - ILD (interstitial lung disease) (HCC) -  Positive ANA 1: 80 in 2024 and 1:160 in May 2025  - I think of interstitial lung abnormality and heart disease.   - Pulmonary function test is normal  - And you are asymptomatic - Seen by [facility] - you are deciding whether to go ahead with $2000 test  Plan - - Do high-resolution CT chest March 2026 - Bring ILD questionnaire bring it back  - hold off genetic test due to cost   Nodule of lower lobe of left lung 4mm in fEb 2025 - c/w Benign  Plan  - capture information March 2026 HRCT for ILA   Coronary artery calcification seen on CAT scan   Plan  - keep appt with Dr Anner in cardiology   Follow-up -  March 2026 after HRCT   - 15 min visit

## 2023-12-09 NOTE — Progress Notes (Signed)
 OV 09/20/2023  Subjective:  Patient ID: Patty Williams, female , DOB: 02-23-1961 , age 63 y.o. , MRN: [id number] , ADDRESS: [address] [place] [address] PCP Tower, Laine LABOR, MD Patient Care Team: Randeen Laine LABOR, MD as PCP - General  This Provider for this visit: Treatment Team:  Attending Provider: Geronimo Amel, MD    09/20/2023 -   Chief Complaint  Patient presents with   Follow-up    Referred by Dr Reggy Salt for eval of ILD.  Breathing is currently stable.      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  She saw the practice and now is doing education.  She is originally from [place].  She is concerned that she has ILD.  She says the general American dental Association published study showing that the started high risk for getting ILD.  In addition she quoted Dr. 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  I am aware of the second study.  She also stated that her second risk factor is that her parents suffer/suffered from pulmonary fibrosis.  Her mother died in January 07, 2017 from pulmonary fibrosis at age 26.  Her father age 29 is still alive and lives in the [place] of [place] and suffers from pulmonary fibrosis details are not much known.  She is essentially asymptomatic although she states that she always feels like she is never gets a good deep breath.  When she had pulmonary function testing the albuterol  inhaler has helped.  She has seen Dr. Salt and she is on albuterol  and Symbicort  for this.  She is also trying to lose weight with Ozempic .  Because of the concern of ILD she did have a high-resolution CT chest in the February 2025.  I personally visualized it and shows interstitial lung abnormalities in my opinion.  She has never had COVID.   There is a 4 mm nodule that I believe is a lymph node  She also has coronary artery calcification.  She had a recent echo where her ejection fraction is low normal.  In the past and slight trace +1: 80.  She is open to get seeing the genetics counselor.      CT Chest data from date: FEB 2025  - personally visualized and independently interpreted : YES - my findings are: ILD - not ILD arrative & Impression  CLINICAL DATA:  Evaluate for interstitial lung disease, family history, confusional exposure.   EXAM: CT CHEST WITHOUT CONTRAST   TECHNIQUE: Multidetector CT imaging of the chest was performed following the standard protocol without intravenous contrast. High resolution imaging of the lungs, as well as inspiratory and expiratory imaging, was performed.   RADIATION DOSE REDUCTION: This exam was performed according to the departmental dose-optimization program which includes automated exposure control, adjustment of the mA and/or kV according to patient size and/or use of iterative reconstruction technique.   COMPARISON:  Cardiac CT 03/18/2023 and CT abdomen 10/21/2012.   FINDINGS: Cardiovascular: Minimal atherosclerotic calcification of the aorta with small amount of ulcerative plaque in the aortic arch. Left anterior descending coronary artery calcification. Heart is at the upper limits of normal in size to mildly enlarged. No pericardial effusion.   Mediastinum/Nodes: No pathologically enlarged mediastinal or axillary  lymph nodes. Hilar regions are difficult to definitively evaluate without IV contrast. Esophagus is grossly unremarkable.   Lungs/Pleura: Very mild peripheral and basilar intralobular and interlobular lines with ground-glass. No bronchiectasis/bronchiolectasis, architectural distortion or honeycombing. 4 mm juxtapleural nodule in the lateral left lower lobe, considered benign. No suspicious pulmonary nodules. No pleural fluid. Airway is  unremarkable. There is air trapping.   Upper Abdomen: Tiny left hepatic lobe cyst. No specific follow-up necessary. Partially imaged 1.6 cm low-attenuation lesion in the head of the pancreas (2/168), present dating back to 10/21/2012 and considered benign. Visualized portions of the liver, gallbladder, adrenal glands, kidneys, spleen, pancreas, stomach and bowel are otherwise grossly unremarkable. No upper abdominal adenopathy.   Musculoskeletal: Degenerative changes in the spine.   IMPRESSION: 1. Very mild peripheral and basilar intralobular and interlobular lines with ground-glass, concerning for early/mild interstitial lung disease. Findings are indeterminate for UIP per consensus guidelines: Diagnosis of Idiopathic Pulmonary Fibrosis: An Official ATS/ERS/JRS/ALAT Clinical Practice Guideline. Am JINNY Honey Crit Care Med Vol 198, Iss 5, [phone], Jan 09 2017. 2. Air trapping is indicative of small airways disease. 3. Left anterior descending coronary artery calcification.     Electronically Signed   By: Newell Eke M.D.   On: 07/07/2023 11:24    OV 12/09/2023  Subjective:  Patient ID: Patty Williams, female , DOB: [birth year] , age 25 y.o. , MRN: [id number] , ADDRESS: [address] [place] [address] PCP Tower, Laine LABOR, MD Patient Care Team: Randeen Laine LABOR, MD as PCP - General  This Provider for this visit: Treatment Team:  Attending Provider: Geronimo Amel, MD    12/09/2023 -     This encounter for Patty Williams 11-15-1960 date of birth and MRN [id number] on this 12/09/2023 is taking place via Telephone. Patient is at her office. I the provider, Ameliana Brashear is at ,y office on [address].  The following NONE was also on this call and relationship to patient was none. Patient was identified with 2PHI. Risks benefits, and limitations of telephone visit explained and participant agreed to proceed.       HPI Patty Williams 63 y.o. -patient tried to log  onto the video but she had her clinic and she was not ready but later when she was ready I had to see another patient.  Then when I called it would not connect so we had to resort to telephone conversation.  #Interstitial lung abnormalities: She did repeat serology ANA slightly more 1: 160 but cytoplasmic's.  She says she is [employer] dated more autoimmune panel and they have reassured her.  She did see [facility], broadcasting/film/video.  Genetic testing is $2000 so she is going to defer and I supported her in this.  She did pulmonary function test that is normal.  We agreed to do a follow-up CT in March 2026 which she needs for nodule anyway  4.  Millimeter left lung nodule we will capture this and this follow-up CT scan in March 2026.  According to my CMA Sonny the order is there  #Coronary artery calcification on CT-no chest pain.  She has an upcoming appointment with Dr. Anner     SYMPTOM SCALE - ILD 09/20/2023  Current weight   O2 use ra  Shortness of Breath 0 -> 5 scale with 5 being worst (score 6 If unable to do)  At rest 0  Simple tasks - showers, clothes change, eating, shaving 0  Household (dishes, doing bed, laundry) 00  Shopping 0  Walking level at own pace 0  Walking up Stairs 0  Total (30-36) Dyspnea Score 0  How bad is your cough? 0  How bad is your fatigue 0  How bad is nausea 0  How bad is vomiting?  0  How bad is diarrhea? 0  How bad is anxiety? 0  How bad is depression 00  Any chronic pain - if so where and how bad 0         SIT STAND TEST - goal 15 times   09/20/2023    O2 used ra   PRobe - finter or forehead forhead   Number sit and stand completed - goal 15 15   Time taken to complete 40 sec   Resting Pulse Ox/HR/Dyspnea  99% and 72/min and dyspnea of 1/10    Peak measures 97 % and 102/min and dyspnea of 1/10   Final Pulse Ox/HR 97% and 84/min and dyspnea of 1/10   Desaturated </= 88% no   Desaturated <= 3% points no   Got Tachycardic >/=  90/min yes   Miscellaneous comments x        Latest Reference Range & Units 12/16/12 09:58 02/16/23 11:06 09/20/23 10:19  Anti Nuclear Antibody (ANA) NEGATIVE   POSITIVE ! POSITIVE !  ANA Pattern 1   Nuclear, Nucleolar ! Cytoplasmic !  ANA Titer 1 titer  1:80 (H) 1:160 (H)  RA Latex Turbid. <14 IU/mL  <10   IgE (Immunoglobulin E), Serum 0.0 - 180.0 IU/mL 71.6    SSA (Ro) (ENA) Antibody, IgG <1.0 NEG AI   <1.0 NEG  SSB (La) (ENA) Antibody, IgG <1.0 NEG AI   <1.0 NEG  Scleroderma (Scl-70) (ENA) Antibody, IgG <1.0 NEG AI   <1.0 NEG  !: Data is abnormal (H): Data is abnormally high PFT     Latest Ref Rng & Units 12/06/2023    3:33 PM 01/18/2023    8:03 AM 01/05/2023   11:03 AM 03/27/2022    2:56 PM  PFT Results  FVC-Pre L 3.14  3.29  3.11  3.05   FVC-Predicted Pre % 93  97  92  90   FVC-Post L 3.20  3.19  3.12  2.95   FVC-Predicted Post % 95  94  92  87   Pre FEV1/FVC % % 85  68  39  86   Post FEV1/FCV % % 86  59  50  89   FEV1-Pre L 2.67  2.23  1.20  2.63   FEV1-Predicted Pre % 103  85  46  101   FEV1-Post L 2.77  1.89  1.57  2.64   DLCO uncorrected ml/min/mmHg 19.91  17.51  19.06  18.43   DLCO UNC% % 97  84  91  88   DLCO corrected ml/min/mmHg 20.50  17.51  19.06  18.43   DLCO COR %Predicted % 100  84  91  88   DLVA Predicted % 101  89  97  98   TLC L 5.15  4.86  5.04  4.94   TLC % Predicted % 99  93  96  94   RV % Predicted % 93  73  87  84        LAB RESULTS last 96 hours No results found.       has a past Williams history of Anxiety, Asthma, Back pain, Bilateral ovarian cysts, Bruxism, Constipation, Diabetes mellitus type II, Diverticulosis (2014), Dry mouth, Elevated blood pressure reading without diagnosis of  hypertension, Endometriosis, Fibroids, GERD (gastroesophageal reflux disease) (0), History of ovarian cyst, HLD (hyperlipidemia), adenomatous polyp of colon (03/24/2019), Hyperglycemia, Hyperlipidemia, Hypertension, Iron deficiency anemia, Joint pain,  Menorrhagia, Obesity, Obesity, OSA on CPAP, Plantar fasciitis, PMB (postmenopausal bleeding), Pneumonia, PONV (postoperative nausea and vomiting), Pre-diabetes, Rectovaginal fistula, Sleep apnea, Uterine adhesion, Uterine fibroid, and Wears glasses.   reports that she has never smoked. She has never used smokeless tobacco.  Past Surgical History:  Procedure Laterality Date   COLONOSCOPY  01/2009   DILATATION & CURETTAGE/HYSTEROSCOPY WITH MYOSURE N/A 09/22/2018   Procedure: DILATATION & CURETTAGE/HYSTEROSCOPY;  Surgeon: Kandyce Sor, MD;  Location[facility];  Service: Gynecology;  Laterality: N/A;   HEMORRHOID BANDING     HEMORRHOID SURGERY     LAPAROSCOPY     endometriosis   NASAL SEPTOPLASTY W/ TURBINOPLASTY Bilateral 09/02/2021   Procedure: NASAL SEPTOPLASTY WITH SUBMUCOSAL RESECTION OF TURBINATES;  Surgeon: Herminio Miu, MD;  Location: [facility];  Service: ENT;  Laterality: Bilateral;   SEPTOPLASTY     april 2023   TONSILLECTOMY AND ADENOIDECTOMY     TRIGGER FINGER RELEASE     UPPER GI ENDOSCOPY  01/2009   uterine biopsy     uterine cyst exploration     UTERINE SUSPENSION     WISDOM TOOTH EXTRACTION      No Known Allergies  Immunization History  Administered Date(s) Administered   Influenza Split 01/10/2012, 01/23/2013   Influenza Whole 03/02/2008, 02/21/2010   Influenza,inj,Quad PF,6+ Mos 01/13/2019, 01/19/2020   Influenza,trivalent, recombinat, inj, PF 02/04/2023   Influenza-Unspecified 01/23/2014, 01/24/2015, 01/30/2021, 01/30/2022   Moderna Covid-19 Fall Seasonal Vaccine 47yrs & older 03/10/2022, 02/04/2023   Moderna Sars-Covid-2 Vaccination 05/11/2019, 06/09/2019, 03/08/2020, 02/03/2021   Pneumococcal Polysaccharide-23 03/02/2008, 05/11/2011, 12/17/2017   Td 06/11/1997   Tdap 05/11/2011, 07/21/2018   Zoster Recombinant(Shingrix) 01/03/2018, 03/30/2018    Family History  Problem Relation Age of Onset   Thyroid  disease Mother        ?    Diabetes Mother    Hypertension Mother    Hyperlipidemia Mother    Anxiety disorder Mother    Sleep apnea Mother    Obesity Mother    Heart disease Mother    Pulmonary fibrosis Mother 35   Pulmonary fibrosis Father 53   Hypertension Father    Obesity Father    Prostate cancer Father 43 - 79   Cancer Father    Heart attack Maternal Grandfather 23 - 52   Parkinson's disease Paternal Grandfather    Emphysema Paternal Grandfather    Stomach cancer Neg Hx    Colon cancer Neg Hx    Esophageal cancer Neg Hx      Current Outpatient Medications:    albuterol  (VENTOLIN  HFA) 108 (90 Base) MCG/ACT inhaler, Inhale 2 puffs into the lungs every 4 (four) hours as needed., Disp: 8.5 g, Rfl: 5   AMBULATORY NON FORMULARY MEDICATION, Single glucometer with lancets, test strips-Check FBS and 2 hour PP(BID), Disp: 1 each, Rfl: 0   budesonide -formoterol  (SYMBICORT ) 80-4.5 MCG/ACT inhaler, USE 2 INHALATIONS TWICE A DAY, Disp: 10.2 g, Rfl: 5   Coenzyme Q10 (CO Q 10 PO), Take 300 mg by mouth daily., Disp: , Rfl:    docusate sodium (COLACE) 100 MG capsule, Take 100 mg by mouth daily as needed for mild constipation., Disp: , Rfl:    famotidine (PEPCID) 40 MG tablet, Take 40 mg by mouth daily., Disp: , Rfl:    MAGNESIUM GLYCINATE PO, Take 240 mg by mouth in the  morning and at bedtime., Disp: , Rfl:    Multiple Vitamin (MULTIVITAMIN) tablet, Take 1 tablet by mouth daily., Disp: , Rfl:    naproxen  (NAPROSYN ) 500 MG tablet, Take 1 tablet (500 mg total) by mouth 2 (two) times daily as needed for moderate pain. With a meal, Disp: 60 tablet, Rfl: 3   olmesartan  (BENICAR ) 40 MG tablet, Take 1 tablet (40 mg total) by mouth daily., Disp: 90 tablet, Rfl: 3   pantoprazole  (PROTONIX ) 40 MG tablet, Take 1 tablet (40 mg total) by mouth 2 (two) times daily before a meal. Breakfast and supper, Disp: 60 tablet, Rfl: 11   rosuvastatin  (CRESTOR ) 10 MG tablet, Take 1 tablet (10 mg total) by mouth daily., Disp: 90 tablet, Rfl: 3    Semaglutide , 2 MG/DOSE, (OZEMPIC , 2 MG/DOSE,) 8 MG/3ML SOPN, Inject 2 mg into the skin once a week., Disp: 3 mL, Rfl: 0   Cholecalciferol (VITAMIN D3) 50 MCG (2000 UT) capsule, 2 po every day ( 4 - 5000 IU every day), Disp: , Rfl:       Objective:   There were no vitals filed for this visit.  Estimated body mass index is 32.02 kg/m as calculated from the following:   Height as of 12/06/23: 5' 5 (1.651 m).   Weight as of 12/06/23: 192 lb 6.4 oz (87.3 kg).  @WEIGHTCHANGE @  There were no vitals filed for this visit.   Physical Exam   General: No distress.  Sounded normal on the phone     Assessment/     Assessment & Plan IPF (idiopathic pulmonary fibrosis) (HCC)  Nodule of lower lobe of left lung    PLAN Patient Instructions  Family history of pulmonary fibrosis - ILD (interstitial lung disease) (HCC) -  Positive ANA 1: 80 in 2024 and 1:160 in May 2025  - I think of interstitial lung abnormality and heart disease.   - Pulmonary function test is normal  - And you are asymptomatic - Seen by [facility] - you are deciding whether to go ahead with $2000 test  Plan - - Do high-resolution CT chest March 2026 - Bring ILD questionnaire bring it back  - hold off genetic test due to cost   Nodule of lower lobe of left lung 4mm in fEb 2025 - c/w Benign  Plan  - capture information March 2026 HRCT for ILA   Coronary artery calcification seen on CAT scan   Plan  - keep appt with Dr Anner in cardiology   Follow-up -  March 2026 after HRCT   - 15 min visit    FOLLOWUP    Return for July 28 2613-minute visit after CT chest.   (Telephone visit - Level 02 visit: Estb 11-20 for this visit type which was visit type: telephone visit in total care time and counseling or/and coordination of care by this undersigned MD - Dr Dorethia Cave. This includes one or more of the following for care delivered on 12/09/2023 same day: pre-charting, chart review, note  writing, documentation discussion of test results, diagnostic or treatment recommendations, prognosis, risks and benefits of management options, instructions, education, compliance or risk-factor reduction. It excludes time spent by the CMA or office staff in the care of the patient. Actual time was 11 min. E&M code is [id number])   SIGNATURE    Dr. Dorethia Cave, M.D., F.C.C.P,  Pulmonary and Critical Care Medicine Staff Physician, [facility] Director - Interstitial Lung Disease  Program  [facility] [place], [place], [address]  Pager: [phone], If no answer or between  15:00h - 7:00h: call [phone] Telephone: [phone]  5:09 PM 12/09/2023

## 2023-12-19 ENCOUNTER — Encounter (INDEPENDENT_AMBULATORY_CARE_PROVIDER_SITE_OTHER): Payer: Self-pay

## 2023-12-20 DIAGNOSIS — G4733 Obstructive sleep apnea (adult) (pediatric): Secondary | ICD-10-CM | POA: Diagnosis not present

## 2023-12-27 DIAGNOSIS — E119 Type 2 diabetes mellitus without complications: Secondary | ICD-10-CM | POA: Diagnosis not present

## 2023-12-28 ENCOUNTER — Encounter (INDEPENDENT_AMBULATORY_CARE_PROVIDER_SITE_OTHER): Payer: Self-pay | Admitting: Family Medicine

## 2023-12-28 ENCOUNTER — Ambulatory Visit (INDEPENDENT_AMBULATORY_CARE_PROVIDER_SITE_OTHER): Admitting: Family Medicine

## 2023-12-28 VITALS — BP 111/72 | HR 64 | Temp 97.8°F | Ht 65.0 in | Wt 184.0 lb

## 2023-12-28 DIAGNOSIS — E1169 Type 2 diabetes mellitus with other specified complication: Secondary | ICD-10-CM | POA: Diagnosis not present

## 2023-12-28 DIAGNOSIS — Z6830 Body mass index (BMI) 30.0-30.9, adult: Secondary | ICD-10-CM

## 2023-12-28 DIAGNOSIS — K760 Fatty (change of) liver, not elsewhere classified: Secondary | ICD-10-CM

## 2023-12-28 DIAGNOSIS — E785 Hyperlipidemia, unspecified: Secondary | ICD-10-CM | POA: Diagnosis not present

## 2023-12-28 DIAGNOSIS — E559 Vitamin D deficiency, unspecified: Secondary | ICD-10-CM

## 2023-12-28 DIAGNOSIS — Z7985 Long-term (current) use of injectable non-insulin antidiabetic drugs: Secondary | ICD-10-CM

## 2023-12-28 DIAGNOSIS — E669 Obesity, unspecified: Secondary | ICD-10-CM

## 2023-12-28 DIAGNOSIS — E66811 Obesity, class 1: Secondary | ICD-10-CM

## 2023-12-28 NOTE — Progress Notes (Signed)
 Patty Williams, D.O.  ABFM, ABOM Specializing in Clinical Bariatric Medicine  Office located at: [address] [place], [place]  [address]   Assessment and Plan:   Medications Discontinued During This Encounter  Medication Reason   Cholecalciferol (VITAMIN D3) 50 MCG (2000 UT) capsule     Consider obtaining fasting IC in the near future.   FOR THE DISEASE OF OBESITY:  Obesity, Beginning BMI 38.11 Obesity (BMI 30.0-34.9) -current BMI 30.62 Assessment & Plan: Since last office visit on 12/02/23 patient's muscle mass has increased by 1 lbs. Fat mass has decreased by 2.6 lbs. Total body water has decreased by .4 lbs.  Body fat % has decreased by 1 %. Counseling done on how various foods will affect these numbers and how to maximize success  Total lbs lost to date: - 45  lbs Total weight loss percentage to date: -19.65 %   Recommended Dietary Goals Patty Williams is currently in the action stage of change. As such, her goal is to continue weight management plan.  She has agreed to: continue current plan; pt was provided the following parameters to use as a guide: 1000 calories and 85-135 grams protein.    Behavioral Intervention We discussed the following today: staying on track during vacation/travel,  work on meal planning and preparation and using GPT or another AI platform for recipe ideas- searching low calorie, low carb, high protein chicken recipes etc  Additional resources provided today: Handout on Common Characteristics of Successful Weight Losers and Maintainers   Evidence-based interventions for health behavior change were utilized today including the discussion of self monitoring techniques, problem-solving barriers and SMART goal setting techniques.   Regarding patient's less desirable eating habits and patterns, we employed the technique of small changes.   Goal: n/a   Recommended Physical Activity Goals Patty Williams has been advised to work up to 300-450 minutes of  moderate intensity aerobic activity a week and strengthening exercises 2-3 times per week for cardiovascular health, weight loss maintenance and preservation of muscle mass.   She was encouraged to continue to gradually increase the amount and intensity of exercise routine   Pharmacotherapy See T2DM note.   ASSOCIATED CONDITIONS ADDRESSED TODAY:   Type 2 diabetes mellitus with obesity [facility]) Assessment & Plan: Lab Results  Component Value Date   HGBA1C 5.9 (H) 12/02/2023   HGBA1C 5.9 (H) 07/08/2023   HGBA1C 5.9 03/05/2023   INSULIN  13.8 12/02/2023   INSULIN  27.3 (H) 07/08/2023   INSULIN  10.3 07/02/2022   Lab Results  Component Value Date   CREATININE 0.55 (L) 12/02/2023   BUN 14 12/02/2023   NA 142 12/02/2023   K 4.2 12/02/2023   CL 104 12/02/2023   CO2 23 12/02/2023   Lab Results  Component Value Date   WBC 5.5 12/02/2023   HGB 12.5 12/02/2023   HCT 38.1 12/02/2023   MCV 92 12/02/2023   PLT 274 12/02/2023   Lab Results  Component Value Date   TSH 1.100 12/02/2023   Lab Results  Component Value Date   VITAMINB12 1,449 (H) 12/02/2023   On Ozempic  2 mg weekly with good adherence and no side effects. Hemoglobin A1c is unchanged from prior at 5.9. Fasting insulin  has improved to 13.8; eventual goal <5. Kidney function, electrolytes, blood counts, and TSH are WNL. Her B12 is elevated; she may continue her daily MVM but was instructed to limit her intake of other forms of exogenous B12 such as energy drinks. She will cont GLP-RA and  her reduced calorie meal plan low on processed crabs and simple sugars. Ongoing reduction in adipose tissue will improve condition.     Metabolic dysfunction-associated steatotic liver disease (MASLD) Assessment & Plan:    Component Value Date/Time   PROT 6.6 12/02/2023 1040   ALBUMIN 4.2 12/02/2023 1040   AST 27 12/02/2023 1040   ALT 28 12/02/2023 1040   ALKPHOS 78 12/02/2023 1040   BILITOT 0.4 12/02/2023 1040   BILIDIR 0.1  03/02/2008 1018    Abdominal ultrasound dated 08/19/2023 showed fatty liver disease.  Most recent liver enzymes were WNL. Continue with reducing saturated fats, simple and added sugars. Will continue to monitor expectantly.     Hyperlipidemia associated with type 2 diabetes mellitus [facility]) Assessment & Plan: Lab Results  Component Value Date   CHOL 138 12/02/2023   HDL 45 12/02/2023   LDLCALC 82 12/02/2023   LDLDIRECT 144.2 09/21/2008   TRIG 52 12/02/2023   CHOLHDL 4 03/05/2023   Her Crestor  was increased from 5 mg to 10 mg daily by her PCP a few months ago. She does experience some muscle cramps from the Crestor  and was encouraged to adequately hydrate. Reviewed most recent lipid panel. Goal HDL > 60. Goal LDL for a diabetic <70. CT cardiac scoring dated 03/2023 showed a coronary calcium  score of 9.9 which was 67th percentile for age and sex. Of note, her grandfather has a history of CVD in his early 35s. She will continue statin therapy and was encouraged to continue decreasing saturated and trans fats in her diet. Recommend pt to explore the American Heart Association website for further information.    Vitamin D  deficiency Assessment & Plan: Lab Results  Component Value Date   VD25OH 108.0 (H) 12/02/2023   VD25OH 67.2 07/08/2023   VD25OH 34.66 03/05/2023   On 12/07/2023, Dr.Beasley instructed pt to stop all vit D supplementation since levels were elevated at 108. I recommend she get in around 1,000 lU of Vit D daily because of her post-menopausal state; this can be in the form of foods fortified with Vitamin D . Recheck levels: 2-3 months.     Follow up:   Return 01/25/2024 at 3:40 PM with Patty Williams D, MD.  She was informed of the importance of frequent follow up visits to maximize her success with intensive lifestyle modifications for her multiple health conditions.   Subjective:   Chief complaint: Obesity [name] is here to discuss her progress with her obesity treatment  plan. She is on the Category 2 Plan and states she is following her eating plan approximately 65% of the time. She states she is walking 20 minutes 3-4 days per week   Interval History:  [name] is here for a follow up office visit. Since last OV on 12/02/23 , she is down 2 pounds. She had a few health issues including COVID and diarrhea but is now doing well. Food wise, she is getting in her proteins (she averages 102 grams per day). She also endorses not skipping meals. She is sleeping 7-9 hours/nite.    Pharmacotherapy that aid with weight loss: She is currently taking  Semaglutide  2 mg weekly   Review of Systems:  Pertinent positives were addressed with patient today.  Reviewed by clinician on day of visit: allergies, medications, problem list, medical history, surgical history, family history, social history, and previous encounter notes.  Weight Summary and Biometrics   Weight Lost Since Last Visit: 2lb  Weight Gained Since Last Visit: 0   Vitals  Temp: 97.8 F (36.6 C) BP: 111/72 Pulse Rate: 64 SpO2: 96 %   Anthropometric Measurements Height: 5' 5 (1.651 m) Weight: 184 lb (83.5 kg) BMI (Calculated): 30.62 Weight at Last Visit: 186lb Weight Lost Since Last Visit: 2lb Weight Gained Since Last Visit: 0 Starting Weight: 229lb Total Weight Loss (lbs): 45 lb (20.4 kg) Peak Weight: 232lb   Body Composition  Body Fat %: 40.4 % Fat Mass (lbs): 74.4 lbs Muscle Mass (lbs): 104.4 lbs Total Body Water (lbs): 74.6 lbs Visceral Fat Rating : 11   Other Clinical Data Fasting: no Labs: no Today's Visit #: 45 Starting Date: 06/27/20    Objective:   PHYSICAL EXAM: Blood pressure 111/72, pulse 64, temperature 97.8 F (36.6 C), height 5' 5 (1.651 m), weight 184 lb (83.5 kg), last menstrual period 10/08/2018, SpO2 96%. Body mass index is 30.62 kg/m.  General: she is overweight, cooperative and in no acute distress. PSYCH: Has normal mood, affect and thought  process.   HEENT: EOMI, sclerae are anicteric. Lungs: Normal breathing effort, no conversational dyspnea. Extremities: Moves * 4 Neurologic: A and O * 3, good insight  DIAGNOSTIC DATA REVIEWED: BMET    Component Value Date/Time   NA 142 12/02/2023 1040   K 4.2 12/02/2023 1040   CL 104 12/02/2023 1040   CO2 23 12/02/2023 1040   GLUCOSE 88 12/02/2023 1040   GLUCOSE 98 03/05/2023 0824   BUN 14 12/02/2023 1040   CREATININE 0.55 (L) 12/02/2023 1040   CALCIUM  9.3 12/02/2023 1040   GFRNONAA 98 06/27/2020 0840   GFRAA 113 06/27/2020 0840   Lab Results  Component Value Date   HGBA1C 5.9 (H) 12/02/2023   HGBA1C 6.5 (H) 03/02/2008   Lab Results  Component Value Date   INSULIN  13.8 12/02/2023   INSULIN  19.9 06/27/2020   Lab Results  Component Value Date   TSH 1.100 12/02/2023   CBC    Component Value Date/Time   WBC 5.5 12/02/2023 1040   WBC 6.3 03/05/2023 0824   RBC 4.14 12/02/2023 1040   RBC 4.33 03/05/2023 0824   HGB 12.5 12/02/2023 1040   HCT 38.1 12/02/2023 1040   PLT 274 12/02/2023 1040   MCV 92 12/02/2023 1040   MCH 30.2 12/02/2023 1040   MCH 28.9 09/23/2018 0404   MCHC 32.8 12/02/2023 1040   MCHC 32.1 03/05/2023 0824   RDW 13.3 12/02/2023 1040   Iron Studies    Component Value Date/Time   IRON 92 07/08/2023 1006   TIBC 382 07/08/2023 1006   FERRITIN 40 07/08/2023 1006   IRONPCTSAT 24 07/08/2023 1006   Lipid Panel     Component Value Date/Time   CHOL 138 12/02/2023 1040   TRIG 52 12/02/2023 1040   HDL 45 12/02/2023 1040   CHOLHDL 4 03/05/2023 0824   VLDL 18.4 03/05/2023 0824   LDLCALC 82 12/02/2023 1040   LDLDIRECT 144.2 09/21/2008 1008   Hepatic Function Panel     Component Value Date/Time   PROT 6.6 12/02/2023 1040   ALBUMIN 4.2 12/02/2023 1040   AST 27 12/02/2023 1040   ALT 28 12/02/2023 1040   ALKPHOS 78 12/02/2023 1040   BILITOT 0.4 12/02/2023 1040   BILIDIR 0.1 03/02/2008 1018      Component Value Date/Time   TSH 1.100 12/02/2023  1040   Nutritional Lab Results  Component Value Date   VD25OH 108.0 (H) 12/02/2023   VD25OH 67.2 07/08/2023   VD25OH 34.66 03/05/2023    Attestations:   I, Special Derek,  acting as a Stage manager for Patty Jenkins, DO., have compiled all relevant documentation for today's office visit on behalf of Patty Jenkins, DO, while in the presence of [employer], DO.  I have spent 49 minutes in the care of the patient today including 39 minutes face-to-face assessing and reviewing listed medical problems above as outlined in office visit note and providing nutritional and behavioral counseling as outlined in obesity care plan.   I have reviewed the above documentation for accuracy and completeness, and I agree with the above. Patty JINNY Williams, D.O.  The 21st Century Cures Act was signed into law in 2016 which includes the topic of electronic health records.  This provides immediate access to information in MyChart.  This includes consultation notes, operative notes, office notes, lab results and pathology reports.  If you have any questions about what you read please let us  know at your next visit so we can discuss your concerns and take corrective action if need be.  We are right here with you.

## 2024-01-06 ENCOUNTER — Ambulatory Visit: Admitting: Cardiology

## 2024-01-06 ENCOUNTER — Encounter: Payer: Self-pay | Admitting: Internal Medicine

## 2024-01-11 NOTE — Telephone Encounter (Signed)
 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 2) indication for PFT is Family histopry of pulm fibrosis and Interstital lung abnromaitiss -pls submit with correct code. See my note

## 2024-01-20 DIAGNOSIS — G4733 Obstructive sleep apnea (adult) (pediatric): Secondary | ICD-10-CM | POA: Diagnosis not present

## 2024-01-25 ENCOUNTER — Encounter (INDEPENDENT_AMBULATORY_CARE_PROVIDER_SITE_OTHER): Payer: Self-pay | Admitting: Family Medicine

## 2024-01-25 ENCOUNTER — Ambulatory Visit (INDEPENDENT_AMBULATORY_CARE_PROVIDER_SITE_OTHER): Admitting: Family Medicine

## 2024-01-25 VITALS — BP 107/71 | HR 68 | Temp 97.6°F | Ht 65.0 in | Wt 188.0 lb

## 2024-01-25 DIAGNOSIS — Z7985 Long-term (current) use of injectable non-insulin antidiabetic drugs: Secondary | ICD-10-CM

## 2024-01-25 DIAGNOSIS — E1169 Type 2 diabetes mellitus with other specified complication: Secondary | ICD-10-CM

## 2024-01-25 DIAGNOSIS — E119 Type 2 diabetes mellitus without complications: Secondary | ICD-10-CM

## 2024-01-25 DIAGNOSIS — Z6831 Body mass index (BMI) 31.0-31.9, adult: Secondary | ICD-10-CM

## 2024-01-25 DIAGNOSIS — E669 Obesity, unspecified: Secondary | ICD-10-CM

## 2024-01-25 MED ORDER — OZEMPIC (2 MG/DOSE) 8 MG/3ML ~~LOC~~ SOPN: 2.0000 mg | PEN_INJECTOR | SUBCUTANEOUS | 0 refills | Status: DC

## 2024-01-25 NOTE — Progress Notes (Signed)
 Office: [phone]  /  Fax: [phone]  WEIGHT SUMMARY AND BIOMETRICS  Anthropometric Measurements Height: 5' 5 (1.651 m) Weight: 188 lb (85.3 kg) BMI (Calculated): 31.28 Weight at Last Visit: 184 lb Weight Lost Since Last Visit: 0 Weight Gained Since Last Visit: 4 lb Starting Weight: 229 lb Total Weight Loss (lbs): 41 lb (18.6 kg) Peak Weight: 232 lb   Body Composition  Body Fat %: 41.7 % Fat Mass (lbs): 78.4 lbs Muscle Mass (lbs): 104 lbs Total Body Water (lbs): 79 lbs Visceral Fat Rating : 11   Other Clinical Data Fasting: no Labs: no Today's Visit #: 4+ Starting Date: 06/27/20    Chief Complaint: OBESITY    History of Present Illness Patty Williams is a 63 year old female with obesity and type two diabetes who presents for obesity treatment plan assessment and progress evaluation.  She has been adhering to a category two eating plan approximately sixty percent of the time over the past month. She has gained four pounds since her last visit. She is attempting to increase her physical activity by walking for twenty minutes three to four days per week. A recent ten-day camp disrupted her routine, making it challenging to maintain her diet. During this period, she focused on protein intake and stayed active by walking and kayaking, achieving twelve thousand steps daily.  In managing type two diabetes, she primarily relies on behavior modifications and lifestyle adjustments. She is currently on Ozempic  and requests a refill. Despite consuming forty-five grams of protein for breakfast, she experiences hunger two to three hours later.  She faces social challenges in maintaining her diet, particularly when surrounded by others who do not share her health goals. She finds it difficult to avoid unhealthy foods during social gatherings and feels pressured to conform to others' eating habits. She has maintained a seventy-six day streak of no sugar, which she is proud of,  but acknowledges the challenge of maintaining it.  She is exploring nonperishable snack options that align with her dietary goals, such as beef or [place] jerky and protein bars. She is also considering the impact of her social environment on her dietary choices and is seeking ways to manage these influences.      PHYSICAL EXAM:  Blood pressure 107/71, pulse 68, temperature 97.6 F (36.4 C), height 5' 5 (1.651 m), weight 188 lb (85.3 kg), last menstrual period 10/08/2018, SpO2 97%. Body mass index is 31.28 kg/m.  DIAGNOSTIC DATA REVIEWED:  BMET    Component Value Date/Time   NA 142 12/02/2023 1040   K 4.2 12/02/2023 1040   CL 104 12/02/2023 1040   CO2 23 12/02/2023 1040   GLUCOSE 88 12/02/2023 1040   GLUCOSE 98 03/05/2023 0824   BUN 14 12/02/2023 1040   CREATININE 0.55 (L) 12/02/2023 1040   CALCIUM  9.3 12/02/2023 1040   GFRNONAA 98 06/27/2020 0840   GFRAA 113 06/27/2020 0840   Lab Results  Component Value Date   HGBA1C 5.9 (H) 12/02/2023   HGBA1C 6.5 (H) 03/02/2008   Lab Results  Component Value Date   INSULIN  13.8 12/02/2023   INSULIN  19.9 06/27/2020   Lab Results  Component Value Date   TSH 1.100 12/02/2023   CBC    Component Value Date/Time   WBC 5.5 12/02/2023 1040   WBC 6.3 03/05/2023 0824   RBC 4.14 12/02/2023 1040   RBC 4.33 03/05/2023 0824   HGB 12.5 12/02/2023 1040   HCT 38.1 12/02/2023 1040   PLT 274 12/02/2023 1040  MCV 92 12/02/2023 1040   MCH 30.2 12/02/2023 1040   MCH 28.9 09/23/2018 0404   MCHC 32.8 12/02/2023 1040   MCHC 32.1 03/05/2023 0824   RDW 13.3 12/02/2023 1040   Iron Studies    Component Value Date/Time   IRON 92 07/08/2023 1006   TIBC 382 07/08/2023 1006   FERRITIN 40 07/08/2023 1006   IRONPCTSAT 24 07/08/2023 1006   Lipid Panel     Component Value Date/Time   CHOL 138 12/02/2023 1040   TRIG 52 12/02/2023 1040   HDL 45 12/02/2023 1040   CHOLHDL 4 03/05/2023 0824   VLDL 18.4 03/05/2023 0824   LDLCALC 82 12/02/2023  1040   LDLDIRECT 144.2 09/21/2008 1008   Hepatic Function Panel     Component Value Date/Time   PROT 6.6 12/02/2023 1040   ALBUMIN 4.2 12/02/2023 1040   AST 27 12/02/2023 1040   ALT 28 12/02/2023 1040   ALKPHOS 78 12/02/2023 1040   BILITOT 0.4 12/02/2023 1040   BILIDIR 0.1 03/02/2008 1018      Component Value Date/Time   TSH 1.100 12/02/2023 1040   Nutritional Lab Results  Component Value Date   VD25OH 108.0 (H) 12/02/2023   VD25OH 67.2 07/08/2023   VD25OH 34.66 03/05/2023     Assessment and Plan Assessment & Plan Obesity Obesity management is ongoing. She has been following the category two eating plan about 60% of the time over the last month. She has gained four pounds since her last visit, likely due to recent social situations and dietary challenges during a camp trip. Despite the weight gain, she has been active, walking 12,000 steps daily during the camp and engaging in activities like kayaking. She is aware of the social and environmental challenges that contribute to weight gain and is actively seeking strategies to manage these situations. She is focusing on protein intake and exploring nonperishable snack options to support her dietary goals. - Continue category two eating plan. - Encourage increased adherence to the eating plan. - Promote regular physical activity, aiming for at least 20 minutes of walking 3-4 days per week. - Discuss strategies for managing social situations that challenge dietary adherence. - Recommend nonperishable snacks like Chomps meat sticks and protein bars for travel and social events.  Type 2 diabetes mellitus Type 2 diabetes is being managed with behavior modifications, lifestyle adjustments, and medication. She is currently on Ozempic  and requests a refill. She is actively working on controlling her blood sugar levels and is aware of the importance of protein intake and meal sequencing to manage glucose spikes. She is also exploring ways  to incorporate more fiber into her diet to aid in blood sugar control. She understands that exercise can increase hunger and is considering strategies to manage this, such as consuming protein drinks post-exercise. - Refill Ozempic  prescription. - Encourage continued behavior modifications and lifestyle adjustments to manage blood sugar levels. - Discuss the importance of protein intake and meal sequencing to manage glucose spikes. - Advise on incorporating more fiber into the diet, such as beans and raw vegetables. - Educate on the benefits of pork rinds over chips for better insulin  response.       Andreia was informed of the importance of frequent follow up visits to maximize her success with intensive lifestyle modifications for her obesity and obesity related health conditions as recommended by USPSTF and CMS guidelines   Louann Penton, MD

## 2024-01-26 ENCOUNTER — Telehealth: Payer: Self-pay | Admitting: *Deleted

## 2024-01-26 NOTE — Telephone Encounter (Signed)
 LMOVM to verify card hx.

## 2024-01-27 ENCOUNTER — Telehealth: Payer: Self-pay | Admitting: Cardiovascular Disease

## 2024-01-27 NOTE — Telephone Encounter (Signed)
 Pt returning call from voicemail left by Marina  Wilfred. Please advise.

## 2024-01-27 NOTE — Telephone Encounter (Signed)
 Pt requesting to talk on Mychart and I asked the pt about her card history and she's never seen a Cardiologist, she's only had a CT done and wanted to be seen due to the results.

## 2024-01-28 NOTE — Telephone Encounter (Signed)
 Spoke with the patient. She reported that she has never seen a cardiologist, and that her PCP records are available via Epic

## 2024-01-28 NOTE — Telephone Encounter (Signed)
 Thank you! Pt's phone number goes straight to VM. I have sent mychart message.

## 2024-01-28 NOTE — Telephone Encounter (Signed)
 Pt returning call again. Requesting callback before 10am if possible. She also says she is okay with a message being sent on mychart. Please advise.

## 2024-01-28 NOTE — Telephone Encounter (Signed)
 Pt returning nurse call

## 2024-02-03 ENCOUNTER — Ambulatory Visit: Attending: Cardiovascular Disease | Admitting: Cardiovascular Disease

## 2024-02-03 ENCOUNTER — Encounter: Payer: Self-pay | Admitting: Cardiovascular Disease

## 2024-02-03 VITALS — BP 112/74 | HR 61 | Ht 65.0 in | Wt 191.2 lb

## 2024-02-03 DIAGNOSIS — I517 Cardiomegaly: Secondary | ICD-10-CM | POA: Diagnosis not present

## 2024-02-03 DIAGNOSIS — I10 Essential (primary) hypertension: Secondary | ICD-10-CM

## 2024-02-03 DIAGNOSIS — R072 Precordial pain: Secondary | ICD-10-CM | POA: Diagnosis not present

## 2024-02-03 DIAGNOSIS — E7849 Other hyperlipidemia: Secondary | ICD-10-CM | POA: Diagnosis not present

## 2024-02-03 DIAGNOSIS — I447 Left bundle-branch block, unspecified: Secondary | ICD-10-CM | POA: Diagnosis not present

## 2024-02-03 MED ORDER — METOPROLOL TARTRATE 25 MG PO TABS
ORAL_TABLET | ORAL | 0 refills | Status: DC
Start: 2024-02-03 — End: 2024-03-09

## 2024-02-03 NOTE — Patient Instructions (Signed)
 Medication Instructions:  No changes *If you need a refill on your cardiac medications before your next appointment, please call your pharmacy*  Lab Work: Your provider would like for you to have the following labs today: BMET  If you have labs (blood work) drawn today and your tests are completely normal, you will receive your results only by: MyChart Message (if you have MyChart) OR A paper copy in the mail If you have any lab test that is abnormal or we need to change your treatment, we will call you to review the results.  Follow-Up: At [facility], you and your health needs are our priority.  As part of our continuing mission to provide you with exceptional heart care, our providers are all part of one team.  This team includes your primary Cardiologist (physician) and Advanced Practice Providers or APPs (Physician Assistants and Nurse Practitioners) who all work together to provide you with the care you need, when you need it.  Your next appointment:   6 month(s)  Provider:   You may see Dr. Darron or one of the following Advanced Practice Providers on your designated Care Team:   Lonni Meager, NP Lesley Maffucci, PA-C Bernardino Bring, PA-C Cadence [place], PA-C Tylene Lunch, NP Barnie Hila, NP    We recommend signing up for the patient portal called MyChart.  Sign up information is provided on this After Visit Summary.  MyChart is used to connect with patients for Virtual Visits (Telemedicine).  Patients are able to view lab/test results, encounter notes, upcoming appointments, etc.  Non-urgent messages can be sent to your provider as well.   To learn more about what you can do with MyChart, go to [email or web address].   Other Instructions   Your cardiac CT will be scheduled at one of the below locations:   [facility] [address] [place], [place] [address] [phone]  If scheduled at [facility], please  arrive to the Heart and Vascular Center 15 mins early for check-in and test prep.  There is spacious parking and easy access to the radiology department from the [facility] entrance. Please enter here and check-in with the desk attendant.  .  Please follow these instructions carefully (unless otherwise directed):  An IV will be required for this test and Nitroglycerin will be given.   On the Night Before the Test: Be sure to Drink plenty of water. Do not consume any caffeinated/decaffeinated beverages or chocolate 12 hours prior to your test. Do not take any antihistamines 12 hours prior to your test.  On the Day of the Test: Drink plenty of water until 1 hour prior to the test. Do not eat any food 1 hour prior to test. You may take your regular medications prior to the test.  Take metoprolol  (Lopressor ) two hours prior to test. Patients who wear a continuous glucose monitor MUST remove the device prior to scanning. FEMALES- please wear underwire-free bra if available, avoid dresses & tight clothing        After the Test: Drink plenty of water. After receiving IV contrast, you may experience a mild flushed feeling. This is normal. On occasion, you may experience a mild rash up to 24 hours after the test. This is not dangerous. If this occurs, you can take Benadryl 25 mg, Zyrtec, Claritin, or Allegra and increase your fluid intake. (Patients taking Tikosyn should avoid Benadryl, and may take Zyrtec, Claritin, or Allegra) If you experience trouble breathing,  this can be serious. If it is severe call 911 IMMEDIATELY. If it is mild, please call our office.  We will call to schedule your test 2-4 weeks out understanding that some insurance companies will need an authorization prior to the service being performed.   For more information and frequently asked questions, please visit our website : [email or web address]  For non-scheduling related questions, please  contact the cardiac imaging nurse navigator should you have any questions/concerns: Cardiac Imaging Nurse Navigators Direct Office Dial: [phone]   For scheduling needs, including cancellations and rescheduling, please call [place], [phone].

## 2024-02-03 NOTE — Progress Notes (Signed)
 Cardiology Office Note   Date:  02/03/2024   ID:  Patty Williams, DOB January 02, 1961, MRN [id number]  PCP:  Randeen Laine LABOR, MD  Cardiologist:   Deatrice Cage, MD   Chief Complaint  Patient presents with   Follow-up    Diastolic dysfunction no complaints today. Meds reviewed verbally with pt.      History of Present Illness: Patty Williams is a 63 y.o. female who was referred for evaluation of abnormal echocardiogram. 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  She still works in Programme researcher, broadcasting/film/video.  She has known history of type 2 diabetes, GERD, hyperlipidemia, essential hypertension and mild interstitial lung disease. She had CT scan of the lungs in February of this year which showed evidence of calcification in the LAD distribution.  CT calcium  score in November 2024 was slightly abnormal at 9.9 She had an echocardiogram done in March of this year which showed an EF of 50 to 55% with grade 1 diastolic dysfunction and no significant valvular abnormalities.  I personally reviewed her echocardiogram and there was significant dyssynchrony due to left bundle branch block. She had a left bundle branch block since 2022.  No previous history of ischemic heart disease.  She reports exertional dyspnea and fatigue with intense exercise when she gets her heart rate above 110 bpm.  No chest pain.  She has been diabetic since at least 2021.  Past Medical History:  Diagnosis Date   Anxiety    Asthma    Back pain    Bilateral ovarian cysts    Bruxism    Constipation    Diabetes mellitus type II    mild   Diverticulosis 2014   Mild   Dry mouth    Elevated blood pressure reading without diagnosis of hypertension    Endometriosis    Severe   Fibroids    GERD (gastroesophageal reflux disease) 0   Not sure when   History of ovarian cyst    HLD (hyperlipidemia)    Hx of adenomatous polyp of colon 03/24/2019   Hyperglycemia    Hyperlipidemia    Hypertension    Iron deficiency anemia     Joint pain    Menorrhagia    Obesity    Obesity    OSA on CPAP    Plantar fasciitis    with orthotics-much difficulty adjusting these   PMB (postmenopausal bleeding)    Pneumonia    history of   PONV (postoperative nausea and vomiting)    Pre-diabetes    Rectovaginal fistula    Sleep apnea    Uterine adhesion    Uterine fibroid    Wears glasses     Past Surgical History:  Procedure Laterality Date   COLONOSCOPY  01/2009   DILATATION & CURETTAGE/HYSTEROSCOPY WITH MYOSURE N/A 09/22/2018   Procedure: DILATATION & CURETTAGE/HYSTEROSCOPY;  Surgeon: Kandyce Sor, MD;  Location: [facility];  Service: Gynecology;  Laterality: N/A;   HEMORRHOID BANDING     HEMORRHOID SURGERY     LAPAROSCOPY     endometriosis   NASAL SEPTOPLASTY W/ TURBINOPLASTY Bilateral 09/02/2021   Procedure: NASAL SEPTOPLASTY WITH SUBMUCOSAL RESECTION OF TURBINATES;  Surgeon: Herminio Miu, MD;  Location: [facility];  Service: ENT;  Laterality: Bilateral;   SEPTOPLASTY     april 2023   TONSILLECTOMY AND ADENOIDECTOMY     TRIGGER FINGER RELEASE     UPPER GI ENDOSCOPY  01/2009   uterine biopsy     uterine cyst exploration  UTERINE SUSPENSION     WISDOM TOOTH EXTRACTION       Current Outpatient Medications  Medication Sig Dispense Refill   albuterol  (VENTOLIN  HFA) 108 (90 Base) MCG/ACT inhaler Inhale 2 puffs into the lungs every 4 (four) hours as needed. 8.5 g 5   AMBULATORY NON FORMULARY MEDICATION Single glucometer with lancets, test strips-Check FBS and 2 hour PP(BID) 1 each 0   budesonide -formoterol  (SYMBICORT ) 80-4.5 MCG/ACT inhaler USE 2 INHALATIONS TWICE A DAY 10.2 g 5   Coenzyme Q10 (CO Q 10 PO) Take 300 mg by mouth daily.     docusate sodium (COLACE) 100 MG capsule Take 100 mg by mouth daily as needed for mild constipation.     famotidine (PEPCID) 40 MG tablet Take 40 mg by mouth daily.     MAGNESIUM GLYCINATE PO Take 240 mg by mouth in the morning and at bedtime.      metoprolol  tartrate (LOPRESSOR ) 25 MG tablet Take one tablet two hours prior to the test 1 tablet 0   Multiple Vitamin (MULTIVITAMIN) tablet Take 1 tablet by mouth daily.     naproxen  (NAPROSYN ) 500 MG tablet Take 1 tablet (500 mg total) by mouth 2 (two) times daily as needed for moderate pain. With a meal 60 tablet 3   olmesartan  (BENICAR ) 40 MG tablet Take 1 tablet (40 mg total) by mouth daily. 90 tablet 3   pantoprazole  (PROTONIX ) 40 MG tablet Take 1 tablet (40 mg total) by mouth 2 (two) times daily before a meal. Breakfast and supper 60 tablet 11   rosuvastatin  (CRESTOR ) 10 MG tablet Take 1 tablet (10 mg total) by mouth daily. 90 tablet 3   Semaglutide , 2 MG/DOSE, (OZEMPIC , 2 MG/DOSE,) 8 MG/3ML SOPN Inject 2 mg into the skin once a week. 3 mL 0   No current facility-administered medications for this visit.    Allergies:   Patient has no known allergies.    Social History:  The patient  reports that she has never smoked. She has never used smokeless tobacco. She reports that she does not currently use alcohol. She reports that she does not use drugs.   Family History:  The patient's family history includes Anxiety disorder in her mother; Cancer in her father; Diabetes in her mother; Emphysema in her paternal grandfather; Heart attack (age of onset: 77 - 32) in her maternal grandfather; Heart disease in her mother; Hyperlipidemia in her mother; Hypertension in her father and mother; Obesity in her father and mother; Parkinson's disease in her paternal grandfather; Prostate cancer (age of onset: 58 - 38) in her father; Pulmonary fibrosis (age of onset: 64) in her mother; Pulmonary fibrosis (age of onset: 67) in her father; Sleep apnea in her mother; Thyroid  disease in her mother.    ROS:  Please see the history of present illness.   Otherwise, review of systems are positive for none.   All other systems are reviewed and negative.    PHYSICAL EXAM: VS:  BP 112/74 (BP Location: Right Arm, Cuff  Size: Large)   Pulse 61   Ht 5' 5 (1.651 m)   Wt 191 lb 4 oz (86.8 kg)   LMP 10/08/2018 (Exact Date)   SpO2 98%   BMI 31.83 kg/m  , BMI Body mass index is 31.83 kg/m. GEN: Well nourished, well developed, in no acute distress  HEENT: normal  Neck: no JVD, carotid bruits, or masses Cardiac: RRR; no murmurs, rubs, or gallops,no edema  Respiratory:  clear to auscultation bilaterally, normal  work of breathing GI: soft, nontender, nondistended, + BS MS: no deformity or atrophy  Skin: warm and dry, no rash Neuro:  Strength and sensation are intact Psych: euthymic mood, full affect   EKG:  EKG is ordered today. The ekg ordered today demonstrates : Normal sinus rhythm Left axis deviation Left bundle branch block When compared with ECG of 18-Aug-2021 09:57, No significant change was found      Recent Labs: 07/08/2023: Magnesium 2.1 12/02/2023: ALT 28; BUN 14; Creatinine, Ser 0.55; Hemoglobin 12.5; Platelets 274; Potassium 4.2; Sodium 142; TSH 1.100    Lipid Panel    Component Value Date/Time   CHOL 138 12/02/2023 1040   TRIG 52 12/02/2023 1040   HDL 45 12/02/2023 1040   CHOLHDL 4 03/05/2023 0824   VLDL 18.4 03/05/2023 0824   LDLCALC 82 12/02/2023 1040   LDLDIRECT 144.2 09/21/2008 1008      Wt Readings from Last 3 Encounters:  02/03/24 191 lb 4 oz (86.8 kg)  01/25/24 188 lb (85.3 kg)  12/28/23 184 lb (83.5 kg)          02/02/2024   12:29 PM  PAD Screen  Previous PAD dx? No  Previous surgical procedure? No  Pain with walking? Yes  Subsides with rest? Yes  Feet/toe relief with dangling? No  Painful, non-healing ulcers? No  Extremities discolored? No      ASSESSMENT AND PLAN:  1.  Left bundle branch block: This is likely causing some dyssynchrony and borderline cardiomyopathy based on her echocardiogram.  No history of ischemic heart disease.  Current symptoms include exertional dyspnea and fatigue and she is also diabetic.  We have to exclude obstructive  coronary artery disease as a culprit for this.  I recommend evaluation with cardiac CTA. We have to monitor her clinically and see if she develops any cardiomyopathy in the future related to left bundle branch block.  Currently no symptoms of heart failure.  2.  Essential hypertension: Blood pressure is well-controlled on current medications.  3.  Hyperlipidemia: Mildly elevated calcium  score but below 100.  Continue rosuvastatin  10 mg daily.  Most recent LDL was 82.  If cardiac CTA shows obstructive disease, we will intensify her statin treatment but she does report having myalgia with starting the medication.    Disposition:   FU with me in 6 months  Signed,  Deatrice Cage, MD  02/03/2024 11:19 AM    [facility]

## 2024-02-08 LAB — BASIC METABOLIC PANEL WITH GFR
BUN/Creatinine Ratio: 31 — ABNORMAL HIGH (ref 12–28)
BUN: 20 mg/dL (ref 8–27)
CO2: 22 mmol/L (ref 20–29)
Calcium: 9.3 mg/dL (ref 8.7–10.3)
Chloride: 108 mmol/L — ABNORMAL HIGH (ref 96–106)
Creatinine, Ser: 0.65 mg/dL (ref 0.57–1.00)
Glucose: 88 mg/dL (ref 70–99)
Potassium: 4.8 mmol/L (ref 3.5–5.2)
Sodium: 147 mmol/L — ABNORMAL HIGH (ref 134–144)
eGFR: 99 mL/min/1.73 (ref >59)

## 2024-02-09 ENCOUNTER — Ambulatory Visit: Payer: Self-pay | Admitting: Cardiovascular Disease

## 2024-02-20 ENCOUNTER — Encounter: Payer: Self-pay | Admitting: Family Medicine

## 2024-02-22 ENCOUNTER — Ambulatory Visit (INDEPENDENT_AMBULATORY_CARE_PROVIDER_SITE_OTHER): Admitting: Family Medicine

## 2024-02-22 ENCOUNTER — Encounter (INDEPENDENT_AMBULATORY_CARE_PROVIDER_SITE_OTHER): Payer: Self-pay | Admitting: Family Medicine

## 2024-02-22 VITALS — BP 125/80 | HR 60 | Temp 98.0°F | Ht 65.0 in | Wt 186.0 lb

## 2024-02-22 DIAGNOSIS — E669 Obesity, unspecified: Secondary | ICD-10-CM

## 2024-02-22 DIAGNOSIS — E1169 Type 2 diabetes mellitus with other specified complication: Secondary | ICD-10-CM | POA: Diagnosis not present

## 2024-02-22 DIAGNOSIS — Z6830 Body mass index (BMI) 30.0-30.9, adult: Secondary | ICD-10-CM

## 2024-02-22 DIAGNOSIS — R0602 Shortness of breath: Secondary | ICD-10-CM

## 2024-02-22 DIAGNOSIS — E66811 Obesity, class 1: Secondary | ICD-10-CM

## 2024-02-22 DIAGNOSIS — Z7985 Long-term (current) use of injectable non-insulin antidiabetic drugs: Secondary | ICD-10-CM | POA: Diagnosis not present

## 2024-02-22 NOTE — Progress Notes (Signed)
 [name], D.O.  ABFM, ABOM Specializing in Clinical Bariatric Medicine  Office located at: [address] [place], [place]  [address]      A) FOR THE CHRONIC DISEASE OF OBESITY:  Chief complaint: Obesity Patty Williams is here to discuss her progress with her obesity treatment plan.   History of present illness / Interval history:  [name] is here today for her follow-up office visit.  Since last OV on 01/25/24, pt is down 2 lbs. Pt states that she is doing good but is feeling like she is plateauing. She endorses walking 13k steps a day.     01/25/24 15:00 02/22/24 08:00   Body Fat % 41.7 % 40.6 %  Muscle Mass (lbs) 104 lbs 105.2 lbs  Fat Mass (lbs) 78.4 lbs 75.6 lbs  Total Body Water (lbs) 79 lbs 75 lbs  Visceral Fat Rating  11 11   Counseling done on how various foods will affect these numbers and how to maximize success   - Body fat % under 35% is goal   Total lbs lost to date: - 43 lbs Total Fat Mass in lbs lost to date: - 30.6 lbs Total weight loss percentage to date: -18.78 %    Obesity, Beginning BMI 38.11 Obesity (BMI 30.0-34.9) -current BMI 30.95  Nutrition Therapy She is on the Category 2 Plan and states she is following her eating plan approximately 60 % of the time.   - Tracking Calories/Macros: no   - Eating More Whole Foods: no   - Adequate Protein Intake: yes  - Adequate Water Intake: yes  - Skipping Meals: no   - Sleeping 7-9 Hours/ Night: yes   Kenslie is currently in the action stage of change. As such, her goal is to continue weight management plan.  She has agreed to: switch to 1000 calories with 75+ g of protein with Cat 1 as guide    Physical Activity Cristal is walking or strength training 20  minutes 3 to 4 days per week   Elaf has been advised to work up to 300-450 minutes of moderate intensity aerobic activity a week and strengthening exercises 2-3 times per week for cardiovascular health, weight loss maintenance and  preservation of muscle mass.  She has agreed to : Increase physical activity in their day and reduce sedentary time (increase NEAT)., Increase volume of physical activity to a goal of 240 minutes a week, and Combine aerobic and strengthening exercises for efficiency and improved cardiometabolic health.   Behavioral Modifications Evidence-based interventions for health behavior change were utilized today including the discussion of  1) self monitoring techniques:    - Eat every 3 to 4 hours  2) problem-solving barriers:    - Meal prep   3) self care:    4) SMART goals for next OV:    - Have a set afternoon protein snack  Regarding patient's less desirable eating habits and patterns, we employed the technique of small changes.   We discussed the following today: increasing lean protein intake to established goals, continue to work on implementation of reduced calorie nutritional plan, focusing on food with a 10:1 ratio of calories: grams of protein, and discussed pre-packaged healthier meals such as [employer] All Natural, Whole 30 chicken meals, Longlife meal prep and Factor Meals etc Additional resources provided today: Handout on the concepts of Adaptive Thermogenesis and Handout on CAT 1 meal plan    Medical Interventions/ Pharmacotherapy Previous Bariatric surgery: n/a Pharmacotherapy for weight loss:  She is currently taking Ozempic  2 mg weekly for medical weight loss.    We discussed various medication options to help [facility] with her weight loss efforts and we both agreed to : Continue with current nutritional and behavioral strategies   B) OBESITY RELATED CONDITIONS ADDRESSED TODAY:  SOB (shortness of breath) Assessment & Plan she does feel that she gets out of breath more easily than she used to when she exercises and feels that this would improve with weight loss.  Denies shortness of breath at rest or orthopnea. A repeat IC was completed today to help guide our dietary regimen.  It shows a VO2 of 175 and a REE of 1210.  Her calculated basal metabolic rate is 8479 thus her resting energy expenditure is worse than expected.  Reviewed aspects of diet and lifestyle today with patient that will increase metabolism.  All questions addressed.  Continue to focus on lean proteins, adequate nutrition and advancing cardiovascular and resistance training as tolerated.  Pt declines over all SOB with normal exercise but with max exertion of 85% or more of maximum heart rate she will feel SOB. Exercise with target HR is 80% and would be 125 bpm. Her goal BPM is 110.     Type 2 diabetes mellitus with other specified complication, unspecified whether long term insulin  use [facility]) Assessment & Plan Lab Results  Component Value Date   HGBA1C 5.9 (H) 12/02/2023   HGBA1C 5.9 (H) 07/08/2023   HGBA1C 5.9 03/05/2023   INSULIN  13.8 12/02/2023   INSULIN  27.3 (H) 07/08/2023   INSULIN  10.3 07/02/2022    Pt is taking Ozempic  2 mg weekly. With good compliance and tolerance. PT states that in the afternoon she has a little bit of cravings and loses focus. Had an extensive discussion on how pt can change her metabolism with increasing her protein intake and what to eat throughout the day. Encouraged pt to have a set afternoon protein snack to help curve her cravings. Pt cannot take Mounjaro  due to GI upsets and side effects when she was previously on it. Explained to pt how there are several other medications she could consider in the future to help. Pt will work on getting her protein in and having a set protein snack. Continue follow prudent meal plan and decreasing simple carbs.       Follow up:   Return 03/21/2024 at 2:00 PM  She was informed of the importance of frequent follow up visits to maximize her success with intensive lifestyle modifications for her multiple health conditions.   Weight Summary and Biometrics   Weight Lost Since Last Visit: 2lb  Weight Gained Since Last Visit:  0lb    Vitals Temp: 98 F (36.7 C) BP: 125/80 Pulse Rate: 60 SpO2: 99 %   Anthropometric Measurements Height: 5' 5 (1.651 m) Weight: 186 lb (84.4 kg) BMI (Calculated): 30.95 Weight at Last Visit: 188lb Weight Lost Since Last Visit: 2lb Weight Gained Since Last Visit: 0lb Starting Weight: 229lb Total Weight Loss (lbs): 43 lb (19.5 kg)   Body Composition  Body Fat %: 40.6 % Fat Mass (lbs): 75.6 lbs Muscle Mass (lbs): 105.2 lbs Total Body Water (lbs): 75 lbs Visceral Fat Rating : 11   Other Clinical Data RMR: 1210 Fasting: Yes Labs: No Today's Visit #: 46 Starting Date: 06/27/20    Objective:   PHYSICAL EXAM: Blood pressure 125/80, pulse 60, temperature 98 F (36.7 C), height 5' 5 (1.651 m), weight 186 lb (84.4 kg), last menstrual period  10/08/2018, SpO2 99%. Body mass index is 30.95 kg/m.  General: she is overweight, cooperative and in no acute distress. PSYCH: Has normal mood, affect and thought process.   HEENT: EOMI, sclerae are anicteric. Lungs: Normal breathing effort, no conversational dyspnea. Extremities: Moves * 4 Neurologic: A and O * 3, good insight  DIAGNOSTIC DATA REVIEWED: BMET    Component Value Date/Time   NA 147 (H) 02/03/2024 0926   K 4.8 02/03/2024 0926   CL 108 (H) 02/03/2024 0926   CO2 22 02/03/2024 0926   GLUCOSE 88 02/03/2024 0926   GLUCOSE 98 03/05/2023 0824   BUN 20 02/03/2024 0926   CREATININE 0.65 02/03/2024 0926   CALCIUM  9.3 02/03/2024 0926   GFRNONAA 98 06/27/2020 0840   GFRAA 113 06/27/2020 0840   Lab Results  Component Value Date   HGBA1C 5.9 (H) 12/02/2023   HGBA1C 6.5 (H) 03/02/2008   Lab Results  Component Value Date   INSULIN  13.8 12/02/2023   INSULIN  19.9 06/27/2020   Lab Results  Component Value Date   TSH 1.100 12/02/2023   CBC    Component Value Date/Time   WBC 5.5 12/02/2023 1040   WBC 6.3 03/05/2023 0824   RBC 4.14 12/02/2023 1040   RBC 4.33 03/05/2023 0824   HGB 12.5 12/02/2023 1040    HCT 38.1 12/02/2023 1040   PLT 274 12/02/2023 1040   MCV 92 12/02/2023 1040   MCH 30.2 12/02/2023 1040   MCH 28.9 09/23/2018 0404   MCHC 32.8 12/02/2023 1040   MCHC 32.1 03/05/2023 0824   RDW 13.3 12/02/2023 1040   Iron Studies    Component Value Date/Time   IRON 92 07/08/2023 1006   TIBC 382 07/08/2023 1006   FERRITIN 40 07/08/2023 1006   IRONPCTSAT 24 07/08/2023 1006   Lipid Panel     Component Value Date/Time   CHOL 138 12/02/2023 1040   TRIG 52 12/02/2023 1040   HDL 45 12/02/2023 1040   CHOLHDL 4 03/05/2023 0824   VLDL 18.4 03/05/2023 0824   LDLCALC 82 12/02/2023 1040   LDLDIRECT 144.2 09/21/2008 1008   Hepatic Function Panel     Component Value Date/Time   PROT 6.6 12/02/2023 1040   ALBUMIN 4.2 12/02/2023 1040   AST 27 12/02/2023 1040   ALT 28 12/02/2023 1040   ALKPHOS 78 12/02/2023 1040   BILITOT 0.4 12/02/2023 1040   BILIDIR 0.1 03/02/2008 1018      Component Value Date/Time   TSH 1.100 12/02/2023 1040   Nutritional Lab Results  Component Value Date   VD25OH 108.0 (H) 12/02/2023   VD25OH 67.2 07/08/2023   VD25OH 34.66 03/05/2023    Attestations:   LILLETTE Sonny Laroche, acting as a Stage manager for Barnie Jenkins, DO., have compiled all relevant documentation for today's office visit on behalf of Barnie Jenkins, DO, while in the presence of [employer], DO.  I have spent 41 minutes in the care of the patient today including 33 minutes face-to-face assessing and reviewing listed medical problems above as outlined in office visit note and providing nutritional and behavioral counseling as outlined in obesity care plan.   I have reviewed the above documentation for accuracy and completeness, and I agree with the above. Barnie JINNY Jenkins, D.O.  The 21st Century Cures Act was signed into law in 2016 which includes the topic of electronic health records.  This provides immediate access to information in MyChart.  This includes consultation notes,  operative notes, office notes, lab results and pathology  reports.  If you have any questions about what you read please let us  know at your next visit so we can discuss your concerns and take corrective action if need be.  We are right here with you.

## 2024-02-24 ENCOUNTER — Encounter (HOSPITAL_COMMUNITY): Payer: Self-pay

## 2024-02-25 ENCOUNTER — Telehealth (HOSPITAL_COMMUNITY): Payer: Self-pay | Admitting: Emergency Medicine

## 2024-02-25 NOTE — Telephone Encounter (Signed)
 Reaching out to patient to offer assistance regarding upcoming cardiac imaging study; pt verbalizes understanding of appt date/time, parking situation and where to check in, pre-test NPO status and medications ordered, and verified current allergies; name and call back number provided for further questions should they arise Rockwell Alexandria RN Navigator Cardiac Imaging [facility] [phone] office [phone] cell

## 2024-02-28 ENCOUNTER — Encounter: Payer: Self-pay | Admitting: Family Medicine

## 2024-02-28 ENCOUNTER — Ambulatory Visit
Admission: RE | Admit: 2024-02-28 | Discharge: 2024-02-28 | Disposition: A | Discharge status: Home / Self Care | Source: Ambulatory Visit | Attending: Cardiovascular Disease | Admitting: Cardiovascular Disease

## 2024-02-28 DIAGNOSIS — R072 Precordial pain: Secondary | ICD-10-CM | POA: Diagnosis not present

## 2024-02-28 MED ORDER — IOHEXOL 350 MG/ML SOLN
100.0000 mL | Freq: Once | INTRAVENOUS | Status: AC | PRN
  Administered 2024-02-28: 100 mL via INTRAVENOUS

## 2024-02-28 MED ORDER — NITROGLYCERIN 0.4 MG SL SUBL
0.8000 mg | SUBLINGUAL_TABLET | Freq: Once | SUBLINGUAL | Status: AC
Start: 2024-02-28 — End: 2024-02-28
  Administered 2024-02-28: 0.8 mg via SUBLINGUAL
  Filled 2024-02-28: qty 25

## 2024-02-29 ENCOUNTER — Telehealth: Payer: Self-pay | Admitting: Family Medicine

## 2024-02-29 DIAGNOSIS — E1169 Type 2 diabetes mellitus with other specified complication: Secondary | ICD-10-CM

## 2024-02-29 DIAGNOSIS — E7849 Other hyperlipidemia: Secondary | ICD-10-CM

## 2024-02-29 DIAGNOSIS — I10 Essential (primary) hypertension: Secondary | ICD-10-CM

## 2024-02-29 DIAGNOSIS — Z7985 Long-term (current) use of injectable non-insulin antidiabetic drugs: Secondary | ICD-10-CM

## 2024-02-29 DIAGNOSIS — Z8639 Personal history of other endocrine, nutritional and metabolic disease: Secondary | ICD-10-CM | POA: Insufficient documentation

## 2024-02-29 DIAGNOSIS — E673 Hypervitaminosis D: Secondary | ICD-10-CM | POA: Insufficient documentation

## 2024-02-29 DIAGNOSIS — E559 Vitamin D deficiency, unspecified: Secondary | ICD-10-CM

## 2024-02-29 DIAGNOSIS — Z79899 Other long term (current) drug therapy: Secondary | ICD-10-CM

## 2024-02-29 NOTE — Telephone Encounter (Signed)
-----   Message from Veva JINNY Ferrari sent at 02/14/2024  2:49 PM EDT ----- Regarding: Lab orders for [facility], 10.23.25 Patient is scheduled for CPX labs, please order future labs, Thanks , Veva

## 2024-03-02 ENCOUNTER — Ambulatory Visit: Payer: Self-pay | Admitting: Family Medicine

## 2024-03-02 ENCOUNTER — Other Ambulatory Visit: Payer: BC Managed Care – PPO

## 2024-03-02 ENCOUNTER — Encounter: Payer: Self-pay | Admitting: Cardiovascular Disease

## 2024-03-02 DIAGNOSIS — E7849 Other hyperlipidemia: Secondary | ICD-10-CM

## 2024-03-02 DIAGNOSIS — Z8639 Personal history of other endocrine, nutritional and metabolic disease: Secondary | ICD-10-CM

## 2024-03-02 DIAGNOSIS — Z7985 Long-term (current) use of injectable non-insulin antidiabetic drugs: Secondary | ICD-10-CM

## 2024-03-02 DIAGNOSIS — E559 Vitamin D deficiency, unspecified: Secondary | ICD-10-CM

## 2024-03-02 DIAGNOSIS — E785 Hyperlipidemia, unspecified: Secondary | ICD-10-CM

## 2024-03-02 DIAGNOSIS — E1169 Type 2 diabetes mellitus with other specified complication: Secondary | ICD-10-CM | POA: Diagnosis not present

## 2024-03-02 DIAGNOSIS — E119 Type 2 diabetes mellitus without complications: Secondary | ICD-10-CM | POA: Diagnosis not present

## 2024-03-02 DIAGNOSIS — E673 Hypervitaminosis D: Secondary | ICD-10-CM | POA: Diagnosis not present

## 2024-03-02 DIAGNOSIS — I10 Essential (primary) hypertension: Secondary | ICD-10-CM | POA: Diagnosis not present

## 2024-03-02 LAB — CBC WITH DIFFERENTIAL/PLATELET
Basophils Absolute: 0 K/uL (ref 0.0–0.1)
Basophils Relative: 0.5 % (ref 0.0–3.0)
Eosinophils Absolute: 0.1 K/uL (ref 0.0–0.7)
Eosinophils Relative: 1.5 % (ref 0.0–5.0)
HCT: 38.9 % (ref 36.0–46.0)
Hemoglobin: 12.6 g/dL (ref 12.0–15.0)
Lymphocytes Relative: 39.5 % (ref 12.0–46.0)
Lymphs Abs: 2.1 K/uL (ref 0.7–4.0)
MCHC: 32.4 g/dL (ref 30.0–36.0)
MCV: 90.4 fl (ref 78.0–100.0)
Monocytes Absolute: 0.6 K/uL (ref 0.1–1.0)
Monocytes Relative: 10.9 % (ref 3.0–12.0)
Neutro Abs: 2.5 K/uL (ref 1.4–7.7)
Neutrophils Relative %: 47.6 % (ref 43.0–77.0)
Platelets: 244 K/uL (ref 150.0–400.0)
RBC: 4.3 Mil/uL (ref 3.87–5.11)
RDW: 14.3 % (ref 11.5–15.5)
WBC: 5.3 K/uL (ref 4.0–10.5)

## 2024-03-02 LAB — COMPREHENSIVE METABOLIC PANEL WITH GFR
ALT: 26 U/L (ref 0–35)
AST: 22 U/L (ref 0–37)
Albumin: 4.1 g/dL (ref 3.5–5.2)
Alkaline Phosphatase: 62 U/L (ref 39–117)
BUN: 17 mg/dL (ref 6–23)
CO2: 31 meq/L (ref 19–32)
Calcium: 8.8 mg/dL (ref 8.4–10.5)
Chloride: 104 meq/L (ref 96–112)
Creatinine, Ser: 0.58 mg/dL (ref 0.40–1.20)
GFR: 95.96 mL/min (ref >60.00)
Glucose, Bld: 95 mg/dL (ref 70–99)
Potassium: 4.2 meq/L (ref 3.5–5.1)
Sodium: 141 meq/L (ref 135–145)
Total Bilirubin: 0.5 mg/dL (ref 0.2–1.2)
Total Protein: 6.6 g/dL (ref 6.0–8.3)

## 2024-03-02 LAB — LIPID PANEL
Cholesterol: 144 mg/dL (ref 0–200)
HDL: 48.8 mg/dL (ref >39.00)
LDL Cholesterol: 83 mg/dL (ref 0–99)
NonHDL: 95.37
Total CHOL/HDL Ratio: 3
Triglycerides: 63 mg/dL (ref 0.0–149.0)
VLDL: 12.6 mg/dL (ref 0.0–40.0)

## 2024-03-02 LAB — VITAMIN D 25 HYDROXY (VIT D DEFICIENCY, FRACTURES): VITD: 38.12 ng/mL (ref 30.00–100.00)

## 2024-03-02 LAB — TSH: TSH: 1.87 u[IU]/mL (ref 0.35–5.50)

## 2024-03-02 LAB — FERRITIN: Ferritin: 24.4 ng/mL (ref 10.0–291.0)

## 2024-03-02 LAB — HEMOGLOBIN A1C: Hgb A1c MFr Bld: 5.9 % (ref 4.6–6.5)

## 2024-03-09 ENCOUNTER — Encounter: Payer: Self-pay | Admitting: Family Medicine

## 2024-03-09 ENCOUNTER — Ambulatory Visit (INDEPENDENT_AMBULATORY_CARE_PROVIDER_SITE_OTHER): Payer: BC Managed Care – PPO | Admitting: Family Medicine

## 2024-03-09 VITALS — BP 118/72 | HR 60 | Temp 98.0°F | Ht 64.75 in | Wt 191.0 lb

## 2024-03-09 DIAGNOSIS — E1169 Type 2 diabetes mellitus with other specified complication: Secondary | ICD-10-CM | POA: Diagnosis not present

## 2024-03-09 DIAGNOSIS — Z0000 Encounter for general adult medical examination without abnormal findings: Secondary | ICD-10-CM | POA: Diagnosis not present

## 2024-03-09 DIAGNOSIS — G4733 Obstructive sleep apnea (adult) (pediatric): Secondary | ICD-10-CM

## 2024-03-09 DIAGNOSIS — J84112 Idiopathic pulmonary fibrosis: Secondary | ICD-10-CM

## 2024-03-09 DIAGNOSIS — I10 Essential (primary) hypertension: Secondary | ICD-10-CM

## 2024-03-09 DIAGNOSIS — Z7985 Long-term (current) use of injectable non-insulin antidiabetic drugs: Secondary | ICD-10-CM

## 2024-03-09 DIAGNOSIS — Z79899 Other long term (current) drug therapy: Secondary | ICD-10-CM

## 2024-03-09 DIAGNOSIS — E119 Type 2 diabetes mellitus without complications: Secondary | ICD-10-CM

## 2024-03-09 DIAGNOSIS — Z6830 Body mass index (BMI) 30.0-30.9, adult: Secondary | ICD-10-CM

## 2024-03-09 DIAGNOSIS — E785 Hyperlipidemia, unspecified: Secondary | ICD-10-CM

## 2024-03-09 DIAGNOSIS — I5189 Other ill-defined heart diseases: Secondary | ICD-10-CM

## 2024-03-09 DIAGNOSIS — J4520 Mild intermittent asthma, uncomplicated: Secondary | ICD-10-CM

## 2024-03-09 DIAGNOSIS — E559 Vitamin D deficiency, unspecified: Secondary | ICD-10-CM

## 2024-03-09 NOTE — Assessment & Plan Note (Signed)
 Stable Symbicort  80-4.5 mg

## 2024-03-09 NOTE — Assessment & Plan Note (Signed)
 Lab Results  Component Value Date   HGBA1C 5.9 03/02/2024   HGBA1C 5.9 (H) 12/02/2023   HGBA1C 5.9 (H) 07/08/2023   Semaglutide  2 mg weekly  Interested in cgm-will see what brand is covered and call Goal LDL 70 or below Working on weight loss

## 2024-03-09 NOTE — Assessment & Plan Note (Signed)
 Disc goals for lipids and reasons to control them Rev last labs with pt Rev low sat fat diet in detail  Tolerates crestor  10 -apprehensive to increase due to muscle pain  LDL 83 Goal below 70 Reviewed Cardiac ca score   Pt wants to work on diet Re check 3 mo  If not at goal consider zetia-handout given

## 2024-03-09 NOTE — Assessment & Plan Note (Signed)
 Dr Neysa is retiring Pt plans to get set up with pulm in [place] if possible for management Losing weight and doing well with cpap

## 2024-03-09 NOTE — Assessment & Plan Note (Signed)
 Reviewed health habits including diet and exercise and skin cancer prevention Reviewed appropriate screening tests for age  Also reviewed health mt list, fam hx and immunization status , as well as social and family history   See HPI Labs reviewed and ordered Health Maintenance  Topic Date Due   HIV Screening  Never done   Hepatitis C Screening  Never done   Eye exam for diabetics  11/08/2009   Pneumococcal Vaccine for age over 8 (2 of 2 - PCV) 12/18/2018   Complete foot exam   03/04/2023   Breast Cancer Screening  04/19/2024   COVID-19 Vaccine (9 - 2025-26 season) 04/13/2024   Hemoglobin A1C  08/31/2024   Pap with HPV screening  09/08/2024   Yearly kidney health urinalysis for diabetes  12/01/2024   Yearly kidney function blood test for diabetes  03/02/2025   DTaP/Tdap/Td vaccine (4 - Td or Tdap) 07/20/2028   Colon Cancer Screening  03/09/2029   Flu Shot  Completed   Zoster (Shingles) Vaccine  Completed   Hepatitis B Vaccine  Aged Out   HPV Vaccine  Aged Out   Meningitis B Vaccine  Aged Out    Sent for oph report  Mammogram scheduled -due in dec [facility]  Discussed fall prevention, supplements and exercise for bone density  PHQ 0

## 2024-03-09 NOTE — Patient Instructions (Addendum)
 Add strength training more days per week   Keep working on diet/exercise for cholesterol Goal LDL is below 70   Let's re check in 3 months  If not at goal, consider adding generic zetia (see handout)   Keep up the good work!  Find out what brand of CGM is covered/ let me know and I will send it in

## 2024-03-09 NOTE — Assessment & Plan Note (Signed)
 Discussed how this problem influences overall health and the risks it imposes  Reviewed plan for weight loss with lower calorie diet (via better food choices (lower glycemic and portion control) along with exercise building up to or more than 30 minutes 5 days per week including some aerobic activity and strength training   Continues to go to the healthy weight clinic  Also semaglutide 

## 2024-03-09 NOTE — Assessment & Plan Note (Signed)
 Continues pulmonary care Overall stable and doing well

## 2024-03-09 NOTE — Assessment & Plan Note (Signed)
 Last vitamin D  Lab Results  Component Value Date   VD25OH 38.12 03/02/2024   Getting back on vit D oral

## 2024-03-09 NOTE — Progress Notes (Signed)
 Subjective:    Patient ID: Patty Williams, female    DOB: 18-Sep-1960, 63 y.o.   MRN: [id number]  HPI  Here for health maintenance exam and to review chronic medical problems   Wt Readings from Last 3 Encounters:  03/09/24 191 lb (86.6 kg)  02/22/24 186 lb (84.4 kg)  02/03/24 191 lb 4 oz (86.8 kg)   32.03 kg/m  Vitals:   03/09/24 1006 03/09/24 1040  BP: 128/82 118/72  Pulse: 60   Temp: 98 F (36.7 C)   SpO2: 100%     Immunization History  Administered Date(s) Administered   Influenza Split 01/10/2012, 01/23/2013   Influenza Whole 03/02/2008, 02/21/2010   Influenza,inj,Quad PF,6+ Mos 01/13/2019, 01/19/2020   Influenza,inj,Quad PF,6-35 Mos 02/10/2024   Influenza,trivalent, recombinat, inj, PF 02/04/2023   Influenza-Unspecified 01/23/2014, 01/24/2015, 01/09/2021, 01/30/2021, 01/30/2022   Moderna Covid-19 Fall Seasonal Vaccine 29yrs & older 03/10/2022, 02/04/2023   Moderna Sars-Covid-2 Vaccination 05/11/2019, 06/09/2019, 03/08/2020, 08/26/2020, 02/03/2021, 02/17/2024   Pneumococcal Polysaccharide-23 03/02/2008, 05/11/2011, 12/17/2017   Td 06/11/1997   Tdap 05/11/2011, 07/21/2018   Zoster Recombinant(Shingrix) 01/03/2018, 03/30/2018    Health Maintenance Due  Topic Date Due   HIV Screening  Never done   Hepatitis C Screening  Never done   OPHTHALMOLOGY EXAM  11/08/2009   Pneumococcal Vaccine: 50+ Years (2 of 2 - PCV) 12/18/2018   FOOT EXAM  03/04/2023   Mammogram  04/19/2024   Doing ok overall Has adapted to her chronic back issues  Working on this    Eye exam utd-sent for report  Next one is scheduled   Mammogram 04/2023 - has it scheduled  Self breast exam- no lumps   Gyn health Sees gyn  Pap 2023    Colon cancer screening  Colonoscopy 02/2019 - 3 more years   Bone health  Dexa 2017 normal  Falls-none  Fractures-none  Supplements -started back on one vitamin D  daily  Last vitamin D  Lab Results  Component Value Date   VD25OH 38.12 03/02/2024     Exercise  4 days per week  20 or more minutes - gym or home  Pilates once weekly  Needs more strength training   Good diet More protein  [facility]y weight clinic  Did gain some muscle    Mood    03/09/2024   10:09 AM 11/18/2023    9:55 AM 08/16/2023    8:58 AM 07/23/2023    9:05 AM 03/09/2023    3:16 PM  Depression screen PHQ 2/9  Decreased Interest 0 0 0 0 0  Down, Depressed, Hopeless 0 0 0 0 0  PHQ - 2 Score 0 0 0 0 0  Altered sleeping 0  0 0 1  Tired, decreased energy 0  0 0 1  Change in appetite 0  0 0 0  Feeling bad or failure about yourself  0  0 0 0  Trouble concentrating 0  0 0 0  Moving slowly or fidgety/restless 0  0 0 0  Suicidal thoughts 0  0 0 0  PHQ-9 Score 0  0 0 2  Difficult doing work/chores Not difficult at all  Not difficult at all Not difficult at all Not difficult at all   HTN bp is stable today  No cp or palpitations or headaches or edema  No side effects to medicines  BP Readings from Last 3 Encounters:  03/09/24 118/72  02/28/24 108/68  02/22/24 125/80     Lab Results  Component Value Date   NA 141  03/02/2024   K 4.2 03/02/2024   CO2 31 03/02/2024   GLUCOSE 95 03/02/2024   BUN 17 03/02/2024   CREATININE 0.58 03/02/2024   CALCIUM  8.8 03/02/2024   GFR 95.96 03/02/2024   EGFR 99 02/03/2024   GFRNONAA 98 06/27/2020   Olmesartan  20 mg daily  GERD Protonix  40 mg  Lab Results  Component Value Date   VITAMINB12 1,449 (H) 12/02/2023  Pepcid  Mvi    Has to take while on  glp-1 which worsens reflux   DM2 Lab Results  Component Value Date   HGBA1C 5.9 03/02/2024   HGBA1C 5.9 (H) 12/02/2023   HGBA1C 5.9 (H) 07/08/2023   Lab Results  Component Value Date   LABMICR <3.0 12/02/2023   MICROALBUR 1.0 08/16/2009   MICROALBUR 0.6 09/21/2008   Semaglutide  2 mg weekly From healthy weight clinic   Hyperlipidemia Lab Results  Component Value Date   CHOL 144 03/02/2024   CHOL 138 12/02/2023   CHOL 166 07/08/2023   Lab Results   Component Value Date   HDL 48.80 03/02/2024   HDL 45 12/02/2023   HDL 64 07/08/2023   Lab Results  Component Value Date   LDLCALC 83 03/02/2024   LDLCALC 82 12/02/2023   LDLCALC 92 07/08/2023   Lab Results  Component Value Date   TRIG 63.0 03/02/2024   TRIG 52 12/02/2023   TRIG 50 07/08/2023   Lab Results  Component Value Date   CHOLHDL 3 03/02/2024   CHOLHDL 4 03/05/2023   CHOLHDL 3 04/14/2022   Lab Results  Component Value Date   LDLDIRECT 144.2 09/21/2008   Crestor  10 mg daily -tolerating well so far  Was previously on 5    Coronary ca score 29.1 , 71%ile  Minimal stenosis  RCA, mid LAD  The 10-year ASCVD risk score (Arnett DK, et al., 2019) is: 8.5%   Values used to calculate the score:     Age: 53 years     Clincally relevant sex: Female     Is Non-Hispanic African American: No     Diabetic: Yes     Tobacco smoker: No     Systolic Blood Pressure: 118 mmHg     Is BP treated: Yes     HDL Cholesterol: 48.8 mg/dL     Total Cholesterol: 144 mg/dL  Also takes K2   Dr Darron -cardiology  [facility] [employer]      Lab Results  Component Value Date   WBC 5.3 03/02/2024   HGB 12.6 03/02/2024   HCT 38.9 03/02/2024   MCV 90.4 03/02/2024   PLT 244.0 03/02/2024   Lab Results  Component Value Date   IRON 92 07/08/2023   TIBC 382 07/08/2023   FERRITIN 24.4 03/02/2024    Lab Results  Component Value Date   TSH 1.87 03/02/2024     Patient Active Problem List   Diagnosis Date Noted   Hypervitaminosis D 02/29/2024   History of iron deficiency 02/29/2024   Reactive airway disease 11/29/2023   COVID-19 virus infection 11/18/2023   Cervical disc disease 08/22/2023   Trochanteric bursitis 08/22/2023   Gluteal tendonitis 08/22/2023   Diastolic dysfunction 08/16/2023   IPF (idiopathic pulmonary fibrosis) (HCC) 07/23/2023   Cardiomegaly 07/23/2023   Right calf pain 03/25/2023   Chronic back pain 03/16/2023   Immunity to hepatitis B virus demonstrated by  serologic test 02/25/2023   Muscle cramps 02/11/2023   Notalgia 08/11/2022   Hyperlipidemia associated with type 2 diabetes mellitus (HCC) 07/02/2022  Elevated ALT measurement 07/02/2022   Stress 06/16/2022   BMI 30.0-30.9,adult 06/16/2022   Obesity, Beginning BMI 38.11 06/16/2022   Type 2 diabetes mellitus with obesity 05/25/2022   Other hyperlipidemia 01/05/2022   Heat intolerance 12/15/2021   Type 2 diabetes mellitus with other specified complication (HCC) 09/30/2021   Lumbar disc disease 02/11/2021   Other fatigue 06/27/2020   Vitamin D  deficiency 06/27/2020   Intermittent diarrhea 03/15/2020   Hx of adenomatous polyp of colon 03/24/2019   Family history of pulmonary fibrosis 12/23/2018   Family history of kidney disease 12/23/2018   Colon cancer screening 12/23/2018   Current use of proton pump inhibitor 10/18/2015   History of fracture 10/18/2015   Left knee pain 04/19/2015   Frequent UTI 10/06/2013   Seasonal and perennial allergic rhinitis 01/01/2013   Routine general medical examination at a health care facility 04/30/2011   Diabetes mellitus treated with injections of non-insulin  medication (HCC)    Plantar fasciitis    METATARSALGIA 04/11/2010   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   Obstructive sleep apnea 06/10/2009   GERD 11/09/2008   Essential hypertension 03/02/2008   Class 1 obesity due to excess calories with serious comorbidity and body mass index (BMI) of 33.0 to 33.9 in adult 11/18/2007   Allergic asthma, mild intermittent, uncomplicated 03/29/2007   Endometriosis 03/29/2007   Past Medical History:  Diagnosis Date   Anxiety    Asthma    Back pain    Bilateral ovarian cysts    Bruxism    Constipation    Diabetes mellitus type II    mild   Diverticulosis 2014   Mild   Dry mouth    Elevated blood pressure reading without diagnosis of hypertension    Endometriosis    Severe   Fibroids    GERD (gastroesophageal reflux disease) 0   Not sure  when   History of ovarian cyst    HLD (hyperlipidemia)    Hx of adenomatous polyp of colon 03/24/2019   Hyperglycemia    Hyperlipidemia    Hypertension    Iron deficiency anemia    Joint pain    Menorrhagia    Obesity    Obesity    OSA on CPAP    Plantar fasciitis    with orthotics-much difficulty adjusting these   PMB (postmenopausal bleeding)    Pneumonia    history of   PONV (postoperative nausea and vomiting)    Pre-diabetes    Rectovaginal fistula    Sleep apnea    Uterine adhesion    Uterine fibroid    Wears glasses    Past Surgical History:  Procedure Laterality Date   COLONOSCOPY  01/2009   DILATATION & CURETTAGE/HYSTEROSCOPY WITH MYOSURE N/A 09/22/2018   Procedure: DILATATION & CURETTAGE/HYSTEROSCOPY;  Surgeon: Kandyce Sor, MD;  Location: [facility];  Service: Gynecology;  Laterality: N/A;   HEMORRHOID BANDING     HEMORRHOID SURGERY     LAPAROSCOPY     endometriosis   NASAL SEPTOPLASTY W/ TURBINOPLASTY Bilateral 09/02/2021   Procedure: NASAL SEPTOPLASTY WITH SUBMUCOSAL RESECTION OF TURBINATES;  Surgeon: Herminio Miu, MD;  Location: [facility];  Service: ENT;  Laterality: Bilateral;   SEPTOPLASTY     april 2023   TONSILLECTOMY AND ADENOIDECTOMY     TRIGGER FINGER RELEASE     UPPER GI ENDOSCOPY  01/2009   uterine biopsy     uterine cyst exploration     UTERINE SUSPENSION     WISDOM TOOTH EXTRACTION  Social History   Tobacco Use   Smoking status: Never   Smokeless tobacco: Never  Vaping Use   Vaping status: Never Used  Substance Use Topics   Alcohol use: Not Currently    Comment: occasionally   Drug use: No   Family History  Problem Relation Age of Onset   Thyroid  disease Mother        ?   Diabetes Mother    Hypertension Mother    Hyperlipidemia Mother    Anxiety disorder Mother    Sleep apnea Mother    Obesity Mother    Heart disease Mother    Pulmonary fibrosis Mother 4   Pulmonary fibrosis Father 38    Hypertension Father    Obesity Father    Prostate cancer Father 44 - 103   Cancer Father    Heart attack Maternal Grandfather 88 - 24   Parkinson's disease Paternal Grandfather    Emphysema Paternal Grandfather    Stomach cancer Neg Hx    Colon cancer Neg Hx    Esophageal cancer Neg Hx    No Known Allergies Current Outpatient Medications on File Prior to Visit  Medication Sig Dispense Refill   albuterol  (VENTOLIN  HFA) 108 (90 Base) MCG/ACT inhaler Inhale 2 puffs into the lungs every 4 (four) hours as needed. 8.5 g 5   AMBULATORY NON FORMULARY MEDICATION Single glucometer with lancets, test strips-Check FBS and 2 hour PP(BID) 1 each 0   budesonide -formoterol  (SYMBICORT ) 80-4.5 MCG/ACT inhaler USE 2 INHALATIONS TWICE A DAY 10.2 g 5   Coenzyme Q10 (CO Q 10 PO) Take 300 mg by mouth daily.     docusate sodium (COLACE) 100 MG capsule Take 100 mg by mouth daily as needed for mild constipation.     famotidine (PEPCID) 40 MG tablet Take 40 mg by mouth daily.     MAGNESIUM GLYCINATE PO Take 240 mg by mouth in the morning and at bedtime.     Multiple Vitamin (MULTIVITAMIN) tablet Take 1 tablet by mouth daily.     naproxen  (NAPROSYN ) 500 MG tablet Take 1 tablet (500 mg total) by mouth 2 (two) times daily as needed for moderate pain. With a meal 60 tablet 3   olmesartan  (BENICAR ) 40 MG tablet Take 1 tablet (40 mg total) by mouth daily. 90 tablet 3   pantoprazole  (PROTONIX ) 40 MG tablet Take 1 tablet (40 mg total) by mouth 2 (two) times daily before a meal. Breakfast and supper 60 tablet 11   rosuvastatin  (CRESTOR ) 10 MG tablet Take 1 tablet (10 mg total) by mouth daily. 90 tablet 3   Semaglutide , 2 MG/DOSE, (OZEMPIC , 2 MG/DOSE,) 8 MG/3ML SOPN Inject 2 mg into the skin once a week. 3 mL 0   No current facility-administered medications on file prior to visit.    Review of Systems  Constitutional:  Negative for activity change, appetite change, fatigue, fever and unexpected weight change.  HENT:   Negative for congestion, ear pain, rhinorrhea, sinus pressure and sore throat.   Eyes:  Negative for pain, redness and visual disturbance.  Respiratory:  Negative for cough, shortness of breath and wheezing.   Cardiovascular:  Negative for chest pain and palpitations.  Gastrointestinal:  Negative for abdominal pain, blood in stool, constipation and diarrhea.  Endocrine: Negative for polydipsia and polyuria.  Genitourinary:  Negative for dysuria, frequency and urgency.  Musculoskeletal:  Positive for arthralgias and back pain. Negative for myalgias.  Skin:  Negative for pallor and rash.  Allergic/Immunologic: Negative for environmental  allergies.  Neurological:  Negative for dizziness, syncope and headaches.  Hematological:  Negative for adenopathy. Does not bruise/bleed easily.  Psychiatric/Behavioral:  Negative for decreased concentration and dysphoric mood. The patient is not nervous/anxious.        Objective:   Physical Exam Constitutional:      General: She is not in acute distress.    Appearance: Normal appearance. She is well-developed. She is obese. She is not ill-appearing or diaphoretic.  HENT:     Head: Normocephalic and atraumatic.     Right Ear: Tympanic membrane, ear canal and external ear normal.     Left Ear: Tympanic membrane, ear canal and external ear normal.     Nose: Nose normal. No congestion.     Mouth/Throat:     Mouth: Mucous membranes are moist.     Pharynx: Oropharynx is clear. No posterior oropharyngeal erythema.  Eyes:     General: No scleral icterus.    Extraocular Movements: Extraocular movements intact.     Conjunctiva/sclera: Conjunctivae normal.     Pupils: Pupils are equal, round, and reactive to light.  Neck:     Thyroid : No thyromegaly.     Vascular: No carotid bruit or JVD.  Cardiovascular:     Rate and Rhythm: Normal rate and regular rhythm.     Pulses: Normal pulses.     Heart sounds: Normal heart sounds.     No gallop.  Pulmonary:      Effort: Pulmonary effort is normal. No respiratory distress.     Breath sounds: Normal breath sounds. No wheezing.     Comments: Good air exch Chest:     Chest wall: No tenderness.  Abdominal:     General: Bowel sounds are normal. There is no distension or abdominal bruit.     Palpations: Abdomen is soft. There is no mass.     Tenderness: There is no abdominal tenderness.     Hernia: No hernia is present.  Genitourinary:    Comments: Breast and pelvic exam are done by gyn provider   Musculoskeletal:        General: No tenderness. Normal range of motion.     Cervical back: Normal range of motion and neck supple. No rigidity. No muscular tenderness.     Right lower leg: No edema.     Left lower leg: No edema.     Comments: No kyphosis   Lymphadenopathy:     Cervical: No cervical adenopathy.  Skin:    General: Skin is warm and dry.     Coloration: Skin is not pale.     Findings: No erythema or rash.     Comments: Solar lentigines diffusely   Neurological:     Mental Status: She is alert. Mental status is at baseline.     Cranial Nerves: No cranial nerve deficit.     Motor: No abnormal muscle tone.     Coordination: Coordination normal.     Gait: Gait normal.     Deep Tendon Reflexes: Reflexes are normal and symmetric. Reflexes normal.  Psychiatric:        Mood and Affect: Mood normal.        Cognition and Memory: Cognition and memory normal.           Assessment & Plan:   Problem List Items Addressed This Visit       Cardiovascular and Mediastinum   Essential hypertension   bp in fair control at this time  No symptoms BP Readings from  Last 1 Encounters:  03/09/24 118/72   No changes needed Doing well with olmesartan  20 mg daily (dropped from 40)  Tolerates this            Respiratory   Obstructive sleep apnea   Dr Neysa is retiring Pt plans to get set up with pulm in [place] if possible for management Losing weight and doing well with cpap       IPF (idiopathic pulmonary fibrosis) (HCC)   Continues pulmonary care Overall stable and doing well      Allergic asthma, mild intermittent, uncomplicated   Stable Symbicort  80-4.5 mg          Endocrine   Hyperlipidemia associated with type 2 diabetes mellitus (HCC)   Disc goals for lipids and reasons to control them Rev last labs with pt Rev low sat fat diet in detail  Tolerates crestor  10 -apprehensive to increase due to muscle pain  LDL 83 Goal below 70 Reviewed Cardiac ca score   Pt wants to work on diet Re check 3 mo  If not at goal consider zetia-handout given       Diabetes mellitus treated with injections of non-insulin  medication (HCC)   Lab Results  Component Value Date   HGBA1C 5.9 03/02/2024   HGBA1C 5.9 (H) 12/02/2023   HGBA1C 5.9 (H) 07/08/2023   Semaglutide  2 mg weekly  Interested in cgm-will see what brand is covered and call Goal LDL 70 or below Working on weight loss         Other   Vitamin D  deficiency   Last vitamin D  Lab Results  Component Value Date   VD25OH 38.12 03/02/2024   Getting back on vit D oral      Routine general medical examination at a health care facility - Primary   Reviewed health habits including diet and exercise and skin cancer prevention Reviewed appropriate screening tests for age  Also reviewed health mt list, fam hx and immunization status , as well as social and family history   See HPI Labs reviewed and ordered Health Maintenance  Topic Date Due   HIV Screening  Never done   Hepatitis C Screening  Never done   Eye exam for diabetics  11/08/2009   Pneumococcal Vaccine for age over 56 (2 of 2 - PCV) 12/18/2018   Complete foot exam   03/04/2023   Breast Cancer Screening  04/19/2024   COVID-19 Vaccine (9 - 2025-26 season) 04/13/2024   Hemoglobin A1C  08/31/2024   Pap with HPV screening  09/08/2024   Yearly kidney health urinalysis for diabetes  12/01/2024   Yearly kidney function blood test for diabetes   03/02/2025   DTaP/Tdap/Td vaccine (4 - Td or Tdap) 07/20/2028   Colon Cancer Screening  03/09/2029   Flu Shot  Completed   Zoster (Shingles) Vaccine  Completed   Hepatitis B Vaccine  Aged Out   HPV Vaccine  Aged Out   Meningitis B Vaccine  Aged Out    Sent for oph report  Mammogram scheduled -due in dec [facility] gyn care  Discussed fall prevention, supplements and exercise for bone density  PHQ 0       Diastolic dysfunction   Under cardiology care Working on fitness/ and DM / HTN control   No symptoms       Current use of proton pump inhibitor   Last vitamin D  Lab Results  Component Value Date   VD25OH 38.12 03/02/2024   Lab Results  Component Value  Date   VITAMINB12 1,449 (H) 12/02/2023   Will cut back on B12 suppl   Hope will not need ppi after done with glp-1      BMI 30.0-30.9,adult   Discussed how this problem influences overall health and the risks it imposes  Reviewed plan for weight loss with lower calorie diet (via better food choices (lower glycemic and portion control) along with exercise building up to or more than 30 minutes 5 days per week including some aerobic activity and strength training   Continues to go to the healthy weight clinic  Also semaglutide 

## 2024-03-09 NOTE — Assessment & Plan Note (Signed)
 Under cardiology care Working on fitness/ and DM / HTN control   No symptoms

## 2024-03-09 NOTE — Assessment & Plan Note (Signed)
 Last vitamin D  Lab Results  Component Value Date   VD25OH 38.12 03/02/2024   Lab Results  Component Value Date   VITAMINB12 1,449 (H) 12/02/2023   Will cut back on B12 suppl   Hope will not need ppi after done with glp-1

## 2024-03-09 NOTE — Assessment & Plan Note (Signed)
 bp in fair control at this time  No symptoms BP Readings from Last 1 Encounters:  03/09/24 118/72   No changes needed Doing well with olmesartan  20 mg daily (dropped from 40)  Tolerates this

## 2024-03-17 DIAGNOSIS — E119 Type 2 diabetes mellitus without complications: Secondary | ICD-10-CM | POA: Diagnosis not present

## 2024-03-21 ENCOUNTER — Ambulatory Visit (INDEPENDENT_AMBULATORY_CARE_PROVIDER_SITE_OTHER): Payer: Self-pay | Admitting: Family Medicine

## 2024-03-21 ENCOUNTER — Encounter (INDEPENDENT_AMBULATORY_CARE_PROVIDER_SITE_OTHER): Payer: Self-pay | Admitting: Family Medicine

## 2024-03-21 VITALS — BP 103/67 | HR 59 | Temp 97.7°F | Ht 65.0 in | Wt 187.0 lb

## 2024-03-21 DIAGNOSIS — Z6831 Body mass index (BMI) 31.0-31.9, adult: Secondary | ICD-10-CM

## 2024-03-21 DIAGNOSIS — E119 Type 2 diabetes mellitus without complications: Secondary | ICD-10-CM | POA: Diagnosis not present

## 2024-03-21 DIAGNOSIS — E559 Vitamin D deficiency, unspecified: Secondary | ICD-10-CM

## 2024-03-21 DIAGNOSIS — Z7985 Long-term (current) use of injectable non-insulin antidiabetic drugs: Secondary | ICD-10-CM

## 2024-03-21 DIAGNOSIS — I447 Left bundle-branch block, unspecified: Secondary | ICD-10-CM | POA: Diagnosis not present

## 2024-03-21 DIAGNOSIS — I5189 Other ill-defined heart diseases: Secondary | ICD-10-CM | POA: Diagnosis not present

## 2024-03-21 DIAGNOSIS — E669 Obesity, unspecified: Secondary | ICD-10-CM

## 2024-03-21 DIAGNOSIS — E1169 Type 2 diabetes mellitus with other specified complication: Secondary | ICD-10-CM

## 2024-03-21 MED ORDER — OZEMPIC (2 MG/DOSE) 8 MG/3ML ~~LOC~~ SOPN: 2.0000 mg | PEN_INJECTOR | SUBCUTANEOUS | 0 refills | Status: DC

## 2024-03-21 NOTE — Progress Notes (Signed)
 Office: [phone]  /  Fax: [phone]  WEIGHT SUMMARY AND BIOMETRICS  Anthropometric Measurements Height: 5' 5 (1.651 m) Weight: 187 lb (84.8 kg) BMI (Calculated): 31.12 Weight at Last Visit: 186 lb Weight Lost Since Last Visit: 0 Weight Gained Since Last Visit: 1 lb Starting Weight: 229 lb Total Weight Loss (lbs): 42 lb (19.1 kg) Peak Weight: 232 lb   Body Composition  Body Fat %: 41.7 % Fat Mass (lbs): 78.4 lbs Muscle Mass (lbs): 103.8 lbs Total Body Water (lbs): 77.4 lbs Visceral Fat Rating : 11   Other Clinical Data RMR: 1210 Fasting: no Labs: no Today's Visit #: 40 Starting Date: 06/27/20    Chief Complaint: OBESITY   History of Present Illness Patty Williams is a 63 year old female with obesity and type 2 diabetes who presents for obesity treatment and progress assessment.  She follows the category two eating plan about sixty percent of the time, focusing on increasing her intake of fruits and vegetables while consuming the recommended amount of protein. However, she has inadequate hydration. She does not skip meals and typically gets seven to nine hours of sleep most nights. Her exercise routine consists primarily of cardio for twenty minutes, three to four days a week. She has gained one pound since her last visit a month ago.  She is currently on Ozempic  2 mg for her type 2 diabetes and has been stable on this dose. Her most recent hemoglobin A1c was well controlled at 5.9. She requests a refill of her Ozempic .  She has been exploring preventive cardiology measures. A scan of her liver, which showed fatty liver, also noted something in her heart. A cardiac score was done, revealing some plaque, and she was found to have diastolic dysfunction, grade one, and a left bundle branch block (LBBB) since 2022. She is in the 75th percentile for blood vessel health. She has no cardiac symptoms and is consulting a nutritionist to decrease inflammation, taking vitamin  K2 as part of her regimen.  She is planning to visit her brother for Thanksgiving and intends to manage her diet by taking Fairlife protein daily and coordinating with her sister-in-law to ensure protein-rich meals. She is aware of the challenges of logging food intake during travel.  She is considering retirement and may continue some professional engagement.      PHYSICAL EXAM:  Blood pressure 103/67, pulse (!) 59, temperature 97.7 F (36.5 C), height 5' 5 (1.651 m), weight 187 lb (84.8 kg), last menstrual period 10/08/2018, SpO2 97%. Body mass index is 31.12 kg/m.  DIAGNOSTIC DATA REVIEWED:  BMET    Component Value Date/Time   NA 141 03/02/2024 0830   NA 147 (H) 02/03/2024 0926   K 4.2 03/02/2024 0830   CL 104 03/02/2024 0830   CO2 31 03/02/2024 0830   GLUCOSE 95 03/02/2024 0830   BUN 17 03/02/2024 0830   BUN 20 02/03/2024 0926   CREATININE 0.58 03/02/2024 0830   CALCIUM  8.8 03/02/2024 0830   GFRNONAA 98 06/27/2020 0840   GFRAA 113 06/27/2020 0840   Lab Results  Component Value Date   HGBA1C 5.9 03/02/2024   HGBA1C 6.5 (H) 03/02/2008   Lab Results  Component Value Date   INSULIN  13.8 12/02/2023   INSULIN  19.9 06/27/2020   Lab Results  Component Value Date   TSH 1.87 03/02/2024   CBC    Component Value Date/Time   WBC 5.3 03/02/2024 0830   RBC 4.30 03/02/2024 0830   HGB 12.6 03/02/2024  0830   HGB 12.5 12/02/2023 1040   HCT 38.9 03/02/2024 0830   HCT 38.1 12/02/2023 1040   PLT 244.0 03/02/2024 0830   PLT 274 12/02/2023 1040   MCV 90.4 03/02/2024 0830   MCV 92 12/02/2023 1040   MCH 30.2 12/02/2023 1040   MCH 28.9 09/23/2018 0404   MCHC 32.4 03/02/2024 0830   RDW 14.3 03/02/2024 0830   RDW 13.3 12/02/2023 1040   Iron Studies    Component Value Date/Time   IRON 92 07/08/2023 1006   TIBC 382 07/08/2023 1006   FERRITIN 24.4 03/02/2024 0830   FERRITIN 40 07/08/2023 1006   IRONPCTSAT 24 07/08/2023 1006   Lipid Panel     Component Value Date/Time    CHOL 144 03/02/2024 0830   CHOL 138 12/02/2023 1040   TRIG 63.0 03/02/2024 0830   HDL 48.80 03/02/2024 0830   HDL 45 12/02/2023 1040   CHOLHDL 3 03/02/2024 0830   VLDL 12.6 03/02/2024 0830   LDLCALC 83 03/02/2024 0830   LDLCALC 82 12/02/2023 1040   LDLDIRECT 144.2 09/21/2008 1008   Hepatic Function Panel     Component Value Date/Time   PROT 6.6 03/02/2024 0830   PROT 6.6 12/02/2023 1040   ALBUMIN 4.1 03/02/2024 0830   ALBUMIN 4.2 12/02/2023 1040   AST 22 03/02/2024 0830   ALT 26 03/02/2024 0830   ALKPHOS 62 03/02/2024 0830   BILITOT 0.5 03/02/2024 0830   BILITOT 0.4 12/02/2023 1040   BILIDIR 0.1 03/02/2008 1018      Component Value Date/Time   TSH 1.87 03/02/2024 0830   Nutritional Lab Results  Component Value Date   VD25OH 38.12 03/02/2024   VD25OH 108.0 (H) 12/02/2023   VD25OH 67.2 07/08/2023     Assessment and Plan Assessment & Plan Obesity management Following category two eating plan 60% of the time, working on increasing fruits and vegetables, adequate protein intake, but inadequate hydration. Exercises 20 minutes of cardio 3-4 days a week. Gained 1 pound in the last month. Strategies in place for upcoming holidays to maintain weight. - Continue category two eating plan - Increase hydration - Implement strategies for holiday weight maintenance  Type 2 diabetes mellitus, well controlled Well controlled on Ozempic  2 mg with recent hemoglobin A1c at 5.9. Interested in continuous glucose monitoring but insurance may not cover it due to not being on insulin . Discussed alternative methods for monitoring blood glucose levels. - Refilled Ozempic  2 mg - Consider manual blood glucose monitoring every two hours if needed  Left bundle branch block and grade 1 diastolic dysfunction Left bundle branch block noted since 2022, grade 1 diastolic dysfunction likely age-related. No cardiac symptoms reported. Cardiologist advised monitoring and informing emergency personnel  of LBBB. - Continue follow-up with cardiologist every six months - Inform emergency personnel of LBBB if needed  Vitamin D  deficiency,  Vitamin D  levels previously high, now at lower end of normal. Resumed vitamin D  supplementation at 2000 IU daily. - Continue vitamin D  supplementation at 2000 IU daily - Will recheck vitamin D  levels in January     I personally spent a total of 40 minutes in the care of the patient today including preparing to see the patient, reviewing separately obtained history, performing a medically appropriate evaluation of current problems, placing orders in the EMR, documenting clinical information in the EMR, and customized nutritional counseling for their specific health and social needs.  Fumiko was counseled on the importance of maintaining healthy lifestyle habits, including balanced nutrition, regular physical  activity, and behavioral modifications, while taking antiobesity medication.  Patient verbalized understanding that medication is an adjunct to, not a replacement for, lifestyle changes and that the long-term success and weight maintenance depend on continued adherence to these strategies.   [place] was informed of the importance of frequent follow up visits to maximize her success with intensive lifestyle modifications for her obesity and obesity related health conditions as recommended by USPSTF and CMS guidelines   Louann Penton, MD

## 2024-03-27 ENCOUNTER — Encounter: Payer: Self-pay | Admitting: Internal Medicine

## 2024-03-27 NOTE — Telephone Encounter (Signed)
 Dr Jess is at our [place] office. I think she can help you.

## 2024-04-17 ENCOUNTER — Encounter: Payer: Self-pay | Admitting: Family Medicine

## 2024-04-17 DIAGNOSIS — K5792 Diverticulitis of intestine, part unspecified, without perforation or abscess without bleeding: Secondary | ICD-10-CM | POA: Diagnosis not present

## 2024-04-17 DIAGNOSIS — E119 Type 2 diabetes mellitus without complications: Secondary | ICD-10-CM | POA: Diagnosis not present

## 2024-04-18 ENCOUNTER — Encounter: Payer: Self-pay | Admitting: Sleep Medicine

## 2024-04-18 ENCOUNTER — Ambulatory Visit (INDEPENDENT_AMBULATORY_CARE_PROVIDER_SITE_OTHER): Payer: Self-pay | Admitting: Family Medicine

## 2024-04-18 ENCOUNTER — Ambulatory Visit: Admitting: Sleep Medicine

## 2024-04-18 VITALS — BP 118/80 | HR 74 | Temp 98.5°F | Ht 65.0 in | Wt 195.0 lb

## 2024-04-18 DIAGNOSIS — I10 Essential (primary) hypertension: Secondary | ICD-10-CM | POA: Diagnosis not present

## 2024-04-18 DIAGNOSIS — G4733 Obstructive sleep apnea (adult) (pediatric): Secondary | ICD-10-CM | POA: Diagnosis not present

## 2024-04-18 NOTE — Progress Notes (Signed)
 Name:Patty Williams MRN: [id number] DOB: 1961/01/21   CHIEF COMPLAINT:  CPAP F/U   HISTORY OF PRESENT ILLNESS: Patty Williams is a 63 y.o. w/ a h/o OSA, HTN, GERD, DMII and obesity who presents for CPAP follow up visit. Reports using CPAP therapy every night, which is confirmed by compliance data. She is currently using the Airfit F20 FFM, which is comfortable. Reports feeling refreshed upon awakening with CPAP therapy.    EPWORTH SLEEP SCORE    01/13/2019   10:00 AM  Results of the Epworth flowsheet  Sitting and reading 3  Watching TV 2  Sitting, inactive in a public place (e.g. a theatre or a meeting) 1  As a passenger in a car for an hour without a break 2  Lying down to rest in the afternoon when circumstances permit 3  Sitting and talking to someone 0  Sitting quietly after a lunch without alcohol 0  In a car, while stopped for a few minutes in traffic 0  Total score 11    PAST MEDICAL HISTORY :   has a past medical history of Anxiety, Asthma, Back pain, Bilateral ovarian cysts, Bruxism, Constipation, Diabetes mellitus type II, Diverticulosis (2014), Dry mouth, Elevated blood pressure reading without diagnosis of hypertension, Endometriosis, Fibroids, GERD (gastroesophageal reflux disease) (0), History of ovarian cyst, HLD (hyperlipidemia), adenomatous polyp of colon (03/24/2019), Hyperglycemia, Hyperlipidemia, Hypertension, Iron deficiency anemia, Joint pain, Menorrhagia, Obesity, Obesity, OSA on CPAP, Plantar fasciitis, PMB (postmenopausal bleeding), Pneumonia, PONV (postoperative nausea and vomiting), Pre-diabetes, Rectovaginal fistula, Sleep apnea, Uterine adhesion, Uterine fibroid, and Wears glasses.  has a past surgical history that includes Hemorrhoid surgery; Uterine suspension; laparoscopy; Colonoscopy (01/2009); Upper gi endoscopy (01/2009); Dilatation & curettage/hysteroscopy with myosure (N/A, 09/22/2018); Wisdom tooth extraction; Tonsillectomy and adenoidectomy;  uterine cyst exploration; Trigger finger release; uterine biopsy; Nasal septoplasty w/ turbinoplasty (Bilateral, 09/02/2021); Septoplasty; and Hemorrhoid banding. Prior to Admission medications   Medication Sig Start Date End Date Taking? Authorizing Provider  albuterol  (VENTOLIN  HFA) 108 (90 Base) MCG/ACT inhaler Inhale 2 puffs into the lungs every 4 (four) hours as needed. 04/14/23  Yes Tower, Laine LABOR, MD  AMBULATORY NON FORMULARY MEDICATION Single glucometer with lancets, test strips-Check FBS and 2 hour PP(BID) 06/23/22  Yes Opalski, Deborah, DO  budesonide -formoterol  (SYMBICORT ) 80-4.5 MCG/ACT inhaler USE 2 INHALATIONS TWICE A DAY 08/10/23  Yes Tower, Marne A, MD  Coenzyme Q10 (CO Q 10 PO) Take 300 mg by mouth daily.   Yes [provider]  docusate sodium (COLACE) 100 MG capsule Take 100 mg by mouth daily as needed for mild constipation.   Yes [provider]  famotidine (PEPCID) 40 MG tablet Take 40 mg by mouth daily.   Yes [provider]  MAGNESIUM GLYCINATE PO Take 240 mg by mouth in the morning and at bedtime.   Yes [provider]  Multiple Vitamin (MULTIVITAMIN) tablet Take 1 tablet by mouth daily.   Yes [provider]  naproxen  (NAPROSYN ) 500 MG tablet Take 1 tablet (500 mg total) by mouth 2 (two) times daily as needed for moderate pain. With a meal 04/30/21  Yes Tower, Laine LABOR, MD  olmesartan  (BENICAR ) 40 MG tablet Take 1 tablet (40 mg total) by mouth daily. 09/20/23  Yes Tower, Laine LABOR, MD  pantoprazole  (PROTONIX ) 40 MG tablet Take 1 tablet (40 mg total) by mouth 2 (two) times daily before a meal. Breakfast and supper 11/30/22  Yes Avram Lupita BRAVO, MD  rosuvastatin  (CRESTOR ) 10 MG tablet  Take 1 tablet (10 mg total) by mouth daily. 07/23/23  Yes Tower, Marne A, MD  Semaglutide , 2 MG/DOSE, (OZEMPIC , 2 MG/DOSE,) 8 MG/3ML SOPN Inject 2 mg into the skin once a week. 03/21/24  Yes Verdon Parry D, MD   No Known Allergies  FAMILY HISTORY:  family  history includes Anxiety disorder in her mother; Cancer in her father; Diabetes in her mother; Emphysema in her paternal grandfather; Heart attack (age of onset: 26 - 69) in her maternal grandfather; Heart disease in her mother; Hyperlipidemia in her mother; Hypertension in her father and mother; Obesity in her father and mother; Parkinson's disease in her paternal grandfather; Prostate cancer (age of onset: 62 - 37) in her father; Pulmonary fibrosis (age of onset: 90) in her mother; Pulmonary fibrosis (age of onset: 81) in her father; Sleep apnea in her mother; Thyroid  disease in her mother. SOCIAL HISTORY:  reports that she has never smoked. She has never used smokeless tobacco. She reports that she does not currently use alcohol. She reports that she does not use drugs.   Review of Systems:  Gen:  Denies  fever, sweats, chills weight loss  HEENT: Denies blurred vision, double vision, ear pain, eye pain, hearing loss, nose bleeds, sore throat Cardiac:  No dizziness, chest pain or heaviness, chest tightness,edema, No JVD Resp:   No cough, -sputum production, -shortness of breath,-wheezing, -hemoptysis,  Gi: Denies swallowing difficulty, stomach pain, nausea or vomiting, diarrhea, constipation, bowel incontinence Gu:  Denies bladder incontinence, burning urine Ext:   Denies Joint pain, stiffness or swelling Skin: Denies  skin rash, easy bruising or bleeding or hives Endoc:  Denies polyuria, polydipsia , polyphagia or weight change Psych:   Denies depression, insomnia or hallucinations  Other:  All other systems negative  VITAL SIGNS: BP 118/80   Pulse 74   Temp 98.5 F (36.9 C)   Ht 5' 5 (1.651 m)   Wt 195 lb (88.5 kg)   LMP 10/08/2018 (Exact Date)   SpO2 99%   BMI 32.45 kg/m    Physical Examination:   General Appearance: No distress  EYES PERRLA, EOM intact.   NECK Supple, No JVD Pulmonary: normal breath sounds, No wheezing.  CardiovascularNormal S1,S2.  No m/r/g.   Abdomen:  Benign, Soft, non-tender. Skin:   warm, no rashes, no ecchymosis  Extremities: normal, no cyanosis, clubbing. Neuro:without focal findings,  speech normal  PSYCHIATRIC: Mood, affect within normal limits.   ASSESSMENT AND PLAN  OSA Patient is using and benefiting from CPAP therapy. Discussed the consequences of untreated sleep apnea. Advised not to drive drowsy for safety of patient and others. Will follow up in 1 year.    HTN Stable, on current management. Following with PCP.    Patient  satisfied with Plan of action and management. All questions answered  I spent a total of 22 minutes reviewing chart data, face-to-face evaluation with the patient, counseling and coordination of care as detailed above.    Wyatt Thorstenson, M.D.  Sleep Medicine [facility]

## 2024-04-18 NOTE — Patient Instructions (Addendum)

## 2024-04-18 NOTE — Telephone Encounter (Signed)
Form printed and placed in your inbox for review  ?

## 2024-04-19 NOTE — Telephone Encounter (Signed)
 Done and in IN box

## 2024-04-21 ENCOUNTER — Other Ambulatory Visit (INDEPENDENT_AMBULATORY_CARE_PROVIDER_SITE_OTHER): Payer: Self-pay | Admitting: Family Medicine

## 2024-04-21 DIAGNOSIS — E1169 Type 2 diabetes mellitus with other specified complication: Secondary | ICD-10-CM

## 2024-04-24 ENCOUNTER — Other Ambulatory Visit: Payer: Self-pay | Admitting: Family Medicine

## 2024-04-24 LAB — HM MAMMOGRAPHY

## 2024-04-27 ENCOUNTER — Other Ambulatory Visit: Payer: Self-pay

## 2024-04-27 ENCOUNTER — Ambulatory Visit: Admitting: Family Medicine

## 2024-04-27 ENCOUNTER — Encounter: Payer: Self-pay | Admitting: Family Medicine

## 2024-04-27 VITALS — BP 130/82 | HR 71 | Ht 65.0 in | Wt 193.0 lb

## 2024-04-27 DIAGNOSIS — M25511 Pain in right shoulder: Secondary | ICD-10-CM

## 2024-04-27 DIAGNOSIS — G8929 Other chronic pain: Secondary | ICD-10-CM | POA: Diagnosis not present

## 2024-04-27 NOTE — Progress Notes (Unsigned)
° °  I, Leotis Batter, CMA acting as a scribe for Artist Lloyd, MD.  Patty Williams is a 63 y.o. female who presents to [facility] today for cont'd R shoulder pain. Pt was last seen by Dr. Lloyd on 10/19/23 and was given a R subacromial steroid injection and taught HEP.  Today, pt reports exacerbation of right shoulder pain and limited ROM. Some relief with last steroid injection. The shoulder continues to feel off. Concerned about possible scar tissue. Limited ROM overhead, trouble scrubbing pots - has to modify a lot of movements to do things. Denies n/t/w.   Also c/o continued lower back pain, initially radiating into the L LE. Sx worse with prolonged standing. Denies n/t/w. Sx tend to improve with rest.    Pertinent review of systems: ***  Relevant historical information: ***   Exam:  LMP 10/08/2018  General: Well Developed, well nourished, and in no acute distress.   MSK: ***    Lab and Radiology Results No results found for this or any previous visit (from the past 72 hours). No results found.     Assessment and Plan: 63 y.o. female with ***   PDMP not reviewed this encounter. No orders of the defined types were placed in this encounter.  No orders of the defined types were placed in this encounter.    Discussed warning signs or symptoms. Please see discharge instructions. Patient expresses understanding.   ***

## 2024-04-27 NOTE — Patient Instructions (Addendum)
Thank you for coming in today.   You received an injection today. Seek immediate medical attention if the joint becomes red, extremely painful, or is oozing fluid.   I've referred you to Physical Therapy.  Let us know if you don't hear from them in one week.   Let me know how it goes.

## 2024-05-01 ENCOUNTER — Other Ambulatory Visit: Payer: Self-pay | Admitting: Medical Genetics

## 2024-05-16 ENCOUNTER — Ambulatory Visit (INDEPENDENT_AMBULATORY_CARE_PROVIDER_SITE_OTHER): Payer: Self-pay | Admitting: Family Medicine

## 2024-05-18 ENCOUNTER — Other Ambulatory Visit
Admission: RE | Admit: 2024-05-18 | Discharge: 2024-05-18 | Disposition: A | Discharge status: Home / Self Care | Payer: Self-pay | Source: Ambulatory Visit | Attending: Medical Genetics | Admitting: Medical Genetics

## 2024-05-26 ENCOUNTER — Encounter: Payer: Self-pay | Admitting: Internal Medicine

## 2024-05-26 NOTE — Telephone Encounter (Signed)
 MR- please advise on pt email. I did let her know that she needs to call medical records for the disc. I will try and set her up for 6mw. I asked her what dates she can do. I printed these forms she attached and placed in your red folder. Thanks!   Dr. 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   FAA definition of current is within the last 90 days. My interpretation is that the case note needs to be current. Since I did not do a walking test I will need that as well. I will also need the CD Rom of any imaging. PFTs were done in July but I don't know if they also included DLCO (required). I will attach the whole correspondence and forms they sent for your records.    Please let me know how to proceed so that everything is done before Feb 28.   Thank you in advance for helping me comply.

## 2024-05-26 NOTE — Telephone Encounter (Signed)
 She needs a spiro/dlco - any location by Jun 09, 2024 She needs a - any location by Jun 09, 2024 Then she needs to see me an APP or me ideally efore Jun 25 2024  - during this visit GAP score can be calculated       Latest Ref Rng & Units 12/06/2023    3:33 PM 01/18/2023    8:03 AM 01/05/2023   11:03 AM 03/27/2022    2:56 PM  PFT Results  FVC-Pre L 3.14  3.29  3.11  3.05   FVC-Predicted Pre % 93  97  92  90   FVC-Post L 3.20  3.19  3.12  2.95   FVC-Predicted Post % 95  94  92  87   Pre FEV1/FVC % % 85  68  39  86   Post FEV1/FCV % % 86  59  50  89   FEV1-Pre L 2.67  2.23  1.20  2.63   FEV1-Predicted Pre % 103  85  46  101   FEV1-Post L 2.77  1.89  1.57  2.64   DLCO uncorrected ml/min/mmHg 19.91  17.51  19.06  18.43   DLCO UNC% % 97  84  91  88   DLCO corrected ml/min/mmHg 20.50  17.51  19.06  18.43   DLCO COR %Predicted % 100  84  91  88   DLVA Predicted % 101  89  97  98   TLC L 5.15  4.86  5.04  4.94   TLC % Predicted % 99  93  96  94   RV % Predicted % 93  73  87  84

## 2024-05-29 LAB — GENECONNECT MOLECULAR SCREEN: Genetic Analysis Overall Interpretation: NEGATIVE

## 2024-05-30 ENCOUNTER — Encounter (INDEPENDENT_AMBULATORY_CARE_PROVIDER_SITE_OTHER): Payer: Self-pay | Admitting: Family Medicine

## 2024-05-30 ENCOUNTER — Ambulatory Visit (INDEPENDENT_AMBULATORY_CARE_PROVIDER_SITE_OTHER): Admitting: Family Medicine

## 2024-05-30 VITALS — BP 118/80 | HR 64 | Temp 98.1°F | Ht 65.0 in | Wt 186.0 lb

## 2024-05-30 DIAGNOSIS — Z6830 Body mass index (BMI) 30.0-30.9, adult: Secondary | ICD-10-CM

## 2024-05-30 DIAGNOSIS — E559 Vitamin D deficiency, unspecified: Secondary | ICD-10-CM | POA: Diagnosis not present

## 2024-05-30 DIAGNOSIS — E66811 Obesity, class 1: Secondary | ICD-10-CM

## 2024-05-30 DIAGNOSIS — Z7985 Long-term (current) use of injectable non-insulin antidiabetic drugs: Secondary | ICD-10-CM | POA: Diagnosis not present

## 2024-05-30 DIAGNOSIS — E1169 Type 2 diabetes mellitus with other specified complication: Secondary | ICD-10-CM | POA: Diagnosis not present

## 2024-05-30 DIAGNOSIS — E669 Obesity, unspecified: Secondary | ICD-10-CM | POA: Diagnosis not present

## 2024-05-30 NOTE — Progress Notes (Signed)
 "  Patty Williams, D.O.  ABFM, ABOM Specializing in Clinical Bariatric Medicine  Office located at: [address] [place], [place]  [address]       FOR THE CHRONIC DISEASE OF OBESITY:  Chief complaint: Obesity Patty Williams is here to discuss her progress with her obesity treatment plan.   History of present illness / Interval history:  Patty Williams is here today for her follow-up office visit.  Since last OV on 03/21/24 with provider: [facility], patient thinks that she is at a stand still.   Recent challenges/ barriers to successful adherence of program recommendations:   had a recent lapse due to vacationing or traveling  Pt has been struggling with:  [x]  meal planning and prepping [x]  exercise []  sleep []  stressors []  mood []  chronic or acute medical conditions-  []  eating out more []  Nothing, doing great  Pt has been working on and improving their:  [x]  meal planning and prepping [x]  exercise []  sleep hygiene []  water intake []  strategies to better manage personal stressors []  strategies to better manage mood []  eating out less []  Nothing particular at this time  When asked by CMA prior to our office visit today, pt states they have been:  - Focused on eating fresh, unprocessed foods? No - Focused on eating lean proteins with each meal?  Yes  - Sleeping 7-9 Hours/ Night?  Yes  - Skipping Meals?  No  - States she is following her healthy eating plan approximately 80 % of the time.   Recent weight loss data history  03/21/24 13:00 05/30/24 11:00   Body Fat % 41.7 % 40.7 %  Muscle Mass (lbs) 103.8 lbs 104.6 lbs  Fat Mass (lbs) 78.4 lbs 75.8 lbs  Total Body Water (lbs) 77.4 lbs 74 lbs  Visceral Fat Rating  11 11     Counseling done on how various foods/ behaviors will affect these numbers and how to maximize weight loss success discussed today in detail based on these findings.  Total lbs lost to date: - 43 lbs Total Fat Mass in lbs lost to date: - 30.4  lbs Total weight loss percentage to date since starting program:  - 18.78 %    Obesity, starting BMI 38.11 Obesity (BMI 30.0-34.9) -current BMI 30.95  Physician directed Nutrition Therapy prescription: She is on keeping a food journal and adhering to recommended goals of 1000 calories and 75+ protein.    [name] is currently in the action stage of change. As such, her goal is to journal.   She has agreed to: continue current reduced-calorie meal plan    Physician directed Behavioral Modification prescription: Evidence-based interventions for healthy behavior change were utilized today including the discussion of small changes and SMART goals.  barriers to successful adherence to behavorial change for wt loss: not enough time  We discussed the following today: continue to work on maintaining a reduced calorie state, getting the recommended amount of protein, incorporating whole foods, making healthy choices, staying well hydrated and practicing mindfulness when eating.    Physician directed Physical Activity prescription: [name] is walking 20-30  minutes 3-4 days per week  barriers to successful adherence to exercise for wt loss: orthopedic problems  [name] has been educated on how exercise will help regulate blood sugar levels and prevent excess fat storage and how changing exercise routine will help avoid adaptation plateaus or training plateaus. Add different exercises  Exercise prescription: She has agreed to think about looking into a personal trainer  Medical Interventions/ Pharmacotherapy  Previously tried medical weight loss medications/ therapies: None  Pharmacotherapy for weight loss: She is currently taking Ozempic  1 mg weekly for medical weight loss.    We discussed various medication options to help [facility] with her weight loss efforts and we both agreed to:  Adequate clinical response to anti-obesity medication, continue current anti-obesity regimen  SPECIFIC  behavorial / nutritional / exercise goals for next office visit:   plans to increase exercise.  Specifically:  increase heart rate to 75% when walking  B) OBESITY RELATED CONDITIONS ADDRESSED TODAY:  Type 2 diabetes mellitus with other specified complication, without long-term current use of insulin  [facility]) Assessment & Plan Lab Results  Component Value Date   HGBA1C 5.9 03/02/2024   HGBA1C 5.9 (H) 12/02/2023   HGBA1C 5.9 (H) 07/08/2023   INSULIN  13.8 12/02/2023   INSULIN  27.3 (H) 07/08/2023   INSULIN  10.3 07/02/2022    T2DM Condition stable, on medical therapy, reviewed previous records, On OzempicPatient states that she wears a CGM and knows what spikes her blood blood sugars. She reports that her lowest grams of protein is 84 and her max has been around 110. After a long discussion with patient about her GLP -1 therapy and her weight loss in terms of her dosage and how much weight she has lost. She has been on the same 1 mg dose since 3/23 and has not maxed out on her dose. Explained to her the risks and benefits and explained the efficacy of the medication and the finite time she has to lose weight. Since patient has extra medication mutually agreed to increase to 2 mg weekly. Explained to patient about switching to Mounjaro  or Wegovy  in the future.     Vitamin D  deficiency Assessment & Plan Lab Results  Component Value Date   VD25OH 38.12 03/02/2024   VD25OH 108.0 (H) 12/02/2023   VD25OH 67.2 07/08/2023   Per last OV patient states that she is taking 2000 lU daily with reported good compliance and tolerance. Stressed to patient the importance of having at goal Vit D levels. Will obtain labs at next OV. Continue with supplementation.     Follow up:   Return 06/27/2024 at 12:20 PM. She was informed of the importance of frequent follow up visits to maximize her success with intensive lifestyle modifications for her multiple health conditions.   Weight Summary and Biometrics    Weight Lost Since Last Visit: 1lb  Weight Gained Since Last Visit: 0lb   Vitals Temp: 98.1 F (36.7 C) BP: 118/80 Pulse Rate: 64 SpO2: 98 %   Anthropometric Measurements Height: 5' 5 (1.651 m) Weight: 186 lb (84.4 kg) BMI (Calculated): 30.95 Weight at Last Visit: 187lb Weight Lost Since Last Visit: 1lb Weight Gained Since Last Visit: 0lb Starting Weight: 229lb Total Weight Loss (lbs): 43 lb (19.5 kg)   Body Composition  Body Fat %: 40.7 % Fat Mass (lbs): 75.8 lbs Muscle Mass (lbs): 104.6 lbs Total Body Water (lbs): 74 lbs Visceral Fat Rating : 11   Other Clinical Data Fasting: No Labs: No Today's Visit #: 48 Starting Date: 06/27/20    Objective:   PHYSICAL EXAM: Blood pressure 118/80, pulse 64, temperature 98.1 F (36.7 C), height 5' 5 (1.651 m), weight 186 lb (84.4 kg), last menstrual period 10/08/2018, SpO2 98%. Body mass index is 30.95 kg/m. General: she is overweight, cooperative and in no acute distress. PSYCH: Has normal mood, affect and thought process.   HEENT: EOMI, sclerae are anicteric. Lungs:  Normal breathing effort, no conversational dyspnea. Extremities: Moves * 4 Neurologic: A and O * 3, good insight  DIAGNOSTIC DATA REVIEWED: BMET    Component Value Date/Time   NA 141 03/02/2024 0830   NA 147 (H) 02/03/2024 0926   K 4.2 03/02/2024 0830   CL 104 03/02/2024 0830   CO2 31 03/02/2024 0830   GLUCOSE 95 03/02/2024 0830   BUN 17 03/02/2024 0830   BUN 20 02/03/2024 0926   CREATININE 0.58 03/02/2024 0830   CALCIUM  8.8 03/02/2024 0830   GFRNONAA 98 06/27/2020 0840   GFRAA 113 06/27/2020 0840   Lab Results  Component Value Date   HGBA1C 5.9 03/02/2024   HGBA1C 6.5 (H) 03/02/2008   Lab Results  Component Value Date   INSULIN  13.8 12/02/2023   INSULIN  19.9 06/27/2020   Lab Results  Component Value Date   TSH 1.87 03/02/2024   CBC    Component Value Date/Time   WBC 5.3 03/02/2024 0830   RBC 4.30 03/02/2024 0830   HGB  12.6 03/02/2024 0830   HGB 12.5 12/02/2023 1040   HCT 38.9 03/02/2024 0830   HCT 38.1 12/02/2023 1040   PLT 244.0 03/02/2024 0830   PLT 274 12/02/2023 1040   MCV 90.4 03/02/2024 0830   MCV 92 12/02/2023 1040   MCH 30.2 12/02/2023 1040   MCH 28.9 09/23/2018 0404   MCHC 32.4 03/02/2024 0830   RDW 14.3 03/02/2024 0830   RDW 13.3 12/02/2023 1040   Iron Studies    Component Value Date/Time   IRON 92 07/08/2023 1006   TIBC 382 07/08/2023 1006   FERRITIN 24.4 03/02/2024 0830   FERRITIN 40 07/08/2023 1006   IRONPCTSAT 24 07/08/2023 1006   Lipid Panel     Component Value Date/Time   CHOL 144 03/02/2024 0830   CHOL 138 12/02/2023 1040   TRIG 63.0 03/02/2024 0830   HDL 48.80 03/02/2024 0830   HDL 45 12/02/2023 1040   CHOLHDL 3 03/02/2024 0830   VLDL 12.6 03/02/2024 0830   LDLCALC 83 03/02/2024 0830   LDLCALC 82 12/02/2023 1040   LDLDIRECT 144.2 09/21/2008 1008   Hepatic Function Panel     Component Value Date/Time   PROT 6.6 03/02/2024 0830   PROT 6.6 12/02/2023 1040   ALBUMIN 4.1 03/02/2024 0830   ALBUMIN 4.2 12/02/2023 1040   AST 22 03/02/2024 0830   ALT 26 03/02/2024 0830   ALKPHOS 62 03/02/2024 0830   BILITOT 0.5 03/02/2024 0830   BILITOT 0.4 12/02/2023 1040   BILIDIR 0.1 03/02/2008 1018      Component Value Date/Time   TSH 1.87 03/02/2024 0830   Nutritional Lab Results  Component Value Date   VD25OH 38.12 03/02/2024   VD25OH 108.0 (H) 12/02/2023   VD25OH 67.2 07/08/2023    Attestations:   LILLETTE Sonny Laroche, acting as a stage manager for Patty Jenkins, DO., have compiled all relevant documentation for today's office visit on behalf of Patty Jenkins, DO, while in the presence of [employer], DO.  I have spent 45 minutes in the care of the patient including:   -   2 minutes before the visit reviewing and preparing the chart.   -   35 minutes face-to-face assessing and reviewing listed medical problems as outlined in obesity care plan, providing  nutritional and behavioral counseling on topics outlined in the obesity care plan, independently interpreting test results and goals of care as described in assessment and plan, and reviewing and discussing biometric information and progress with  obesity treatment plan  -   8 minutes after the visit regarding the EMR documentation of encounter.   I have reviewed the above documentation for accuracy and completeness, and I agree with the above. Patty JINNY Williams, D.O.  The 21st Century Cures Act was signed into law in 2016 which includes the topic of electronic health records.  This provides immediate access to information in MyChart.  This includes consultation notes, operative notes, office notes, lab results and pathology reports.  If you have any questions about what you read please let us  know at your next visit so we can discuss your concerns and take corrective action if need be.  We are right here with you.  "

## 2024-06-02 NOTE — Telephone Encounter (Signed)
 APPt reschedule

## 2024-06-06 NOTE — Telephone Encounter (Signed)
 Yes I would say do in early feb 2026 so we have time to get the [facility] certification done

## 2024-06-07 NOTE — Telephone Encounter (Signed)
CT scheduling

## 2024-06-07 NOTE — Telephone Encounter (Signed)
 Noted to schedule early feb and will call in the morning

## 2024-06-08 ENCOUNTER — Encounter: Payer: Self-pay | Admitting: Internal Medicine

## 2024-06-08 NOTE — Telephone Encounter (Signed)
 Pt is scheduled for Feb 6 at [facility] and she is aware of the appt.

## 2024-06-08 NOTE — Telephone Encounter (Signed)
 Copied from CRM #[id number]. Topic: Clinical - Request for Lab/Test Order >> Jun 02, 2024  8:44 AM Leila BROCKS wrote: Reason for CRM: Patient [phone] needs to make an appointment for a 6 minute walk and Dr. Geronimo before 07/08/24. Per CAL, 6 minute walk at 06/22/24 at 2 pm and 3:15 pm with Dr. Geronimo. Patient states forms for FAA was sent to Dr. Geronimo and needs CT scan results needs to be within the paperwork before 07/08/24, the original instead of March 2026, please change the date to as soon as possible. Patient states will go to [facility] location, but willing to go [place] just to have the CT scan done as soon as possible. Please advise and call back.  -------------------------------------------------------------------------------------------------------------------------------------------- Patient is scheduled for 6 minute walk and OV with Dr. Geronimo on 06/22/24.  Nothing further needed.

## 2024-06-09 LAB — OPHTHALMOLOGY REPORT-SCANNED

## 2024-06-16 ENCOUNTER — Ambulatory Visit: Admission: RE | Admit: 2024-06-16 | Source: Ambulatory Visit

## 2024-06-16 DIAGNOSIS — R7689 Other specified abnormal immunological findings in serum: Secondary | ICD-10-CM

## 2024-06-16 DIAGNOSIS — I2510 Atherosclerotic heart disease of native coronary artery without angina pectoris: Secondary | ICD-10-CM

## 2024-06-16 DIAGNOSIS — Z836 Family history of other diseases of the respiratory system: Secondary | ICD-10-CM

## 2024-06-16 DIAGNOSIS — J849 Interstitial pulmonary disease, unspecified: Secondary | ICD-10-CM

## 2024-06-16 DIAGNOSIS — R911 Solitary pulmonary nodule: Secondary | ICD-10-CM

## 2024-06-22 ENCOUNTER — Ambulatory Visit: Admitting: Internal Medicine

## 2024-06-22 ENCOUNTER — Ambulatory Visit

## 2024-06-27 ENCOUNTER — Ambulatory Visit (INDEPENDENT_AMBULATORY_CARE_PROVIDER_SITE_OTHER): Admitting: Family Medicine

## 2024-07-25 ENCOUNTER — Ambulatory Visit (INDEPENDENT_AMBULATORY_CARE_PROVIDER_SITE_OTHER): Admitting: Family Medicine

## 2024-09-12 ENCOUNTER — Ambulatory Visit: Admitting: Cardiovascular Disease
